# Patient Record
Sex: Female | Born: 1950 | Race: White | Hispanic: No | Marital: Married | Smoking: Never smoker
Health system: Southern US, Community
[De-identification: ages and names within clinical notes are randomized; demographics above are authoritative.]

## PROBLEM LIST (undated history)

## (undated) DIAGNOSIS — E785 Hyperlipidemia, unspecified: Secondary | ICD-10-CM

## (undated) DIAGNOSIS — I10 Essential (primary) hypertension: Secondary | ICD-10-CM

## (undated) DIAGNOSIS — E119 Type 2 diabetes mellitus without complications: Secondary | ICD-10-CM

## (undated) DIAGNOSIS — G629 Polyneuropathy, unspecified: Secondary | ICD-10-CM

## (undated) DIAGNOSIS — D649 Anemia, unspecified: Secondary | ICD-10-CM

## (undated) HISTORY — PX: ABDOMINAL HYSTERECTOMY: SHX81

---

## 2018-06-07 ENCOUNTER — Other Ambulatory Visit: Payer: Self-pay

## 2018-06-07 ENCOUNTER — Emergency Department (HOSPITAL_COMMUNITY): Payer: Medicare Other

## 2018-06-07 ENCOUNTER — Inpatient Hospital Stay (HOSPITAL_COMMUNITY)
Admission: EM | Admit: 2018-06-07 | Discharge: 2018-06-09 | DRG: 202 | Disposition: A | Discharge status: Home / Self Care | Payer: Medicare Other | Attending: Internal Medicine | Admitting: Internal Medicine

## 2018-06-07 ENCOUNTER — Encounter (HOSPITAL_COMMUNITY): Payer: Self-pay | Admitting: Emergency Medicine

## 2018-06-07 DIAGNOSIS — Z882 Allergy status to sulfonamides status: Secondary | ICD-10-CM

## 2018-06-07 DIAGNOSIS — E1165 Type 2 diabetes mellitus with hyperglycemia: Secondary | ICD-10-CM | POA: Diagnosis present

## 2018-06-07 DIAGNOSIS — D509 Iron deficiency anemia, unspecified: Secondary | ICD-10-CM | POA: Diagnosis not present

## 2018-06-07 DIAGNOSIS — Z79899 Other long term (current) drug therapy: Secondary | ICD-10-CM | POA: Diagnosis not present

## 2018-06-07 DIAGNOSIS — J205 Acute bronchitis due to respiratory syncytial virus: Secondary | ICD-10-CM | POA: Diagnosis present

## 2018-06-07 DIAGNOSIS — Z7984 Long term (current) use of oral hypoglycemic drugs: Secondary | ICD-10-CM | POA: Diagnosis not present

## 2018-06-07 DIAGNOSIS — E785 Hyperlipidemia, unspecified: Secondary | ICD-10-CM | POA: Diagnosis not present

## 2018-06-07 DIAGNOSIS — D649 Anemia, unspecified: Secondary | ICD-10-CM

## 2018-06-07 DIAGNOSIS — E1169 Type 2 diabetes mellitus with other specified complication: Secondary | ICD-10-CM | POA: Diagnosis not present

## 2018-06-07 DIAGNOSIS — Z6834 Body mass index (BMI) 34.0-34.9, adult: Secondary | ICD-10-CM | POA: Diagnosis not present

## 2018-06-07 DIAGNOSIS — R0902 Hypoxemia: Secondary | ICD-10-CM

## 2018-06-07 DIAGNOSIS — E538 Deficiency of other specified B group vitamins: Secondary | ICD-10-CM | POA: Diagnosis present

## 2018-06-07 DIAGNOSIS — J9811 Atelectasis: Secondary | ICD-10-CM | POA: Diagnosis present

## 2018-06-07 DIAGNOSIS — J209 Acute bronchitis, unspecified: Secondary | ICD-10-CM

## 2018-06-07 DIAGNOSIS — E669 Obesity, unspecified: Secondary | ICD-10-CM | POA: Diagnosis not present

## 2018-06-07 DIAGNOSIS — J9601 Acute respiratory failure with hypoxia: Secondary | ICD-10-CM | POA: Diagnosis present

## 2018-06-07 DIAGNOSIS — Z7982 Long term (current) use of aspirin: Secondary | ICD-10-CM

## 2018-06-07 DIAGNOSIS — I10 Essential (primary) hypertension: Secondary | ICD-10-CM

## 2018-06-07 DIAGNOSIS — J40 Bronchitis, not specified as acute or chronic: Secondary | ICD-10-CM

## 2018-06-07 HISTORY — DX: Hyperlipidemia, unspecified: E78.5

## 2018-06-07 HISTORY — DX: Type 2 diabetes mellitus without complications: E11.9

## 2018-06-07 HISTORY — DX: Polyneuropathy, unspecified: G62.9

## 2018-06-07 HISTORY — DX: Essential (primary) hypertension: I10

## 2018-06-07 HISTORY — DX: Anemia, unspecified: D64.9

## 2018-06-07 LAB — CBC WITH DIFFERENTIAL/PLATELET
Abs Immature Granulocytes: 0.08 10*3/uL — ABNORMAL HIGH (ref 0.00–0.07)
Basophils Absolute: 0 10*3/uL (ref 0.0–0.1)
Basophils Relative: 0 %
Eosinophils Absolute: 0 10*3/uL (ref 0.0–0.5)
Eosinophils Relative: 0 %
HCT: 24.5 % — ABNORMAL LOW (ref 36.0–46.0)
Hemoglobin: 7.6 g/dL — ABNORMAL LOW (ref 12.0–15.0)
Immature Granulocytes: 3 %
Lymphocytes Relative: 28 %
Lymphs Abs: 0.9 10*3/uL (ref 0.7–4.0)
MCH: 35.5 pg — ABNORMAL HIGH (ref 26.0–34.0)
MCHC: 31 g/dL (ref 30.0–36.0)
MCV: 114.5 fL — ABNORMAL HIGH (ref 80.0–100.0)
Monocytes Absolute: 0.2 10*3/uL (ref 0.1–1.0)
Monocytes Relative: 5 %
Neutro Abs: 2.1 10*3/uL (ref 1.7–7.7)
Neutrophils Relative %: 64 %
Platelets: 151 10*3/uL (ref 150–400)
RBC: 2.14 MIL/uL — ABNORMAL LOW (ref 3.87–5.11)
RDW: 17.8 % — ABNORMAL HIGH (ref 11.5–15.5)
WBC: 3.2 10*3/uL — ABNORMAL LOW (ref 4.0–10.5)
nRBC: 0 % (ref 0.0–0.2)

## 2018-06-07 LAB — RETICULOCYTES
Immature Retic Fract: 21.7 % — ABNORMAL HIGH (ref 2.3–15.9)
RBC.: 2.14 MIL/uL — ABNORMAL LOW (ref 3.87–5.11)
RETIC CT PCT: 0.5 % (ref 0.4–3.1)
Retic Count, Absolute: 10.9 10*3/uL — ABNORMAL LOW (ref 19.0–186.0)

## 2018-06-07 LAB — IRON AND TIBC
Iron: 29 ug/dL (ref 28–170)
Saturation Ratios: 11 % (ref 10.4–31.8)
TIBC: 254 ug/dL (ref 250–450)
UIBC: 225 ug/dL

## 2018-06-07 LAB — BASIC METABOLIC PANEL
Anion gap: 13 (ref 5–15)
BUN: 23 mg/dL (ref 8–23)
CO2: 22 mmol/L (ref 22–32)
Calcium: 8.7 mg/dL — ABNORMAL LOW (ref 8.9–10.3)
Chloride: 96 mmol/L — ABNORMAL LOW (ref 98–111)
Creatinine, Ser: 0.76 mg/dL (ref 0.44–1.00)
GFR calc Af Amer: >60 mL/min (ref >60)
GFR calc non Af Amer: >60 mL/min (ref >60)
Glucose, Bld: 216 mg/dL — ABNORMAL HIGH (ref 70–99)
Potassium: 4.2 mmol/L (ref 3.5–5.1)
Sodium: 131 mmol/L — ABNORMAL LOW (ref 135–145)

## 2018-06-07 LAB — FERRITIN: Ferritin: 178 ng/mL (ref 11–307)

## 2018-06-07 LAB — GLUCOSE, CAPILLARY
Glucose-Capillary: 235 mg/dL — ABNORMAL HIGH (ref 70–99)
Glucose-Capillary: 233 mg/dL — ABNORMAL HIGH (ref 70–99)

## 2018-06-07 LAB — TROPONIN I: Troponin I: <0.03 ng/mL (ref <0.03)

## 2018-06-07 LAB — INFLUENZA PANEL BY PCR (TYPE A & B)
Influenza A By PCR: NEGATIVE
Influenza B By PCR: NEGATIVE

## 2018-06-07 LAB — HEMOGLOBIN A1C
Hgb A1c MFr Bld: 8.5 % — ABNORMAL HIGH (ref 4.8–5.6)
Mean Plasma Glucose: 197.25 mg/dL

## 2018-06-07 LAB — FOLATE: Folate: 20.5 ng/mL (ref >5.9)

## 2018-06-07 LAB — POC OCCULT BLOOD, ED: Fecal Occult Bld: NEGATIVE

## 2018-06-07 LAB — VITAMIN B12: Vitamin B-12: 7 pg/mL — ABNORMAL LOW (ref 180–914)

## 2018-06-07 MED ORDER — HYDROCOD POLST-CPM POLST ER 10-8 MG/5ML PO SUER
5.0000 mL | Freq: Two times a day (BID) | ORAL | Status: DC | PRN
  Administered 2018-06-08: 5 mL via ORAL
  Filled 2018-06-07: qty 5

## 2018-06-07 MED ORDER — SODIUM CHLORIDE 0.9 % IV SOLN
INTRAVENOUS | Status: DC
  Administered 2018-06-07 – 2018-06-08 (×2): via INTRAVENOUS

## 2018-06-07 MED ORDER — ONDANSETRON HCL 4 MG/2ML IJ SOLN: 4.0000 mg | Freq: Four times a day (QID) | INTRAMUSCULAR | Status: DC | PRN

## 2018-06-07 MED ORDER — ACETAMINOPHEN 325 MG PO TABS: 650.0000 mg | ORAL_TABLET | Freq: Four times a day (QID) | ORAL | Status: DC | PRN

## 2018-06-07 MED ORDER — INSULIN ASPART 100 UNIT/ML ~~LOC~~ SOLN
3.0000 [IU] | Freq: Three times a day (TID) | SUBCUTANEOUS | Status: DC
  Administered 2018-06-07 – 2018-06-09 (×4): 3 [IU] via SUBCUTANEOUS

## 2018-06-07 MED ORDER — INSULIN ASPART 100 UNIT/ML ~~LOC~~ SOLN
0.0000 [IU] | Freq: Three times a day (TID) | SUBCUTANEOUS | Status: DC
  Administered 2018-06-07: 7 [IU] via SUBCUTANEOUS
  Administered 2018-06-08: 15 [IU] via SUBCUTANEOUS
  Administered 2018-06-08: 11 [IU] via SUBCUTANEOUS
  Administered 2018-06-09: 7 [IU] via SUBCUTANEOUS

## 2018-06-07 MED ORDER — ONDANSETRON HCL 4 MG PO TABS: 4.0000 mg | ORAL_TABLET | Freq: Four times a day (QID) | ORAL | Status: DC | PRN

## 2018-06-07 MED ORDER — BUDESONIDE 0.25 MG/2ML IN SUSP
0.2500 mg | Freq: Two times a day (BID) | RESPIRATORY_TRACT | Status: DC
  Administered 2018-06-07 – 2018-06-09 (×4): 0.25 mg via RESPIRATORY_TRACT
  Filled 2018-06-07 (×7): qty 2

## 2018-06-07 MED ORDER — ADULT MULTIVITAMIN W/MINERALS CH
1.0000 | ORAL_TABLET | Freq: Every day | ORAL | Status: DC
  Administered 2018-06-08 – 2018-06-09 (×2): 1 via ORAL
  Filled 2018-06-07 (×2): qty 1

## 2018-06-07 MED ORDER — IPRATROPIUM-ALBUTEROL 0.5-2.5 (3) MG/3ML IN SOLN
3.0000 mL | Freq: Once | RESPIRATORY_TRACT | Status: AC
  Administered 2018-06-07: 3 mL via RESPIRATORY_TRACT
  Filled 2018-06-07: qty 3

## 2018-06-07 MED ORDER — ACETAMINOPHEN 650 MG RE SUPP: 650.0000 mg | Freq: Four times a day (QID) | RECTAL | Status: DC | PRN

## 2018-06-07 MED ORDER — LEVALBUTEROL HCL 0.63 MG/3ML IN NEBU
0.6300 mg | INHALATION_SOLUTION | Freq: Four times a day (QID) | RESPIRATORY_TRACT | Status: DC
  Administered 2018-06-07 – 2018-06-08 (×3): 0.63 mg via RESPIRATORY_TRACT
  Filled 2018-06-07 (×3): qty 3

## 2018-06-07 MED ORDER — METOPROLOL TARTRATE 25 MG PO TABS
25.0000 mg | ORAL_TABLET | Freq: Every day | ORAL | Status: DC
  Administered 2018-06-08 – 2018-06-09 (×2): 25 mg via ORAL
  Filled 2018-06-07 (×2): qty 1

## 2018-06-07 MED ORDER — DM-GUAIFENESIN ER 30-600 MG PO TB12
1.0000 | ORAL_TABLET | Freq: Two times a day (BID) | ORAL | Status: DC
  Administered 2018-06-07 – 2018-06-09 (×5): 1 via ORAL
  Filled 2018-06-07 (×5): qty 1

## 2018-06-07 MED ORDER — ALBUTEROL SULFATE (2.5 MG/3ML) 0.083% IN NEBU
5.0000 mg | INHALATION_SOLUTION | Freq: Once | RESPIRATORY_TRACT | Status: AC
  Administered 2018-06-07: 5 mg via RESPIRATORY_TRACT
  Filled 2018-06-07: qty 6

## 2018-06-07 MED ORDER — LISINOPRIL 10 MG PO TABS
10.0000 mg | ORAL_TABLET | Freq: Every day | ORAL | Status: DC
  Administered 2018-06-08 – 2018-06-09 (×2): 10 mg via ORAL
  Filled 2018-06-07 (×3): qty 1

## 2018-06-07 MED ORDER — INSULIN ASPART 100 UNIT/ML ~~LOC~~ SOLN
0.0000 [IU] | Freq: Every day | SUBCUTANEOUS | Status: DC
  Administered 2018-06-07: 2 [IU] via SUBCUTANEOUS

## 2018-06-07 MED ORDER — METHYLPREDNISOLONE SODIUM SUCC 40 MG IJ SOLR
40.0000 mg | Freq: Two times a day (BID) | INTRAMUSCULAR | Status: DC
  Administered 2018-06-07 – 2018-06-08 (×2): 40 mg via INTRAVENOUS
  Filled 2018-06-07 (×2): qty 1

## 2018-06-07 MED ORDER — ALBUTEROL SULFATE (2.5 MG/3ML) 0.083% IN NEBU
2.5000 mg | INHALATION_SOLUTION | Freq: Once | RESPIRATORY_TRACT | Status: AC
  Administered 2018-06-07: 2.5 mg via RESPIRATORY_TRACT
  Filled 2018-06-07: qty 3

## 2018-06-07 NOTE — ED Notes (Signed)
CNA ambulated pt on room air, O2 sat dropped to 79%. Pt was returned to bed and placed back on 2L via [place]

## 2018-06-07 NOTE — ED Notes (Signed)
RT notified of orders 

## 2018-06-07 NOTE — ED Provider Notes (Signed)
[facility] EMERGENCY DEPARTMENT Provider Note   CSN: [id number] Arrival date & time: 06/07/18  1043     History   Chief Complaint Chief Complaint  Patient presents with  . Cough    HPI Kaitlin Vargas is a 68 y.o. female.  HPI  Pt was seen at 1115. Per pt, c/o gradual onset and worsening of persistent cough for the past 3 to 4 days. Has been associated with generalized body aches, fatigue, N/V, and sinus/nasal congestion. Pt states she was evaluated by her PMD on 06/02/2018, dx bronchitis, rx zpack and prednisone. Pt states she took the medications as prescribed with transient improvement. Denies CP/palpitations, no abd pain, no diarrhea, no black or blood in stools or emesis, no back pain, no rash, no fevers.   Past Medical History:  Diagnosis Date  . Anemia   . Diabetes mellitus without complication ([place])   . Hyperlipidemia   . Hypertension   . Peripheral neuropathy     Patient Active Problem List   Diagnosis Date Noted  . Acute hypoxemic respiratory failure ([place]) 06/07/2018  . Essential hypertension 06/07/2018  . Type 2 diabetes mellitus with hyperlipidemia ([place]) 06/07/2018  . Anemia 06/07/2018  . Acute bronchitis 06/07/2018    Past Surgical History:  Procedure Laterality Date  . ABDOMINAL HYSTERECTOMY       OB History    Gravida  2   Para  2   Term  2   Preterm      AB      Living  2     SAB      TAB      Ectopic      Multiple      Live Births               Home Medications    Prior to Admission medications   Medication Sig Start Date End Date Taking? Authorizing Provider  aspirin 81 MG chewable tablet Chew 81 mg by mouth once.    Yes [provider]  glipiZIDE (GLUCOTROL XL) 10 MG 24 hr tablet Take 10 mg by mouth 2 (two) times daily.  04/28/18  Yes [provider]  lisinopril (PRINIVIL,ZESTRIL) 20 MG tablet Take 10 mg by mouth daily.  02/06/18  Yes [provider]  metFORMIN (GLUCOPHAGE) 1000 MG tablet Take  1,000 mg by mouth 2 (two) times daily with a meal.  07/02/17 07/02/18 Yes [provider]  metoprolol tartrate (LOPRESSOR) 25 MG tablet Take 25 mg by mouth daily.  01/31/18  Yes [provider]  Multiple Vitamin (THERA) TABS Take 1 tablet by mouth daily.    Yes [provider]  pioglitazone (ACTOS) 15 MG tablet Take 15 mg by mouth daily.  04/28/18  Yes [provider]    Family History Family History  Problem Relation Age of Onset  . Cancer Mother   . Cancer Father     Social History Social History   Tobacco Use  . Smoking status: Never Smoker  . Smokeless tobacco: Never Used  Substance Use Topics  . Alcohol use: Never    Frequency: Never  . Drug use: Never     Allergies   Sulfa antibiotics   Review of Systems Review of Systems ROS: Statement: All systems negative except as marked or noted in the HPI; Constitutional: Negative for fever and chills. +generalized body aches, fatigue. ; ; Eyes: Negative for eye pain, redness and discharge. ; ; ENMT: Negative for ear pain, hoarseness, sore throat. +nasal  congestion, sinus pressure. ; ; Cardiovascular: Negative for chest pain, palpitations, diaphoresis, dyspnea and peripheral edema. ; ; Respiratory: +cough. Negative for wheezing and stridor. ; ; Gastrointestinal: Negative for nausea, vomiting, diarrhea, abdominal pain, blood in stool, hematemesis, jaundice and rectal bleeding. . ; ; Genitourinary: Negative for dysuria, flank pain and hematuria. ; ; Musculoskeletal: Negative for back pain and neck pain. Negative for swelling and trauma.; ; Skin: Negative for pruritus, rash, abrasions, blisters, bruising and skin lesion.; ; Neuro: Negative for headache, lightheadedness and neck stiffness. Negative for altered level of consciousness, altered mental status, extremity weakness, paresthesias, involuntary movement, seizure and syncope.       Physical Exam Updated Vital Signs BP 138/61   Pulse (!) 120   Temp  98.2 F (36.8 C) (Oral)   Resp (!) 26   Ht 4\' 10"  (1.473 m)   Wt 73.9 kg   SpO2 90%   BMI 34.07 kg/m   Patient Vitals for the past 24 hrs:  BP Temp Temp src Pulse Resp SpO2 Height Weight  06/07/18 1330 138/61 - - (!) 120 (!) 26 90 % - -  06/07/18 1300 140/72 - - (!) 119 (!) 25 95 % - -  06/07/18 1230 133/68 - - (!) 118 (!) 25 96 % - -  06/07/18 1148 - - - - - 100 % - -  06/07/18 1130 139/71 - - (!) 104 (!) 26 96 % - -  06/07/18 1103 124/65 98.2 F (36.8 C) Oral (!) 114 (!) 26 (!) 80 % 4\' 10"  (1.473 m) 73.9 kg     Physical Exam 1120: Physical examination:  Nursing notes reviewed; Vital signs and O2 SAT reviewed;  Constitutional: Well developed, Well nourished, Well hydrated, In no acute distress; Head:  Normocephalic, atraumatic; Eyes: EOMI, PERRL, No scleral icterus; ENMT: Mouth and pharynx normal, Mucous membranes moist. +edemetous nasal turbinates bilat with clear rhinorrhea. Mouth and pharynx without lesions. No tonsillar exudates. No intra-oral edema. No submandibular or sublingual edema. No hoarse voice, no drooling, no stridor. No pain with manipulation of larynx. No trismus. ; Neck: Supple, Full range of motion, No lymphadenopathy; Cardiovascular: Tachycardic rate and rhythm, No gallop; Respiratory: Breath sounds coarse & equal bilaterally, No wheezes.  Speaking full sentences with ease, Normal respiratory effort/excursion; Chest: Nontender, Movement normal; Abdomen: Soft, Nontender, Nondistended, Normal bowel sounds. Rectal exam performed w/permission of pt and ED RN chaperone present.  Anal tone normal.  Non-tender, soft brown stool in rectal vault, heme neg.  No fissures, no external hemorrhoids, no palp masses.; Genitourinary: No CVA tenderness; Extremities: Peripheral pulses normal, No tenderness, +tr pedal edema bilat. No calf asymmetry.; Neuro: AA&Ox3, Major CN grossly intact.  Speech clear. No gross focal motor or sensory deficits in extremities.; Skin: Color pale, Warm,  Dry.   ED Treatments / Results  Labs (all labs ordered are listed, but only abnormal results are displayed)   EKG EKG Interpretation  Date/Time:  Saturday June 07 2018 11:09:03 EST Ventricular Rate:  109 PR Interval:    QRS Duration: 83 QT Interval:  313 QTC Calculation: 422 R Axis:   -4 Text Interpretation:  Sinus tachycardia Low voltage, precordial leads Baseline wander No old tracing to compare Confirmed by Francine Graven [phone]) on 06/07/2018 11:28:16 AM   Radiology   Procedures Procedures (including critical care time)  Medications Ordered in ED Medications  ipratropium-albuterol (DUONEB) 0.5-2.5 (3) MG/3ML nebulizer solution 3 mL (has no administration in time range)  albuterol (PROVENTIL) (2.5 MG/3ML) 0.083% nebulizer solution 2.5  mg (has no administration in time range)  albuterol (PROVENTIL) (2.5 MG/3ML) 0.083% nebulizer solution 5 mg (5 mg Nebulization Given 06/07/18 1135)  ipratropium-albuterol (DUONEB) 0.5-2.5 (3) MG/3ML nebulizer solution 3 mL (3 mLs Nebulization Given 06/07/18 1135)     Initial Impression / Assessment and Plan / ED Course  I have reviewed the triage vital signs and the nursing notes.  Pertinent labs & imaging results that were available during my care of the patient were reviewed by me and considered in my medical decision making (see chart for details).  MDM Reviewed: previous chart, nursing note and vitals Reviewed previous: labs and ECG Interpretation: labs, ECG and x-ray Total time providing critical care: 30-74 minutes. This excludes time spent performing separately reportable procedures and services. Consults: admitting MD   CRITICAL CARE Performed by: Francine Graven Total critical care time: 35 minutes Critical care time was exclusive of separately billable procedures and treating other patients. Critical care was necessary to treat or prevent imminent or life-threatening deterioration. Critical care was time spent  personally by me on the following activities: development of treatment plan with patient and/or surrogate as well as nursing, discussions with consultants, evaluation of patient's response to treatment, examination of patient, obtaining history from patient or surrogate, ordering and performing treatments and interventions, ordering and review of laboratory studies, ordering and review of radiographic studies, pulse oximetry and re-evaluation of patient's condition.   Results for orders placed or performed during the hospital encounter of 06/07/18  Influenza panel by PCR (type A & B)  Result Value Ref Range   Influenza A By PCR NEGATIVE NEGATIVE   Influenza B By PCR NEGATIVE NEGATIVE  Basic metabolic panel  Result Value Ref Range   Sodium 131 (L) 135 - 145 mmol/L   Potassium 4.2 3.5 - 5.1 mmol/L   Chloride 96 (L) 98 - 111 mmol/L   CO2 22 22 - 32 mmol/L   Glucose, Bld 216 (H) 70 - 99 mg/dL   BUN 23 8 - 23 mg/dL   Creatinine, Ser 0.76 0.44 - 1.00 mg/dL   Calcium 8.7 (L) 8.9 - 10.3 mg/dL   GFR calc non Af Amer >60 >60 mL/min   GFR calc Af Amer >60 >60 mL/min   Anion gap 13 5 - 15  CBC with Differential  Result Value Ref Range   WBC 3.2 (L) 4.0 - 10.5 K/uL   RBC 2.14 (L) 3.87 - 5.11 MIL/uL   Hemoglobin 7.6 (L) 12.0 - 15.0 g/dL   HCT 24.5 (L) 36.0 - 46.0 %   MCV 114.5 (H) 80.0 - 100.0 fL   MCH 35.5 (H) 26.0 - 34.0 pg   MCHC 31.0 30.0 - 36.0 g/dL   RDW 17.8 (H) 11.5 - 15.5 %   Platelets 151 150 - 400 K/uL   nRBC 0.0 0.0 - 0.2 %   Neutrophils Relative % 64 %   Neutro Abs 2.1 1.7 - 7.7 K/uL   Lymphocytes Relative 28 %   Lymphs Abs 0.9 0.7 - 4.0 K/uL   Monocytes Relative 5 %   Monocytes Absolute 0.2 0.1 - 1.0 K/uL   Eosinophils Relative 0 %   Eosinophils Absolute 0.0 0.0 - 0.5 K/uL   Basophils Relative 0 %   Basophils Absolute 0.0 0.0 - 0.1 K/uL   WBC Morphology MACROCYTES SEEN    Immature Granulocytes 3 %   Abs Immature Granulocytes 0.08 (H) 0.00 - 0.07 K/uL  Troponin I - Once   Result Value Ref Range   Troponin  I <0.03 <0.03 ng/mL  POC occult blood, ED  Result Value Ref Range   Fecal Occult Bld NEGATIVE NEGATIVE   Dg Chest 2 View Result Date: 06/07/2018 CLINICAL DATA:  Cough and shortness of breath EXAM: CHEST - 2 VIEW COMPARISON:  None. FINDINGS: There is atelectatic change in the right mid lung and left base regions. There is no edema or consolidation. Heart is borderline enlarged with pulmonary vascularity normal. No adenopathy. There is calcification in the left anterior descending coronary artery. There is degenerative change in the thoracic spine. IMPRESSION: Areas of atelectatic change. No edema or consolidation. Heart borderline enlarged with foci coronary artery calcification. No adenopathy. Electronically Signed   By: Lowella Grip III M.D.   On: 06/07/2018 11:49    Care Everywhere records reviewed:  04/14/2018: H/H = 9.4/27.9 10/09/2017:   H/H = 11.4/34 12/06/2016:  H/H  = 12.1/36.1    1350:  Pt hypoxic on arrival, Sats 80% R/A. O2 2L N/C applied with Sats improving to low-mid 90's and short neb given with improvement of Sats to 95%< while at rest. H/H lower than previous (above) and pt recalls being told by her PMD last year that her "blood count was lower than it had been." Pt does not recall discussing any plans to pursue this further. Pt ambulated very short distance with O2 Sats dropping to 79% R/A, and pt c/o feeling SOB.  While rolling pt to the side to perform rectal exam, pt's HR increased to 120's and Sats again decreased (into 80's R/A). Stool heme negative. Pt placed upright, O2 2L N/C applied with Sats again improving into 90's. 2nd short neb ordered.  Dx and testing d/w pt and family.  Questions answered.  Verb understanding, agreeable to admit.  T/C returned from [facility] Dr. Manuella Ghazi, case discussed, including:  HPI, pertinent PM/SHx, VS/PE, dx testing, ED course and treatment:  Agreeable to admit.      Final Clinical Impressions(s) / ED  Diagnoses   Final diagnoses:  None    ED Discharge Orders    None       Francine Graven, DO 06/11/18 1217

## 2018-06-07 NOTE — H&P (Signed)
History and Physical    Kaitlin Vargas ZHG:[id number] DOB: 05-09-1951 DOA: 06/07/2018  PCP: Collene Schlichter., MD   Patient coming from: Home  Chief Complaint: Cough/congestion/N/V/aches  HPI: Kaitlin Vargas is a 68 y.o. female with medical history significant for type 2 diabetes, dyslipidemia, hypertension, and anemia who presented to the ED with worsening nonproductive cough as well as body aches, nausea, 2 episodes of emesis and worsening fatigue.  She states that her symptoms initially began a little over a week ago at which point her PCP diagnosed her with a bronchitis and gave her a Z-Pak as well as some prednisone which she finished about 5 days ago.  Her symptoms hardly improved and returned about 3 days ago.  She denies any fevers or chills or diarrhea.  No blood in the stool, melena, or hematemesis noted.  She did have a sick contact with similar symptoms which would be her daughter.  She presently denies any aggravating or alleviating factors.   ED Course: Vital signs are stable but do demonstrate sinus tachycardia is confirmed on EKG.  This appears to have started shortly after her breathing treatments that were given in the ED.  Her labs demonstrate a low hemoglobin of 7.6, but stool occult is negative.  Sodium is 131 and glucose is 216.  EKG with sinus tachycardia noted.  Two-view chest x-ray with areas of patchy atelectasis, but no edema or consolidation.  Influenza swab is negative.  She is desaturating on room air to a level of approximately 80% and requires 2 L nasal cannula which probably brings up her oxygenation in the high 90th percentile.  Review of Systems: All others reviewed and otherwise negative.  Past Medical History:  Diagnosis Date  . Anemia   . Diabetes mellitus without complication ([place])   . Hyperlipidemia   . Hypertension   . Peripheral neuropathy     Past Surgical History:  Procedure Laterality Date  . ABDOMINAL HYSTERECTOMY       reports that she has never  smoked. She has never used smokeless tobacco. She reports that she does not drink alcohol or use drugs.  Allergies  Allergen Reactions  . Sulfa Antibiotics Rash    Family History  Problem Relation Age of Onset  . Cancer Mother   . Cancer Father     Prior to Admission medications   Medication Sig Start Date End Date Taking? Authorizing Provider  aspirin 81 MG chewable tablet Chew 81 mg by mouth once.    Yes [provider]  glipiZIDE (GLUCOTROL XL) 10 MG 24 hr tablet Take 10 mg by mouth 2 (two) times daily.  04/28/18  Yes [provider]  lisinopril (PRINIVIL,ZESTRIL) 20 MG tablet Take 10 mg by mouth daily.  02/06/18  Yes [provider]  metFORMIN (GLUCOPHAGE) 1000 MG tablet Take 1,000 mg by mouth 2 (two) times daily with a meal.  07/02/17 07/02/18 Yes [provider]  metoprolol tartrate (LOPRESSOR) 25 MG tablet Take 25 mg by mouth daily.  01/31/18  Yes [provider]  Multiple Vitamin (THERA) TABS Take 1 tablet by mouth daily.    Yes [provider]  pioglitazone (ACTOS) 15 MG tablet Take 15 mg by mouth daily.  04/28/18  Yes [provider]    Physical Exam: Vitals:   06/07/18 1230 06/07/18 1300 06/07/18 1330 06/07/18 1419  BP: 133/68 140/72 138/61   Pulse: (!) 118 (!) 119 (!) 120   Resp: (!) 25 (!) 25 (!) 26   Temp:  TempSrc:      SpO2: 96% 95% 90% 92%  Weight:      Height:        Constitutional: NAD, calm, comfortable currently receiving breathing treatment Vitals:   06/07/18 1230 06/07/18 1300 06/07/18 1330 06/07/18 1419  BP: 133/68 140/72 138/61   Pulse: (!) 118 (!) 119 (!) 120   Resp: (!) 25 (!) 25 (!) 26   Temp:      TempSrc:      SpO2: 96% 95% 90% 92%  Weight:      Height:       Eyes: lids and conjunctivae normal ENMT: Mucous membranes are moist.  Neck: normal, supple Respiratory: clear to auscultation bilaterally. Normal respiratory effort. No accessory muscle use.  Active wheezing noted  bilaterally. Cardiovascular: Regular rate and rhythm, no murmurs. No extremity edema. Abdomen: no tenderness, no distention. Bowel sounds positive.  Musculoskeletal:  No joint deformity upper and lower extremities.   Skin: no rashes, lesions, ulcers.  Psychiatric: Normal judgment and insight. Alert and oriented x 3. Normal mood.   Labs on Admission: I have personally reviewed following labs and imaging studies  CBC: Recent Labs  Lab 06/07/18 1200  WBC 3.2*  NEUTROABS 2.1  HGB 7.6*  HCT 24.5*  MCV 114.5*  PLT 128   Basic Metabolic Panel: Recent Labs  Lab 06/07/18 1200  NA 131*  K 4.2  CL 96*  CO2 22  GLUCOSE 216*  BUN 23  CREATININE 0.76  CALCIUM 8.7*   GFR: Estimated Creatinine Clearance: 56.7 mL/min (by C-G formula based on SCr of 0.76 mg/dL). Liver Function Tests: No results for input(s): AST, ALT, ALKPHOS, BILITOT, PROT, ALBUMIN in the last 168 hours. No results for input(s): LIPASE, AMYLASE in the last 168 hours. No results for input(s): AMMONIA in the last 168 hours. Coagulation Profile: No results for input(s): INR, PROTIME in the last 168 hours. Cardiac Enzymes: Recent Labs  Lab 06/07/18 1200  TROPONINI <0.03   BNP (last 3 results) No results for input(s): PROBNP in the last 8760 hours. HbA1C: No results for input(s): HGBA1C in the last 72 hours. CBG: No results for input(s): GLUCAP in the last 168 hours. Lipid Profile: No results for input(s): CHOL, HDL, LDLCALC, TRIG, CHOLHDL, LDLDIRECT in the last 72 hours. Thyroid Function Tests: No results for input(s): TSH, T4TOTAL, FREET4, T3FREE, THYROIDAB in the last 72 hours. Anemia Panel: No results for input(s): VITAMINB12, FOLATE, FERRITIN, TIBC, IRON, RETICCTPCT in the last 72 hours. Urine analysis: No results found for: COLORURINE, APPEARANCEUR, LABSPEC, PHURINE, GLUCOSEU, HGBUR, BILIRUBINUR, KETONESUR, PROTEINUR, UROBILINOGEN, NITRITE, LEUKOCYTESUR  Radiological Exams on Admission: Dg Chest 2  View  Result Date: 06/07/2018 CLINICAL DATA:  Cough and shortness of breath EXAM: CHEST - 2 VIEW COMPARISON:  None. FINDINGS: There is atelectatic change in the right mid lung and left base regions. There is no edema or consolidation. Heart is borderline enlarged with pulmonary vascularity normal. No adenopathy. There is calcification in the left anterior descending coronary artery. There is degenerative change in the thoracic spine. IMPRESSION: Areas of atelectatic change. No edema or consolidation. Heart borderline enlarged with foci coronary artery calcification. No adenopathy. Electronically Signed   By: Lowella Grip III M.D.   On: 06/07/2018 11:49    EKG: Independently reviewed.  Sinus tachycardia at 109 bpm with low voltage.  Assessment/Plan Principal Problem:   Acute hypoxemic respiratory failure (HCC) Active Problems:   Essential hypertension   Type 2 diabetes mellitus with hyperlipidemia (HCC)   Anemia  Acute bronchitis    1. Acute hypoxemic respiratory failure secondary to atelectasis in the setting of obesity and bronchitis.  Continue to wean oxygen and plan to have patient up in chair with incentive spirometry.  Start IV Solu-Medrol 40 mg twice daily with Pulmicort breathing treatments as well. 2. Acute bronchitis.  This is likely a viral nature with recent sick contact.  Will check respiratory panel and maintain on steroids as noted above as well as breathing treatments and antitussives.  Patient has completed a recent course of antibiotics and does not have an apparent bacterial infection and therefore, will avoid antibiotics.  Will check urine strep pneumonia as well as Legionella. 3. Worsening anemia.  Downtrend has been noted since May of last year where hemoglobin was previously 11.  She has been told by her PCP in the past that she does have iron deficiency anemia, but has never been prescribed iron.  Will check anemia panel and prescribe iron or B12 as needed.  Stool FOBT  is currently negative and therefore, there is no indication of GI bleed at this time.  Avoid home aspirin for now. 4. Intractable nausea and vomiting.  Maintain on IV fluid with Zofran as needed. 5. Type 2 diabetes.  Will avoid oral agents until dietary intake has stabilized.  Will maintain on high sliding scale insulin to manage hyperglycemia especially in the setting of steroid use. 6. Hypertension.  Currently well controlled.  Will resume home lisinopril and metoprolol.   DVT prophylaxis: SCDs Code Status: Full code Family Communication: None at bedside Disposition Plan: Admit to treat hypoxemia and work-up anemia as well as because of bronchitis Consults called: None Admission status: Inpatient, telemetry   [facility] Pager [phone]  If 7PM-7AM, please contact night-coverage [email or web address] Password [email or web address]  06/07/2018, 2:22 PM

## 2018-06-07 NOTE — ED Triage Notes (Signed)
Patient c/o congested, nonproductive cough, body aches, nausea, vomiting, and fatigue. Per patient seen by PCP and was given prednisone and z-pack in which she finished 5 days ago and symptoms where improved but returned 3 days ago. Denies any diarrhea ot fevers. O2 sat 80% on room air.

## 2018-06-08 DIAGNOSIS — J9601 Acute respiratory failure with hypoxia: Secondary | ICD-10-CM | POA: Diagnosis not present

## 2018-06-08 DIAGNOSIS — J205 Acute bronchitis due to respiratory syncytial virus: Secondary | ICD-10-CM | POA: Diagnosis not present

## 2018-06-08 LAB — BASIC METABOLIC PANEL
Anion gap: 9 (ref 5–15)
BUN: 20 mg/dL (ref 8–23)
CHLORIDE: 102 mmol/L (ref 98–111)
CO2: 24 mmol/L (ref 22–32)
Calcium: 8.6 mg/dL — ABNORMAL LOW (ref 8.9–10.3)
Creatinine, Ser: 0.62 mg/dL (ref 0.44–1.00)
GFR calc Af Amer: >60 mL/min (ref >60)
GFR calc non Af Amer: >60 mL/min (ref >60)
Glucose, Bld: 263 mg/dL — ABNORMAL HIGH (ref 70–99)
Potassium: 4.6 mmol/L (ref 3.5–5.1)
Sodium: 135 mmol/L (ref 135–145)

## 2018-06-08 LAB — RESPIRATORY PANEL BY PCR
Adenovirus: NOT DETECTED
Bordetella pertussis: NOT DETECTED
CORONAVIRUS HKU1-RVPPCR: NOT DETECTED
Chlamydophila pneumoniae: NOT DETECTED
Coronavirus 229E: NOT DETECTED
Coronavirus NL63: NOT DETECTED
Coronavirus OC43: NOT DETECTED
Influenza A: NOT DETECTED
Influenza B: NOT DETECTED
Metapneumovirus: NOT DETECTED
Mycoplasma pneumoniae: NOT DETECTED
PARAINFLUENZA VIRUS 3-RVPPCR: NOT DETECTED
Parainfluenza Virus 1: NOT DETECTED
Parainfluenza Virus 2: NOT DETECTED
Parainfluenza Virus 4: NOT DETECTED
Respiratory Syncytial Virus: DETECTED — AB
Rhinovirus / Enterovirus: NOT DETECTED

## 2018-06-08 LAB — CBC
HEMATOCRIT: 20.9 % — AB (ref 36.0–46.0)
Hemoglobin: 6.5 g/dL — CL (ref 12.0–15.0)
MCH: 35.7 pg — ABNORMAL HIGH (ref 26.0–34.0)
MCHC: 31.1 g/dL (ref 30.0–36.0)
MCV: 114.8 fL — ABNORMAL HIGH (ref 80.0–100.0)
NRBC: 0.8 % — AB (ref 0.0–0.2)
Platelets: 123 10*3/uL — ABNORMAL LOW (ref 150–400)
RBC: 1.82 MIL/uL — ABNORMAL LOW (ref 3.87–5.11)
RDW: 18 % — ABNORMAL HIGH (ref 11.5–15.5)
WBC: 2.7 10*3/uL — ABNORMAL LOW (ref 4.0–10.5)

## 2018-06-08 LAB — GLUCOSE, CAPILLARY
Glucose-Capillary: 258 mg/dL — ABNORMAL HIGH (ref 70–99)
Glucose-Capillary: 305 mg/dL — ABNORMAL HIGH (ref 70–99)
Glucose-Capillary: 37 mg/dL — CL (ref 70–99)
Glucose-Capillary: 53 mg/dL — ABNORMAL LOW (ref 70–99)
Glucose-Capillary: 104 mg/dL — ABNORMAL HIGH (ref 70–99)
Glucose-Capillary: 118 mg/dL — ABNORMAL HIGH (ref 70–99)

## 2018-06-08 LAB — ABO/RH: ABO/RH(D): B POS

## 2018-06-08 LAB — PREPARE RBC (CROSSMATCH)

## 2018-06-08 LAB — HEMOGLOBIN AND HEMATOCRIT, BLOOD
HCT: 28 % — ABNORMAL LOW (ref 36.0–46.0)
Hemoglobin: 9.1 g/dL — ABNORMAL LOW (ref 12.0–15.0)

## 2018-06-08 MED ORDER — SODIUM CHLORIDE 0.9% IV SOLUTION
Freq: Once | INTRAVENOUS | Status: AC
  Administered 2018-06-08: 12:00:00 via INTRAVENOUS

## 2018-06-08 MED ORDER — LEVALBUTEROL HCL 0.63 MG/3ML IN NEBU: 0.6300 mg | INHALATION_SOLUTION | Freq: Four times a day (QID) | RESPIRATORY_TRACT | Status: DC | PRN

## 2018-06-08 MED ORDER — PREDNISONE 20 MG PO TABS
40.0000 mg | ORAL_TABLET | Freq: Every day | ORAL | Status: DC
  Administered 2018-06-09: 40 mg via ORAL
  Filled 2018-06-08: qty 2

## 2018-06-08 MED ORDER — CYANOCOBALAMIN 1000 MCG/ML IJ SOLN
1000.0000 ug | Freq: Once | INTRAMUSCULAR | Status: AC
  Administered 2018-06-08: 1000 ug via INTRAMUSCULAR
  Filled 2018-06-08: qty 1

## 2018-06-08 NOTE — Progress Notes (Signed)
RSV positive, notified Dr. Manuella Ghazi. Droplet precautions in place.

## 2018-06-08 NOTE — Progress Notes (Signed)
CRITICAL VALUE ALERT  Critical Value: hemoglobin 6.5  Date & Time Notied:  06/08/18 0710  Provider Notified:Shah  Orders Received/Actions taken: awaiting orders

## 2018-06-08 NOTE — Progress Notes (Signed)
PROGRESS NOTE    Kaitlin Vargas  WUX:[id number] DOB: [birth year] DOA: 06/07/2018 PCP: Collene Schlichter., MD   Brief Narrative:  Per HPI: Kaitlin Vargas is a 68 y.o. female with medical history significant for type 2 diabetes, dyslipidemia, hypertension, and anemia who presented to the ED with worsening nonproductive cough as well as body aches, nausea, 2 episodes of emesis and worsening fatigue.  She states that her symptoms initially began a little over a week ago at which point her PCP diagnosed her with a bronchitis and gave her a Z-Pak as well as some prednisone which she finished about 5 days ago.  Her symptoms hardly improved and returned about 3 days ago.  She denies any fevers or chills or diarrhea.  No blood in the stool, melena, or hematemesis noted.  She did have a sick contact with similar symptoms which would be her daughter.  She presently denies any aggravating or alleviating factors.  Patient was admitted for acute hypoxemic respiratory failure in the setting of acute bronchitis and was also noted to have some worsening anemia with no overt bleeding noted.  She is improving with symptomatic treatment and will require blood transfusion today.  She is noted to have vitamin B12 deficiency.  Assessment & Plan:   Principal Problem:   Acute hypoxemic respiratory failure (HCC) Active Problems:   Essential hypertension   Type 2 diabetes mellitus with hyperlipidemia (HCC)   Anemia   Acute bronchitis  1. Acute hypoxemic respiratory failure secondary to atelectasis in the setting of obesity and bronchitis.  Continue to wean oxygen and plan to have patient up in chair with incentive spirometry.  We will continue Pulmicort, but discontinue IV Solu-Medrol and placed on oral prednisone.  Change breathing treatments to as needed. 2. Acute bronchitis-secondary to RSV.   Continue breathing treatments as needed and antitussives.  Patient has completed a recent course of antibiotics and does not require any  antibiotics at this time. Will check urine strep pneumonia as well as Legionella. 3. Worsening anemia with noted vitamin B12 deficiency.  Downtrend has been noted since May of last year where hemoglobin was previously 11.  She has been told by her PCP in the past that she does have iron deficiency anemia, but has never been prescribed iron.    No significant iron deficiency currently noted or overt bleeding.  Stool occult is negative.  We will plan to replace B12 and transfuse 2 units of PRBCs today and follow CBC in a.m.  Will schedule outpatient GI appointment for endoscopy in the near future. 4. Intractable nausea and vomiting.    This has resolved and patient is tolerating diet.  Zofran will be ongoing as needed and discontinue IV fluid. 5. Type 2 diabetes.  Will avoid oral agents until dietary intake has stabilized.  Will maintain on high sliding scale insulin to manage hyperglycemia especially in the setting of steroid use. 6. Hypertension.  Currently well controlled.  Will continue home lisinopril and metoprolol.  DVT prophylaxis: SCDs Code Status: Full code Family Communication: None at bedside Disposition Plan: Continue to wean oxygen and plan to transfuse PRBC today.  She is noted to have RSV bronchitis.   Consultants:   None  Procedures:   None  Antimicrobials:   None   Subjective: Patient seen and evaluated today with no new acute complaints or concerns. No acute concerns or events noted overnight.  She is overall feeling much better today and denies any overt bleeding.  She has much less  shortness of breath.  Objective: Vitals:   06/08/18 0500 06/08/18 0845 06/08/18 0853 06/08/18 0949  BP: (!) 142/92   135/73  Pulse: (!) 116     Resp: 20     Temp: 97.8 F (36.6 C)     TempSrc: Oral     SpO2: 100% 98% 100%   Weight:      Height:        Intake/Output Summary (Last 24 hours) at 06/08/2018 1206 Last data filed at 06/08/2018 0828 Gross per 24 hour  Intake 1549.3 ml    Output -  Net 1549.3 ml   Filed Weights   06/07/18 1103 06/07/18 1609  Weight: 73.9 kg 72.4 kg    Examination:  General exam: Appears calm and comfortable  Respiratory system: Clear to auscultation. Respiratory effort normal.  On 2 L nasal cannula with very minimal wheezing. Cardiovascular system: S1 & S2 heard, RRR. No JVD, murmurs, rubs, gallops or clicks. No pedal edema. Gastrointestinal system: Abdomen is nondistended, soft and nontender. No organomegaly or masses felt. Normal bowel sounds heard. Central nervous system: Alert and oriented. No focal neurological deficits. Extremities: Symmetric 5 x 5 power. Skin: No rashes, lesions or ulcers Psychiatry: Judgement and insight appear normal. Mood & affect appropriate.     Data Reviewed: I have personally reviewed following labs and imaging studies  CBC: Recent Labs  Lab 06/07/18 1200 06/08/18 0548  WBC 3.2* 2.7*  NEUTROABS 2.1  --   HGB 7.6* 6.5*  HCT 24.5* 20.9*  MCV 114.5* 114.8*  PLT 151 431*   Basic Metabolic Panel: Recent Labs  Lab 06/07/18 1200 06/08/18 0548  NA 131* 135  K 4.2 4.6  CL 96* 102  CO2 22 24  GLUCOSE 216* 263*  BUN 23 20  CREATININE 0.76 0.62  CALCIUM 8.7* 8.6*   GFR: Estimated Creatinine Clearance: 57.5 mL/min (by C-G formula based on SCr of 0.62 mg/dL). Liver Function Tests: No results for input(s): AST, ALT, ALKPHOS, BILITOT, PROT, ALBUMIN in the last 168 hours. No results for input(s): LIPASE, AMYLASE in the last 168 hours. No results for input(s): AMMONIA in the last 168 hours. Coagulation Profile: No results for input(s): INR, PROTIME in the last 168 hours. Cardiac Enzymes: Recent Labs  Lab 06/07/18 1200  TROPONINI <0.03   BNP (last 3 results) No results for input(s): PROBNP in the last 8760 hours. HbA1C: Recent Labs    06/07/18 1209  HGBA1C 8.5*   CBG: Recent Labs  Lab 06/07/18 1641 06/07/18 2045 06/08/18 0738 06/08/18 1115  GLUCAP 235* 233* 258* 305*   Lipid  Profile: No results for input(s): CHOL, HDL, LDLCALC, TRIG, CHOLHDL, LDLDIRECT in the last 72 hours. Thyroid Function Tests: No results for input(s): TSH, T4TOTAL, FREET4, T3FREE, THYROIDAB in the last 72 hours. Anemia Panel: Recent Labs    06/07/18 1209 06/07/18 1525  VITAMINB12 7*  --   FOLATE  --  20.5  FERRITIN 178  --   TIBC 254  --   IRON 29  --   RETICCTPCT 0.5  --    Sepsis Labs: No results for input(s): PROCALCITON, LATICACIDVEN in the last 168 hours.  Recent Results (from the past 240 hour(s))  Respiratory Panel by PCR     Status: Abnormal   Collection Time: 06/07/18  2:36 PM  Result Value Ref Range Status   Adenovirus NOT DETECTED NOT DETECTED Final   Coronavirus 229E NOT DETECTED NOT DETECTED Final   Coronavirus HKU1 NOT DETECTED NOT DETECTED Final   Coronavirus  NL63 NOT DETECTED NOT DETECTED Final   Coronavirus OC43 NOT DETECTED NOT DETECTED Final   Metapneumovirus NOT DETECTED NOT DETECTED Final   Rhinovirus / Enterovirus NOT DETECTED NOT DETECTED Final   Influenza A NOT DETECTED NOT DETECTED Final   Influenza B NOT DETECTED NOT DETECTED Final   Parainfluenza Virus 1 NOT DETECTED NOT DETECTED Final   Parainfluenza Virus 2 NOT DETECTED NOT DETECTED Final   Parainfluenza Virus 3 NOT DETECTED NOT DETECTED Final   Parainfluenza Virus 4 NOT DETECTED NOT DETECTED Final   Respiratory Syncytial Virus DETECTED (A) NOT DETECTED Final    Comment: CRITICAL RESULT CALLED TO, READ BACK BY AND VERIFIED WITH: K. GRAVES,RN 0245 06/08/2018 T. TYSOR    Bordetella pertussis NOT DETECTED NOT DETECTED Final   Chlamydophila pneumoniae NOT DETECTED NOT DETECTED Final   Mycoplasma pneumoniae NOT DETECTED NOT DETECTED Final         Radiology Studies: Dg Chest 2 View  Result Date: 06/07/2018 CLINICAL DATA:  Cough and shortness of breath EXAM: CHEST - 2 VIEW COMPARISON:  None. FINDINGS: There is atelectatic change in the right mid lung and left base regions. There is no edema or  consolidation. Heart is borderline enlarged with pulmonary vascularity normal. No adenopathy. There is calcification in the left anterior descending coronary artery. There is degenerative change in the thoracic spine. IMPRESSION: Areas of atelectatic change. No edema or consolidation. Heart borderline enlarged with foci coronary artery calcification. No adenopathy. Electronically Signed   By: Lowella Grip III M.D.   On: 06/07/2018 11:49        Scheduled Meds: . sodium chloride   Intravenous Once  . budesonide (PULMICORT) nebulizer solution  0.25 mg Nebulization BID  . dextromethorphan-guaiFENesin  1 tablet Oral BID  . insulin aspart  0-20 Units Subcutaneous TID WC  . insulin aspart  0-5 Units Subcutaneous QHS  . insulin aspart  3 Units Subcutaneous TID WC  . lisinopril  10 mg Oral Daily  . metoprolol tartrate  25 mg Oral Daily  . multivitamin with minerals  1 tablet Oral Daily  . [START ON 06/09/2018] predniSONE  40 mg Oral Q breakfast   Continuous Infusions:   LOS: 1 day    Time spent: 30 minutes    Jaedyn Marrufo Darleen Crocker, DO [facility] Pager [phone]  If 7PM-7AM, please contact night-coverage [email or web address] Password [facility] 06/08/2018, 12:06 PM

## 2018-06-09 DIAGNOSIS — J9601 Acute respiratory failure with hypoxia: Secondary | ICD-10-CM | POA: Diagnosis not present

## 2018-06-09 DIAGNOSIS — J205 Acute bronchitis due to respiratory syncytial virus: Secondary | ICD-10-CM | POA: Diagnosis not present

## 2018-06-09 LAB — TYPE AND SCREEN
ABO/RH(D): B POS
Antibody Screen: NEGATIVE
Unit division: 0
Unit division: 0

## 2018-06-09 LAB — CBC
HCT: 33.4 % — ABNORMAL LOW (ref 36.0–46.0)
Hemoglobin: 10.7 g/dL — ABNORMAL LOW (ref 12.0–15.0)
MCH: 33.4 pg (ref 26.0–34.0)
MCHC: 32 g/dL (ref 30.0–36.0)
MCV: 104.4 fL — ABNORMAL HIGH (ref 80.0–100.0)
Platelets: 149 10*3/uL — ABNORMAL LOW (ref 150–400)
RBC: 3.2 MIL/uL — AB (ref 3.87–5.11)
RDW: 21.8 % — ABNORMAL HIGH (ref 11.5–15.5)
WBC: 4 10*3/uL (ref 4.0–10.5)
nRBC: 0.8 % — ABNORMAL HIGH (ref 0.0–0.2)

## 2018-06-09 LAB — BPAM RBC
Blood Product Expiration Date: 202002222359
Blood Product Expiration Date: 202002222359
ISSUE DATE / TIME: 202001191157
ISSUE DATE / TIME: 202001191535
UNIT TYPE AND RH: 5100
Unit Type and Rh: 5100

## 2018-06-09 LAB — HIV ANTIBODY (ROUTINE TESTING W REFLEX): HIV Screen 4th Generation wRfx: NONREACTIVE

## 2018-06-09 MED ORDER — PANTOPRAZOLE SODIUM 40 MG PO TBEC: 40.0000 mg | DELAYED_RELEASE_TABLET | Freq: Every day | ORAL | 1 refills | Status: DC

## 2018-06-09 MED ORDER — VITAMIN B-12 1000 MCG PO TABS: 1000.0000 ug | ORAL_TABLET | Freq: Every day | ORAL | 0 refills | Status: DC

## 2018-06-09 MED ORDER — ALBUTEROL SULFATE HFA 108 (90 BASE) MCG/ACT IN AERS: 2.0000 | INHALATION_SPRAY | Freq: Four times a day (QID) | RESPIRATORY_TRACT | 2 refills | Status: DC | PRN

## 2018-06-09 MED ORDER — PREDNISONE 20 MG PO TABS: 40.0000 mg | ORAL_TABLET | Freq: Every day | ORAL | 0 refills | Status: AC

## 2018-06-09 MED ORDER — DM-GUAIFENESIN ER 30-600 MG PO TB12: 1.0000 | ORAL_TABLET | Freq: Two times a day (BID) | ORAL | 0 refills | Status: DC

## 2018-06-09 NOTE — Progress Notes (Signed)
Inpatient Diabetes Program Recommendations  AACE/ADA: New Consensus Statement on Inpatient Glycemic Control (2015)  Target Ranges:  Prepandial:   less than 140 mg/dL      Peak postprandial:   less than 180 mg/dL (1-2 hours)      Critically ill patients:  140 - 180 mg/dL   Results for [name], [name] (MRN [id number]) as of 06/09/2018 08:54  Ref. Range 06/08/2018 07:38 06/08/2018 11:15 06/08/2018 16:18 06/08/2018 18:50 06/08/2018 19:35 06/08/2018 21:29  Glucose-Capillary Latest Ref Range: 70 - 99 mg/dL 258 (H)  14 units NOVOLOG  305 (H)  18 units NOVOLOG  37 (LL) 53 (L) 104 (H) 118 (H)    Admit with: Acute hypoxemic respiratory failure secondary to atelectasis in the setting of obesity and bronchitis  History: DM  Home DM Meds: Glipizide 10 mg BID       Metformin 1000 mg BID       Actos 15 mg Daily  Current Orders: Novolog Resistant Correction Scale/ SSI (0-20 units) TID AC + HS      Novolog 3 units TID with meals     Solumedrol stopped--Last dose given yesterday at 5am.  Now getting Prednisone 40 mg Daily.    MD- Note patient with severe Hypoglycemia yesterday at 4pm after receiving 18 units Novolog at 1pm (15 units SSI + 3 units meal coverage).  Please consider reducing Novolog SSI to Sensitive scale (0-9 units) TID AC + HS now that Solumedrol stopped.  Please continue Novolog 3 units Meal Coverage while patient remains on Prednisone      --Will follow patient during hospitalization--  Wyn Quaker RN, MSN, CDE Diabetes Coordinator Inpatient Glycemic Control Team Team Pager: [phone] (8a-5p)

## 2018-06-09 NOTE — Discharge Summary (Signed)
Physician Discharge Summary  Kaitlin Vargas YTK:[id number] DOB: 01-22-1951 DOA: 06/07/2018  PCP: Collene Schlichter., MD  Admit date: 06/07/2018  Discharge date: 06/09/2018  Admitted From:Home  Disposition:  Home  Recommendations for Outpatient Follow-up:  1. Follow up with PCP in 1-2 weeks 2. Continue on B12 supplementation as prescribed 3. Follow-up with hematology in the next 1 to 2 weeks for evaluation of pancytopenia 4. Follow-up with GI as scheduled in 1 to 2 weeks for outpatient endoscopy given anemia 5. Continue on prednisone taper as well as albuterol and antitussives as prescribed  Home Health: None  Equipment/Devices: None  Discharge Condition: Stable  CODE STATUS: Full  Diet recommendation: Heart Healthy/carb modified  Brief/Interim Summary: Per HPI: Kaitlin Vargas a 68 y.o.femalewith medical history significant fortype 2 diabetes, dyslipidemia, hypertension, and anemia who presented to the ED with worsening nonproductive cough as well as body aches, nausea, 2 episodes of emesis and worsening fatigue. She states that her symptoms initially began a little over a week ago at which point her PCP diagnosed her with a bronchitis and gave her a Z-Pak as well as some prednisone which she finished about 5 days ago. Her symptoms hardly improved and returned about 3 days ago. She denies any fevers or chills or diarrhea. No blood in the stool, melena, or hematemesis noted. She did have a sick contact with similar symptoms which would be her daughter. She presently denies any aggravating or alleviating factors.  Patient was admitted for acute hypoxemic respiratory failure in the setting of acute bronchitis and was also noted to have some worsening anemia with no overt bleeding noted.  She is improving with symptomatic treatment and will require blood transfusion today.  She is noted to have vitamin B12 deficiency.  Patient has undergone PRBC transfusion with improvement to hemoglobin  levels with hemoglobin of 10.7 on day of discharge that has remained stable with bowel movements demonstrating no overt bleeding.  She has been given a shot of B12 and B12 oral supplementation to continue on at home with further follow-up to her PCP.  She has also been recommended to see hematology as well as GI for endoscopy as well as evaluation for pancytopenia.  She was noted to be positive for RSV and has not required any antibiotics.  No other acute events noted throughout the course of this admission.  Discharge Diagnoses:  Principal Problem:   Acute hypoxemic respiratory failure (HCC) Active Problems:   Essential hypertension   Type 2 diabetes mellitus with hyperlipidemia (HCC)   Anemia   Acute bronchitis  Principal discharge diagnosis: Acute hypoxemic respiratory failure secondary to acute RSV bronchitis along with anemia secondary to B12 deficiency.  Discharge Instructions  Discharge Instructions    Diet - low sodium heart healthy   Complete by:  As directed    Increase activity slowly   Complete by:  As directed      Allergies as of 06/09/2018      Reactions   Sulfa Antibiotics Rash      Medication List    TAKE these medications   albuterol 108 (90 Base) MCG/ACT inhaler Commonly known as:  PROVENTIL HFA;VENTOLIN HFA Inhale 2 puffs into the lungs every 6 (six) hours as needed for wheezing or shortness of breath.   aspirin 81 MG chewable tablet Chew 81 mg by mouth once.   dextromethorphan-guaiFENesin 30-600 MG 12hr tablet Commonly known as:  MUCINEX DM Take 1 tablet by mouth 2 (two) times daily for 10 days.   glipiZIDE  10 MG 24 hr tablet Commonly known as:  GLUCOTROL XL Take 10 mg by mouth 2 (two) times daily.   lisinopril 20 MG tablet Commonly known as:  PRINIVIL,ZESTRIL Take 10 mg by mouth daily.   metFORMIN 1000 MG tablet Commonly known as:  GLUCOPHAGE Take 1,000 mg by mouth 2 (two) times daily with a meal.   metoprolol tartrate 25 MG tablet Commonly  known as:  LOPRESSOR Take 25 mg by mouth daily.   pantoprazole 40 MG tablet Commonly known as:  PROTONIX Take 1 tablet (40 mg total) by mouth daily.   pioglitazone 15 MG tablet Commonly known as:  ACTOS Take 15 mg by mouth daily.   predniSONE 20 MG tablet Commonly known as:  DELTASONE Take 2 tablets (40 mg total) by mouth daily with breakfast for 5 days. Start taking on:  June 10, 2018   THERA Tabs Take 1 tablet by mouth daily.   vitamin B-12 1000 MCG tablet Commonly known as:  CYANOCOBALAMIN Take 1 tablet (1,000 mcg total) by mouth daily for 30 days.      Follow-up Information    Collene Schlichter., MD. Schedule an appointment as soon as possible for a visit in 1 week(s).   Specialty:  General Practice Contact information: [address] [address] [phone]        Derek Jack, MD. Schedule an appointment as soon as possible for a visit in 2 week(s).   Specialty:  Hematology Contact information: [place] [address] [phone]        Daneil Dolin, MD. Schedule an appointment as soon as possible for a visit in 1 week(s).   Specialty:  Gastroenterology Contact information: [place] [address] [phone]          Allergies  Allergen Reactions  . Sulfa Antibiotics Rash    Consultations:  None   Procedures/Studies: Dg Chest 2 View  Result Date: 06/07/2018 CLINICAL DATA:  Cough and shortness of breath EXAM: CHEST - 2 VIEW COMPARISON:  None. FINDINGS: There is atelectatic change in the right mid lung and left base regions. There is no edema or consolidation. Heart is borderline enlarged with pulmonary vascularity normal. No adenopathy. There is calcification in the left anterior descending coronary artery. There is degenerative change in the thoracic spine. IMPRESSION: Areas of atelectatic change. No edema or consolidation. Heart borderline enlarged with foci coronary artery calcification.  No adenopathy. Electronically Signed   By: Lowella Grip III M.D.   On: 06/07/2018 11:49    Discharge Exam: Vitals:   06/08/18 2100 06/09/18 0700  BP: (!) 148/85 (!) 146/81  Pulse: (!) 103 72  Resp: 18 19  Temp: 98.3 F (36.8 C) 98.4 F (36.9 C)  SpO2: 95% 92%   Vitals:   06/08/18 1847 06/08/18 1959 06/08/18 2100 06/09/18 0700  BP: (!) 142/68  (!) 148/85 (!) 146/81  Pulse: 98  (!) 103 72  Resp: 18  18 19   Temp: 98.9 F (37.2 C)  98.3 F (36.8 C) 98.4 F (36.9 C)  TempSrc: Oral  Oral Oral  SpO2:  96% 95% 92%  Weight:      Height:        General: Pt is alert, awake, not in acute distress Cardiovascular: RRR, S1/S2 +, no rubs, no gallops Respiratory: CTA bilaterally, no wheezing, no rhonchi Abdominal: Soft, NT, ND, bowel sounds + Extremities: no edema, no cyanosis    The results of significant diagnostics from this hospitalization (  including imaging, microbiology, ancillary and laboratory) are listed below for reference.     Microbiology: Recent Results (from the past 240 hour(s))  Respiratory Panel by PCR     Status: Abnormal   Collection Time: 06/07/18  2:36 PM  Result Value Ref Range Status   Adenovirus NOT DETECTED NOT DETECTED Final   Coronavirus 229E NOT DETECTED NOT DETECTED Final   Coronavirus HKU1 NOT DETECTED NOT DETECTED Final   Coronavirus NL63 NOT DETECTED NOT DETECTED Final   Coronavirus OC43 NOT DETECTED NOT DETECTED Final   Metapneumovirus NOT DETECTED NOT DETECTED Final   Rhinovirus / Enterovirus NOT DETECTED NOT DETECTED Final   Influenza A NOT DETECTED NOT DETECTED Final   Influenza B NOT DETECTED NOT DETECTED Final   Parainfluenza Virus 1 NOT DETECTED NOT DETECTED Final   Parainfluenza Virus 2 NOT DETECTED NOT DETECTED Final   Parainfluenza Virus 3 NOT DETECTED NOT DETECTED Final   Parainfluenza Virus 4 NOT DETECTED NOT DETECTED Final   Respiratory Syncytial Virus DETECTED (A) NOT DETECTED Final    Comment: CRITICAL RESULT CALLED TO,  READ BACK BY AND VERIFIED WITH: K. GRAVES,RN 0245 06/08/2018 T. TYSOR    Bordetella pertussis NOT DETECTED NOT DETECTED Final   Chlamydophila pneumoniae NOT DETECTED NOT DETECTED Final   Mycoplasma pneumoniae NOT DETECTED NOT DETECTED Final     Labs: BNP (last 3 results) No results for input(s): BNP in the last 8760 hours. Basic Metabolic Panel: Recent Labs  Lab 06/07/18 1200 06/08/18 0548  NA 131* 135  K 4.2 4.6  CL 96* 102  CO2 22 24  GLUCOSE 216* 263*  BUN 23 20  CREATININE 0.76 0.62  CALCIUM 8.7* 8.6*   Liver Function Tests: No results for input(s): AST, ALT, ALKPHOS, BILITOT, PROT, ALBUMIN in the last 168 hours. No results for input(s): LIPASE, AMYLASE in the last 168 hours. No results for input(s): AMMONIA in the last 168 hours. CBC: Recent Labs  Lab 06/07/18 1200 06/08/18 0548 06/08/18 2201 06/09/18 0607  WBC 3.2* 2.7*  --  4.0  NEUTROABS 2.1  --   --   --   HGB 7.6* 6.5* 9.1* 10.7*  HCT 24.5* 20.9* 28.0* 33.4*  MCV 114.5* 114.8*  --  104.4*  PLT 151 123*  --  149*   Cardiac Enzymes: Recent Labs  Lab 06/07/18 1200  TROPONINI <0.03   BNP: Invalid input(s): POCBNP CBG: Recent Labs  Lab 06/08/18 1115 06/08/18 1618 06/08/18 1850 06/08/18 1935 06/08/18 2129  GLUCAP 305* 37* 53* 104* 118*   D-Dimer No results for input(s): DDIMER in the last 72 hours. Hgb A1c Recent Labs    06/07/18 1209  HGBA1C 8.5*   Lipid Profile No results for input(s): CHOL, HDL, LDLCALC, TRIG, CHOLHDL, LDLDIRECT in the last 72 hours. Thyroid function studies No results for input(s): TSH, T4TOTAL, T3FREE, THYROIDAB in the last 72 hours.  Invalid input(s): FREET3 Anemia work up Recent Labs    06/07/18 1209 06/07/18 1525  VITAMINB12 7*  --   FOLATE  --  20.5  FERRITIN 178  --   TIBC 254  --   IRON 29  --   RETICCTPCT 0.5  --    Urinalysis No results found for: COLORURINE, APPEARANCEUR, LABSPEC, Carey, GLUCOSEU, HGBUR, BILIRUBINUR, KETONESUR, PROTEINUR,  UROBILINOGEN, NITRITE, LEUKOCYTESUR Sepsis Labs Invalid input(s): PROCALCITONIN,  WBC,  LACTICIDVEN Microbiology Recent Results (from the past 240 hour(s))  Respiratory Panel by PCR     Status: Abnormal   Collection Time: 06/07/18  2:36 PM  Result Value Ref Range Status   Adenovirus NOT DETECTED NOT DETECTED Final   Coronavirus 229E NOT DETECTED NOT DETECTED Final   Coronavirus HKU1 NOT DETECTED NOT DETECTED Final   Coronavirus NL63 NOT DETECTED NOT DETECTED Final   Coronavirus OC43 NOT DETECTED NOT DETECTED Final   Metapneumovirus NOT DETECTED NOT DETECTED Final   Rhinovirus / Enterovirus NOT DETECTED NOT DETECTED Final   Influenza A NOT DETECTED NOT DETECTED Final   Influenza B NOT DETECTED NOT DETECTED Final   Parainfluenza Virus 1 NOT DETECTED NOT DETECTED Final   Parainfluenza Virus 2 NOT DETECTED NOT DETECTED Final   Parainfluenza Virus 3 NOT DETECTED NOT DETECTED Final   Parainfluenza Virus 4 NOT DETECTED NOT DETECTED Final   Respiratory Syncytial Virus DETECTED (A) NOT DETECTED Final    Comment: CRITICAL RESULT CALLED TO, READ BACK BY AND VERIFIED WITH: K. GRAVES,RN 2060 06/08/2018 T. TYSOR    Bordetella pertussis NOT DETECTED NOT DETECTED Final   Chlamydophila pneumoniae NOT DETECTED NOT DETECTED Final   Mycoplasma pneumoniae NOT DETECTED NOT DETECTED Final     Time coordinating discharge: 35 minutes  SIGNED:   Rodena Goldmann, DO [facility] 06/09/2018, 9:04 AM Pager [phone]  If 7PM-7AM, please contact night-coverage [email or web address] Password [email or web address]

## 2018-06-09 NOTE — Progress Notes (Signed)
IV removed, WNL. D/C instructions given to pt. Verbalized understanding. Pt family member at bedside to transport home.  

## 2018-06-16 ENCOUNTER — Emergency Department (HOSPITAL_COMMUNITY): Payer: Medicare HMO

## 2018-06-16 ENCOUNTER — Other Ambulatory Visit: Payer: Self-pay

## 2018-06-16 ENCOUNTER — Encounter (HOSPITAL_COMMUNITY): Payer: Self-pay

## 2018-06-16 ENCOUNTER — Inpatient Hospital Stay (HOSPITAL_COMMUNITY): Payer: Medicare HMO

## 2018-06-16 ENCOUNTER — Inpatient Hospital Stay (HOSPITAL_COMMUNITY)
Admission: EM | Admit: 2018-06-16 | Discharge: 2018-06-26 | DRG: 539 | Disposition: A | Discharge status: Rehabilitation Facility | Payer: Medicare HMO | Attending: Internal Medicine | Admitting: Internal Medicine

## 2018-06-16 DIAGNOSIS — E785 Hyperlipidemia, unspecified: Secondary | ICD-10-CM | POA: Diagnosis present

## 2018-06-16 DIAGNOSIS — I469 Cardiac arrest, cause unspecified: Secondary | ICD-10-CM | POA: Diagnosis not present

## 2018-06-16 DIAGNOSIS — L97401 Non-pressure chronic ulcer of unspecified heel and midfoot limited to breakdown of skin: Secondary | ICD-10-CM

## 2018-06-16 DIAGNOSIS — M4626 Osteomyelitis of vertebra, lumbar region: Principal | ICD-10-CM | POA: Diagnosis present

## 2018-06-16 DIAGNOSIS — G061 Intraspinal abscess and granuloma: Secondary | ICD-10-CM | POA: Diagnosis present

## 2018-06-16 DIAGNOSIS — R7309 Other abnormal glucose: Secondary | ICD-10-CM | POA: Diagnosis not present

## 2018-06-16 DIAGNOSIS — L97509 Non-pressure chronic ulcer of other part of unspecified foot with unspecified severity: Secondary | ICD-10-CM

## 2018-06-16 DIAGNOSIS — Z881 Allergy status to other antibiotic agents status: Secondary | ICD-10-CM | POA: Diagnosis not present

## 2018-06-16 DIAGNOSIS — E871 Hypo-osmolality and hyponatremia: Secondary | ICD-10-CM | POA: Diagnosis present

## 2018-06-16 DIAGNOSIS — E1151 Type 2 diabetes mellitus with diabetic peripheral angiopathy without gangrene: Secondary | ICD-10-CM | POA: Diagnosis not present

## 2018-06-16 DIAGNOSIS — Z7984 Long term (current) use of oral hypoglycemic drugs: Secondary | ICD-10-CM

## 2018-06-16 DIAGNOSIS — L602 Onychogryphosis: Secondary | ICD-10-CM | POA: Diagnosis present

## 2018-06-16 DIAGNOSIS — Z79899 Other long term (current) drug therapy: Secondary | ICD-10-CM

## 2018-06-16 DIAGNOSIS — Z823 Family history of stroke: Secondary | ICD-10-CM

## 2018-06-16 DIAGNOSIS — L03115 Cellulitis of right lower limb: Secondary | ICD-10-CM | POA: Diagnosis present

## 2018-06-16 DIAGNOSIS — I110 Hypertensive heart disease with heart failure: Secondary | ICD-10-CM | POA: Diagnosis present

## 2018-06-16 DIAGNOSIS — M545 Low back pain: Secondary | ICD-10-CM | POA: Diagnosis present

## 2018-06-16 DIAGNOSIS — E1142 Type 2 diabetes mellitus with diabetic polyneuropathy: Secondary | ICD-10-CM | POA: Diagnosis present

## 2018-06-16 DIAGNOSIS — M4645 Discitis, unspecified, thoracolumbar region: Secondary | ICD-10-CM

## 2018-06-16 DIAGNOSIS — N179 Acute kidney failure, unspecified: Secondary | ICD-10-CM | POA: Diagnosis present

## 2018-06-16 DIAGNOSIS — R7881 Bacteremia: Secondary | ICD-10-CM | POA: Diagnosis not present

## 2018-06-16 DIAGNOSIS — L97419 Non-pressure chronic ulcer of right heel and midfoot with unspecified severity: Secondary | ICD-10-CM | POA: Diagnosis present

## 2018-06-16 DIAGNOSIS — M4646 Discitis, unspecified, lumbar region: Secondary | ICD-10-CM

## 2018-06-16 DIAGNOSIS — Z8674 Personal history of sudden cardiac arrest: Secondary | ICD-10-CM | POA: Diagnosis not present

## 2018-06-16 DIAGNOSIS — E538 Deficiency of other specified B group vitamins: Secondary | ICD-10-CM | POA: Diagnosis present

## 2018-06-16 DIAGNOSIS — B9561 Methicillin susceptible Staphylococcus aureus infection as the cause of diseases classified elsewhere: Secondary | ICD-10-CM | POA: Diagnosis present

## 2018-06-16 DIAGNOSIS — E11621 Type 2 diabetes mellitus with foot ulcer: Secondary | ICD-10-CM

## 2018-06-16 DIAGNOSIS — Z882 Allergy status to sulfonamides status: Secondary | ICD-10-CM

## 2018-06-16 DIAGNOSIS — Z95828 Presence of other vascular implants and grafts: Secondary | ICD-10-CM

## 2018-06-16 DIAGNOSIS — B9562 Methicillin resistant Staphylococcus aureus infection as the cause of diseases classified elsewhere: Secondary | ICD-10-CM | POA: Diagnosis not present

## 2018-06-16 DIAGNOSIS — J9600 Acute respiratory failure, unspecified whether with hypoxia or hypercapnia: Secondary | ICD-10-CM | POA: Diagnosis not present

## 2018-06-16 DIAGNOSIS — M48061 Spinal stenosis, lumbar region without neurogenic claudication: Secondary | ICD-10-CM | POA: Diagnosis not present

## 2018-06-16 DIAGNOSIS — R05 Cough: Secondary | ICD-10-CM | POA: Diagnosis not present

## 2018-06-16 DIAGNOSIS — R32 Unspecified urinary incontinence: Secondary | ICD-10-CM | POA: Diagnosis present

## 2018-06-16 DIAGNOSIS — E1169 Type 2 diabetes mellitus with other specified complication: Secondary | ICD-10-CM | POA: Diagnosis not present

## 2018-06-16 DIAGNOSIS — D473 Essential (hemorrhagic) thrombocythemia: Secondary | ICD-10-CM | POA: Diagnosis present

## 2018-06-16 DIAGNOSIS — E1110 Type 2 diabetes mellitus with ketoacidosis without coma: Secondary | ICD-10-CM | POA: Diagnosis present

## 2018-06-16 DIAGNOSIS — R5381 Other malaise: Secondary | ICD-10-CM | POA: Diagnosis not present

## 2018-06-16 DIAGNOSIS — M4620 Osteomyelitis of vertebra, site unspecified: Secondary | ICD-10-CM | POA: Diagnosis not present

## 2018-06-16 DIAGNOSIS — D649 Anemia, unspecified: Secondary | ICD-10-CM | POA: Diagnosis not present

## 2018-06-16 DIAGNOSIS — M4640 Discitis, unspecified, site unspecified: Secondary | ICD-10-CM

## 2018-06-16 DIAGNOSIS — G9340 Encephalopathy, unspecified: Secondary | ICD-10-CM | POA: Diagnosis not present

## 2018-06-16 DIAGNOSIS — Z9119 Patient's noncompliance with other medical treatment and regimen: Secondary | ICD-10-CM

## 2018-06-16 DIAGNOSIS — E1140 Type 2 diabetes mellitus with diabetic neuropathy, unspecified: Secondary | ICD-10-CM | POA: Diagnosis not present

## 2018-06-16 DIAGNOSIS — Z7982 Long term (current) use of aspirin: Secondary | ICD-10-CM

## 2018-06-16 DIAGNOSIS — L97519 Non-pressure chronic ulcer of other part of right foot with unspecified severity: Secondary | ICD-10-CM | POA: Diagnosis not present

## 2018-06-16 DIAGNOSIS — K5900 Constipation, unspecified: Secondary | ICD-10-CM | POA: Diagnosis present

## 2018-06-16 DIAGNOSIS — E08621 Diabetes mellitus due to underlying condition with foot ulcer: Secondary | ICD-10-CM | POA: Diagnosis not present

## 2018-06-16 DIAGNOSIS — E876 Hypokalemia: Secondary | ICD-10-CM | POA: Diagnosis not present

## 2018-06-16 DIAGNOSIS — M25551 Pain in right hip: Secondary | ICD-10-CM | POA: Diagnosis present

## 2018-06-16 DIAGNOSIS — E118 Type 2 diabetes mellitus with unspecified complications: Secondary | ICD-10-CM | POA: Diagnosis not present

## 2018-06-16 DIAGNOSIS — Z806 Family history of leukemia: Secondary | ICD-10-CM

## 2018-06-16 DIAGNOSIS — R0602 Shortness of breath: Secondary | ICD-10-CM

## 2018-06-16 DIAGNOSIS — R627 Adult failure to thrive: Secondary | ICD-10-CM | POA: Diagnosis present

## 2018-06-16 DIAGNOSIS — I10 Essential (primary) hypertension: Secondary | ICD-10-CM | POA: Diagnosis present

## 2018-06-16 DIAGNOSIS — I5031 Acute diastolic (congestive) heart failure: Secondary | ICD-10-CM | POA: Diagnosis not present

## 2018-06-16 DIAGNOSIS — Z90710 Acquired absence of both cervix and uterus: Secondary | ICD-10-CM

## 2018-06-16 DIAGNOSIS — Z6834 Body mass index (BMI) 34.0-34.9, adult: Secondary | ICD-10-CM

## 2018-06-16 DIAGNOSIS — M62838 Other muscle spasm: Secondary | ICD-10-CM | POA: Diagnosis not present

## 2018-06-16 DIAGNOSIS — R000 Tachycardia, unspecified: Secondary | ICD-10-CM | POA: Diagnosis not present

## 2018-06-16 LAB — BASIC METABOLIC PANEL
ANION GAP: 20 — AB (ref 5–15)
Anion gap: 14 (ref 5–15)
BUN: 26 mg/dL — ABNORMAL HIGH (ref 8–23)
BUN: 25 mg/dL — AB (ref 8–23)
CO2: 15 mmol/L — ABNORMAL LOW (ref 22–32)
CO2: 19 mmol/L — ABNORMAL LOW (ref 22–32)
Calcium: 8.5 mg/dL — ABNORMAL LOW (ref 8.9–10.3)
Calcium: 8.3 mg/dL — ABNORMAL LOW (ref 8.9–10.3)
Chloride: 98 mmol/L (ref 98–111)
Chloride: 99 mmol/L (ref 98–111)
Creatinine, Ser: 0.93 mg/dL (ref 0.44–1.00)
Creatinine, Ser: 0.81 mg/dL (ref 0.44–1.00)
GFR calc Af Amer: >60 mL/min (ref >60)
GFR calc Af Amer: >60 mL/min (ref >60)
GFR calc non Af Amer: >60 mL/min (ref >60)
GFR calc non Af Amer: >60 mL/min (ref >60)
Glucose, Bld: 434 mg/dL — ABNORMAL HIGH (ref 70–99)
Glucose, Bld: 287 mg/dL — ABNORMAL HIGH (ref 70–99)
POTASSIUM: 4 mmol/L (ref 3.5–5.1)
Potassium: 3.5 mmol/L (ref 3.5–5.1)
SODIUM: 132 mmol/L — AB (ref 135–145)
Sodium: 133 mmol/L — ABNORMAL LOW (ref 135–145)

## 2018-06-16 LAB — URINALYSIS, ROUTINE W REFLEX MICROSCOPIC
Bilirubin Urine: NEGATIVE
Glucose, UA: >=500 mg/dL — AB
Ketones, ur: 80 mg/dL — AB
Leukocytes, UA: NEGATIVE
Nitrite: POSITIVE — AB
Protein, ur: NEGATIVE mg/dL
Specific Gravity, Urine: 1.022 (ref 1.005–1.030)
pH: 6 (ref 5.0–8.0)

## 2018-06-16 LAB — COMPREHENSIVE METABOLIC PANEL
ALK PHOS: 103 U/L (ref 38–126)
ALT: 10 U/L (ref 0–44)
AST: 12 U/L — AB (ref 15–41)
Albumin: 2.5 g/dL — ABNORMAL LOW (ref 3.5–5.0)
Anion gap: >20 — ABNORMAL HIGH (ref 5–15)
BUN: 27 mg/dL — AB (ref 8–23)
CO2: 14 mmol/L — ABNORMAL LOW (ref 22–32)
CREATININE: 1.02 mg/dL — AB (ref 0.44–1.00)
Calcium: 9 mg/dL (ref 8.9–10.3)
Chloride: 93 mmol/L — ABNORMAL LOW (ref 98–111)
GFR calc Af Amer: >60 mL/min (ref >60)
GFR calc non Af Amer: 56 mL/min — ABNORMAL LOW (ref >60)
Glucose, Bld: 414 mg/dL — ABNORMAL HIGH (ref 70–99)
Potassium: 4.7 mmol/L (ref 3.5–5.1)
Sodium: 132 mmol/L — ABNORMAL LOW (ref 135–145)
Total Bilirubin: 2.8 mg/dL — ABNORMAL HIGH (ref 0.3–1.2)
Total Protein: 7.1 g/dL (ref 6.5–8.1)

## 2018-06-16 LAB — CBC WITH DIFFERENTIAL/PLATELET
Abs Immature Granulocytes: 0.43 10*3/uL — ABNORMAL HIGH (ref 0.00–0.07)
BASOS PCT: 0 %
Basophils Absolute: 0.1 10*3/uL (ref 0.0–0.1)
Eosinophils Absolute: 0 10*3/uL (ref 0.0–0.5)
Eosinophils Relative: 0 %
HCT: 38.3 % (ref 36.0–46.0)
Hemoglobin: 11.6 g/dL — ABNORMAL LOW (ref 12.0–15.0)
Immature Granulocytes: 2 %
Lymphocytes Relative: 3 %
Lymphs Abs: 0.6 10*3/uL — ABNORMAL LOW (ref 0.7–4.0)
MCH: 32.5 pg (ref 26.0–34.0)
MCHC: 30.3 g/dL (ref 30.0–36.0)
MCV: 107.3 fL — ABNORMAL HIGH (ref 80.0–100.0)
Monocytes Absolute: 1.1 10*3/uL — ABNORMAL HIGH (ref 0.1–1.0)
Monocytes Relative: 5 %
NRBC: 0 % (ref 0.0–0.2)
Neutro Abs: 20.4 10*3/uL — ABNORMAL HIGH (ref 1.7–7.7)
Neutrophils Relative %: 90 %
Platelets: 424 10*3/uL — ABNORMAL HIGH (ref 150–400)
RBC: 3.57 MIL/uL — AB (ref 3.87–5.11)
RDW: 20.4 % — ABNORMAL HIGH (ref 11.5–15.5)
WBC: 22.6 10*3/uL — ABNORMAL HIGH (ref 4.0–10.5)

## 2018-06-16 LAB — GLUCOSE, CAPILLARY
Glucose-Capillary: 321 mg/dL — ABNORMAL HIGH (ref 70–99)
Glucose-Capillary: 265 mg/dL — ABNORMAL HIGH (ref 70–99)
Glucose-Capillary: 202 mg/dL — ABNORMAL HIGH (ref 70–99)

## 2018-06-16 LAB — RAPID URINE DRUG SCREEN, HOSP PERFORMED
Amphetamines: NOT DETECTED
Barbiturates: NOT DETECTED
Benzodiazepines: NOT DETECTED
Cocaine: NOT DETECTED
Opiates: NOT DETECTED
Tetrahydrocannabinol: NOT DETECTED

## 2018-06-16 LAB — LACTIC ACID, PLASMA
Lactic Acid, Venous: 1.5 mmol/L (ref 0.5–1.9)
Lactic Acid, Venous: 1.1 mmol/L (ref 0.5–1.9)

## 2018-06-16 LAB — CBG MONITORING, ED
Glucose-Capillary: 377 mg/dL — ABNORMAL HIGH (ref 70–99)
Glucose-Capillary: 352 mg/dL — ABNORMAL HIGH (ref 70–99)
Glucose-Capillary: 371 mg/dL — ABNORMAL HIGH (ref 70–99)

## 2018-06-16 LAB — LIPASE, BLOOD: LIPASE: 17 U/L (ref 11–51)

## 2018-06-16 LAB — SEDIMENTATION RATE: SED RATE: 86 mm/h — AB (ref 0–22)

## 2018-06-16 MED ORDER — DEXTROSE-NACL 5-0.45 % IV SOLN
INTRAVENOUS | Status: DC
  Administered 2018-06-16 – 2018-06-17 (×2): via INTRAVENOUS

## 2018-06-16 MED ORDER — ONDANSETRON HCL 4 MG/2ML IJ SOLN
4.0000 mg | Freq: Four times a day (QID) | INTRAMUSCULAR | Status: DC | PRN
  Administered 2018-06-16 – 2018-06-18 (×2): 4 mg via INTRAVENOUS
  Filled 2018-06-16 (×2): qty 2

## 2018-06-16 MED ORDER — SODIUM CHLORIDE 0.9 % IV BOLUS
500.0000 mL | Freq: Once | INTRAVENOUS | Status: AC
  Administered 2018-06-16: 500 mL via INTRAVENOUS

## 2018-06-16 MED ORDER — INSULIN REGULAR(HUMAN) IN NACL 100-0.9 UT/100ML-% IV SOLN
INTRAVENOUS | Status: DC
  Administered 2018-06-16: 2.9 [IU]/h via INTRAVENOUS
  Filled 2018-06-16: qty 100

## 2018-06-16 MED ORDER — VITAMIN B-12 1000 MCG PO TABS
1000.0000 ug | ORAL_TABLET | Freq: Every day | ORAL | Status: DC
  Administered 2018-06-17 – 2018-06-26 (×9): 1000 ug via ORAL
  Filled 2018-06-16 (×9): qty 1

## 2018-06-16 MED ORDER — GADOBUTROL 1 MMOL/ML IV SOLN
7.0000 mL | Freq: Once | INTRAVENOUS | Status: AC | PRN
  Administered 2018-06-16: 7 mL via INTRAVENOUS

## 2018-06-16 MED ORDER — ALBUTEROL SULFATE (2.5 MG/3ML) 0.083% IN NEBU
2.5000 mg | INHALATION_SOLUTION | RESPIRATORY_TRACT | Status: DC | PRN
  Filled 2018-06-16: qty 3

## 2018-06-16 MED ORDER — ASPIRIN 81 MG PO CHEW
81.0000 mg | CHEWABLE_TABLET | Freq: Every day | ORAL | Status: DC
  Administered 2018-06-17 – 2018-06-26 (×9): 81 mg via ORAL
  Filled 2018-06-16 (×9): qty 1

## 2018-06-16 MED ORDER — ENOXAPARIN SODIUM 40 MG/0.4ML ~~LOC~~ SOLN
40.0000 mg | SUBCUTANEOUS | Status: AC
  Administered 2018-06-16: 40 mg via SUBCUTANEOUS
  Filled 2018-06-16: qty 0.4

## 2018-06-16 MED ORDER — METOPROLOL TARTRATE 25 MG PO TABS
12.5000 mg | ORAL_TABLET | Freq: Two times a day (BID) | ORAL | Status: DC
  Administered 2018-06-16 – 2018-06-17 (×3): 12.5 mg via ORAL
  Filled 2018-06-16 (×3): qty 1

## 2018-06-16 MED ORDER — PANTOPRAZOLE SODIUM 40 MG PO TBEC
40.0000 mg | DELAYED_RELEASE_TABLET | Freq: Every day | ORAL | Status: DC
  Administered 2018-06-17 – 2018-06-26 (×10): 40 mg via ORAL
  Filled 2018-06-16 (×10): qty 1

## 2018-06-16 MED ORDER — SODIUM CHLORIDE 0.9 % IV SOLN: INTRAVENOUS | Status: AC

## 2018-06-16 MED ORDER — ACETAMINOPHEN 325 MG PO TABS
650.0000 mg | ORAL_TABLET | Freq: Four times a day (QID) | ORAL | Status: DC | PRN
  Administered 2018-06-19 – 2018-06-20 (×3): 650 mg via ORAL
  Filled 2018-06-16 (×3): qty 2

## 2018-06-16 MED ORDER — METOPROLOL TARTRATE 25 MG PO TABS
25.0000 mg | ORAL_TABLET | Freq: Once | ORAL | Status: AC
  Administered 2018-06-16: 25 mg via ORAL
  Filled 2018-06-16: qty 1

## 2018-06-16 MED ORDER — SODIUM CHLORIDE 0.9 % IV SOLN
1.0000 g | Freq: Once | INTRAVENOUS | Status: AC
  Administered 2018-06-16: 1 g via INTRAVENOUS
  Filled 2018-06-16: qty 10

## 2018-06-16 MED ORDER — SODIUM CHLORIDE 0.9 % IV SOLN
INTRAVENOUS | Status: DC
  Administered 2018-06-16 (×2): via INTRAVENOUS

## 2018-06-16 MED ORDER — POTASSIUM CHLORIDE 10 MEQ/100ML IV SOLN
10.0000 meq | INTRAVENOUS | Status: AC
  Administered 2018-06-16 (×2): 10 meq via INTRAVENOUS
  Filled 2018-06-16 (×2): qty 100

## 2018-06-16 MED ORDER — LISINOPRIL 10 MG PO TABS
10.0000 mg | ORAL_TABLET | Freq: Every day | ORAL | Status: DC
  Administered 2018-06-17 – 2018-06-26 (×10): 10 mg via ORAL
  Filled 2018-06-16 (×10): qty 1

## 2018-06-16 MED ORDER — MORPHINE SULFATE (PF) 2 MG/ML IV SOLN
2.0000 mg | INTRAVENOUS | Status: DC | PRN
  Administered 2018-06-16 – 2018-06-18 (×4): 2 mg via INTRAVENOUS
  Filled 2018-06-16 (×4): qty 1

## 2018-06-16 NOTE — ED Provider Notes (Addendum)
Emergency Department Provider Note   I have reviewed the triage vital signs and the nursing notes.   HISTORY  Chief Complaint Back Pain   HPI Kaitlin Vargas is a 68 y.o. female with PMH of DM, HLD, and HTN returns to the emergency department with generalized weakness and lower back pain.  Patient states that her lower back discomfort has been ongoing for the past 2 to 3 weeks.  She was admitted to the hospital recently and discharged on 1/20 with symptomatic anemia, tachycardia, and hypoxemia.  She is returned home and completed her medications.  She received a blood transfusion while admitted.  No blood in the bowel movements.  She has not noticed any worsening respiratory symptoms such as cough, shortness of breath, chest pain.  She has not seen any black or bright red blood per rectum. No fever/chills. She denies any numbness, tingling, weakness in the lower extremities.  No groin numbness.  No bowel or bladder incontinence or retention symptoms.   Past Medical History:  Diagnosis Date  . Anemia   . Diabetes mellitus without complication ([place])   . Hyperlipidemia   . Hypertension   . Peripheral neuropathy     Patient Active Problem List   Diagnosis Date Noted  . 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 DKA (diabetic ketoacidosis) ([place]) 06/16/2018  . Diabetic foot ulcer ([place]) 06/16/2018  . Acute hypoxemic respiratory failure ([place]) 06/07/2018  . Essential hypertension 06/07/2018  . Type 2 diabetes mellitus with hyperlipidemia ([place]) 06/07/2018  . Anemia 06/07/2018  . Acute bronchitis 06/07/2018    Past Surgical History:  Procedure Laterality Date  . ABDOMINAL HYSTERECTOMY     Allergies Sulfa antibiotics  Family History  Problem Relation Age of Onset  . Cancer Mother   . Stroke Mother   . Cancer Father   . Leukemia Father     Social History Social History   Tobacco Use  . Smoking status: Never Smoker  . Smokeless tobacco: Never Used  Substance Use Topics  . Alcohol use: Never    Frequency: Never  . Drug use: Never    Review of Systems  Constitutional: No fever/chills. Positive generalized weakness.  Eyes: No visual changes. ENT: No sore throat. Cardiovascular: Denies chest pain. Respiratory: Denies shortness of breath. Gastrointestinal: No abdominal pain.  No nausea, no vomiting.  No diarrhea.  No constipation. Genitourinary: Negative for dysuria. Musculoskeletal: Positive for back pain. Skin: Negative for rash. Neurological: Negative for headaches, focal weakness or numbness.  10-point ROS otherwise negative.  ____________________________________________   PHYSICAL EXAM:  VITAL SIGNS: ED Triage Vitals [06/16/18 1038]  Enc Vitals Group     BP 128/73     Pulse Rate (!) 126     Resp 20     Temp (!) 97.5 F (36.4 C)     Temp Source Oral     SpO2 99 %     Weight 163 lb (73.9 kg)     Height 4\' 11"  (1.499 m)     Pain Score 10   Constitutional: Alert and oriented. Well appearing and in no acute distress. Eyes: Conjunctivae are normal. Head: Atraumatic. Nose: No congestion/rhinnorhea. Mouth/Throat: Mucous membranes are moist.  Neck: No stridor.   Cardiovascular: Tachycardia. Good peripheral circulation. Grossly normal heart sounds.   Respiratory: Normal respiratory effort.  No retractions. Lungs CTAB. Gastrointestinal: Soft and nontender. No distention.  Musculoskeletal: No lower extremity tenderness nor edema. No gross deformities of extremities. No midline thoracic or lumbar spine tenderness.  Neurologic:  Normal speech  and language. No gross focal neurologic deficits are appreciated. Normal strength and sensation of the bilateral LEs.  Skin:  Skin is warm, dry and intact. No rash noted.  ____________________________________________   LABS (all labs ordered are listed, but only abnormal results are displayed)  Labs Reviewed  CBC WITH DIFFERENTIAL/PLATELET - Abnormal; Notable for the following components:      Result Value   WBC 22.6 (*)     RBC 3.57 (*)    Hemoglobin 11.6 (*)    MCV 107.3 (*)    RDW 20.4 (*)    Platelets 424 (*)    Neutro Abs 20.4 (*)    Lymphs Abs 0.6 (*)    Monocytes Absolute 1.1 (*)    Abs Immature Granulocytes 0.43 (*)    All other components within normal limits  URINALYSIS, ROUTINE W REFLEX MICROSCOPIC - Abnormal; Notable for the following components:   APPearance HAZY (*)    Glucose, UA >=500 (*)    Hgb urine dipstick SMALL (*)    Ketones, ur 80 (*)    Nitrite POSITIVE (*)    Bacteria, UA RARE (*)    All other components within normal limits  COMPREHENSIVE METABOLIC PANEL - Abnormal; Notable for the following components:   Sodium 132 (*)    Chloride 93 (*)    CO2 14 (*)    Glucose, Bld 414 (*)    BUN 27 (*)    Creatinine, Ser 1.02 (*)    Albumin 2.5 (*)    AST 12 (*)    Total Bilirubin 2.8 (*)    GFR calc non Af Amer 56 (*)    Anion gap >20 (*)    All other components within normal limits  BASIC METABOLIC PANEL - Abnormal; Notable for the following components:   Sodium 133 (*)    CO2 15 (*)    Glucose, Bld 434 (*)    BUN 26 (*)    Calcium 8.5 (*)    Anion gap 20 (*)    All other components within normal limits  BASIC METABOLIC PANEL - Abnormal; Notable for the following components:   Sodium 132 (*)    CO2 19 (*)    Glucose, Bld 287 (*)    BUN 25 (*)    Calcium 8.3 (*)    All other components within normal limits  BASIC METABOLIC PANEL - Abnormal; Notable for the following components:   Sodium 131 (*)    Potassium 5.4 (*)    CO2 20 (*)    Glucose, Bld 131 (*)    BUN 24 (*)    Calcium 7.9 (*)    All other components within normal limits  BASIC METABOLIC PANEL - Abnormal; Notable for the following components:   Glucose, Bld 177 (*)    Calcium 8.4 (*)    All other components within normal limits  SEDIMENTATION RATE - Abnormal; Notable for the following components:   Sed Rate 86 (*)    All other components within normal limits  GLUCOSE, CAPILLARY - Abnormal; Notable for  the following components:   Glucose-Capillary 321 (*)    All other components within normal limits  GLUCOSE, CAPILLARY - Abnormal; Notable for the following components:   Glucose-Capillary 265 (*)    All other components within normal limits  GLUCOSE, CAPILLARY - Abnormal; Notable for the following components:   Glucose-Capillary 202 (*)    All other components within normal limits  GLUCOSE, CAPILLARY - Abnormal; Notable for the following components:  Glucose-Capillary 175 (*)    All other components within normal limits  GLUCOSE, CAPILLARY - Abnormal; Notable for the following components:   Glucose-Capillary 158 (*)    All other components within normal limits  GLUCOSE, CAPILLARY - Abnormal; Notable for the following components:   Glucose-Capillary 130 (*)    All other components within normal limits  GLUCOSE, CAPILLARY - Abnormal; Notable for the following components:   Glucose-Capillary 108 (*)    All other components within normal limits  GLUCOSE, CAPILLARY - Abnormal; Notable for the following components:   Glucose-Capillary 108 (*)    All other components within normal limits  GLUCOSE, CAPILLARY - Abnormal; Notable for the following components:   Glucose-Capillary 162 (*)    All other components within normal limits  GLUCOSE, CAPILLARY - Abnormal; Notable for the following components:   Glucose-Capillary 152 (*)    All other components within normal limits  CBG MONITORING, ED - Abnormal; Notable for the following components:   Glucose-Capillary 377 (*)    All other components within normal limits  CBG MONITORING, ED - Abnormal; Notable for the following components:   Glucose-Capillary 352 (*)    All other components within normal limits  CBG MONITORING, ED - Abnormal; Notable for the following components:   Glucose-Capillary 371 (*)    All other components within normal limits  CULTURE, BLOOD (ROUTINE X 2)  CULTURE, BLOOD (ROUTINE X 2)  URINE CULTURE  BLOOD CULTURE ID  PANEL (REFLEXED)  LIPASE, BLOOD  LACTIC ACID, PLASMA  LACTIC ACID, PLASMA  RAPID URINE DRUG SCREEN, HOSP PERFORMED  HIV ANTIBODY (ROUTINE TESTING W REFLEX)  C-REACTIVE PROTEIN  PREALBUMIN  QUANTIFERON-TB GOLD PLUS  BASIC METABOLIC PANEL  BASIC METABOLIC PANEL  GLUCOSE, CAPILLARY  PROTIME-INR   ____________________________________________  EKG   EKG Interpretation  Date/Time:  Monday June 16 2018 13:38:07 EST Ventricular Rate:  122 PR Interval:    QRS Duration: 72 QT Interval:  312 QTC Calculation: 445 R Axis:   -19 Text Interpretation:  Sinus tachycardia Inferior infarct, old No STEMI.  Confirmed by Nanda Quinton [phone]) on 06/16/2018 1:48:58 PM       ____________________________________________  RADIOLOGY  Mr Lumbar Spine W Wo Contrast  Result Date: 06/16/2018 CLINICAL DATA:  Low back pain. Findings concerning for L1-2 discitis-osteomyelitis on abdominal CT. EXAM: MRI LUMBAR SPINE WITHOUT AND WITH CONTRAST TECHNIQUE: Multiplanar and multiecho pulse sequences of the lumbar spine were obtained without and with intravenous contrast. CONTRAST:  7 mL Gadavist COMPARISON:  CT abdomen and pelvis 06/16/2018 FINDINGS: Segmentation:  Standard. Alignment:  Normal. Vertebrae: There are erosive changes involving the L2 superior greater than L1 inferior endplates with extensive marrow edema and enhancement in the L2 greater than L1 vertebral bodies. There is asymmetric moderate disc space loss at L1-2. There is at most minimal ventral epidural phlegmon at L1-2 without evidence of epidural abscess. Bone marrow signal elsewhere is mildly heterogeneous without a suspicious focal lesion identified. No fracture is evident. Conus medullaris and cauda equina: Conus extends to the upper L2 level. Conus and cauda equina appear normal. Paraspinal and other soft tissues: Extensive right-sided and milder left-sided paravertebral soft tissue inflammation centered at L1-2. Partially visualized rim  enhancing fluid collections on the right extending from L1-2 superiorly into the region of the crus of the right hemidiaphragm with the larger collection measuring approximately 5 cm in length. Disc levels: T12-L1: Negative. L1-2: Discitis-osteomyelitis as described above. Mild left neural foraminal narrowing. No spinal stenosis. L2-3: Mild left facet hypertrophy  without disc herniation or stenosis. L3-4: Minimal disc bulging and mild facet hypertrophy without stenosis. L4-5: Disc bulging greater to the right and moderate facet hypertrophy result in mild right neural foraminal stenosis without spinal stenosis. Left larger than right facet joint effusions. L5-S1: Mild facet arthrosis without stenosis. 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 Electronically Signed   By: Logan Bores M.D.   On: 06/16/2018 17:00   Dg Foot Complete Right  Result Date: 06/16/2018 CLINICAL DATA:  68 year old female with foot ulcer EXAM: RIGHT FOOT COMPLETE - 3+ VIEW COMPARISON:  None. FINDINGS: Circumferential soft tissue swelling of the forefoot. No radiopaque foreign body. No lytic changes visualized. Degenerative changes of the interphalangeal joints. Neuropathic midfoot changes including sclerotic changes, disruption of the Lisfranc joint/disorganization, loss of normal joint spaces of the cuneiform is a and cuboid as well as the cuboid navicular joint. Degenerative changes of the hindfoot. IMPRESSION: Circumferential soft tissue swelling of the forefoot, should be amenable to direct inspection. Neuropathic changes of the midfoot, including disruption of the Lisfranc joint. Electronically Signed   By: Corrie Mckusick D.O.   On: 06/16/2018 19:44   Ct Renal Stone Study  Result Date: 06/16/2018 CLINICAL DATA:  Low back pain radiating into the stomach.  Nausea. EXAM: CT ABDOMEN AND PELVIS WITHOUT CONTRAST TECHNIQUE: Multidetector CT imaging of the  abdomen and pelvis was performed following the standard protocol without IV contrast. COMPARISON:  None. FINDINGS: Lower chest: Heart size is mildly enlarged. Calcific coronary artery calcifications are identified. Mild dependent atelectasis noted. No pleural or pericardial effusion. Hepatobiliary: Multiple small stones are seen layering dependently within the gallbladder. There is no inflammatory change about the gallbladder. The liver and biliary tree appear normal. Pancreas: Unremarkable. No pancreatic ductal dilatation or surrounding inflammatory changes. The pancreas is atrophic. Spleen: Normal in size without focal abnormality. Adrenals/Urinary Tract: Small, benign left adrenal adenoma is noted. Right adrenal gland appears normal. The kidneys, ureters and urinary bladder are unremarkable. No hydronephrosis or stones. Stomach/Bowel: Stomach is within normal limits. Appendix appears normal. No evidence of bowel wall thickening, distention, or inflammatory changes. Vascular/Lymphatic: Aortic atherosclerosis. No enlarged abdominal or pelvic lymph nodes. Reproductive: Status post hysterectomy. No adnexal masses. Other: Trace amount of presacral fluid is noted. Musculoskeletal: There is endplate destructive change at L1-2, much worse in the superior endplate of L2. Surrounding paraspinous inflammatory change is worse on the right. Imaged bones otherwise appear normal. IMPRESSION: Findings highly suspicious for discitis and osteomyelitis at L1-2 with associated paraspinous inflammatory change. MRI of the lumbar spine with and without contrast is recommended for further evaluation. Cardiomegaly. Gallstones without evidence of cholecystitis. Calcific aortic and coronary atherosclerosis. These results were called by telephone at the time of interpretation on 06/16/2018 at 3:39 pm to Dr. Nanda Quinton , who verbally acknowledged these results. Electronically Signed   By: Inge Rise M.D.   On: 06/16/2018 15:40     ____________________________________________   PROCEDURES  Procedure(s) performed:   Procedures  CRITICAL CARE Performed by: Margette Fast Total critical care time: 35 minutes Critical care time was exclusive of separately billable procedures and treating other patients. Critical care was necessary to treat or prevent imminent or life-threatening deterioration. Critical care was time spent personally by me on the following activities: development of treatment plan with patient and/or surrogate as well as nursing, discussions with consultants, evaluation of patient's response to treatment, examination of patient, obtaining history from patient or surrogate, ordering and performing  treatments and interventions, ordering and review of laboratory studies, ordering and review of radiographic studies, pulse oximetry and re-evaluation of patient's condition.  Nanda Quinton, MD Emergency Medicine  ____________________________________________   INITIAL IMPRESSION / ASSESSMENT AND PLAN / ED COURSE  Pertinent labs & imaging results that were available during my care of the patient were reviewed by me and considered in my medical decision making (see chart for details).  Patient returns to the emergency department with lower back pain and generalized weakness.  No UTI symptoms.  No findings on exam or historical features to suspect acute spine emergency or require advanced neuro imaging.  Plan for CT renal, labs, UA, reassess.   CT with concern for osteomyelitis/dyscitis. Leukocytosis of 22. Lactate normal. HR improved with metoprolol and IVF. Rocephin given earlier with concern for UTI and developing urosepsis with leukocytosis and tachycardia.   04:00 PM Spoke with Dr. Arnoldo Morale with [facility]. No acute NSG mgmt. Plan for abx and med admit. NSG can get involved if neuro deficits develop, the MRI shows drainable epidural abscess, or if the patient becomes otherwise unstable.   Discussed patient's  case with Hospitalist, Dr. Lorin Mercy to request admission. Patient and family (if present) updated with plan. Care transferred to Hospitalist service.  I reviewed all nursing notes, vitals, pertinent old records, EKGs, labs, imaging (as available).  ____________________________________________  FINAL CLINICAL IMPRESSION(S) / ED DIAGNOSES  Final diagnoses:  Discitis of lumbar region     MEDICATIONS GIVEN DURING THIS VISIT:  Medications  aspirin chewable tablet 81 mg (has no administration in time range)  lisinopril (PRINIVIL,ZESTRIL) tablet 10 mg (has no administration in time range)  metoprolol tartrate (LOPRESSOR) tablet 12.5 mg (12.5 mg Oral Given 06/16/18 2156)  pantoprazole (PROTONIX) EC tablet 40 mg (has no administration in time range)  vitamin B-12 (CYANOCOBALAMIN) tablet 1,000 mcg (has no administration in time range)  0.9 %  sodium chloride infusion ( Intravenous Not Given 06/16/18 2051)  0.9 %  sodium chloride infusion ( Intravenous Restarted 06/16/18 2208)  dextrose 5 %-0.45 % sodium chloride infusion ( Intravenous New Bag/Given 06/17/18 0713)  insulin regular, human (MYXREDLIN) 100 units/ 100 mL infusion ( Intravenous Rate/Dose Verify 06/17/18 0611)  enoxaparin (LOVENOX) injection 40 mg (40 mg Subcutaneous Given 06/16/18 2156)  acetaminophen (TYLENOL) tablet 650 mg (has no administration in time range)  ondansetron (ZOFRAN) injection 4 mg (4 mg Intravenous Given 06/16/18 1859)  albuterol (PROVENTIL) (2.5 MG/3ML) 0.083% nebulizer solution 2.5 mg (has no administration in time range)  morphine 2 MG/ML injection 2 mg (2 mg Intravenous Given 06/16/18 2156)  metoprolol tartrate (LOPRESSOR) tablet 25 mg (25 mg Oral Given 06/16/18 1313)  cefTRIAXone (ROCEPHIN) 1 g in sodium chloride 0.9 % 100 mL IVPB (0 g Intravenous Stopped 06/16/18 1614)  sodium chloride 0.9 % bolus 500 mL (0 mLs Intravenous Stopped 06/16/18 1614)  gadobutrol (GADAVIST) 1 MMOL/ML injection 7 mL (7 mLs Intravenous Contrast  Given 06/16/18 1625)  potassium chloride 10 mEq in 100 mL IVPB (10 mEq Intravenous New Bag/Given 06/16/18 2019)     Note:  This document was prepared using Dragon voice recognition software and may include unintentional dictation errors.  Nanda Quinton, MD Emergency Medicine    Long, [facility], MD 06/17/18 0932    Margette Fast, MD 06/25/18 1336

## 2018-06-16 NOTE — H&P (Signed)
History and Physical    Kaitlin Vargas MHD:[id number] DOB: 08-23-1950 DOA: 06/16/2018  PCP: [name], MD Consultants:  Mee Hives - endocrinology Patient coming from:  Home - lives with husband and daughter and grandson; NOK: Husband, [phone]  Chief Complaint: Back pain  HPI: Kaitlin Vargas is a 68 y.o. female with medical history significant of HTN; HLD; and DM presenting with weakness and back pain. She came in because she was feeling bad.  She has been unable to get up and walk due to back pain.  She has a B12 deficiency too.  She noticed problems with her back 4 weeks ago.  She came to the hospital due to cough and was diagnosed with adenovirus, but she kept coughing and didn't get better.  She has continued to cough after discharge.  And her back is constantly hurting.  She needed help getting out of bed and going to the bathroom.  Things got particularly worse 2-3 days ago.  She is having radicular symptoms down both legs.  +urinary incontinence, no stool incontinence.  The urinary issue may have been related to inability to get out of bed soon enough.  She was placed on prednisone during her last hospitalization and she had to stop taking it "because it felt like it was breaking my bones.  And ever since then it has been high."  +polyuria and polydipsia.  Difficulty taking PO.  Denies confusion.    ED Course:  Discharged 1 week ago due to respiratory issues.  Back pain x 2-3 weeks (did not mention).  WBC 22.  Given Rocephin for possible UTI.  CT concerning for diskitis.  MRI ordered.  Dr. Arnoldo Morale consulted, nothing to do if no neuro deficits.  No additional antibiotics given.  Normal lactate.  Does not need transfer to [place].  Review of Systems: As per HPI; otherwise review of systems reviewed and negative.   Ambulatory Status:  Ambulates without assistance  Past Medical History:  Diagnosis Date  . Anemia   . Diabetes mellitus without complication ([place])   . Hyperlipidemia   .  Hypertension   . Peripheral neuropathy     Past Surgical History:  Procedure Laterality Date  . ABDOMINAL HYSTERECTOMY      Social History   Socioeconomic History  . Marital status: Married    Spouse name: Not on file  . Number of children: 2  . Years of education: Not on file  . Highest education level: Not on file  Occupational History  . Occupation: Retired Surveyor, minerals  Social Needs  . Financial resource strain: Somewhat hard  . Food insecurity:    Worry: Never true    Inability: Never true  . Transportation needs:    Medical: Patient refused    Non-medical: Patient refused  Tobacco Use  . Smoking status: Never Smoker  . Smokeless tobacco: Never Used  Substance and Sexual Activity  . Alcohol use: Never    Frequency: Never  . Drug use: Never  . Sexual activity: Not Currently  Lifestyle  . Physical activity:    Days per week: Patient refused    Minutes per session: Patient refused  . Stress: To some extent  Relationships  . Social connections:    Talks on phone: Patient refused    Gets together: Patient refused    Attends religious service: Patient refused    Active member of club or organization: Patient refused    Attends meetings of clubs or organizations: Patient refused    Relationship status:  Patient refused  . Intimate partner violence:    Fear of current or ex partner: Patient refused    Emotionally abused: Patient refused    Physically abused: Patient refused    Forced sexual activity: Patient refused  Other Topics Concern  . Not on file  Social History Narrative  . Not on file    Allergies  Allergen Reactions  . Sulfa Antibiotics Rash    Family History  Problem Relation Age of Onset  . Cancer Mother   . Stroke Mother   . Cancer Father   . Leukemia Father     Prior to Admission medications   Medication Sig Start Date End Date Taking? Authorizing Provider  albuterol (PROVENTIL HFA;VENTOLIN HFA) 108 (90 Base) MCG/ACT inhaler Inhale  2 puffs into the lungs every 6 (six) hours as needed for wheezing or shortness of breath. 06/09/18   Manuella Ghazi, Pratik D, DO  aspirin 81 MG chewable tablet Chew 81 mg by mouth once.     [provider]  dextromethorphan-guaiFENesin (MUCINEX DM) 30-600 MG 12hr tablet Take 1 tablet by mouth 2 (two) times daily for 10 days. 06/09/18 06/19/18  Manuella Ghazi, Pratik D, DO  glipiZIDE (GLUCOTROL XL) 10 MG 24 hr tablet Take 10 mg by mouth 2 (two) times daily.  04/28/18   [provider]  lisinopril (PRINIVIL,ZESTRIL) 20 MG tablet Take 10 mg by mouth daily.  02/06/18   [provider]  metFORMIN (GLUCOPHAGE) 1000 MG tablet Take 1,000 mg by mouth 2 (two) times daily with a meal.  07/02/17 07/02/18  [provider]  metoprolol tartrate (LOPRESSOR) 25 MG tablet Take 25 mg by mouth daily.  01/31/18   [provider]  Multiple Vitamin (THERA) TABS Take 1 tablet by mouth daily.     [provider]  pantoprazole (PROTONIX) 40 MG tablet Take 1 tablet (40 mg total) by mouth daily. 06/09/18 06/09/19  Manuella Ghazi, Pratik D, DO  pioglitazone (ACTOS) 15 MG tablet Take 15 mg by mouth daily.  04/28/18   [provider]  vitamin B-12 (CYANOCOBALAMIN) 1000 MCG tablet Take 1 tablet (1,000 mcg total) by mouth daily for 30 days. 06/09/18 07/09/18  Heath Lark D, DO    Physical Exam: Vitals:   06/16/18 1247 06/16/18 1330 06/16/18 1500 06/16/18 1700  BP: 116/80 130/79 (!) 116/54 (!) 141/70  Pulse: (!) 124 (!) 123 (!) 109 97  Resp: 17 (!) 25 (!) 22 16  Temp:      TempSrc:      SpO2: 98% 97% 100% 100%  Weight:      Height:         . General:  Appears calm and comfortable and is NAD . Eyes:  PERRL, EOMI, normal lids, iris . ENT:  grossly normal hearing, lips & tongue, mmm . Neck:  no LAD, masses or thyromegaly . Cardiovascular:  RR with tachycardia, no m/r/g. No LE edema.  Marland Kitchen Respiratory:   CTA bilaterally with no wheezes/rales/rhonchi.  Normal respiratory effort. . Abdomen:  soft, mildly  diffusely tender, ND, NABS . Skin: callous formation with surrounding erythema and edema     . Musculoskeletal:  grossly normal tone BUE/BLE, good ROM, no bony abnormality . Psychiatric:  flat mood and affect, speech fluent and appropriate, AOx3 . Neurologic:  CN 2-12 grossly intact, moves all extremities in coordinated fashion, sensation intact    Radiological Exams on Admission: Mr Lumbar Spine W Wo Contrast  Result Date: 06/16/2018 CLINICAL DATA:  Low back pain. Findings concerning for L1-2  discitis-osteomyelitis on abdominal CT. EXAM: MRI LUMBAR SPINE WITHOUT AND WITH CONTRAST TECHNIQUE: Multiplanar and multiecho pulse sequences of the lumbar spine were obtained without and with intravenous contrast. CONTRAST:  7 mL Gadavist COMPARISON:  CT abdomen and pelvis 06/16/2018 FINDINGS: Segmentation:  Standard. Alignment:  Normal. Vertebrae: There are erosive changes involving the L2 superior greater than L1 inferior endplates with extensive marrow edema and enhancement in the L2 greater than L1 vertebral bodies. There is asymmetric moderate disc space loss at L1-2. There is at most minimal ventral epidural phlegmon at L1-2 without evidence of epidural abscess. Bone marrow signal elsewhere is mildly heterogeneous without a suspicious focal lesion identified. No fracture is evident. Conus medullaris and cauda equina: Conus extends to the upper L2 level. Conus and cauda equina appear normal. Paraspinal and other soft tissues: Extensive right-sided and milder left-sided paravertebral soft tissue inflammation centered at L1-2. Partially visualized rim enhancing fluid collections on the right extending from L1-2 superiorly into the region of the crus of the right hemidiaphragm with the larger collection measuring approximately 5 cm in length. Disc levels: T12-L1: Negative. L1-2: Discitis-osteomyelitis as described above. Mild left neural foraminal narrowing. No spinal stenosis. L2-3: Mild left facet  hypertrophy without disc herniation or stenosis. L3-4: Minimal disc bulging and mild facet hypertrophy without stenosis. L4-5: Disc bulging greater to the right and moderate facet hypertrophy result in mild right neural foraminal stenosis without spinal stenosis. Left larger than right facet joint effusions. L5-S1: Mild facet arthrosis without stenosis. 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 Electronically Signed   By: Logan Bores M.D.   On: 06/16/2018 17:00   Ct Renal Stone Study  Result Date: 06/16/2018 CLINICAL DATA:  Low back pain radiating into the stomach.  Nausea. EXAM: CT ABDOMEN AND PELVIS WITHOUT CONTRAST TECHNIQUE: Multidetector CT imaging of the abdomen and pelvis was performed following the standard protocol without IV contrast. COMPARISON:  None. FINDINGS: Lower chest: Heart size is mildly enlarged. Calcific coronary artery calcifications are identified. Mild dependent atelectasis noted. No pleural or pericardial effusion. Hepatobiliary: Multiple small stones are seen layering dependently within the gallbladder. There is no inflammatory change about the gallbladder. The liver and biliary tree appear normal. Pancreas: Unremarkable. No pancreatic ductal dilatation or surrounding inflammatory changes. The pancreas is atrophic. Spleen: Normal in size without focal abnormality. Adrenals/Urinary Tract: Small, benign left adrenal adenoma is noted. Right adrenal gland appears normal. The kidneys, ureters and urinary bladder are unremarkable. No hydronephrosis or stones. Stomach/Bowel: Stomach is within normal limits. Appendix appears normal. No evidence of bowel wall thickening, distention, or inflammatory changes. Vascular/Lymphatic: Aortic atherosclerosis. No enlarged abdominal or pelvic lymph nodes. Reproductive: Status post hysterectomy. No adnexal masses. Other: Trace amount of presacral fluid is noted.  Musculoskeletal: There is endplate destructive change at L1-2, much worse in the superior endplate of L2. Surrounding paraspinous inflammatory change is worse on the right. Imaged bones otherwise appear normal. IMPRESSION: Findings highly suspicious for discitis and osteomyelitis at L1-2 with associated paraspinous inflammatory change. MRI of the lumbar spine with and without contrast is recommended for further evaluation. Cardiomegaly. Gallstones without evidence of cholecystitis. Calcific aortic and coronary atherosclerosis. These results were called by telephone at the time of interpretation on 06/16/2018 at 3:39 pm to Dr. Nanda Quinton , who verbally acknowledged these results. Electronically Signed   By: Inge Rise M.D.   On: 06/16/2018 15:40    EKG: Independently reviewed.   1338 - Sinus tachycardia with rate  122; nonspecific ST changes with no evidence of acute ischemia  Labs on Admission: I have personally reviewed the available labs and imaging studies at the time of the admission.  Pertinent labs:   Na++ 132 CO2 14 Glucose 414 BUN 27/Creatinine 1.02/GFR 56 Anion gap >20 Albumin 2.5 Bili 2.8 Lactate 1.5 Blood cultures pending WBC 22.6 Hgb 11.6 Platelets 424 UA: >500 glucose; small hgb; 80 ketones; +nitrite; rare bacteria  Assessment/Plan Principal Problem:   Diskitis Active Problems:   Essential hypertension   DKA (diabetic ketoacidosis) (HCC)   Diabetic foot ulcer ([place])   Diskitis -Patient presenting with back pain and leukocytosis with CT concerning for diskitis/osteomyelitis. -MRI confirms infection with "extensive phlegmon and abscess in the right-sided paraspinous soft tissues." -Will need antibiotics for 4-6 weeks. -ESR, CRP, prealbumin, and HIV ordered and pending. -With vertebral osteo, needs testing for TB; quanitferon gold ordered. -Also checked UDS although patient clearly denies use. -Further important considerations include nutrition, diabetes  control. -Neurosurgical consultation performed via telephone from Dr. Laverta Baltimore; there does not appear to be a role for neurosurgery involvement at this time. -Vertebral osteo is more common in the L-spine and usually presents with severe pain; will give morphine. -Will need PT/OT consults once treatment has been initiated and pain control obtained. -IR consult requested for drainage of the abscess/phlegmon; will need gram stain, culture, and AFB smears. -Patient discussed by telephone with Dr. Linus Salmons from ID; it is appropriate to hold antibiotics at this time pending cultures obtained via IR.  DKA -Patient with poor baseline control - recent A1c was 8.5 on 1/18 -This was likely exacerbated by recent steroid burst. -Likely also related to current infection, as above. -Moderate DKA on admission based on HCO3 14-15, patient alert and possibly a bit drowsy, and significant ketonuria. -Will admit to SDU with DKA protocol -Would recommend continuing insulin drip at least until morning regardless of rapidity of closure of gap and normalization of labs -K+ normal at time of presentation and so potassium supplementation added -IVF at 150 cc/hr, NS until glucose <250 and then decrease rate to 125 and change to D51/2NS -Diabetic coordinator requested to consult on patient. -Hold PO medications including glipizide; glucophage; and Actos.  Diabetic foot ulcer -This was not reported by the ER physician but is somewhat concerning for osteomyelitis -This would be a secondary issue and likely warrants an MRI as well as antibiotics after the diskitis has been addressed -Will obtain foot xray for now -Diabetic foot infection order set utilized, including orders for CM, SW, DM coordinator, wound care, and nutrition consult. -Goal would be for glucose <150 to facilitate wound healing. -Patient should be on bed rest, non-weight bearing. -Excellent BP control is needed  -ABI has been ordered, as  well  HTN -Continue Lisinopril -She was taking short-acting Lopressor but only once daily; I halved the dose and ordered it BID as recommended.   DVT prophylaxis: Lovenox Code Status:  Full - confirmed with patient/family Family Communication: Family present throughout evaluation  Disposition Plan:  Home once clinically improved Consults called: Neurosurgery and ID consults via telephone; CM/diabetes coordinator; SW; wound care; nutrition  Admission status: Admit - It is my clinical opinion that admission to INPATIENT is reasonable and necessary because of the expectation that this patient will require hospital care that crosses at least 2 midnights to treat this condition based on the medical complexity of the problems presented.  Given the aforementioned information, the predictability of an adverse outcome is felt to be significant.  Karmen Bongo MD [facility]  If note is complete, please contact covering daytime or nighttime physician. [email or web address]   06/16/2018, 6:45 PM

## 2018-06-16 NOTE — ED Triage Notes (Signed)
Pt reports pain in lower back.  Denies injury.  Husband says pt has been laying around the house with no energy.

## 2018-06-16 NOTE — ED Notes (Signed)
Lab in room trying to draw blood work

## 2018-06-16 NOTE — ED Notes (Signed)
Pt assisted to BSC

## 2018-06-16 NOTE — ED Notes (Signed)
Pt complaining of lower back pain that radiates all the way around her stomach. Is  Nauseated, but not able to vomit

## 2018-06-17 ENCOUNTER — Inpatient Hospital Stay (HOSPITAL_COMMUNITY): Payer: Medicare HMO

## 2018-06-17 DIAGNOSIS — B9561 Methicillin susceptible Staphylococcus aureus infection as the cause of diseases classified elsewhere: Secondary | ICD-10-CM | POA: Diagnosis present

## 2018-06-17 DIAGNOSIS — E1169 Type 2 diabetes mellitus with other specified complication: Secondary | ICD-10-CM

## 2018-06-17 DIAGNOSIS — R7881 Bacteremia: Secondary | ICD-10-CM

## 2018-06-17 LAB — BASIC METABOLIC PANEL
Anion gap: 7 (ref 5–15)
Anion gap: 5 (ref 5–15)
Anion gap: 10 (ref 5–15)
Anion gap: 7 (ref 5–15)
BUN: 24 mg/dL — ABNORMAL HIGH (ref 8–23)
BUN: 23 mg/dL (ref 8–23)
BUN: 23 mg/dL (ref 8–23)
BUN: 20 mg/dL (ref 8–23)
CO2: 20 mmol/L — ABNORMAL LOW (ref 22–32)
CO2: 27 mmol/L (ref 22–32)
CO2: 23 mmol/L (ref 22–32)
CO2: 25 mmol/L (ref 22–32)
Calcium: 7.9 mg/dL — ABNORMAL LOW (ref 8.9–10.3)
Calcium: 8.4 mg/dL — ABNORMAL LOW (ref 8.9–10.3)
Calcium: 8.5 mg/dL — ABNORMAL LOW (ref 8.9–10.3)
Calcium: 8.3 mg/dL — ABNORMAL LOW (ref 8.9–10.3)
Chloride: 104 mmol/L (ref 98–111)
Chloride: 103 mmol/L (ref 98–111)
Chloride: 101 mmol/L (ref 98–111)
Chloride: 102 mmol/L (ref 98–111)
Creatinine, Ser: 0.63 mg/dL (ref 0.44–1.00)
Creatinine, Ser: 0.6 mg/dL (ref 0.44–1.00)
Creatinine, Ser: 0.61 mg/dL (ref 0.44–1.00)
Creatinine, Ser: 0.61 mg/dL (ref 0.44–1.00)
GFR calc Af Amer: >60 mL/min (ref >60)
GFR calc Af Amer: >60 mL/min (ref >60)
GFR calc Af Amer: >60 mL/min (ref >60)
GFR calc Af Amer: >60 mL/min (ref >60)
GFR calc non Af Amer: >60 mL/min (ref >60)
GFR calc non Af Amer: >60 mL/min (ref >60)
Glucose, Bld: 131 mg/dL — ABNORMAL HIGH (ref 70–99)
Glucose, Bld: 177 mg/dL — ABNORMAL HIGH (ref 70–99)
Glucose, Bld: 247 mg/dL — ABNORMAL HIGH (ref 70–99)
Glucose, Bld: 93 mg/dL (ref 70–99)
POTASSIUM: 3.5 mmol/L (ref 3.5–5.1)
Potassium: 5.4 mmol/L — ABNORMAL HIGH (ref 3.5–5.1)
Potassium: 4.2 mmol/L (ref 3.5–5.1)
Potassium: 3.2 mmol/L — ABNORMAL LOW (ref 3.5–5.1)
SODIUM: 131 mmol/L — AB (ref 135–145)
SODIUM: 134 mmol/L — AB (ref 135–145)
Sodium: 135 mmol/L (ref 135–145)
Sodium: 134 mmol/L — ABNORMAL LOW (ref 135–145)

## 2018-06-17 LAB — BLOOD CULTURE ID PANEL (REFLEXED)
ACINETOBACTER BAUMANNII: NOT DETECTED
Candida albicans: NOT DETECTED
Candida glabrata: NOT DETECTED
Candida krusei: NOT DETECTED
Candida parapsilosis: NOT DETECTED
Candida tropicalis: NOT DETECTED
ENTEROBACTERIACEAE SPECIES: NOT DETECTED
Enterobacter cloacae complex: NOT DETECTED
Enterococcus species: NOT DETECTED
Escherichia coli: NOT DETECTED
HAEMOPHILUS INFLUENZAE: NOT DETECTED
Klebsiella oxytoca: NOT DETECTED
Klebsiella pneumoniae: NOT DETECTED
Listeria monocytogenes: NOT DETECTED
Methicillin resistance: NOT DETECTED
Neisseria meningitidis: NOT DETECTED
Proteus species: NOT DETECTED
Pseudomonas aeruginosa: NOT DETECTED
STREPTOCOCCUS PNEUMONIAE: NOT DETECTED
Serratia marcescens: NOT DETECTED
Staphylococcus aureus (BCID): DETECTED — AB
Staphylococcus species: DETECTED — AB
Streptococcus agalactiae: NOT DETECTED
Streptococcus pyogenes: NOT DETECTED
Streptococcus species: NOT DETECTED

## 2018-06-17 LAB — GLUCOSE, CAPILLARY
GLUCOSE-CAPILLARY: 108 mg/dL — AB (ref 70–99)
GLUCOSE-CAPILLARY: 152 mg/dL — AB (ref 70–99)
GLUCOSE-CAPILLARY: 167 mg/dL — AB (ref 70–99)
GLUCOSE-CAPILLARY: 175 mg/dL — AB (ref 70–99)
Glucose-Capillary: 158 mg/dL — ABNORMAL HIGH (ref 70–99)
Glucose-Capillary: 130 mg/dL — ABNORMAL HIGH (ref 70–99)
Glucose-Capillary: 108 mg/dL — ABNORMAL HIGH (ref 70–99)
Glucose-Capillary: 162 mg/dL — ABNORMAL HIGH (ref 70–99)
Glucose-Capillary: 199 mg/dL — ABNORMAL HIGH (ref 70–99)
Glucose-Capillary: 247 mg/dL — ABNORMAL HIGH (ref 70–99)
Glucose-Capillary: 222 mg/dL — ABNORMAL HIGH (ref 70–99)
Glucose-Capillary: 191 mg/dL — ABNORMAL HIGH (ref 70–99)
Glucose-Capillary: 93 mg/dL (ref 70–99)

## 2018-06-17 LAB — PROTIME-INR
INR: 1.04
Prothrombin Time: 13.5 seconds (ref 11.4–15.2)

## 2018-06-17 LAB — C-REACTIVE PROTEIN: CRP: 17.4 mg/dL — ABNORMAL HIGH (ref <1.0)

## 2018-06-17 LAB — PREALBUMIN: Prealbumin: <5 mg/dL — ABNORMAL LOW (ref 18–38)

## 2018-06-17 MED ORDER — GLUCERNA SHAKE PO LIQD
237.0000 mL | Freq: Two times a day (BID) | ORAL | Status: DC
  Administered 2018-06-19 – 2018-06-23 (×8): 237 mL via ORAL

## 2018-06-17 MED ORDER — POTASSIUM CHLORIDE CRYS ER 20 MEQ PO TBCR
40.0000 meq | EXTENDED_RELEASE_TABLET | ORAL | Status: AC
  Administered 2018-06-17 (×2): 40 meq via ORAL
  Filled 2018-06-17 (×2): qty 2

## 2018-06-17 MED ORDER — INSULIN GLARGINE 100 UNIT/ML ~~LOC~~ SOLN
15.0000 [IU] | Freq: Every day | SUBCUTANEOUS | Status: DC
  Administered 2018-06-17 – 2018-06-22 (×6): 15 [IU] via SUBCUTANEOUS
  Filled 2018-06-17 (×8): qty 0.15

## 2018-06-17 MED ORDER — PRO-STAT SUGAR FREE PO LIQD
30.0000 mL | Freq: Two times a day (BID) | ORAL | Status: DC
  Administered 2018-06-17 – 2018-06-26 (×17): 30 mL via ORAL
  Filled 2018-06-17 (×16): qty 30

## 2018-06-17 MED ORDER — INSULIN ASPART 100 UNIT/ML ~~LOC~~ SOLN
0.0000 [IU] | Freq: Three times a day (TID) | SUBCUTANEOUS | Status: DC
  Administered 2018-06-18: 2 [IU] via SUBCUTANEOUS
  Administered 2018-06-18 (×2): 5 [IU] via SUBCUTANEOUS
  Administered 2018-06-19: 3 [IU] via SUBCUTANEOUS
  Administered 2018-06-19: 11 [IU] via SUBCUTANEOUS
  Administered 2018-06-19: 5 [IU] via SUBCUTANEOUS
  Administered 2018-06-20 (×2): 3 [IU] via SUBCUTANEOUS
  Administered 2018-06-20: 5 [IU] via SUBCUTANEOUS
  Administered 2018-06-21: 8 [IU] via SUBCUTANEOUS
  Administered 2018-06-21: 5 [IU] via SUBCUTANEOUS
  Administered 2018-06-21: 2 [IU] via SUBCUTANEOUS
  Administered 2018-06-22: 5 [IU] via SUBCUTANEOUS
  Administered 2018-06-22: 3 [IU] via SUBCUTANEOUS
  Administered 2018-06-22: 5 [IU] via SUBCUTANEOUS
  Administered 2018-06-23 (×3): 3 [IU] via SUBCUTANEOUS
  Administered 2018-06-24: 5 [IU] via SUBCUTANEOUS
  Administered 2018-06-24 – 2018-06-25 (×3): 3 [IU] via SUBCUTANEOUS
  Administered 2018-06-25 (×2): 5 [IU] via SUBCUTANEOUS
  Administered 2018-06-26: 3 [IU] via SUBCUTANEOUS

## 2018-06-17 MED ORDER — INSULIN ASPART 100 UNIT/ML ~~LOC~~ SOLN
0.0000 [IU] | Freq: Every day | SUBCUTANEOUS | Status: DC
  Administered 2018-06-19: 2 [IU] via SUBCUTANEOUS
  Administered 2018-06-24: 3 [IU] via SUBCUTANEOUS

## 2018-06-17 MED ORDER — CEFAZOLIN SODIUM-DEXTROSE 2-4 GM/100ML-% IV SOLN
2.0000 g | Freq: Three times a day (TID) | INTRAVENOUS | Status: DC
  Administered 2018-06-17 – 2018-06-26 (×28): 2 g via INTRAVENOUS
  Filled 2018-06-17 (×29): qty 100

## 2018-06-17 MED ORDER — INSULIN GLARGINE 100 UNIT/ML ~~LOC~~ SOLN: 10.0000 [IU] | Freq: Every day | SUBCUTANEOUS | Status: DC

## 2018-06-17 MED ORDER — GLUCERNA SHAKE PO LIQD: 237.0000 mL | Freq: Three times a day (TID) | ORAL | Status: DC

## 2018-06-17 NOTE — Consult Note (Signed)
[facility] Nurse wound consult note completed by use of Elink and assistance of bedside nurse Reason for Consult: foot wound  Wound type:neuropathic foot ulceration in the presence of DM Xray did not indicate osteomyelitis, MD to add MRI if needed.  Pressure Injury POA: NA Measurement: 3cm x 0.5cm x 0cm  Wound bed: scabbed area with hyperkeratotic skin along the proximal edge of the wound bed Drainage (amount, consistency, odor) none Periwound: edema, circumferential erythema, requested bedside nurse to mark current status of erythema    Dressing procedure/placement/frequency: No real topical therapy needed. Requested bedside nurse to protect area with silicone foam. Would suggest follow up with Dr. Irving Shows (she is followed for podiatry needs) as an outpatient.   Discussed POC with patient and bedside nurse.  Re consult if needed, will not follow at this time. Thanks  Ivadell Gaul [employer], RN,CWOCN, CNS, [place] [phone])

## 2018-06-17 NOTE — Progress Notes (Signed)
Initial Nutrition Assessment  DOCUMENTATION CODES:  Obesity unspecified  INTERVENTION:  Glucerna Shake po BID, each supplement provides 220 kcal and 10 grams of protein  Will order 30 mL Prostat BID, each supplement provides 100 kcal and 15 grams of protein.  Reviewed Diet recall to assess compliance w/ DM diet- no problem areas identified   NUTRITION DIAGNOSIS:  Increased nutrient needs related to wound healing as evidenced by estimated nutrition requires for this condition.   GOAL:  Patient will meet greater than or equal to 90% of their needs  MONITOR:  PO intake, Labs, I & O's, Supplement acceptance, Diet advancement  REASON FOR ASSESSMENT:  Consult Wound healing  ASSESSMENT:  68 y/o female w/ PMHx of HTN/HLD, DM2. Presented with weakness and back pain, latter of which she has had x4 weeks. Had just been admitted 1/18-1/20 d/t cough and was dx w/ adenovirus. Now represents with continued cough, back pain and new urinary incontinence. Work up revealed large abscess in R paraspinous tissues. Also noted to have a R diabetic foot ulcer and be in DKA. RD consulted for wound healing.   Patient reports having a substantial decrease in her appetite this past month. She estimates she has been eating <50% of her normal. She attributes most of this  to a lack of appetite caused by recent viral infection. She says this starts 2 weeks ago.   At her baseline, she reports following a diabetic diet. RD took a brief diet recall to assess diet quality. She says she does not skip meals. She does not drink Juice, soda or other sugary beverages. She says she tries to include vegetables at her meals. A typical breakfast for her is an egg +white toast w/ apple sauce on it. Lunch "may be a hotdog". She for dinner "I may try to make some pintos". She denies snacking on sugary items. She says she takes a MVI. Also reports checking her sugar daily and says it avgs ~150.   Weight wise, she does feel thinner  and believes she has lost weight over the past month. She says she thinks her UBW is 162 lbs. Per chart, this is roughly what she currently weighs. She has serial weights in chart from frequent encounters w/ [facility]. Per review of these encounters, up until the past month, her weight had been stable at 168-172 since last June.   At this time, the pt says she is not too hungry, which she attributes to being on the insulin drip. She also has some nausea. She denies v/c/d. She is agreeable to oral supplements on arrival. RD emphasized importance of glycemic control for wound healing-stressed need to be compliant with insulin, medications and diet on D/C.   Labs: BG: 190-220, A1c: 8.5%, B12: 7 Meds: Insulin gtt, ppi, b12, IVF  Recent Labs  Lab 06/17/18 0216 06/17/18 0549 06/17/18 0853  NA 131* 135 134*  K 5.4* 4.2 3.5  CL 104 103 101  CO2 20* 27 23  BUN 24* 23 23  CREATININE 0.63 0.60 0.61  CALCIUM 7.9* 8.4* 8.5*  GLUCOSE 131* 177* 247*   NUTRITION - FOCUSED PHYSICAL EXAM:   Most Recent Value  Orbital Region  No depletion  Upper Arm Region  No depletion  Thoracic and Lumbar Region  No depletion  Buccal Region  No depletion  Temple Region  No depletion  Clavicle Bone Region  No depletion  Clavicle and Acromion Bone Region  No depletion  Scapular Bone Region  No depletion  Dorsal  Hand  No depletion  Patellar Region  No depletion  Anterior Thigh Region  No depletion  Posterior Calf Region  No depletion  Hair  Reviewed  Eyes  Reviewed  Mouth  Reviewed  Skin  Reviewed  Nails  Reviewed     Diet Order:   Diet Order            Diet NPO time specified Except for: Sips with Meds  Diet effective midnight        Diet heart healthy/carb modified Room service appropriate? Yes; Fluid consistency: Thin  Diet effective now             EDUCATION NEEDS:  No education needs have been identified at this time  Skin: Diabetic Ulcer- R foot  Last BM:  1/26  Height:  Ht Readings from  Last 1 Encounters:  06/16/18 4\' 11"  (1.499 m)   Weight:  Wt Readings from Last 1 Encounters:  06/17/18 73.2 kg   Wt Readings from Last 10 Encounters:  06/17/18 73.2 kg  06/07/18 72.4 kg  Via Care Everywhere:  06/02/2018: 162 lbs 04/28/2018: 168.75 lbs 04/14/2018: 168 lbs 03/03/2018: 172 lbs 01/27/2018: 174 lbs 12/12/2017: 171 lbs 10/22/2017: 172 lbs  Ideal Body Weight:  44.7 kg  BMI:  Body mass index is 32.59 kg/m.  Estimated Nutritional Needs:  Kcal:  1450-1600 (20-22 kcals/kg bw) Protein:  65-80g Pro (1.5-1.8 g/kg bw) Fluid:  1.4-1.6 L fluid (1 ml/kcal)  Burtis Junes RD, LDN, CNSC Clinical Nutrition Available Tues-Sat via Pager: [phone] 06/17/2018 1:53 PM

## 2018-06-17 NOTE — Care Management Note (Deleted)
Case Management Note  Patient Details  Name: Kaitlin Vargas MRN: [id number] Date of Birth: 12-23-1950   If discussed at [place] Length of Stay Meetings, dates discussed:  06/17/18  Additional Comments:  Mckinnley Cottier, Chauncey Reading, RN 06/17/2018, 12:17 PM

## 2018-06-17 NOTE — Progress Notes (Signed)
Phone call to [facility] ICU RN to get pt time moved back for arrive at [facility] at 1030, procedure at 12. RN states that he will contact [facility].

## 2018-06-17 NOTE — Progress Notes (Signed)
Called [facility] for transport to [facility] for tomorrow patient has to be there at 0930 AM

## 2018-06-17 NOTE — Progress Notes (Signed)
PROGRESS NOTE    Kaitlin Vargas  ION:[id number] DOB: 05-28-1950 DOA: 06/16/2018 PCP: [name], MD    Brief Narrative:  68 year old female history of hypertension, diabetes, presents to the hospital with complaints of back pain.  She was having radicular symptoms down both legs.  She underwent MRI that indicated discitis with phlegmon and possible abscess.  She was admitted to the hospital for IV antibiotics.  Blood cultures have been positive for MSSA.  She is undergoing further work-up with echocardiogram.  She also has an infection in her foot for which she will receive MRI to rule out osteomyelitis.   Assessment & Plan:   Principal Problem:   Diskitis Active Problems:   Essential hypertension   DKA (diabetic ketoacidosis) ([place])   Diabetic foot ulcer ([place])   1. MSSA bacteremia.  Currently on cefazolin.  Complicating features include discitis.  It is possible that original source is likely cellulitis on her right foot.  She will undergo echocardiogram.  Will likely need prolonged course of antibiotics.  Repeat blood cultures in a.m. 2. Discitis.  Patient complains of significant back pain.  She is still able to ambulate and does not have any new neurologic deficits.  MRI indicates a phlegmon with underlying abscess.  She will go to interventional radiology for drainage in a.m. 3. Diabetic foot ulcer.  ABI performed her 1.0 bilaterally.  She is currently on IV antibiotics.  Will check MRI of the foot to rule out underlying osteomyelitis 4. Diabetic ketoacidosis.  Improved with intravenous insulin and IV fluids.  Patient reports that her blood sugars got substantially worse after being started on prednisone for upper respiratory tract illness last week.  She will be transitioned to subcutaneous insulin.  Continue to monitor blood sugars. 5. Hypertension.  Continue on lisinopril and metoprolol. 6. Possible urinary tract infection.  Urinalysis positive for nitrates.  Urine culture in  process.  She is already on antibiotics.   DVT prophylaxis: Lovenox Code Status: Full code Family Communication: Discussed with multiple family numbers at bedside Disposition Plan: Pending hospital course, may need placement   Consultants:   Interventional radiology  Procedures:     Antimicrobials:   Ancef 1/28 >   Subjective: Continues to complain of back pain.  No shortness of breath.  She has help standing up, she is able to walk.  Objective: Vitals:   06/17/18 1400 06/17/18 1500 06/17/18 1513 06/17/18 1600  BP:   135/68   Pulse: 84 79 79   Resp: (!) 25 19 18    Temp:    98.1 F (36.7 C)  TempSrc:    Oral  SpO2: 98% 97% 98%   Weight:      Height:        Intake/Output Summary (Last 24 hours) at 06/17/2018 1852 Last data filed at 06/17/2018 1500 Gross per 24 hour  Intake 2665.83 ml  Output -  Net 2665.83 ml   Filed Weights   06/16/18 1038 06/16/18 2115 06/17/18 0600  Weight: 73.9 kg 72.5 kg 73.2 kg    Examination:  General exam: Appears calm and comfortable  Respiratory system: Clear to auscultation. Respiratory effort normal. Cardiovascular system: S1 & S2 heard, RRR. No JVD, murmurs, rubs, gallops or clicks. No pedal edema. Gastrointestinal system: Abdomen is nondistended, soft and nontender. No organomegaly or masses felt. Normal bowel sounds heard. Central nervous system: Alert and oriented. No focal neurological deficits. Extremities: Symmetric 5 x 5 power. Skin: Plantar aspect of right foot is large callus with surrounding swelling. Psychiatry:  Judgement and insight appear normal. Mood & affect appropriate.     Data Reviewed: I have personally reviewed following labs and imaging studies  CBC: Recent Labs  Lab 06/16/18 1244  WBC 22.6*  NEUTROABS 20.4*  HGB 11.6*  HCT 38.3  MCV 107.3*  PLT 093*   Basic Metabolic Panel: Recent Labs  Lab 06/16/18 2224 06/17/18 0216 06/17/18 0549 06/17/18 0853 06/17/18 1413  NA 132* 131* 135 134*  134*  K 3.5 5.4* 4.2 3.5 3.2*  CL 99 104 103 101 102  CO2 19* 20* 27 23 25   GLUCOSE 287* 131* 177* 247* 93  BUN 25* 24* 23 23 20   CREATININE 0.81 0.63 0.60 0.61 0.61  CALCIUM 8.3* 7.9* 8.4* 8.5* 8.3*   GFR: Estimated Creatinine Clearance: 57.8 mL/min (by C-G formula based on SCr of 0.61 mg/dL). Liver Function Tests: Recent Labs  Lab 06/16/18 1451  AST 12*  ALT 10  ALKPHOS 103  BILITOT 2.8*  PROT 7.1  ALBUMIN 2.5*   Recent Labs  Lab 06/16/18 1451  LIPASE 17   No results for input(s): AMMONIA in the last 168 hours. Coagulation Profile: Recent Labs  Lab 06/17/18 0853  INR 1.04   Cardiac Enzymes: No results for input(s): CKTOTAL, CKMB, CKMBINDEX, TROPONINI in the last 168 hours. BNP (last 3 results) No results for input(s): PROBNP in the last 8760 hours. HbA1C: No results for input(s): HGBA1C in the last 72 hours. CBG: Recent Labs  Lab 06/17/18 0701 06/17/18 0818 06/17/18 1004 06/17/18 1130 06/17/18 1639  GLUCAP 199* 247* 222* 191* 93   Lipid Profile: No results for input(s): CHOL, HDL, LDLCALC, TRIG, CHOLHDL, LDLDIRECT in the last 72 hours. Thyroid Function Tests: No results for input(s): TSH, T4TOTAL, FREET4, T3FREE, THYROIDAB in the last 72 hours. Anemia Panel: No results for input(s): VITAMINB12, FOLATE, FERRITIN, TIBC, IRON, RETICCTPCT in the last 72 hours. Sepsis Labs: Recent Labs  Lab 06/16/18 1451 06/16/18 1750  LATICACIDVEN 1.5 1.1    Recent Results (from the past 240 hour(s))  Culture, blood (routine x 2)     Status: None (Preliminary result)   Collection Time: 06/16/18  1:53 PM  Result Value Ref Range Status   Specimen Description   Final    LEFT ANTECUBITAL Performed at [facility], [address] [place], [place] [address]    Special Requests   Final    BOTTLES DRAWN AEROBIC ONLY Blood Culture results may not be optimal due to an inadequate volume of blood received in culture bottles Performed at [facility], [address]  [place], [place] [address]    Culture  Setup Time   Final    AEROBIC BOTTLE ONLY GRAM POSITIVE COCCI Gram Stain Report Called to,Read Back By and Verified With: J HEARN,RN @0338  06/17/18 [facility] Performed at [facility], [address], [place], [place] [address]    Culture [facility] POSITIVE COCCI  Final   Report Status PENDING  Incomplete  Culture, blood (routine x 2)     Status: None (Preliminary result)   Collection Time: 06/16/18  2:51 PM  Result Value Ref Range Status   Specimen Description   Final    BLOOD BLOOD RIGHT WRIST Performed at [facility], [address], [place], [place] [address]    Special Requests   Final    BOTTLES DRAWN AEROBIC AND ANAEROBIC Blood Culture results may not be optimal due to an inadequate volume of blood received in culture bottles Performed at [facility], [address], [place],  [address]  Culture  Setup Time   Final    IN BOTH AEROBIC AND ANAEROBIC BOTTLES GRAM POSITIVE COCCI Gram Stain Report Called to,Read Back By and Verified With: J HEARN,RN @0338  06/17/18 [facility] CRITICAL RESULT CALLED TO, READ BACK BY AND VERIFIED WITH: PHARMD L POOLE [id number] MLM Performed at [facility], [place] [address], [place], [place] [address]    Culture GRAM POSITIVE COCCI  Final   Report Status PENDING  Incomplete  Blood Culture ID Panel (Reflexed)     Status: Abnormal   Collection Time: 06/16/18  2:51 PM  Result Value Ref Range Status   Enterococcus species NOT DETECTED NOT DETECTED Final   Listeria monocytogenes NOT DETECTED NOT DETECTED Final   Staphylococcus species DETECTED (A) NOT DETECTED Final    Comment: CRITICAL RESULT CALLED TO, READ BACK BY AND VERIFIED WITH: PHARMD L POOLE [id number] 0800 MLM    Staphylococcus aureus (BCID) DETECTED (A) NOT DETECTED Final    Comment: Methicillin (oxacillin) susceptible Staphylococcus aureus (MSSA). Preferred therapy is anti staphylococcal beta lactam antibiotic (Cefazolin or Nafcillin), unless clinically  contraindicated. CRITICAL RESULT CALLED TO, READ BACK BY AND VERIFIED WITH: PHARMD L POOLE [id number] [phone] MLM    Methicillin resistance NOT DETECTED NOT DETECTED Final   Streptococcus species NOT DETECTED NOT DETECTED Final   Streptococcus agalactiae NOT DETECTED NOT DETECTED Final   Streptococcus pneumoniae NOT DETECTED NOT DETECTED Final   Streptococcus pyogenes NOT DETECTED NOT DETECTED Final   Acinetobacter baumannii NOT DETECTED NOT DETECTED Final   Enterobacteriaceae species NOT DETECTED NOT DETECTED Final   Enterobacter cloacae complex NOT DETECTED NOT DETECTED Final   Escherichia coli NOT DETECTED NOT DETECTED Final   Klebsiella oxytoca NOT DETECTED NOT DETECTED Final   Klebsiella pneumoniae NOT DETECTED NOT DETECTED Final   Proteus species NOT DETECTED NOT DETECTED Final   Serratia marcescens NOT DETECTED NOT DETECTED Final   Haemophilus influenzae NOT DETECTED NOT DETECTED Final   Neisseria meningitidis NOT DETECTED NOT DETECTED Final   Pseudomonas aeruginosa NOT DETECTED NOT DETECTED Final   Candida albicans NOT DETECTED NOT DETECTED Final   Candida glabrata NOT DETECTED NOT DETECTED Final   Candida krusei NOT DETECTED NOT DETECTED Final   Candida parapsilosis NOT DETECTED NOT DETECTED Final   Candida tropicalis NOT DETECTED NOT DETECTED Final    Comment: Performed at [facility], [place]. [address], [place], [place] [address]         Radiology Studies: Mr Lumbar Spine W Wo Contrast  Result Date: 06/16/2018 CLINICAL DATA:  Low back pain. Findings concerning for L1-2 discitis-osteomyelitis on abdominal CT. EXAM: MRI LUMBAR SPINE WITHOUT AND WITH CONTRAST TECHNIQUE: Multiplanar and multiecho pulse sequences of the lumbar spine were obtained without and with intravenous contrast. CONTRAST:  7 mL Gadavist COMPARISON:  CT abdomen and pelvis 06/16/2018 FINDINGS: Segmentation:  Standard. Alignment:  Normal. Vertebrae: There are erosive changes involving the L2 superior greater  than L1 inferior endplates with extensive marrow edema and enhancement in the L2 greater than L1 vertebral bodies. There is asymmetric moderate disc space loss at L1-2. There is at most minimal ventral epidural phlegmon at L1-2 without evidence of epidural abscess. Bone marrow signal elsewhere is mildly heterogeneous without a suspicious focal lesion identified. No fracture is evident. Conus medullaris and cauda equina: Conus extends to the upper L2 level. Conus and cauda equina appear normal. Paraspinal and other soft tissues: Extensive right-sided and milder left-sided paravertebral soft tissue inflammation centered at L1-2. Partially visualized rim enhancing fluid  collections on the right extending from L1-2 superiorly into the region of the crus of the right hemidiaphragm with the larger collection measuring approximately 5 cm in length. Disc levels: T12-L1: Negative. L1-2: Discitis-osteomyelitis as described above. Mild left neural foraminal narrowing. No spinal stenosis. L2-3: Mild left facet hypertrophy without disc herniation or stenosis. L3-4: Minimal disc bulging and mild facet hypertrophy without stenosis. L4-5: Disc bulging greater to the right and moderate facet hypertrophy result in mild right neural foraminal stenosis without spinal stenosis. Left larger than right facet joint effusions. L5-S1: Mild facet arthrosis without stenosis. 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 Electronically Signed   By: [place] Bores M.D.   On: 06/16/2018 17:00   US Arterial Abi (screening Lower Extremity)  Result Date: 06/17/2018 CLINICAL DATA:  Diabetes, right medial diabetic wound ulcer x4 days. Hypertension, hyperlipidemia. EXAM: NONINVASIVE PHYSIOLOGIC VASCULAR STUDY OF BILATERAL LOWER EXTREMITIES TECHNIQUE: Evaluation of both lower extremities were performed at rest, including calculation of ankle-brachial indices with single level  Doppler, pressure and pulse volume recording. COMPARISON:  None. FINDINGS: Right ABI:  1.06 Left ABI:  1.0 Right Lower Extremity:  Normal arterial waveforms at the ankle. Left Lower Extremity:  Normal arterial waveforms at the ankle. IMPRESSION: No evidence of hemodynamically significant lower extremity arterial occlusive disease at rest. Electronically Signed   By: Lucrezia Europe M.D.   On: 06/17/2018 10:16   Dg Foot Complete Right  Result Date: 06/16/2018 CLINICAL DATA:  68 year old female with foot ulcer EXAM: RIGHT FOOT COMPLETE - 3+ VIEW COMPARISON:  None. FINDINGS: Circumferential soft tissue swelling of the forefoot. No radiopaque foreign body. No lytic changes visualized. Degenerative changes of the interphalangeal joints. Neuropathic midfoot changes including sclerotic changes, disruption of the Lisfranc joint/disorganization, loss of normal joint spaces of the cuneiform is a and cuboid as well as the cuboid navicular joint. Degenerative changes of the hindfoot. IMPRESSION: Circumferential soft tissue swelling of the forefoot, should be amenable to direct inspection. Neuropathic changes of the midfoot, including disruption of the Lisfranc joint. Electronically Signed   By: Corrie Mckusick D.O.   On: 06/16/2018 19:44   Ct Renal Stone Study  Result Date: 06/16/2018 CLINICAL DATA:  Low back pain radiating into the stomach.  Nausea. EXAM: CT ABDOMEN AND PELVIS WITHOUT CONTRAST TECHNIQUE: Multidetector CT imaging of the abdomen and pelvis was performed following the standard protocol without IV contrast. COMPARISON:  None. FINDINGS: Lower chest: Heart size is mildly enlarged. Calcific coronary artery calcifications are identified. Mild dependent atelectasis noted. No pleural or pericardial effusion. Hepatobiliary: Multiple small stones are seen layering dependently within the gallbladder. There is no inflammatory change about the gallbladder. The liver and biliary tree appear normal. Pancreas: Unremarkable. No  pancreatic ductal dilatation or surrounding inflammatory changes. The pancreas is atrophic. Spleen: Normal in size without focal abnormality. Adrenals/Urinary Tract: Small, benign left adrenal adenoma is noted. Right adrenal gland appears normal. The kidneys, ureters and urinary bladder are unremarkable. No hydronephrosis or stones. Stomach/Bowel: Stomach is within normal limits. Appendix appears normal. No evidence of bowel wall thickening, distention, or inflammatory changes. Vascular/Lymphatic: Aortic atherosclerosis. No enlarged abdominal or pelvic lymph nodes. Reproductive: Status post hysterectomy. No adnexal masses. Other: Trace amount of presacral fluid is noted. Musculoskeletal: There is endplate destructive change at L1-2, much worse in the superior endplate of L2. Surrounding paraspinous inflammatory change is worse on the right. Imaged bones otherwise appear normal. IMPRESSION: Findings highly suspicious for discitis and osteomyelitis at L1-2  with associated paraspinous inflammatory change. MRI of the lumbar spine with and without contrast is recommended for further evaluation. Cardiomegaly. Gallstones without evidence of cholecystitis. Calcific aortic and coronary atherosclerosis. These results were called by telephone at the time of interpretation on 06/16/2018 at 3:39 pm to Dr. Nanda Quinton , who verbally acknowledged these results. Electronically Signed   By: Inge Rise M.D.   On: 06/16/2018 15:40        Scheduled Meds: . aspirin  81 mg Oral Daily  . feeding supplement (GLUCERNA SHAKE)  237 mL Oral BID BM  . feeding supplement (PRO-STAT SUGAR FREE 64)  30 mL Oral BID  . insulin aspart  0-15 Units Subcutaneous TID WC  . insulin aspart  0-5 Units Subcutaneous QHS  . insulin glargine  15 Units Subcutaneous Daily  . lisinopril  10 mg Oral Daily  . metoprolol tartrate  12.5 mg Oral BID  . pantoprazole  40 mg Oral Daily  . potassium chloride  40 mEq Oral Q3H  . vitamin B-12  1,000 mcg  Oral Daily   Continuous Infusions: .  ceFAZolin (ANCEF) IV 2 g (06/17/18 1328)     LOS: 1 day    Time spent: 75mins    Kathie Dike, MD [facility]   If 7PM-7AM, please contact night-coverage [email or web address]  06/17/2018, 6:52 PM

## 2018-06-17 NOTE — Progress Notes (Addendum)
Inpatient Diabetes Program Recommendations  AACE/ADA: New Consensus Statement on Inpatient Glycemic Control  Target Ranges:  Prepandial:   less than 140 mg/dL      Peak postprandial:   less than 180 mg/dL (1-2 hours)      Critically ill patients:  140 - 180 mg/dL   Results for Kaitlin Vargas, Kaitlin Vargas (MRN [id number]) as of 06/17/2018 07:28  Ref. Range 06/17/2018 00:07 06/17/2018 01:12 06/17/2018 02:05 06/17/2018 03:07 06/17/2018 03:56 06/17/2018 05:03 06/17/2018 06:03  Glucose-Capillary Latest Ref Range: 70 - 99 mg/dL 175 (H) 158 (H) 130 (H) 108 (H) 108 (H) 162 (H) 152 (H)  Results for Kaitlin Vargas, Kaitlin Vargas (MRN [id number]) as of 06/17/2018 07:28  Ref. Range 06/16/2018 14:51  Glucose Latest Ref Range: 70 - 99 mg/dL 414 (H)   Results for Kaitlin Vargas, Kaitlin Vargas (MRN [id number]) as of 06/17/2018 07:28  Ref. Range 06/07/2018 12:09  Hemoglobin A1C Latest Ref Range: 4.8 - 5.6 % 8.5 (H)   Review of Glycemic Control  Diabetes history: DM2 Outpatient Diabetes medications: Glipizide XL 10 mg BID, Metformin 1000 mg BID, Actos 30 mg daily, was taking Soliqua (glargine & lixisenatide) but stopped b/c of side effect of N/V Current orders for Inpatient glycemic control: IV insulin drip per DKA order set  Inpatient Diabetes Program Recommendations:   Insulin at Transition: At time of transition from IV to SQ insulin, please consider ordering Lantus 7 units Q24H (based on 73 kg x 0.1 units) and CBGs with Novolog 0-9 units Q4H.  HgbA1C: A1C 8.5% on 06/07/18 indicating an average glucose of 197 mg/dl over the past 2-3 months. Patient notes that she was taking [place] and she stopped it taking it in mid December due to side effect of N/V. Recommend patient follow up with Dr. Hartford Poli and she may need to be prescribed basal insulin.  NOTE: Noted consult for Diabetes Coordinator. Chart reviewed. Noted patient was recently inpatient from 06/07/18 to 06/09/18 and patient was discharged on a 5 day Prednisone taper. Per H&P, patient did not complete Prednisone taper  "because it felt like it was breaking my bones."  Anticipate steroids and infection contributing to noted hyperglycemia. Patient is currently still on IV insulin drip and most recent labs indicate acidosis is resolved.   Addendum 06/17/18@8 :37- Spoke with patient over the phone about diabetes and home regimen for diabetes control. Patient reports that she is followed by Dr. Hartford Poli for diabetes management and her last visit with Dr. Hartford Poli was on 04/28/18.  Patient reports that she is taking Glipizide XL 10 mg BID, Metformin 1000 mg BID, Actos 30 mg daily as an outpatient for diabetes control. Patient reports that she was taking a shot for DM and she stopped taking it because it made her have nausea and vomiting.  In reviewing office note for Dr. Hartford Poli on 04/28/18, noted patient was prescribed Soliqua 100-33 unit-mcg/ml 15 units (glargine and lixisenatide).   Patient reports that she was checking glucose and when she was taking the oral DM medications and the Soliqua, glucose was trending in the mid to upper 100's mg/dl. However, her glucose has been running in the 300-400's mg/dl since she was discharged on 06/09/18 and started taking the Prednisone. Patient reports that she only took the Prednisone for 3 days.  Discussed A1C results (8.5% on 06/07/18) and explained that A1C indicates an average glucose of 197 mg/dl over the past 2-3 months. Discussed glucose and A1C goals. Discussed importance of checking CBGs and maintaining good CBG control to prevent long-term and short-term complications.  Stressed  to the patient the importance of improving glycemic control to prevent further complications from uncontrolled diabetes.  Explained to the patient, that she may need to take basal insulin to improve DM control especially since she could not tolerate the [place].  Encouraged patient to inform Dr. Hartford Poli that she had to stop taking the [facility] and make sure to let Dr. Hartford Poli knows she recently was ordered steroids.  Encouraged  patient to check glucose 3-4 times per day and to keep a record of glucose readings to take to the doctor when followed up. Patient verbalized understanding of information discussed and she states that she has no further questions at this time related to diabetes.  Thanks, Barnie Alderman, RN, MSN, CDE Diabetes Coordinator Inpatient Diabetes Program [phone] (Team Pager from 8am to 5pm)

## 2018-06-17 NOTE — Progress Notes (Signed)
PHARMACY - PHYSICIAN COMMUNICATION CRITICAL VALUE ALERT - BLOOD CULTURE IDENTIFICATION (BCID)  Dannika Hilgeman is an 68 y.o. female who presented to [facility] on 06/16/2018 with a chief complaint of discitis/osteomyelitis of L1-2.   Assessment:  68 year old female with progressive back pain X 4 weeks. Found to have discitis and osteomyelitis of lumbar spine. Now with 3/4 blood cultures positive for MSSA. Noted that patient also has an ulcer on her right foot.   Name of physician (or Provider) Contacted: [place] w/ID - Automatic consult since Staph aureus bacteremia  Current antibiotics: None  Changes to prescribed antibiotics recommended:  Add cefazolin per pharmacy protocol- Discussed with ID   Results for orders placed or performed during the hospital encounter of 06/16/18  Blood Culture ID Panel (Reflexed) (Collected: 06/16/2018  2:51 PM)  Result Value Ref Range   Enterococcus species NOT DETECTED NOT DETECTED   Listeria monocytogenes NOT DETECTED NOT DETECTED   Staphylococcus species DETECTED (A) NOT DETECTED   Staphylococcus aureus (BCID) DETECTED (A) NOT DETECTED   Methicillin resistance NOT DETECTED NOT DETECTED   Streptococcus species NOT DETECTED NOT DETECTED   Streptococcus agalactiae NOT DETECTED NOT DETECTED   Streptococcus pneumoniae NOT DETECTED NOT DETECTED   Streptococcus pyogenes NOT DETECTED NOT DETECTED   Acinetobacter baumannii NOT DETECTED NOT DETECTED   Enterobacteriaceae species NOT DETECTED NOT DETECTED   Enterobacter cloacae complex NOT DETECTED NOT DETECTED   Escherichia coli NOT DETECTED NOT DETECTED   Klebsiella oxytoca NOT DETECTED NOT DETECTED   Klebsiella pneumoniae NOT DETECTED NOT DETECTED   Proteus species NOT DETECTED NOT DETECTED   Serratia marcescens NOT DETECTED NOT DETECTED   Haemophilus influenzae NOT DETECTED NOT DETECTED   Neisseria meningitidis NOT DETECTED NOT DETECTED   Pseudomonas aeruginosa NOT DETECTED NOT DETECTED   Candida albicans NOT  DETECTED NOT DETECTED   Candida glabrata NOT DETECTED NOT DETECTED   Candida krusei NOT DETECTED NOT DETECTED   Candida parapsilosis NOT DETECTED NOT DETECTED   Candida tropicalis NOT DETECTED NOT DETECTED    Jimmy Footman, PharmD, BCPS, BCIDP Infectious Diseases Clinical Pharmacist Phone: [phone] 06/17/2018  11:32 AM

## 2018-06-17 NOTE — Progress Notes (Signed)
Patient ID: Kaitlin Vargas, female   DOB: October 19, 1950, 68 y.o.   MRN: [id number]   Pt is scheduled for L1-2 disc aspiration/abscess aspiration at [facility] 1/29  To be at [facility] am via ambulance 1/29 Will return to [facility] after procedure  NPO orders in place HOLD Lovenox orders in place  RN aware

## 2018-06-18 ENCOUNTER — Inpatient Hospital Stay (HOSPITAL_COMMUNITY): Payer: Medicare HMO

## 2018-06-18 ENCOUNTER — Encounter (HOSPITAL_COMMUNITY): Payer: Self-pay

## 2018-06-18 ENCOUNTER — Ambulatory Visit (HOSPITAL_COMMUNITY)
Admission: RE | Admit: 2018-06-18 | Discharge: 2018-06-18 | Disposition: A | Discharge status: Home / Self Care | Payer: Medicare HMO | Source: Ambulatory Visit | Attending: Internal Medicine | Admitting: Internal Medicine

## 2018-06-18 DIAGNOSIS — I10 Essential (primary) hypertension: Secondary | ICD-10-CM

## 2018-06-18 DIAGNOSIS — M4640 Discitis, unspecified, site unspecified: Secondary | ICD-10-CM

## 2018-06-18 DIAGNOSIS — I469 Cardiac arrest, cause unspecified: Secondary | ICD-10-CM

## 2018-06-18 DIAGNOSIS — G9340 Encephalopathy, unspecified: Secondary | ICD-10-CM

## 2018-06-18 DIAGNOSIS — E118 Type 2 diabetes mellitus with unspecified complications: Secondary | ICD-10-CM

## 2018-06-18 DIAGNOSIS — M4646 Discitis, unspecified, lumbar region: Secondary | ICD-10-CM

## 2018-06-18 DIAGNOSIS — G061 Intraspinal abscess and granuloma: Secondary | ICD-10-CM

## 2018-06-18 DIAGNOSIS — E785 Hyperlipidemia, unspecified: Secondary | ICD-10-CM

## 2018-06-18 DIAGNOSIS — L97519 Non-pressure chronic ulcer of other part of right foot with unspecified severity: Secondary | ICD-10-CM

## 2018-06-18 DIAGNOSIS — E11621 Type 2 diabetes mellitus with foot ulcer: Secondary | ICD-10-CM

## 2018-06-18 DIAGNOSIS — D649 Anemia, unspecified: Secondary | ICD-10-CM

## 2018-06-18 DIAGNOSIS — M4626 Osteomyelitis of vertebra, lumbar region: Principal | ICD-10-CM

## 2018-06-18 DIAGNOSIS — Z8674 Personal history of sudden cardiac arrest: Secondary | ICD-10-CM

## 2018-06-18 DIAGNOSIS — Z881 Allergy status to other antibiotic agents status: Secondary | ICD-10-CM

## 2018-06-18 DIAGNOSIS — B9562 Methicillin resistant Staphylococcus aureus infection as the cause of diseases classified elsewhere: Secondary | ICD-10-CM

## 2018-06-18 DIAGNOSIS — R7881 Bacteremia: Secondary | ICD-10-CM

## 2018-06-18 DIAGNOSIS — E1110 Type 2 diabetes mellitus with ketoacidosis without coma: Secondary | ICD-10-CM

## 2018-06-18 HISTORY — PX: IR LUMBAR DISC ASPIRATION W/IMG GUIDE: IMG5306

## 2018-06-18 LAB — BASIC METABOLIC PANEL
ANION GAP: 8 (ref 5–15)
BUN: 18 mg/dL (ref 8–23)
CO2: 23 mmol/L (ref 22–32)
Calcium: 8.2 mg/dL — ABNORMAL LOW (ref 8.9–10.3)
Chloride: 101 mmol/L (ref 98–111)
Creatinine, Ser: 0.57 mg/dL (ref 0.44–1.00)
GFR calc Af Amer: >60 mL/min (ref >60)
GFR calc non Af Amer: >60 mL/min (ref >60)
Glucose, Bld: 202 mg/dL — ABNORMAL HIGH (ref 70–99)
POTASSIUM: 4.5 mmol/L (ref 3.5–5.1)
Sodium: 132 mmol/L — ABNORMAL LOW (ref 135–145)

## 2018-06-18 LAB — CBC
HCT: 31.5 % — ABNORMAL LOW (ref 36.0–46.0)
Hemoglobin: 9.9 g/dL — ABNORMAL LOW (ref 12.0–15.0)
MCH: 32 pg (ref 26.0–34.0)
MCHC: 31.4 g/dL (ref 30.0–36.0)
MCV: 101.9 fL — ABNORMAL HIGH (ref 80.0–100.0)
Platelets: 429 10*3/uL — ABNORMAL HIGH (ref 150–400)
RBC: 3.09 MIL/uL — AB (ref 3.87–5.11)
RDW: 19.8 % — ABNORMAL HIGH (ref 11.5–15.5)
WBC: 12.6 10*3/uL — ABNORMAL HIGH (ref 4.0–10.5)
nRBC: 0 % (ref 0.0–0.2)

## 2018-06-18 LAB — ECHOCARDIOGRAM COMPLETE
Height: 58 in
Weight: 2578.5 oz

## 2018-06-18 LAB — GLUCOSE, CAPILLARY
Glucose-Capillary: 224 mg/dL — ABNORMAL HIGH (ref 70–99)
Glucose-Capillary: 143 mg/dL — ABNORMAL HIGH (ref 70–99)
Glucose-Capillary: 154 mg/dL — ABNORMAL HIGH (ref 70–99)

## 2018-06-18 LAB — MAGNESIUM: Magnesium: 1.4 mg/dL — ABNORMAL LOW (ref 1.7–2.4)

## 2018-06-18 LAB — HIV ANTIBODY (ROUTINE TESTING W REFLEX): HIV Screen 4th Generation wRfx: NONREACTIVE

## 2018-06-18 MED ORDER — FENTANYL CITRATE (PF) 100 MCG/2ML IJ SOLN
INTRAMUSCULAR | Status: AC
  Filled 2018-06-18: qty 4

## 2018-06-18 MED ORDER — NALOXONE HCL 0.4 MG/ML IJ SOLN
INTRAMUSCULAR | Status: AC
  Filled 2018-06-18: qty 1

## 2018-06-18 MED ORDER — NALOXONE HCL 0.4 MG/ML IJ SOLN
INTRAMUSCULAR | Status: AC | PRN
  Administered 2018-06-18: 0.4 mg via INTRAVENOUS

## 2018-06-18 MED ORDER — FLUMAZENIL 0.5 MG/5ML IV SOLN
INTRAVENOUS | Status: AC
  Filled 2018-06-18: qty 5

## 2018-06-18 MED ORDER — FENTANYL CITRATE (PF) 100 MCG/2ML IJ SOLN
INTRAMUSCULAR | Status: AC | PRN
  Administered 2018-06-18: 50 ug via INTRAVENOUS

## 2018-06-18 MED ORDER — PERFLUTREN LIPID MICROSPHERE
1.0000 mL | INTRAVENOUS | Status: AC | PRN
  Administered 2018-06-18: 3 mL via INTRAVENOUS
  Filled 2018-06-18: qty 10

## 2018-06-18 MED ORDER — MIDAZOLAM HCL 2 MG/2ML IJ SOLN
INTRAMUSCULAR | Status: AC | PRN
  Administered 2018-06-18: 1 mg via INTRAVENOUS

## 2018-06-18 MED ORDER — LIDOCAINE HCL 1 % IJ SOLN
INTRAMUSCULAR | Status: AC
  Filled 2018-06-18: qty 20

## 2018-06-18 MED ORDER — METOPROLOL TARTRATE 25 MG PO TABS
25.0000 mg | ORAL_TABLET | Freq: Two times a day (BID) | ORAL | Status: DC
  Administered 2018-06-18 – 2018-06-26 (×17): 25 mg via ORAL
  Filled 2018-06-18 (×17): qty 1

## 2018-06-18 MED ORDER — MAGNESIUM SULFATE 4 GM/100ML IV SOLN
4.0000 g | Freq: Once | INTRAVENOUS | Status: AC
  Administered 2018-06-18: 4 g via INTRAVENOUS
  Filled 2018-06-18: qty 100

## 2018-06-18 MED ORDER — LIDOCAINE HCL (PF) 1 % IJ SOLN
INTRAMUSCULAR | Status: AC | PRN
  Administered 2018-06-18: 10 mL

## 2018-06-18 MED ORDER — MIDAZOLAM HCL 2 MG/2ML IJ SOLN
INTRAMUSCULAR | Status: AC
  Filled 2018-06-18: qty 4

## 2018-06-18 NOTE — Sedation Documentation (Signed)
Patient was assist bag ventilations sinus brady on monitor.

## 2018-06-18 NOTE — Sedation Documentation (Signed)
Patient arrived to ICU with rapid response and IR nurse.

## 2018-06-18 NOTE — Sedation Documentation (Signed)
Patient assisting moving her left arm from her side to above head.

## 2018-06-18 NOTE — Progress Notes (Signed)
Kaitlin Vargas is a 68 yo female with history of T2DM and HTN who was recently admitted to the ICU at [facility] with DKA, lower back pain, and diabetic foot ulcer. MRI of lumbar spine showed L1-2 discitis/osteomyelitis with extensive phlegmon and abscess in the R-sided paravertebral soft tissues. She was transferred to [facility] today for disc aspiration by IR. She received 1 of versed and 50 mcg of fentanyl. After the procedure, patient became apneic and bradycardic, and eventually lost her pulse. Unclear if this was asystole or PEA. She received CPR for 1 minute and 30 seconds as well as 0.4mg  of Narcan x1. No shocks or ACLS medications given. By the time I arrived patient had a pulse and was in NSR. She was normotensive, had normal WOB, and was oxygenating well on 2L of O2. She was somnolent but arousable to voice, answering questions appropriately, and following commands.    Further workup at [facility]:   Bcx grew MSSA in 3/4 bottles and she was started on Ancef. Echocardiogram ordered to assess for IE but not done yet.   ABI performed, 1.0 bilaterally. MRI of R foot ordered to r/o osteo of R foot.   DKA had apparently resolved and it was thought to be 2/2 recent steroid use for URI?   There was concern for possible UTI due to dirty urine, but she is asymptomatic and already on antibiotics.

## 2018-06-18 NOTE — Sedation Documentation (Signed)
Patient becoming apneic and bradycardic doctor nurse and IT tech's in room called charge nurse and staff for assistance.

## 2018-06-18 NOTE — Progress Notes (Signed)
PROGRESS NOTE    Kaitlin Vargas  YQI:[id number] DOB: 07-May-1951 DOA: 06/16/2018 PCP: [name], MD    Brief Narrative:  68 year old female history of hypertension, diabetes, presents to the hospital with complaints of back pain.  She was having radicular symptoms down both legs.  She underwent MRI of the lumbar spine that indicated discitis with phlegmon/possible abscess.  She was admitted to the hospital for IV antibiotics.  Blood cultures have been positive for MSSA.  She is undergoing further work-up with echocardiogram.  She also has an infection/cellulitis in her right foot. MRI of the right foot does not indicate clear osteomyelitis.  Patient will transfer to [facility] to undergo aspiration/drainage of possible abscess in lumbar space.   Assessment & Plan:   Principal Problem:   Diskitis Active Problems:   Essential hypertension   Type 2 diabetes mellitus with hyperlipidemia (HCC)   DKA (diabetic ketoacidosis) ([place])   Diabetic foot ulcer ([place])   MSSA bacteremia   1. MSSA bacteremia.  Currently on cefazolin.  Complicating features include discitis.  It is possible that original source is likely cellulitis on her right foot.  Echocardiogram has been ordered.  He will likely need 8 weeks of antibiotics.  She will need repeat cultures to show clearing of bacteremia.. 2. Discitis.  Patient complains of significant back pain.  She is still able to ambulate and does not have any new neurologic deficits.  MRI of lumbar spine indicates a phlegmon/possible abscess.  She will transport to [facility] today to undergo aspiration/drainage by interventional radiology. 3. Diabetic foot ulcer.  ABI performed her 1.0 bilaterally.  She is currently on IV antibiotics.  MRI of the right foot does not show any evidence of underlying osteomyelitis 4. Diabetic ketoacidosis.  Improved with intravenous insulin and IV fluids.  Patient reports that her blood sugars got substantially worse  after being started on prednisone for upper respiratory tract illness last week.  She has been transitioned to Lantus insulin and blood sugars have been stable..  Continue to monitor blood sugars. 5. Hypertension.  Continue on lisinopril and metoprolol. 6. Possible urinary tract infection.  Urinalysis positive for nitrates.  Urine culture positive for staph aureus.  She is on intravenous antibiotics   DVT prophylaxis: Lovenox Code Status: Full code Family Communication: No family present this morning Disposition Plan: Patient will go to [facility] today for interventional radiology aspirate/drainage of L1-L2 disc space/surrounding soft tissues.   Consultants:   Interventional radiology  Procedures:     Antimicrobials:   Ancef 1/28 >   Subjective: Continues to complain of back pain.  No shortness of breath or chest pain.  Objective: Vitals:   06/18/18 0600 06/18/18 1219 06/18/18 1230 06/18/18 1235  BP: (!) 159/78 107/83    Pulse: 96 100 (!) 102   Resp: 10 (!) 23 (!) 21   Temp:    98.6 F (37 C)  TempSrc:    Oral  SpO2: 95% 97% 96%   Weight: 74.4 kg  73.1 kg   Height:   4\' 10"  (1.473 m)     Intake/Output Summary (Last 24 hours) at 06/18/2018 1235 Last data filed at 06/18/2018 3875 Gross per 24 hour  Intake 693.6 ml  Output -  Net 693.6 ml   Filed Weights   06/17/18 0600 06/18/18 0600 06/18/18 1230  Weight: 73.2 kg 74.4 kg 73.1 kg    Examination:  General exam: Alert, awake, oriented x 3 Respiratory system: Clear to auscultation. Respiratory effort  normal. Cardiovascular system:RRR. No murmurs, rubs, gallops. Gastrointestinal system: Abdomen is nondistended, soft and nontender. No organomegaly or masses felt. Normal bowel sounds heard. Central nervous system: Alert and oriented. No focal neurological deficits. Extremities: Swelling is present in right foot. Skin: Plantar aspect of right foot with large callus and surrounding swelling.  Overall erythema  has improved since admission. Psychiatry: Judgement and insight appear normal. Mood & affect appropriate.    Data Reviewed: I have personally reviewed following labs and imaging studies  CBC: Recent Labs  Lab 06/16/18 1244 06/18/18 0500  WBC 22.6* 12.6*  NEUTROABS 20.4*  --   HGB 11.6* 9.9*  HCT 38.3 31.5*  MCV 107.3* 101.9*  PLT 424* 440*   Basic Metabolic Panel: Recent Labs  Lab 06/17/18 0216 06/17/18 0549 06/17/18 0853 06/17/18 1413 06/18/18 0500  NA 131* 135 134* 134* 132*  K 5.4* 4.2 3.5 3.2* 4.5  CL 104 103 101 102 101  CO2 20* 27 23 25 23   GLUCOSE 131* 177* 247* 93 202*  BUN 24* 23 23 20 18   CREATININE 0.63 0.60 0.61 0.61 0.57  CALCIUM 7.9* 8.4* 8.5* 8.3* 8.2*  MG  --   --   --   --  1.4*   GFR: Estimated Creatinine Clearance: 56.4 mL/min (by C-G formula based on SCr of 0.57 mg/dL). Liver Function Tests: Recent Labs  Lab 06/16/18 1451  AST 12*  ALT 10  ALKPHOS 103  BILITOT 2.8*  PROT 7.1  ALBUMIN 2.5*   Recent Labs  Lab 06/16/18 1451  LIPASE 17   No results for input(s): AMMONIA in the last 168 hours. Coagulation Profile: Recent Labs  Lab 06/17/18 0853  INR 1.04   Cardiac Enzymes: No results for input(s): CKTOTAL, CKMB, CKMBINDEX, TROPONINI in the last 168 hours. BNP (last 3 results) No results for input(s): PROBNP in the last 8760 hours. HbA1C: No results for input(s): HGBA1C in the last 72 hours. CBG: Recent Labs  Lab 06/17/18 1004 06/17/18 1130 06/17/18 1639 06/17/18 2113 06/18/18 1231  GLUCAP 222* 191* 93 167* 224*   Lipid Profile: No results for input(s): CHOL, HDL, LDLCALC, TRIG, CHOLHDL, LDLDIRECT in the last 72 hours. Thyroid Function Tests: No results for input(s): TSH, T4TOTAL, FREET4, T3FREE, THYROIDAB in the last 72 hours. Anemia Panel: No results for input(s): VITAMINB12, FOLATE, FERRITIN, TIBC, IRON, RETICCTPCT in the last 72 hours. Sepsis Labs: Recent Labs  Lab 06/16/18 1451 06/16/18 1750  LATICACIDVEN 1.5  1.1    Recent Results (from the past 240 hour(s))  Urine culture     Status: Abnormal (Preliminary result)   Collection Time: 06/16/18 12:57 PM  Result Value Ref Range Status   Specimen Description   Final    URINE, CLEAN CATCH Performed at [facility], [address] [place], [place] [address]    Special Requests   Final    NONE Performed at [facility], [address], [place], [place] [address]    Culture (A)  Final    >=100,000 COLONIES/mL STAPHYLOCOCCUS AUREUS SUSCEPTIBILITIES TO FOLLOW Performed at [facility] [address] [place], [place] [address]    Report Status PENDING  Incomplete  Culture, blood (routine x 2)     Status: Abnormal (Preliminary result)   Collection Time: 06/16/18  1:53 PM  Result Value Ref Range Status   Specimen Description   Final    LEFT ANTECUBITAL Performed at [facility], [address] [place], [place] [address]    Special Requests   Final  BOTTLES DRAWN AEROBIC ONLY Blood Culture results may not be optimal due to an inadequate volume of blood received in culture bottles Performed at [facility], [address], [place], [place] [address]    Culture  Setup Time   Final    AEROBIC BOTTLE ONLY GRAM POSITIVE COCCI Gram Stain Report Called to,Read Back By and Verified With: J HEARN,RN @0338  06/17/18 [facility] Performed at [facility], [address] [place], [place] [address]    Culture STAPHYLOCOCCUS AUREUS (A)  Final   Report Status PENDING  Incomplete  Culture, blood (routine x 2)     Status: Abnormal (Preliminary result)   Collection Time: 06/16/18  2:51 PM  Result Value Ref Range Status   Specimen Description   Final    BLOOD BLOOD RIGHT WRIST Performed at [facility], [address], [place], [place] [address]    Special Requests   Final    BOTTLES DRAWN AEROBIC AND ANAEROBIC Blood Culture results may not be optimal due to an inadequate volume of blood received in culture bottles Performed at [facility] [place], [place] [address]    Culture  Setup Time   Final    IN BOTH AEROBIC AND ANAEROBIC BOTTLES GRAM POSITIVE COCCI Gram Stain Report Called to,Read Back By and Verified With: J HEARN,RN @0338  06/17/18 [facility] CRITICAL RESULT CALLED TO, READ BACK BY AND VERIFIED WITH: PHARMD L POOLE [id number] MLM    Culture (A)  Final    STAPHYLOCOCCUS AUREUS SUSCEPTIBILITIES TO FOLLOW Performed at [facility], [place] [address], [place], [place] [address]    Report Status PENDING  Incomplete  Blood Culture ID Panel (Reflexed)     Status: Abnormal   Collection Time: 06/16/18  2:51 PM  Result Value Ref Range Status   Enterococcus species NOT DETECTED NOT DETECTED Final   Listeria monocytogenes NOT DETECTED NOT DETECTED Final   Staphylococcus species DETECTED (A) NOT DETECTED Final    Comment: CRITICAL RESULT CALLED TO, READ BACK BY AND VERIFIED WITH: PHARMD L POOLE [id number] [phone] MLM    Staphylococcus aureus (BCID) DETECTED (A) NOT DETECTED Final    Comment: Methicillin (oxacillin) susceptible Staphylococcus aureus (MSSA). Preferred therapy is anti staphylococcal beta lactam antibiotic (Cefazolin or Nafcillin), unless clinically contraindicated. CRITICAL RESULT CALLED TO, READ BACK BY AND VERIFIED WITH: PHARMD L POOLE [id number] [phone] MLM    Methicillin resistance NOT DETECTED NOT DETECTED Final   Streptococcus species NOT DETECTED NOT DETECTED Final   Streptococcus agalactiae NOT DETECTED NOT DETECTED Final   Streptococcus pneumoniae NOT DETECTED NOT DETECTED Final   Streptococcus pyogenes NOT DETECTED NOT DETECTED Final   Acinetobacter baumannii NOT DETECTED NOT DETECTED Final   Enterobacteriaceae species NOT DETECTED NOT DETECTED Final   Enterobacter cloacae complex NOT DETECTED NOT DETECTED Final   Escherichia coli NOT DETECTED NOT DETECTED Final   Klebsiella oxytoca NOT DETECTED NOT DETECTED Final   Klebsiella pneumoniae NOT DETECTED NOT DETECTED Final   Proteus species NOT DETECTED NOT  DETECTED Final   Serratia marcescens NOT DETECTED NOT DETECTED Final   Haemophilus influenzae NOT DETECTED NOT DETECTED Final   Neisseria meningitidis NOT DETECTED NOT DETECTED Final   Pseudomonas aeruginosa NOT DETECTED NOT DETECTED Final   Candida albicans NOT DETECTED NOT DETECTED Final   Candida glabrata NOT DETECTED NOT DETECTED Final   Candida krusei NOT DETECTED NOT DETECTED Final   Candida parapsilosis NOT DETECTED NOT DETECTED Final   Candida tropicalis NOT DETECTED NOT DETECTED Final  Comment: Performed at [facility], [place] [address] [place], [place] [address]         Radiology Studies: Mr Lumbar Spine W Wo Contrast  Result Date: 06/16/2018 CLINICAL DATA:  Low back pain. Findings concerning for L1-2 discitis-osteomyelitis on abdominal CT. EXAM: MRI LUMBAR SPINE WITHOUT AND WITH CONTRAST TECHNIQUE: Multiplanar and multiecho pulse sequences of the lumbar spine were obtained without and with intravenous contrast. CONTRAST:  7 mL Gadavist COMPARISON:  CT abdomen and pelvis 06/16/2018 FINDINGS: Segmentation:  Standard. Alignment:  Normal. Vertebrae: There are erosive changes involving the L2 superior greater than L1 inferior endplates with extensive marrow edema and enhancement in the L2 greater than L1 vertebral bodies. There is asymmetric moderate disc space loss at L1-2. There is at most minimal ventral epidural phlegmon at L1-2 without evidence of epidural abscess. Bone marrow signal elsewhere is mildly heterogeneous without a suspicious focal lesion identified. No fracture is evident. Conus medullaris and cauda equina: Conus extends to the upper L2 level. Conus and cauda equina appear normal. Paraspinal and other soft tissues: Extensive right-sided and milder left-sided paravertebral soft tissue inflammation centered at L1-2. Partially visualized rim enhancing fluid collections on the right extending from L1-2 superiorly into the region of the crus of the right hemidiaphragm  with the larger collection measuring approximately 5 cm in length. Disc levels: T12-L1: Negative. L1-2: Discitis-osteomyelitis as described above. Mild left neural foraminal narrowing. No spinal stenosis. L2-3: Mild left facet hypertrophy without disc herniation or stenosis. L3-4: Minimal disc bulging and mild facet hypertrophy without stenosis. L4-5: Disc bulging greater to the right and moderate facet hypertrophy result in mild right neural foraminal stenosis without spinal stenosis. Left larger than right facet joint effusions. L5-S1: Mild facet arthrosis without stenosis. 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 Electronically Signed   By: Logan Bores M.D.   On: 06/16/2018 17:00   Mr Foot Right Wo Contrast  Result Date: 06/18/2018 CLINICAL DATA:  Right forefoot pain and swelling in a diabetic patient for 6 weeks. No known injury. EXAM: MRI OF THE RIGHT FOREFOOT WITHOUT CONTRAST TECHNIQUE: Multiplanar, multisequence MR imaging of the right forefoot was performed. No intravenous contrast was administered. COMPARISON:  Plain films right foot 06/16/2018 FINDINGS: Bones/Joint/Cartilage No acute bony or joint abnormality is identified. There is no bone marrow signal change to suggest osteomyelitis. Severe degenerative disease about the tarsometatarsal joints is worst at the first TMT joint. Ligaments Intact. Muscles and Tendons Intact. No intramuscular fluid collection. Fatty atrophy of intrinsic musculature the foot is noted. Soft tissues Small volume of fluid subjacent to the first tarsometatarsal joint measures 1 cm transverse by 1.2 cm craniocaudal by 1.1 cm long and likely represents an adventitial bursa. IMPRESSION: Negative for acute bony or joint abnormality. Advanced osteoarthritis about the tarsometatarsal joints appears worst at the first TMT joint. Small volume of fluid in the subcutaneous tissues deep to the first TMT  joint likely represents adventitial bursa formation. Abscess is possible but thought less likely. Electronically Signed   By: Inge Rise M.D.   On: 06/18/2018 10:24   US Arterial Abi (screening Lower Extremity)  Result Date: 06/17/2018 CLINICAL DATA:  Diabetes, right medial diabetic wound ulcer x4 days. Hypertension, hyperlipidemia. EXAM: NONINVASIVE PHYSIOLOGIC VASCULAR STUDY OF BILATERAL LOWER EXTREMITIES TECHNIQUE: Evaluation of both lower extremities were performed at rest, including calculation of ankle-brachial indices with single level Doppler, pressure and pulse volume recording. COMPARISON:  None. FINDINGS: Right ABI:  1.06  Left ABI:  1.0 Right Lower Extremity:  Normal arterial waveforms at the ankle. Left Lower Extremity:  Normal arterial waveforms at the ankle. IMPRESSION: No evidence of hemodynamically significant lower extremity arterial occlusive disease at rest. Electronically Signed   By: Lucrezia Europe M.D.   On: 06/17/2018 10:16   Dg Foot Complete Right  Result Date: 06/16/2018 CLINICAL DATA:  68 year old female with foot ulcer EXAM: RIGHT FOOT COMPLETE - 3+ VIEW COMPARISON:  None. FINDINGS: Circumferential soft tissue swelling of the forefoot. No radiopaque foreign body. No lytic changes visualized. Degenerative changes of the interphalangeal joints. Neuropathic midfoot changes including sclerotic changes, disruption of the Lisfranc joint/disorganization, loss of normal joint spaces of the cuneiform is a and cuboid as well as the cuboid navicular joint. Degenerative changes of the hindfoot. IMPRESSION: Circumferential soft tissue swelling of the forefoot, should be amenable to direct inspection. Neuropathic changes of the midfoot, including disruption of the Lisfranc joint. Electronically Signed   By: Corrie Mckusick D.O.   On: 06/16/2018 19:44   Ct Renal Stone Study  Result Date: 06/16/2018 CLINICAL DATA:  Low back pain radiating into the stomach.  Nausea. EXAM: CT ABDOMEN AND PELVIS  WITHOUT CONTRAST TECHNIQUE: Multidetector CT imaging of the abdomen and pelvis was performed following the standard protocol without IV contrast. COMPARISON:  None. FINDINGS: Lower chest: Heart size is mildly enlarged. Calcific coronary artery calcifications are identified. Mild dependent atelectasis noted. No pleural or pericardial effusion. Hepatobiliary: Multiple small stones are seen layering dependently within the gallbladder. There is no inflammatory change about the gallbladder. The liver and biliary tree appear normal. Pancreas: Unremarkable. No pancreatic ductal dilatation or surrounding inflammatory changes. The pancreas is atrophic. Spleen: Normal in size without focal abnormality. Adrenals/Urinary Tract: Small, benign left adrenal adenoma is noted. Right adrenal gland appears normal. The kidneys, ureters and urinary bladder are unremarkable. No hydronephrosis or stones. Stomach/Bowel: Stomach is within normal limits. Appendix appears normal. No evidence of bowel wall thickening, distention, or inflammatory changes. Vascular/Lymphatic: Aortic atherosclerosis. No enlarged abdominal or pelvic lymph nodes. Reproductive: Status post hysterectomy. No adnexal masses. Other: Trace amount of presacral fluid is noted. Musculoskeletal: There is endplate destructive change at L1-2, much worse in the superior endplate of L2. Surrounding paraspinous inflammatory change is worse on the right. Imaged bones otherwise appear normal. IMPRESSION: Findings highly suspicious for discitis and osteomyelitis at L1-2 with associated paraspinous inflammatory change. MRI of the lumbar spine with and without contrast is recommended for further evaluation. Cardiomegaly. Gallstones without evidence of cholecystitis. Calcific aortic and coronary atherosclerosis. These results were called by telephone at the time of interpretation on 06/16/2018 at 3:39 pm to Dr. Nanda Quinton , who verbally acknowledged these results. Electronically Signed    By: Inge Rise M.D.   On: 06/16/2018 15:40        Scheduled Meds: . aspirin  81 mg Oral Daily  . feeding supplement (GLUCERNA SHAKE)  237 mL Oral BID BM  . feeding supplement (PRO-STAT SUGAR FREE 64)  30 mL Oral BID  . insulin aspart  0-15 Units Subcutaneous TID WC  . insulin aspart  0-5 Units Subcutaneous QHS  . insulin glargine  15 Units Subcutaneous Daily  . lisinopril  10 mg Oral Daily  . metoprolol tartrate  25 mg Oral BID  . pantoprazole  40 mg Oral Daily  . vitamin B-12  1,000 mcg Oral Daily   Continuous Infusions: .  ceFAZolin (ANCEF) IV 2 g (06/18/18 0611)     LOS: 2 days  Time spent: 4mins    Kathie Dike, MD [facility]   If 7PM-7AM, please contact night-coverage [email or web address]  06/18/2018, 12:35 PM

## 2018-06-18 NOTE — Consult Note (Addendum)
Chief Complaint: Patient was seen in consultation today for image guided aspiration/possible drainage of L1-2 disc space/surrounding soft tissue  Chief Complaint  Patient presents with  . Back Pain    Referring Physician(s): Thomasenia Bottoms  Supervising Physician: Arne Cleveland  Patient Status: AP IP  History of Present Illness: Kaitlin Vargas is a 68 y.o. female with hx HTN,DM who was admitted to [facility] on 06/16/18 with persistent mid to low back pain with radicular symptoms. Subsequent MRI L spine revealed L1-2 discitis/osteomyelitis with extensive phlegmon/?abscess in rt sided paravertebral soft tissues. Blood and urine cx have grown staph. She is afebrile. WBC 12.6. Request now received for image guided aspiration/possible drainage of the L1-2 disc space/surrounding soft tissues.   Past Medical History:  Diagnosis Date  . Anemia   . Diabetes mellitus without complication ([place])   . Hyperlipidemia   . Hypertension   . Peripheral neuropathy     Past Surgical History:  Procedure Laterality Date  . ABDOMINAL HYSTERECTOMY      Allergies: Sulfa antibiotics  Medications: Prior to Admission medications   Medication Sig Start Date End Date Taking? Authorizing Provider  albuterol (PROVENTIL HFA;VENTOLIN HFA) 108 (90 Base) MCG/ACT inhaler Inhale 2 puffs into the lungs every 6 (six) hours as needed for wheezing or shortness of breath. 06/09/18  Yes Manuella Ghazi, Pratik D, DO  aspirin 81 MG chewable tablet Chew 81 mg by mouth every morning.    Yes [provider]  dextromethorphan-guaiFENesin (MUCINEX DM) 30-600 MG 12hr tablet Take 1 tablet by mouth 2 (two) times daily for 10 days. 06/09/18 06/19/18 Yes Shah, Pratik D, DO  glipiZIDE (GLUCOTROL XL) 10 MG 24 hr tablet Take 10 mg by mouth 2 (two) times daily.  04/28/18  Yes [provider]  lisinopril (PRINIVIL,ZESTRIL) 20 MG tablet Take 10 mg by mouth daily.  02/06/18  Yes [provider]  metFORMIN (GLUCOPHAGE) 1000 MG tablet  Take 1,000 mg by mouth 2 (two) times daily with a meal.  07/02/17 07/02/18 Yes [provider]  metoprolol tartrate (LOPRESSOR) 25 MG tablet Take 25 mg by mouth daily.  01/31/18  Yes [provider]  Multiple Vitamin (THERA) TABS Take 1 tablet by mouth daily.    Yes [provider]  pantoprazole (PROTONIX) 40 MG tablet Take 1 tablet (40 mg total) by mouth daily. 06/09/18 06/09/19 Yes Shah, Pratik D, DO  pioglitazone (ACTOS) 30 MG tablet Take 30 mg by mouth daily.  04/28/18  Yes [provider]  vitamin B-12 (CYANOCOBALAMIN) 1000 MCG tablet Take 1 tablet (1,000 mcg total) by mouth daily for 30 days. 06/09/18 07/09/18 Yes Manuella Ghazi, Pratik D, DO     Family History  Problem Relation Age of Onset  . Cancer Mother   . Stroke Mother   . Cancer Father   . Leukemia Father     Social History   Socioeconomic History  . Marital status: Married    Spouse name: Not on file  . Number of children: 2  . Years of education: Not on file  . Highest education level: Not on file  Occupational History  . Occupation: Retired Surveyor, minerals  Social Needs  . Financial resource strain: Somewhat hard  . Food insecurity:    Worry: Never true    Inability: Never true  . Transportation needs:    Medical: Patient refused    Non-medical: Patient refused  Tobacco Use  . Smoking status: Never Smoker  . Smokeless tobacco: Never Used  Substance and Sexual Activity  .  Alcohol use: Never    Frequency: Never  . Drug use: Never  . Sexual activity: Not Currently  Lifestyle  . Physical activity:    Days per week: Patient refused    Minutes per session: Patient refused  . Stress: To some extent  Relationships  . Social connections:    Talks on phone: Patient refused    Gets together: Patient refused    Attends religious service: Patient refused    Active member of club or organization: Patient refused    Attends meetings of clubs or organizations: Patient refused    Relationship  status: Patient refused  Other Topics Concern  . Not on file  Social History Narrative  . Not on file      Review of Systems denies fever,HA,CP,dyspnea, cough, or bleeding; she does have some abd pain, back pain, occ nausea  Vital Signs: BP (!) 159/78   Pulse 96   Temp 99 F (37.2 C)   Resp 10   Ht 4\' 11"  (1.499 m)   Wt 164 lb 0.4 oz (74.4 kg)   SpO2 95%   BMI 33.13 kg/m   Physical Exam awake/alert; chest- CTA bilat ant; heart- RRR; abd- soft,+BS, some mild diffuse tenderness to palpation, ext with tr edema, occ paresthesias RLE, tender callus plantar aspect rt foot  Imaging: Dg Chest 2 View  Result Date: 06/07/2018 CLINICAL DATA:  Cough and shortness of breath EXAM: CHEST - 2 VIEW COMPARISON:  None. FINDINGS: There is atelectatic change in the right mid lung and left base regions. There is no edema or consolidation. Heart is borderline enlarged with pulmonary vascularity normal. No adenopathy. There is calcification in the left anterior descending coronary artery. There is degenerative change in the thoracic spine. IMPRESSION: Areas of atelectatic change. No edema or consolidation. Heart borderline enlarged with foci coronary artery calcification. No adenopathy. Electronically Signed   By: Lowella Grip III M.D.   On: 06/07/2018 11:49   Mr Lumbar Spine W Wo Contrast  Result Date: 06/16/2018 CLINICAL DATA:  Low back pain. Findings concerning for L1-2 discitis-osteomyelitis on abdominal CT. EXAM: MRI LUMBAR SPINE WITHOUT AND WITH CONTRAST TECHNIQUE: Multiplanar and multiecho pulse sequences of the lumbar spine were obtained without and with intravenous contrast. CONTRAST:  7 mL Gadavist COMPARISON:  CT abdomen and pelvis 06/16/2018 FINDINGS: Segmentation:  Standard. Alignment:  Normal. Vertebrae: There are erosive changes involving the L2 superior greater than L1 inferior endplates with extensive marrow edema and enhancement in the L2 greater than L1 vertebral bodies. There is  asymmetric moderate disc space loss at L1-2. There is at most minimal ventral epidural phlegmon at L1-2 without evidence of epidural abscess. Bone marrow signal elsewhere is mildly heterogeneous without a suspicious focal lesion identified. No fracture is evident. Conus medullaris and cauda equina: Conus extends to the upper L2 level. Conus and cauda equina appear normal. Paraspinal and other soft tissues: Extensive right-sided and milder left-sided paravertebral soft tissue inflammation centered at L1-2. Partially visualized rim enhancing fluid collections on the right extending from L1-2 superiorly into the region of the crus of the right hemidiaphragm with the larger collection measuring approximately 5 cm in length. Disc levels: T12-L1: Negative. L1-2: Discitis-osteomyelitis as described above. Mild left neural foraminal narrowing. No spinal stenosis. L2-3: Mild left facet hypertrophy without disc herniation or stenosis. L3-4: Minimal disc bulging and mild facet hypertrophy without stenosis. L4-5: Disc bulging greater to the right and moderate facet hypertrophy result in mild right neural foraminal stenosis without spinal stenosis. Left  larger than right facet joint effusions. L5-S1: Mild facet arthrosis without stenosis. 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 Electronically Signed   By: Logan Bores M.D.   On: 06/16/2018 17:00   Mr Foot Right Wo Contrast  Result Date: 06/18/2018 CLINICAL DATA:  Right forefoot pain and swelling in a diabetic patient for 6 weeks. No known injury. EXAM: MRI OF THE RIGHT FOREFOOT WITHOUT CONTRAST TECHNIQUE: Multiplanar, multisequence MR imaging of the right forefoot was performed. No intravenous contrast was administered. COMPARISON:  Plain films right foot 06/16/2018 FINDINGS: Bones/Joint/Cartilage No acute bony or joint abnormality is identified. There is no bone marrow signal change to  suggest osteomyelitis. Severe degenerative disease about the tarsometatarsal joints is worst at the first TMT joint. Ligaments Intact. Muscles and Tendons Intact. No intramuscular fluid collection. Fatty atrophy of intrinsic musculature the foot is noted. Soft tissues Small volume of fluid subjacent to the first tarsometatarsal joint measures 1 cm transverse by 1.2 cm craniocaudal by 1.1 cm long and likely represents an adventitial bursa. IMPRESSION: Negative for acute bony or joint abnormality. Advanced osteoarthritis about the tarsometatarsal joints appears worst at the first TMT joint. Small volume of fluid in the subcutaneous tissues deep to the first TMT joint likely represents adventitial bursa formation. Abscess is possible but thought less likely. Electronically Signed   By: Inge Rise M.D.   On: 06/18/2018 10:24   US Arterial Abi (screening Lower Extremity)  Result Date: 06/17/2018 CLINICAL DATA:  Diabetes, right medial diabetic wound ulcer x4 days. Hypertension, hyperlipidemia. EXAM: NONINVASIVE PHYSIOLOGIC VASCULAR STUDY OF BILATERAL LOWER EXTREMITIES TECHNIQUE: Evaluation of both lower extremities were performed at rest, including calculation of ankle-brachial indices with single level Doppler, pressure and pulse volume recording. COMPARISON:  None. FINDINGS: Right ABI:  1.06 Left ABI:  1.0 Right Lower Extremity:  Normal arterial waveforms at the ankle. Left Lower Extremity:  Normal arterial waveforms at the ankle. IMPRESSION: No evidence of hemodynamically significant lower extremity arterial occlusive disease at rest. Electronically Signed   By: Lucrezia Europe M.D.   On: 06/17/2018 10:16   Dg Foot Complete Right  Result Date: 06/16/2018 CLINICAL DATA:  67 year old female with foot ulcer EXAM: RIGHT FOOT COMPLETE - 3+ VIEW COMPARISON:  None. FINDINGS: Circumferential soft tissue swelling of the forefoot. No radiopaque foreign body. No lytic changes visualized. Degenerative changes of the  interphalangeal joints. Neuropathic midfoot changes including sclerotic changes, disruption of the Lisfranc joint/disorganization, loss of normal joint spaces of the cuneiform is a and cuboid as well as the cuboid navicular joint. Degenerative changes of the hindfoot. IMPRESSION: Circumferential soft tissue swelling of the forefoot, should be amenable to direct inspection. Neuropathic changes of the midfoot, including disruption of the Lisfranc joint. Electronically Signed   By: Corrie Mckusick D.O.   On: 06/16/2018 19:44   Ct Renal Stone Study  Result Date: 06/16/2018 CLINICAL DATA:  Low back pain radiating into the stomach.  Nausea. EXAM: CT ABDOMEN AND PELVIS WITHOUT CONTRAST TECHNIQUE: Multidetector CT imaging of the abdomen and pelvis was performed following the standard protocol without IV contrast. COMPARISON:  None. FINDINGS: Lower chest: Heart size is mildly enlarged. Calcific coronary artery calcifications are identified. Mild dependent atelectasis noted. No pleural or pericardial effusion. Hepatobiliary: Multiple small stones are seen layering dependently within the gallbladder. There is no inflammatory change about the gallbladder. The liver and biliary tree appear normal. Pancreas: Unremarkable. No pancreatic ductal dilatation or surrounding inflammatory changes. The pancreas is atrophic.  Spleen: Normal in size without focal abnormality. Adrenals/Urinary Tract: Small, benign left adrenal adenoma is noted. Right adrenal gland appears normal. The kidneys, ureters and urinary bladder are unremarkable. No hydronephrosis or stones. Stomach/Bowel: Stomach is within normal limits. Appendix appears normal. No evidence of bowel wall thickening, distention, or inflammatory changes. Vascular/Lymphatic: Aortic atherosclerosis. No enlarged abdominal or pelvic lymph nodes. Reproductive: Status post hysterectomy. No adnexal masses. Other: Trace amount of presacral fluid is noted. Musculoskeletal: There is endplate  destructive change at L1-2, much worse in the superior endplate of L2. Surrounding paraspinous inflammatory change is worse on the right. Imaged bones otherwise appear normal. IMPRESSION: Findings highly suspicious for discitis and osteomyelitis at L1-2 with associated paraspinous inflammatory change. MRI of the lumbar spine with and without contrast is recommended for further evaluation. Cardiomegaly. Gallstones without evidence of cholecystitis. Calcific aortic and coronary atherosclerosis. These results were called by telephone at the time of interpretation on 06/16/2018 at 3:39 pm to Dr. Nanda Quinton , who verbally acknowledged these results. Electronically Signed   By: Inge Rise M.D.   On: 06/16/2018 15:40    Labs:  CBC: Recent Labs    06/08/18 0548 06/08/18 2201 06/09/18 0607 06/16/18 1244 06/18/18 0500  WBC 2.7*  --  4.0 22.6* 12.6*  HGB 6.5* 9.1* 10.7* 11.6* 9.9*  HCT 20.9* 28.0* 33.4* 38.3 31.5*  PLT 123*  --  149* 424* 429*    COAGS: Recent Labs    06/17/18 0853  INR 1.04    BMP: Recent Labs    06/17/18 0549 06/17/18 0853 06/17/18 1413 06/18/18 0500  NA 135 134* 134* 132*  K 4.2 3.5 3.2* 4.5  CL 103 101 102 101  CO2 27 23 25 23   GLUCOSE 177* 247* 93 202*  BUN 23 23 20 18   CALCIUM 8.4* 8.5* 8.3* 8.2*  CREATININE 0.60 0.61 0.61 0.57  GFRNONAA >60 >60 >60 >60  GFRAA >60 >60 >60 >60    LIVER FUNCTION TESTS: Recent Labs    06/16/18 1451  BILITOT 2.8*  AST 12*  ALT 10  ALKPHOS 103  PROT 7.1  ALBUMIN 2.5*    TUMOR MARKERS: No results for input(s): AFPTM, CEA, CA199, CHROMGRNA in the last 8760 hours.  Assessment and Plan: 68 y.o. female with hx HTN,DM who was admitted to [facility] on 06/16/18 with persistent mid to low back pain with radicular symptoms. Subsequent MRI L spine revealed L1-2 discitis/osteomyelitis with extensive phlegmon/?abscess in rt sided paravertebral soft tissues. Blood and urine cx have grown staph. She is afebrile. WBC 12.6. Request  now received for image guided aspiration/possible drainage of the L1-2 disc space/surrounding soft tissues. Imaging studies have been reviewed by Dr. Vernard Gambles. Risks and benefits discussed with the patient/daughter including, but not limited to bleeding, infection, damage to adjacent structures or low yield requiring additional tests.  All of the patient's questions were answered, patient is agreeable to proceed. Consent signed and in chart.  Procedure scheduled for today    Thank you for this interesting consult.  I greatly enjoyed meeting [facility] and look forward to participating in their care.  A copy of this report was sent to the requesting provider on this date.  Electronically Signed: D. Rowe Robert, PA-C 06/18/2018, 11:11 AM   I spent a total of 25 minutes    in face to face in clinical consultation, greater than 50% of which was counseling/coordinating care for image guided aspiration/possible drainage of L1-2 disc space/adjacent soft tissues

## 2018-06-18 NOTE — Progress Notes (Signed)
  Echocardiogram 2D Echocardiogram with definity has been performed.  Darlina Sicilian M 06/18/2018, 2:46 PM

## 2018-06-18 NOTE — H&P (Addendum)
NAME:  [name], MRN:  [id number], DOB:  Sep 12, 1950, LOS: 2 ADMISSION DATE:  06/16/2018,  REFERRING MD: Interventional radiology cHIEF COMPLAINT: Status post respiratory cardiac arrest  Brief History   68 year old female brief CPR on 06/18/2018  History of present illness   Kaitlin Vargas is a 68 year old female who was admitted to any pain hospital with back pain.  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  She also has a protruding section on her right foot.  She was transferred to [facility] interventional radiology for lumbar puncture.  During that procedure after receiving fentanyl Versed she became actively bleeding to a cardiac arrest.  She required 90 seconds of CPR and 0.4 mg of Narcan with successful return of spontaneous circulation.  She did not require intubation.  Pulmonary critical care was called to the bedside and asked admit to critical care units for further evaluation and treatment.  This is a safety procedure as did not want to send her back to [facility] inside an ambulance status post cardiac respiratory arrest.  Past Medical History  Diabetic ketoacidosis Poorly controlled diabetes Failure to thrive MSSA bacteremia  Significant Hospital Events     Consults:    Procedures:  06/18/2018 of lumbar puncture resulting in respiratory leading to cardiac arrest  Significant Diagnostic Tests:  06/18/2018 2D echo>>   Micro Data:  06/19/2027 lumbar puncture>> 06/16/2018 3of4 blood cultures with MSSA  Antimicrobials:  06/18/2018 and 7>>  Interim history/subjective:  68 year old female who was transferred from [facility] for lumbar puncture of suspected discitis.  Treated with fentanyl Versed and what appears to be a respiratory arrest leading to a cardiac arrest requiring 90 seconds of CPR with 0.4 mg of Narcan without ACLS medications been administered  Objective   Blood pressure 107/83, pulse (!) 102,  temperature 99 F (37.2 C), resp. rate (!) 21, height 4\' 11"  (1.499 m), weight 74.4 kg, SpO2 96 %.        Intake/Output Summary (Last 24 hours) at 06/18/2018 1233 Last data filed at 06/18/2018 9357 Gross per 24 hour  Intake 693.6 ml  Output -  Net 693.6 ml   Filed Weights   06/16/18 2115 06/17/18 0600 06/18/18 0600  Weight: 72.5 kg 73.2 kg 74.4 kg    Examination: General: Elderly female who appears older than her stated age HENT: No JVD or lymphadenopathy is appreciated Lungs: Coarse rhonchi bilaterally with decreased breath sounds in the bases Cardiovascular: Heart sounds are regular regular rate and rhythm Abdomen: Protuberant positive bowel sounds Extremities: Right foot with protruding wound Neuro: Somewhat dull effect but able to answer questions and follow commands   Resolved Hospital Problem list     Assessment & Plan:  Status post arrest in interventional radiology, most likely respiratory arrest leading to a cardiac event.  Required 90 seconds of CPR.  Responded to 1 dose of Narcan 0.4 mg.  Admitted to intensive care at [facility] as a safety precaution. Admit to intensive care unit She will need a 2D echo either at this facility or any pain hospital Continue antimicrobial therapy for MSSA bacteremia Monitor glucose Avoid further sedation  Bacteremia with MSSA, suspected osteomyelitis of lumbar spine, right foot ulcer. Currently on Ancef Culture data from lumbar puncture pending Consider ID consult MRI of right foot questionable if she needs surgical intervention once stabilized. She will need a 2D echo to evaluate for vegetation on heart valves   Poorly  controlled diabetes mellitus with recent admission for DKA Sliding scale insulin protocol  Failure to thrive Physical and Occupational Therapy Nutrition  Best practice:  Diet: N.p.o. Pain/Anxiety/Delirium protocol (if indicated): Not indicated VAP protocol (if indicated): Not indicated DVT  prophylaxis: Subcu heparin GI prophylaxis: PPI Glucose control: Sliding scale insulin Mobility: Bedrest Code Status: Full Family Communication: Daughter and patient updated at bedside 06/18/2018 Disposition: Recommend intensive care unit from interventional radiology.  She was at [facility] and was transferred to [facility] for interventional radiology procedure.  Labs   CBC: Recent Labs  Lab 06/16/18 1244 06/18/18 0500  WBC 22.6* 12.6*  NEUTROABS 20.4*  --   HGB 11.6* 9.9*  HCT 38.3 31.5*  MCV 107.3* 101.9*  PLT 424* 429*    Basic Metabolic Panel: Recent Labs  Lab 06/17/18 0216 06/17/18 0549 06/17/18 0853 06/17/18 1413 06/18/18 0500  NA 131* 135 134* 134* 132*  K 5.4* 4.2 3.5 3.2* 4.5  CL 104 103 101 102 101  CO2 20* 27 23 25 23   GLUCOSE 131* 177* 247* 93 202*  BUN 24* 23 23 20 18   CREATININE 0.63 0.60 0.61 0.61 0.57  CALCIUM 7.9* 8.4* 8.5* 8.3* 8.2*  MG  --   --   --   --  1.4*   GFR: Estimated Creatinine Clearance: 58.4 mL/min (by C-G formula based on SCr of 0.57 mg/dL). Recent Labs  Lab 06/16/18 1244 06/16/18 1451 06/16/18 1750 06/18/18 0500  WBC 22.6*  --   --  12.6*  LATICACIDVEN  --  1.5 1.1  --     Liver Function Tests: Recent Labs  Lab 06/16/18 1451  AST 12*  ALT 10  ALKPHOS 103  BILITOT 2.8*  PROT 7.1  ALBUMIN 2.5*   Recent Labs  Lab 06/16/18 1451  LIPASE 17   No results for input(s): AMMONIA in the last 168 hours.  ABG No results found for: PHART, PCO2ART, PO2ART, HCO3, TCO2, ACIDBASEDEF, O2SAT   Coagulation Profile: Recent Labs  Lab 06/17/18 0853  INR 1.04    Cardiac Enzymes: No results for input(s): CKTOTAL, CKMB, CKMBINDEX, TROPONINI in the last 168 hours.  HbA1C: Hgb A1c MFr Bld  Date/Time Value Ref Range Status  06/07/2018 12:09 PM 8.5 (H) 4.8 - 5.6 % Final    Comment:    (NOTE) Pre diabetes:          5.7%-6.4% Diabetes:              >6.4% Glycemic control for   <7.0% adults with diabetes      CBG: Recent Labs  Lab 06/17/18 0818 06/17/18 1004 06/17/18 1130 06/17/18 1639 06/17/18 2113  GLUCAP 247* 222* 191* 93 167*    Review of Systems:   10 point review of system taken, please see HPI for positives and negatives.   Past Medical History  She,  has a past medical history of Anemia, Diabetes mellitus without complication ([place]), Hyperlipidemia, Hypertension, and Peripheral neuropathy.   Surgical History    Past Surgical History:  Procedure Laterality Date  . ABDOMINAL HYSTERECTOMY       Social History   reports that she has never smoked. She has never used smokeless tobacco. She reports that she does not drink alcohol or use drugs.   Family History   Her family history includes Cancer in her father and mother; Leukemia in her father; Stroke in her mother.   Allergies Allergies  Allergen Reactions  . Sulfa Antibiotics Rash     Home Medications  Prior to Admission medications   Medication Sig Start Date End Date Taking? Authorizing Provider  albuterol (PROVENTIL HFA;VENTOLIN HFA) 108 (90 Base) MCG/ACT inhaler Inhale 2 puffs into the lungs every 6 (six) hours as needed for wheezing or shortness of breath. 06/09/18  Yes Manuella Ghazi, Pratik D, DO  aspirin 81 MG chewable tablet Chew 81 mg by mouth every morning.    Yes [provider]  dextromethorphan-guaiFENesin (MUCINEX DM) 30-600 MG 12hr tablet Take 1 tablet by mouth 2 (two) times daily for 10 days. 06/09/18 06/19/18 Yes Shah, Pratik D, DO  glipiZIDE (GLUCOTROL XL) 10 MG 24 hr tablet Take 10 mg by mouth 2 (two) times daily.  04/28/18  Yes [provider]  lisinopril (PRINIVIL,ZESTRIL) 20 MG tablet Take 10 mg by mouth daily.  02/06/18  Yes [provider]  metFORMIN (GLUCOPHAGE) 1000 MG tablet Take 1,000 mg by mouth 2 (two) times daily with a meal.  07/02/17 07/02/18 Yes [provider]  metoprolol tartrate (LOPRESSOR) 25 MG tablet Take 25 mg by mouth daily.  01/31/18  Yes [provider]  Multiple Vitamin (THERA) TABS Take 1 tablet by mouth daily.    Yes [provider]  pantoprazole (PROTONIX) 40 MG tablet Take 1 tablet (40 mg total) by mouth daily. 06/09/18 06/09/19 Yes Shah, Pratik D, DO  pioglitazone (ACTOS) 30 MG tablet Take 30 mg by mouth daily.  04/28/18  Yes [provider]  vitamin B-12 (CYANOCOBALAMIN) 1000 MCG tablet Take 1 tablet (1,000 mcg total) by mouth daily for 30 days. 06/09/18 07/09/18 Yes Heath Lark D, DO     Critical care time: 54 min    Richardson Landry Minor ACNP Maryanna Shape PCCM Pager [phone] till 1 pm If no answer page [phone] 06/18/2018, 12:33 PM  Attending Note:  68 year old female with PMH of poorly controlled diabetes and non-compliance presenting with MSSA bacteremia.  There was a concern for lumbar osteo and patient was sent to [facility] for IR to sample.  Patient was given versed/fentanyl and decompensated from a respiratory standpoint and was coded for 90 seconds.  Patient did not require intubation.  Patient is complaining of chest pain only now.  On exam, tenderness to palpation mid chest with clear lungs.  I reviewed CT myself, osteo noted.  Discussed with PCCM-NP.  Will admit to the ICU for observation.  Continue ancef for MSSA.  2D echo ordered and pending.  Likely transfer out of the ICU in AM and to [facility] service.  The patient is critically ill with multiple organ systems failure and requires high complexity decision making for assessment and support, frequent evaluation and titration of therapies, application of advanced monitoring technologies and extensive interpretation of multiple databases.   Critical Care Time devoted to patient care services described in this note is  45  Minutes. This time reflects time of care of this signee Dr Jennet Maduro. This critical care time does not reflect procedure time, or teaching time or supervisory time of PA/NP/Med student/Med Resident etc but could involve care discussion  time.  Rush Farmer, M.D. [facility] Pulmonary/Critical Care Medicine. Pager: [phone]. After hours pager: [phone].

## 2018-06-18 NOTE — Care Management (Addendum)
CM consult noted. Discussed with attending. Patient will need weeks of IV antibiotics. Unclear at this time if this can be done at home. Will need to evaluate patient when getting closer to time of DC. Patient will need PT eval to help determine needs.

## 2018-06-18 NOTE — Sedation Documentation (Signed)
Unable to obtain pulse CPR started

## 2018-06-18 NOTE — Consult Note (Signed)
[name] for Infectious Disease    Date of Admission:  06/16/2018     Total days of antibiotics 3 cefazolin         Reason for Consult: MSSA Bacteremia, Discitis     Referring Provider: [employer]  Primary Care Provider: [name], MD   Assessment: Kaitlin Vargas is a 68 y.o. female with 4 week history of worsening/severe back pain and weakness. Found to have L1-L2 discitis/osteomyelitis and paravertebral abscess. Her blood cultures have revealed MSSA. Disc aspiration also with GPCs on stain. Urine with staph aureus as well with disseminated infection. She has already been appropriately narrowed to cefazolin therapy alone.   For her staphylococcus aureus bacteremia she will need an echocardiogram to assess for endocarditis - on exam she has no murmur, no implanted cardiac devices or valves. No artificial joints or other hardware and no signs/symptoms of other metastatic sites of infection outside her known discitis. Source most likely from her spine infection considering GPCs present on aspiration. Repeat blood cultures ordered now. Hold on PICC line until 72h no growth from these cultures.   DFU on right foot - no osteo or abscess on MRI imaging and overall appears significantly improved.   Diabetes, uncontrolled. Has been on prednisone recently for URI and blood sugars in 400s.   Plan: 1. Continue cefazolin  2. Repeat blood cultures now 3. Will need prolonged treatment for discitis/osteomyelitis with abscess   Principal Problem:   MSSA bacteremia Active Problems:   Diskitis   Cardiac arrest, cause unspecified ([place])   DKA (diabetic ketoacidosis) ([place])   Essential hypertension   Type 2 diabetes mellitus with hyperlipidemia ([place])   Diabetic foot ulcer ([place])   Acute encephalopathy   . aspirin  81 mg Oral Daily  . feeding supplement (GLUCERNA SHAKE)  237 mL Oral BID BM  . feeding supplement (PRO-STAT SUGAR FREE 64)  30 mL Oral BID  . insulin aspart   0-15 Units Subcutaneous TID WC  . insulin aspart  0-5 Units Subcutaneous QHS  . insulin glargine  15 Units Subcutaneous Daily  . lisinopril  10 mg Oral Daily  . metoprolol tartrate  25 mg Oral BID  . pantoprazole  40 mg Oral Daily  . vitamin B-12  1,000 mcg Oral Daily    HPI: Kaitlin Vargas is a 68 y.o. female with pmhx significant for type 2 diabetes, dyslipidemia, HTN, anemia.   She presented to the [facility] ER 1/27 with complaint of back pain and weakness. This has been worsening over the last 4 weeks. Work up in [place] revealed MRI with L1-L2 discitis-osteomyelitis with extensive phlegmon and abscess in right-sided paravertebral soft tissues, significantly elevated inflammatory markers, normal lactate, leukocytosis, thrombocytosis, AKI (creat 1.02), hyperglycemia (414). Blood cultures have also returned positive for MSSA in 3/4 drawn bottles.   She was transferred to [facility] for IR-guided abscess/disc aspiration with gram stain initially revealing gram positive cocci. She unfortunately had a brief cardiac arrest with induction of sedation requiring 90 sec CPR and narcan with successful ROSC. The plan was to send her back to [facility] after aspiration however decided to have PCCM admit here today for post-resuscitation care as a safety precaution.   Review of Systems  Constitutional: Negative for chills and fever.  HENT: Negative for congestion.   Eyes: Negative for blurred vision and photophobia.  Respiratory: Positive for cough (lingering over 4 weeks but improved). Negative for sputum production.   Cardiovascular: Negative for  chest pain.  Gastrointestinal: Negative for diarrhea, nausea and vomiting.  Genitourinary: Negative for dysuria.  Musculoskeletal: Positive for back pain.       Wound on right foot  Skin: Negative for rash.  Neurological: Negative for headaches.    Past Medical History:  Diagnosis Date  . Anemia   . Diabetes mellitus without complication ([place])   . Hyperlipidemia    . Hypertension   . Peripheral neuropathy     Social History   Tobacco Use  . Smoking status: Never Smoker  . Smokeless tobacco: Never Used  Substance Use Topics  . Alcohol use: Never    Frequency: Never  . Drug use: Never    Family History  Problem Relation Age of Onset  . Cancer Mother   . Stroke Mother   . Cancer Father   . Leukemia Father    Allergies  Allergen Reactions  . Sulfa Antibiotics Rash    OBJECTIVE: Blood pressure 120/61, pulse (!) 101, temperature 98.6 F (37 C), temperature source Oral, resp. rate (!) 22, height 4\' 10"  (1.473 m), weight 73.1 kg, SpO2 96 %.  Physical Exam Vitals signs reviewed.  Constitutional:      Appearance: She is well-developed.     Comments: Seated comfortably in chair.   HENT:     Mouth/Throat:     Mouth: No oral lesions.     Dentition: Normal dentition. No dental abscesses.     Pharynx: No oropharyngeal exudate.  Cardiovascular:     Rate and Rhythm: Normal rate and regular rhythm.     Heart sounds: Normal heart sounds.  Pulmonary:     Effort: Pulmonary effort is normal.     Breath sounds: Normal breath sounds.  Abdominal:     General: There is no distension.     Palpations: Abdomen is soft.     Tenderness: There is no abdominal tenderness.  Musculoskeletal:     Comments: R Foot wound pictured below - improved overall with antibiotics.   Lymphadenopathy:     Cervical: No cervical adenopathy.  Skin:    General: Skin is warm and dry.     Findings: No rash.  Neurological:     Mental Status: She is alert and oriented to person, place, and time.  Psychiatric:        Judgment: Judgment normal.     Comments: In good spirits today and engaged in care discussion    06/18/18:   06/16/18:    Lab Results Lab Results  Component Value Date   WBC 12.6 (H) 06/18/2018   HGB 9.9 (L) 06/18/2018   HCT 31.5 (L) 06/18/2018   MCV 101.9 (H) 06/18/2018   PLT 429 (H) 06/18/2018    Lab Results  Component Value Date    CREATININE 0.57 06/18/2018   BUN 18 06/18/2018   NA 132 (L) 06/18/2018   K 4.5 06/18/2018   CL 101 06/18/2018   CO2 23 06/18/2018    Lab Results  Component Value Date   ALT 10 06/16/2018   AST 12 (L) 06/16/2018   ALKPHOS 103 06/16/2018   BILITOT 2.8 (H) 06/16/2018     Microbiology: Recent Results (from the past 240 hour(s))  Urine culture     Status: Abnormal (Preliminary result)   Collection Time: 06/16/18 12:57 PM  Result Value Ref Range Status   Specimen Description   Final    URINE, CLEAN CATCH Performed at [facility], [address] [place], [place] [address]    Special Requests  Final    NONE Performed at [facility], [address] [place], [place] [address]    Culture (A)  Final    >=100,000 COLONIES/mL STAPHYLOCOCCUS AUREUS SUSCEPTIBILITIES TO FOLLOW Performed at [facility], [place] [address], [place], [place] [address]    Report Status PENDING  Incomplete  Culture, blood (routine x 2)     Status: Abnormal (Preliminary result)   Collection Time: 06/16/18  1:53 PM  Result Value Ref Range Status   Specimen Description   Final    LEFT ANTECUBITAL Performed at [facility], [address], [place], [place] [address]    Special Requests   Final    BOTTLES DRAWN AEROBIC ONLY Blood Culture results may not be optimal due to an inadequate volume of blood received in culture bottles Performed at [facility], [address], [place], [place] [address]    Culture  Setup Time   Final    AEROBIC BOTTLE ONLY GRAM POSITIVE COCCI Gram Stain Report Called to,Read Back By and Verified With: J HEARN,RN @0338  06/17/18 [facility] Performed at [facility], [address], [place], [place] [address]    Culture STAPHYLOCOCCUS AUREUS (A)  Final   Report Status PENDING  Incomplete  Culture, blood (routine x 2)     Status: Abnormal (Preliminary result)   Collection Time: 06/16/18  2:51 PM  Result Value Ref Range Status   Specimen Description   Final    BLOOD BLOOD RIGHT  WRIST Performed at [facility], [address], [place], [place] [address]    Special Requests   Final    BOTTLES DRAWN AEROBIC AND ANAEROBIC Blood Culture results may not be optimal due to an inadequate volume of blood received in culture bottles Performed at [place] [place], [place] [address]    Culture  Setup Time   Final    IN BOTH AEROBIC AND ANAEROBIC BOTTLES GRAM POSITIVE COCCI Gram Stain Report Called to,Read Back By and Verified With: J HEARN,RN @0338  06/17/18 [facility] CRITICAL RESULT CALLED TO, READ BACK BY AND VERIFIED WITH: PHARMD L POOLE [id number] MLM    Culture (A)  Final    STAPHYLOCOCCUS AUREUS SUSCEPTIBILITIES TO FOLLOW Performed at [facility], [place] [address] [place],  [address]    Report Status PENDING  Incomplete  Blood Culture ID Panel (Reflexed)     Status: Abnormal   Collection Time: 06/16/18  2:51 PM  Result Value Ref Range Status   Enterococcus species NOT DETECTED NOT DETECTED Final   Listeria monocytogenes NOT DETECTED NOT DETECTED Final   Staphylococcus species DETECTED (A) NOT DETECTED Final    Comment: CRITICAL RESULT CALLED TO, READ BACK BY AND VERIFIED WITH: PHARMD L POOLE [id number] 0800 MLM    Staphylococcus aureus (BCID) DETECTED (A) NOT DETECTED Final    Comment: Methicillin (oxacillin) susceptible Staphylococcus aureus (MSSA). Preferred therapy is anti staphylococcal beta lactam antibiotic (Cefazolin or Nafcillin), unless clinically contraindicated. CRITICAL RESULT CALLED TO, READ BACK BY AND VERIFIED WITH: PHARMD L POOLE [id number] 0800 MLM    Methicillin resistance NOT DETECTED NOT DETECTED Final   Streptococcus species NOT DETECTED NOT DETECTED Final   Streptococcus agalactiae NOT DETECTED NOT DETECTED Final   Streptococcus pneumoniae NOT DETECTED NOT DETECTED Final   Streptococcus pyogenes NOT DETECTED NOT DETECTED Final   Acinetobacter baumannii NOT DETECTED NOT DETECTED Final   Enterobacteriaceae species NOT  DETECTED NOT DETECTED Final   Enterobacter cloacae complex NOT DETECTED NOT DETECTED Final   Escherichia  coli NOT DETECTED NOT DETECTED Final   Klebsiella oxytoca NOT DETECTED NOT DETECTED Final   Klebsiella pneumoniae NOT DETECTED NOT DETECTED Final   Proteus species NOT DETECTED NOT DETECTED Final   Serratia marcescens NOT DETECTED NOT DETECTED Final   Haemophilus influenzae NOT DETECTED NOT DETECTED Final   Neisseria meningitidis NOT DETECTED NOT DETECTED Final   Pseudomonas aeruginosa NOT DETECTED NOT DETECTED Final   Candida albicans NOT DETECTED NOT DETECTED Final   Candida glabrata NOT DETECTED NOT DETECTED Final   Candida krusei NOT DETECTED NOT DETECTED Final   Candida parapsilosis NOT DETECTED NOT DETECTED Final   Candida tropicalis NOT DETECTED NOT DETECTED Final    Comment: Performed at [facility], [place] [address] [place], [place] [address]  Aerobic/Anaerobic Culture (surgical/deep wound)     Status: None (Preliminary result)   Collection Time: 06/18/18 12:02 PM  Result Value Ref Range Status   Specimen Description VERTEBRA DISC  Final   Special Requests NONE  Final   Gram Stain   Final    ABUNDANT WBC PRESENT, PREDOMINANTLY PMN ABUNDANT GRAM POSITIVE COCCI IN PAIRS IN CLUSTERS Performed at [facility], [place] [address], [place], [place] [address]    Culture PENDING  Incomplete   Report Status PENDING  Incomplete    Janene Madeira, MSN, NP-C [place] for Infectious [place] Pager: [phone]  06/18/2018 3:02 PM

## 2018-06-18 NOTE — Sedation Documentation (Signed)
CPR lasted for 90 seconds patient become responsive and was given Narcan 0.4mg  IVP. After medication patient became more alert.

## 2018-06-19 DIAGNOSIS — L602 Onychogryphosis: Secondary | ICD-10-CM

## 2018-06-19 DIAGNOSIS — B9561 Methicillin susceptible Staphylococcus aureus infection as the cause of diseases classified elsewhere: Secondary | ICD-10-CM

## 2018-06-19 DIAGNOSIS — M62838 Other muscle spasm: Secondary | ICD-10-CM

## 2018-06-19 LAB — QUANTIFERON-TB GOLD PLUS

## 2018-06-19 LAB — GLUCOSE, CAPILLARY
GLUCOSE-CAPILLARY: 207 mg/dL — AB (ref 70–99)
Glucose-Capillary: 180 mg/dL — ABNORMAL HIGH (ref 70–99)
Glucose-Capillary: 303 mg/dL — ABNORMAL HIGH (ref 70–99)
Glucose-Capillary: 220 mg/dL — ABNORMAL HIGH (ref 70–99)

## 2018-06-19 LAB — CULTURE, BLOOD (ROUTINE X 2)

## 2018-06-19 LAB — CBC
HCT: 28.9 % — ABNORMAL LOW (ref 36.0–46.0)
Hemoglobin: 9.2 g/dL — ABNORMAL LOW (ref 12.0–15.0)
MCH: 31.8 pg (ref 26.0–34.0)
MCHC: 31.8 g/dL (ref 30.0–36.0)
MCV: 100 fL (ref 80.0–100.0)
Platelets: 388 10*3/uL (ref 150–400)
RBC: 2.89 MIL/uL — ABNORMAL LOW (ref 3.87–5.11)
RDW: 19.5 % — ABNORMAL HIGH (ref 11.5–15.5)
WBC: 9.6 10*3/uL (ref 4.0–10.5)
nRBC: 0 % (ref 0.0–0.2)

## 2018-06-19 LAB — BASIC METABOLIC PANEL
Anion gap: 8 (ref 5–15)
BUN: 10 mg/dL (ref 8–23)
CO2: 24 mmol/L (ref 22–32)
Calcium: 8 mg/dL — ABNORMAL LOW (ref 8.9–10.3)
Chloride: 100 mmol/L (ref 98–111)
Creatinine, Ser: 0.56 mg/dL (ref 0.44–1.00)
GFR calc Af Amer: >60 mL/min (ref >60)
Glucose, Bld: 186 mg/dL — ABNORMAL HIGH (ref 70–99)
Potassium: 3.7 mmol/L (ref 3.5–5.1)
SODIUM: 132 mmol/L — AB (ref 135–145)

## 2018-06-19 LAB — MRSA PCR SCREENING: MRSA by PCR: NEGATIVE

## 2018-06-19 MED ORDER — FENTANYL CITRATE (PF) 100 MCG/2ML IJ SOLN
25.0000 ug | INTRAMUSCULAR | Status: DC | PRN
  Administered 2018-06-19 – 2018-06-23 (×7): 50 ug via INTRAVENOUS
  Administered 2018-06-24 (×2): 25 ug via INTRAVENOUS
  Administered 2018-06-24: 50 ug via INTRAVENOUS
  Administered 2018-06-24: 25 ug via INTRAVENOUS
  Administered 2018-06-25 (×3): 50 ug via INTRAVENOUS
  Filled 2018-06-19 (×16): qty 2

## 2018-06-19 MED ORDER — SODIUM CHLORIDE 0.9 % IV SOLN
INTRAVENOUS | Status: DC | PRN
  Administered 2018-06-19 – 2018-06-20 (×2): 250 mL via INTRAVENOUS

## 2018-06-19 NOTE — Progress Notes (Signed)
Referring Physician(s): Thomasenia Bottoms  Supervising Physician: Corrie Mckusick  Patient Status:  [facility] - In-pt  Chief Complaint: Back/leg pain  Subjective: Pt still having back pain along with muscle spasms, pain radiating into LE's, R> L: denies  fever,HA,CP, worsening dyspnea,N/V   Allergies: Sulfa antibiotics  Medications: Prior to Admission medications   Medication Sig Start Date End Date Taking? Authorizing Provider  albuterol (PROVENTIL HFA;VENTOLIN HFA) 108 (90 Base) MCG/ACT inhaler Inhale 2 puffs into the lungs every 6 (six) hours as needed for wheezing or shortness of breath. 06/09/18  Yes Manuella Ghazi, Pratik D, DO  aspirin 81 MG chewable tablet Chew 81 mg by mouth every morning.    Yes [provider]  dextromethorphan-guaiFENesin (MUCINEX DM) 30-600 MG 12hr tablet Take 1 tablet by mouth 2 (two) times daily for 10 days. 06/09/18 06/19/18 Yes Shah, Pratik D, DO  glipiZIDE (GLUCOTROL XL) 10 MG 24 hr tablet Take 10 mg by mouth 2 (two) times daily.  04/28/18  Yes [provider]  lisinopril (PRINIVIL,ZESTRIL) 20 MG tablet Take 10 mg by mouth daily.  02/06/18  Yes [provider]  metFORMIN (GLUCOPHAGE) 1000 MG tablet Take 1,000 mg by mouth 2 (two) times daily with a meal.  07/02/17 07/02/18 Yes [provider]  metoprolol tartrate (LOPRESSOR) 25 MG tablet Take 25 mg by mouth daily.  01/31/18  Yes [provider]  Multiple Vitamin (THERA) TABS Take 1 tablet by mouth daily.    Yes [provider]  pantoprazole (PROTONIX) 40 MG tablet Take 1 tablet (40 mg total) by mouth daily. 06/09/18 06/09/19 Yes Shah, Pratik D, DO  pioglitazone (ACTOS) 30 MG tablet Take 30 mg by mouth daily.  04/28/18  Yes [provider]  vitamin B-12 (CYANOCOBALAMIN) 1000 MCG tablet Take 1 tablet (1,000 mcg total) by mouth daily for 30 days. 06/09/18 07/09/18 Yes Shah, Pratik D, DO     Vital Signs: BP (!) 160/94   Pulse 99   Temp 98.3 F (36.8 C) (Oral)   Resp 19    Ht 4\' 10"  (1.473 m)   Wt 161 lb 2.5 oz (73.1 kg)   SpO2 96%   BMI 33.68 kg/m   Physical Exam awake/alert; puncture site L1-2 clean and dry, not sig tender, moving all fours but pain in LE's with movement  Imaging: Mr Lumbar Spine W Wo Contrast  Result Date: 06/16/2018 CLINICAL DATA:  Low back pain. Findings concerning for L1-2 discitis-osteomyelitis on abdominal CT. EXAM: MRI LUMBAR SPINE WITHOUT AND WITH CONTRAST TECHNIQUE: Multiplanar and multiecho pulse sequences of the lumbar spine were obtained without and with intravenous contrast. CONTRAST:  7 mL Gadavist COMPARISON:  CT abdomen and pelvis 06/16/2018 FINDINGS: Segmentation:  Standard. Alignment:  Normal. Vertebrae: There are erosive changes involving the L2 superior greater than L1 inferior endplates with extensive marrow edema and enhancement in the L2 greater than L1 vertebral bodies. There is asymmetric moderate disc space loss at L1-2. There is at most minimal ventral epidural phlegmon at L1-2 without evidence of epidural abscess. Bone marrow signal elsewhere is mildly heterogeneous without a suspicious focal lesion identified. No fracture is evident. Conus medullaris and cauda equina: Conus extends to the upper L2 level. Conus and cauda equina appear normal. Paraspinal and other soft tissues: Extensive right-sided and milder left-sided paravertebral soft tissue inflammation centered at L1-2. Partially visualized rim enhancing fluid collections on the right extending from L1-2 superiorly into the region of the crus of the right hemidiaphragm with the larger collection measuring approximately 5  cm in length. Disc levels: T12-L1: Negative. L1-2: Discitis-osteomyelitis as described above. Mild left neural foraminal narrowing. No spinal stenosis. L2-3: Mild left facet hypertrophy without disc herniation or stenosis. L3-4: Minimal disc bulging and mild facet hypertrophy without stenosis. L4-5: Disc bulging greater to the right and moderate facet  hypertrophy result in mild right neural foraminal stenosis without spinal stenosis. Left larger than right facet joint effusions. L5-S1: Mild facet arthrosis without stenosis. 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 Electronically Signed   By: Logan Bores M.D.   On: 06/16/2018 17:00   Mr Foot Right Wo Contrast  Result Date: 06/18/2018 CLINICAL DATA:  Right forefoot pain and swelling in a diabetic patient for 6 weeks. No known injury. EXAM: MRI OF THE RIGHT FOREFOOT WITHOUT CONTRAST TECHNIQUE: Multiplanar, multisequence MR imaging of the right forefoot was performed. No intravenous contrast was administered. COMPARISON:  Plain films right foot 06/16/2018 FINDINGS: Bones/Joint/Cartilage No acute bony or joint abnormality is identified. There is no bone marrow signal change to suggest osteomyelitis. Severe degenerative disease about the tarsometatarsal joints is worst at the first TMT joint. Ligaments Intact. Muscles and Tendons Intact. No intramuscular fluid collection. Fatty atrophy of intrinsic musculature the foot is noted. Soft tissues Small volume of fluid subjacent to the first tarsometatarsal joint measures 1 cm transverse by 1.2 cm craniocaudal by 1.1 cm long and likely represents an adventitial bursa. IMPRESSION: Negative for acute bony or joint abnormality. Advanced osteoarthritis about the tarsometatarsal joints appears worst at the first TMT joint. Small volume of fluid in the subcutaneous tissues deep to the first TMT joint likely represents adventitial bursa formation. Abscess is possible but thought less likely. Electronically Signed   By: Inge Rise M.D.   On: 06/18/2018 10:24   US Arterial Abi (screening Lower Extremity)  Result Date: 06/17/2018 CLINICAL DATA:  Diabetes, right medial diabetic wound ulcer x4 days. Hypertension, hyperlipidemia. EXAM: NONINVASIVE PHYSIOLOGIC VASCULAR STUDY OF BILATERAL LOWER  EXTREMITIES TECHNIQUE: Evaluation of both lower extremities were performed at rest, including calculation of ankle-brachial indices with single level Doppler, pressure and pulse volume recording. COMPARISON:  None. FINDINGS: Right ABI:  1.06 Left ABI:  1.0 Right Lower Extremity:  Normal arterial waveforms at the ankle. Left Lower Extremity:  Normal arterial waveforms at the ankle. IMPRESSION: No evidence of hemodynamically significant lower extremity arterial occlusive disease at rest. Electronically Signed   By: Lucrezia Europe M.D.   On: 06/17/2018 10:16   Dg Foot Complete Right  Result Date: 06/16/2018 CLINICAL DATA:  68 year old female with foot ulcer EXAM: RIGHT FOOT COMPLETE - 3+ VIEW COMPARISON:  None. FINDINGS: Circumferential soft tissue swelling of the forefoot. No radiopaque foreign body. No lytic changes visualized. Degenerative changes of the interphalangeal joints. Neuropathic midfoot changes including sclerotic changes, disruption of the Lisfranc joint/disorganization, loss of normal joint spaces of the cuneiform is a and cuboid as well as the cuboid navicular joint. Degenerative changes of the hindfoot. IMPRESSION: Circumferential soft tissue swelling of the forefoot, should be amenable to direct inspection. Neuropathic changes of the midfoot, including disruption of the Lisfranc joint. Electronically Signed   By: Corrie Mckusick D.O.   On: 06/16/2018 19:44   Ct Renal Stone Study  Result Date: 06/16/2018 CLINICAL DATA:  Low back pain radiating into the stomach.  Nausea. EXAM: CT ABDOMEN AND PELVIS WITHOUT CONTRAST TECHNIQUE: Multidetector CT imaging of the abdomen and pelvis was performed following the standard protocol without IV contrast. COMPARISON:  None. FINDINGS:  Lower chest: Heart size is mildly enlarged. Calcific coronary artery calcifications are identified. Mild dependent atelectasis noted. No pleural or pericardial effusion. Hepatobiliary: Multiple small stones are seen layering  dependently within the gallbladder. There is no inflammatory change about the gallbladder. The liver and biliary tree appear normal. Pancreas: Unremarkable. No pancreatic ductal dilatation or surrounding inflammatory changes. The pancreas is atrophic. Spleen: Normal in size without focal abnormality. Adrenals/Urinary Tract: Small, benign left adrenal adenoma is noted. Right adrenal gland appears normal. The kidneys, ureters and urinary bladder are unremarkable. No hydronephrosis or stones. Stomach/Bowel: Stomach is within normal limits. Appendix appears normal. No evidence of bowel wall thickening, distention, or inflammatory changes. Vascular/Lymphatic: Aortic atherosclerosis. No enlarged abdominal or pelvic lymph nodes. Reproductive: Status post hysterectomy. No adnexal masses. Other: Trace amount of presacral fluid is noted. Musculoskeletal: There is endplate destructive change at L1-2, much worse in the superior endplate of L2. Surrounding paraspinous inflammatory change is worse on the right. Imaged bones otherwise appear normal. IMPRESSION: Findings highly suspicious for discitis and osteomyelitis at L1-2 with associated paraspinous inflammatory change. MRI of the lumbar spine with and without contrast is recommended for further evaluation. Cardiomegaly. Gallstones without evidence of cholecystitis. Calcific aortic and coronary atherosclerosis. These results were called by telephone at the time of interpretation on 06/16/2018 at 3:39 pm to Dr. Nanda Quinton , who verbally acknowledged these results. Electronically Signed   By: Inge Rise M.D.   On: 06/16/2018 15:40   Ir Lumbar Disc Aspiration W/img Guide  Result Date: 06/18/2018 INDICATION: Diabetes, bacteremia, low back pain, suspected discitis L1-2 on recent MR. EXAM: LUMBAR L1-2 DISC ASPIRATION UNDER FLUOROSCOPY MEDICATIONS: Lidocaine 1% subcutaneous ANESTHESIA/SEDATION: Intravenous Fentanyl and Versed were administered as conscious sedation during  continuous monitoring of the patient's level of consciousness and physiological / cardiorespiratory status by the radiology RN, with a total moderate sedation time of 15 minutes. PROCEDURE: Informed written consent was obtained from the patient after a thorough discussion of the procedural risks, benefits and alternatives. All questions were addressed. Maximal Sterile Barrier Technique was utilized including caps, mask, sterile gowns, sterile gloves, sterile drape, hand hygiene and skin antiseptic. A timeout was performed prior to the initiation of the procedure. Patient placed prone. The appropriate interspace was identified under fluoroscopy, corresponding to previous cross-sectional imaging. An appropriate skin entry site was determined. After local infiltration with 1% lidocaine, a 17 gauge trocar needle was advanced into the lumbar L1-2 interspace from left posterolateral extraforaminal approach. Needle tip position within the interspace confirmed on biplane images. Approximately 2 mL of thin purulent-appearing material were aspirated, sent for the requested laboratory studies. Towards the end of the case, accurate O2 sats were not being displayed. Bradycardia was identified. The patient was urgently turned supine onto the stretcher. Apnea was noted. Resuscitation efforts were initiated, including Narcan, with return of spontaneous breathing, good circulation, patient able to follow commands, pain-free. Patient was transferred to the ICU for observation. FLUOROSCOPY TIME:  54 seconds; 57 mGy COMPLICATIONS: Transient apnea and bradycardia, presumably sedation related. SIR level B: Nominal therapy (including overnight admission for observation), no consequence. IMPRESSION: Technically successful lumbar L1-2 disc aspiration under fluoroscopy. Electronically Signed   By: Lucrezia Europe M.D.   On: 06/18/2018 16:03    Labs:  CBC: Recent Labs    06/09/18 0607 06/16/18 1244 06/18/18 0500 06/19/18 0426  WBC 4.0  22.6* 12.6* 9.6  HGB 10.7* 11.6* 9.9* 9.2*  HCT 33.4* 38.3 31.5* 28.9*  PLT 149* 424* 429* 388  COAGS: Recent Labs    06/17/18 0853  INR 1.04    BMP: Recent Labs    06/17/18 0853 06/17/18 1413 06/18/18 0500 06/19/18 0426  NA 134* 134* 132* 132*  K 3.5 3.2* 4.5 3.7  CL 101 102 101 100  CO2 23 25 23 24   GLUCOSE 247* 93 202* 186*  BUN 23 20 18 10   CALCIUM 8.5* 8.3* 8.2* 8.0*  CREATININE 0.61 0.61 0.57 0.56  GFRNONAA >60 >60 >60 >60  GFRAA >60 >60 >60 >60    LIVER FUNCTION TESTS: Recent Labs    06/16/18 1451  BILITOT 2.8*  AST 12*  ALT 10  ALKPHOS 103  PROT 7.1  ALBUMIN 2.5*    Assessment and Plan: 68 y.o. female with hx HTN,DM who was admitted to [facility] on 06/16/18 with persistent mid to low back pain with radicular symptoms. Subsequent MRI L spine revealed L1-2 discitis/osteomyelitis with extensive phlegmon/?abscess in rt sided paravertebral soft tissues. Blood and urine cx have grown staph. S/p L1-2 disc aspiration 1/29 with subsequent transient bradycardia/apnea following conscious sedation, CPR performed, narcan administered; afebrile; creat nl; WBC nl; hgb 9.2(9.9); disc fluid cx pend; further plans as per primary/ID/CCM   Electronically Signed: D. Rowe Robert, PA-C 06/19/2018, 10:39 AM   I spent a total of 15 minutes at the the patient's bedside AND on the patient's hospital floor or unit, greater than 50% of which was counseling/coordinating care for L1-2 disc aspiration    Patient ID: Kaitlin Vargas, female   DOB: [birth year], 68 y.o.   MRN: [id number]

## 2018-06-19 NOTE — Progress Notes (Signed)
[facility] Infusion Coordinator will follow Kaitlin Vargas with ID team to support home infusion pharmacy services as ordered/needed at DC to home.  If patient discharges after hours, please call [phone].   Larry Sierras 06/19/2018, 7:49 AM

## 2018-06-19 NOTE — Evaluation (Signed)
Physical Therapy Evaluation Patient Details Name: Kaitlin Vargas MRN: [id number] DOB: 1951-01-04 Today's Date: 06/19/2018   History of Present Illness  68yo female originally admitted to [facility] with back pain, CT found osteomyelitis of the spine and she was transferred to [facility] for a lumbar puncture procedure during which she had active bleeding and suffered cardiac arrest, required CPR for 90 seconds. Foot imaging negative for fracture. PMH PN, HTN, DM   Clinical Impression   Patient received in bed, very pleasant and willing to participate in PT eval with encouragement and education from therapist. Utilized back precautions for comfort due to ongoing back pain, and able to perform rolling with ModA and sidelying to sit with ModA, once at EOB demonstrated good trunk control and balance. She then required MaxA for sit to stand with RW, able to perform stand-pivot with MinA and RW but had episode of incontinence during transfer, RN aware. She was left up in the chair with all needs met, chair alarm active, and spouse present; nursing staff educated on PT recommendations for +2 assist and use of Stedy for back to bed. She will continue to benefit from skilled PT services in the acute setting, also recommend CIR moving forward to assist in return to prior level of function.     Follow Up Recommendations CIR    Equipment Recommendations  Other (comment)(TBD )    Recommendations for Other Services       Precautions / Restrictions Precautions Precautions: Fall;Back Precaution Comments: used logrolling  and 3/3 back precautions for comfort  Restrictions Weight Bearing Restrictions: No      Mobility  Bed Mobility Overal bed mobility: Needs Assistance Bed Mobility: Rolling;Sidelying to Sit Rolling: Mod assist Sidelying to sit: Mod assist       General bed mobility comments: ModA to roll with good form and coordination, ModA to power up trunk to sitting upright at EOB, once up able to  maintain balance with S   Transfers Overall transfer level: Needs assistance Equipment used: Rolling walker (2 wheeled) Transfers: Sit to/from [facility] Sit to Stand: Max assist Stand pivot transfers: Min assist       General transfer comment: MaxA with RW, cues for safety and sequencing; once on feet able to take pivotal steps with MinA for balance with RW   Ambulation/Gait             General Gait Details: deferred, will require +2 for safety and incontinent with standing   Stairs            Wheelchair Mobility    Modified Rankin (Stroke Patients Only)       Balance Overall balance assessment: Needs assistance Sitting-balance support: Bilateral upper extremity supported;Feet supported Sitting balance-Leahy Scale: Good     Standing balance support: Bilateral upper extremity supported;During functional activity Standing balance-Leahy Scale: Poor                               Pertinent Vitals/Pain Pain Assessment: Faces Faces Pain Scale: Hurts little more Pain Location: back and legs  Pain Descriptors / Indicators: Aching;Sore Pain Intervention(s): Limited activity within patient's tolerance;Monitored during session;Repositioned    Home Living Family/patient expects to be discharged to:: Private residence Living Arrangements: Spouse/significant other Available Help at Discharge: Family;Available 24 hours/day Type of Home: House Home Access: Ramped entrance     Home Layout: One level Home Equipment: Walker - 2 wheels;Cane - single point Additional  Comments: able to ambualte with RW and SPC prior to admission     Prior Function Level of Independence: Independent with assistive device(s)               Hand Dominance        Extremity/Trunk Assessment   Upper Extremity Assessment Upper Extremity Assessment: Defer to OT evaluation    Lower Extremity Assessment Lower Extremity Assessment: Generalized  weakness    Cervical / Trunk Assessment Cervical / Trunk Assessment: Kyphotic  Communication   Communication: No difficulties  Cognition Arousal/Alertness: Awake/alert Behavior During Therapy: WFL for tasks assessed/performed Overall Cognitive Status: Within Functional Limits for tasks assessed                                        General Comments      Exercises     Assessment/Plan    PT Assessment Patient needs continued PT services  PT Problem List Decreased strength;Decreased coordination;Cardiopulmonary status limiting activity;Pain;Decreased activity tolerance;Decreased knowledge of use of DME;Decreased balance;Decreased safety awareness;Decreased mobility;Decreased knowledge of precautions       PT Treatment Interventions DME instruction;Therapeutic exercise;Gait training;Balance training;Stair training;Neuromuscular re-education;Functional mobility training;Therapeutic activities;Patient/family education    PT Goals (Current goals can be found in the Care Plan section)  Acute Rehab PT Goals Patient Stated Goal: get stronger, go home  PT Goal Formulation: With patient/family Time For Goal Achievement: 07/03/18 Potential to Achieve Goals: Fair    Frequency Min 3X/week   Barriers to discharge        Co-evaluation               AM-PAC PT "6 Clicks" Mobility  Outcome Measure Help needed turning from your back to your side while in a flat bed without using bedrails?: A Lot Help needed moving from lying on your back to sitting on the side of a flat bed without using bedrails?: A Lot Help needed moving to and from a bed to a chair (including a wheelchair)?: A Lot Help needed standing up from a chair using your arms (e.g., wheelchair or bedside chair)?: A Lot Help needed to walk in hospital room?: A Little Help needed climbing 3-5 steps with a railing? : Total 6 Click Score: 12    End of Session Equipment Utilized During Treatment: Gait  belt Activity Tolerance: Patient tolerated treatment well Patient left: in chair;with call bell/phone within reach;with chair alarm set Nurse Communication: Mobility status;Need for lift equipment PT Visit Diagnosis: Unsteadiness on feet (R26.81);Difficulty in walking, not elsewhere classified (R26.2);Muscle weakness (generalized) (M62.81)    Time: 5015-8682 PT Time Calculation (min) (ACUTE ONLY): 30 min   Charges:   PT Evaluation $PT Eval Moderate Complexity: 1 Mod PT Treatments $Gait Training: 8-22 mins        Deniece Ree PT, DPT, CBIS  Supplemental Physical Therapist [place]    Pager [phone] Acute Rehab Office [phone]

## 2018-06-19 NOTE — Progress Notes (Signed)
NAME:  Nikkol Pai, MRN:  [id number], DOB:  [birth year], LOS: 3 ADMISSION DATE:  06/16/2018,  REFERRING MD: Interventional radiology cHIEF COMPLAINT: Status post respiratory cardiac arrest  Brief History   68 year old female brief CPR on 06/18/2018  History of present illness   Kaitlin Vargas is a 68 year old female who was admitted to any pain hospital with back pain.  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  She also has a protruding section on her right foot.  She was transferred to [facility] interventional radiology for lumbar puncture.  During that procedure after receiving fentanyl Versed she became actively bleeding to a cardiac arrest.  She required 90 seconds of CPR and 0.4 mg of Narcan with successful return of spontaneous circulation.  She did not require intubation.  Pulmonary critical care was called to the bedside and asked admit to critical care units for further evaluation and treatment.  This is a safety procedure as did not want to send her back to [facility] inside an ambulance status post cardiac respiratory arrest.  Past Medical History  Diabetic ketoacidosis Poorly controlled diabetes Failure to thrive MSSA bacteremia  Significant Hospital Events   1/29 > admit after arrest in IR 1/30 > transferred out of ICU  Consults:    Procedures:  06/18/2018 of lumbar puncture resulting in respiratory leading to cardiac arrest  Significant Diagnostic Tests:  06/18/2018 2D echo>> EF > 65%, no vegetations.  Micro Data:   Blood 1/27 > staph aureus Urine 1/27 > staph aureus Blood 1/29 >  CSF 1/29 >   Antimicrobials:  06/18/2018 and 7>>  Interim history/subjective:  No acute events.  Vitals stable. Has persistent pain right hip.  Objective   Blood pressure (!) 171/99, pulse 98, temperature 98.3 F (36.8 C), temperature source Oral, resp. rate (!) 28, height 4\' 10"  (1.473 m), weight 73.1 kg, SpO2 95 %.         Intake/Output Summary (Last 24 hours) at 06/19/2018 1132 Last data filed at 06/19/2018 0900 Gross per 24 hour  Intake 400 ml  Output 350 ml  Net 50 ml   Filed Weights   06/17/18 0600 06/18/18 0600 06/18/18 1230  Weight: 73.2 kg 74.4 kg 73.1 kg    Examination: General: Elderly female who appears older than her stated age, in [place]: [place] / AT.  MMM. Lungs: CTAB. Cardiovascular: RRR, no M/R/G Abdomen: BS x 4, S/NT/ND Extremities: Right foot wound noted Neuro: Somewhat dull effect but able to answer questions and follow commands, MAE's   Assessment & Plan:   Status post arrest in interventional radiology, most likely respiratory arrest leading to a cardiac event following conscious sedation.  Required 90 seconds of CPR.  Responded to 1 dose of Narcan 0.4 mg.  Admitted to intensive care at [facility] as a safety precaution. Stable to transfer out of ICU.  Will ask [facility] to resume her care Continue antimicrobial therapy for MSSA bacteremia Monitor glucose Avoid heavy sedation (low dose fentanyl ordered for pain)  Bacteremia with MSSA, suspected osteomyelitis of lumbar spine, right foot ulcer. Currently on Ancef ID following / managing abx Culture data from lumbar puncture pending MRI of right foot questionable if she needs surgical intervention once stabilized. Low dose fentanyl for pain  Poorly controlled diabetes mellitus with recent admission for DKA Sliding scale insulin protocol  Failure to thrive Physical and Occupational Therapy Nutrition  Best practice:  Diet: N.p.o. Pain/Anxiety/Delirium protocol (if  indicated): Not indicated VAP protocol (if indicated): Not indicated DVT prophylaxis: Subcu heparin GI prophylaxis: PPI Glucose control: Sliding scale insulin Mobility: Bedrest Code Status: Full Family Communication: Daughter and patient updated at bedside 06/18/2018, none at bedside 1/30. Disposition: Transfer to tele and back to [facility] service with PCCM  off.    Montey Hora, [facility] Pager: [phone].  If no answer, [phone] 06/19/2018, 11:41 AM

## 2018-06-19 NOTE — Progress Notes (Signed)
Rehab Admissions Coordinator Note:  Per PT recommendation, this patient was screened by Jhonnie Garner for appropriateness for an Inpatient Acute Rehab Consult.  At this time, we are recommending Inpatient Rehab consult. AC will contact MD to request an IP Rehab Consult Order.   Jhonnie Garner 06/19/2018, 5:11 PM  I can be reached at [phone].

## 2018-06-19 NOTE — Plan of Care (Signed)
  Problem: Education: Goal: Knowledge of General Education information will improve Description Including pain rating scale, medication(s)/side effects and non-pharmacologic comfort measures Outcome: Progressing   Problem: Health Behavior/Discharge Planning: Goal: Ability to manage health-related needs will improve Outcome: Progressing   Problem: Clinical Measurements: Goal: Ability to maintain clinical measurements within normal limits will improve Outcome: Progressing Goal: Will remain free from infection Outcome: Progressing Goal: Diagnostic test results will improve Outcome: Progressing Goal: Respiratory complications will improve Outcome: Progressing Goal: Cardiovascular complication will be avoided Outcome: Progressing   Problem: Activity: Goal: Risk for activity intolerance will decrease Outcome: Progressing   Problem: Nutrition: Goal: Adequate nutrition will be maintained Outcome: Progressing   Problem: Coping: Goal: Level of anxiety will decrease Outcome: Progressing   Problem: Pain Managment: Goal: General experience of comfort will improve Outcome: Progressing   Problem: Safety: Goal: Ability to remain free from injury will improve Outcome: Progressing   Problem: Skin Integrity: Goal: Risk for impaired skin integrity will decrease Outcome: Progressing   Problem: Coping: Goal: Ability to adjust to condition or change in health will improve Outcome: Progressing   Problem: Fluid Volume: Goal: Ability to maintain a balanced intake and output will improve Outcome: Progressing   Problem: Health Behavior/Discharge Planning: Goal: Ability to identify and utilize available resources and services will improve Outcome: Progressing Goal: Ability to manage health-related needs will improve Outcome: Progressing   Problem: Metabolic: Goal: Ability to maintain appropriate glucose levels will improve Outcome: Progressing   Problem: Nutritional: Goal:  Maintenance of adequate nutrition will improve Outcome: Progressing Goal: Progress toward achieving an optimal weight will improve Outcome: Progressing   Problem: Skin Integrity: Goal: Risk for impaired skin integrity will decrease Outcome: Progressing   Problem: Tissue Perfusion: Goal: Adequacy of tissue perfusion will improve Outcome: Progressing

## 2018-06-19 NOTE — Progress Notes (Signed)
[facility]  Date of Admission:  06/16/2018     Total days of antibiotics 4 Cefazolin           Patient ID: Kaitlin Vargas is a 68 y.o. female with  Principal Problem:   MSSA bacteremia Active Problems:   Diskitis   Cardiac arrest, cause unspecified ([place])   DKA (diabetic ketoacidosis) ([place])   Essential hypertension   Type 2 diabetes mellitus with hyperlipidemia ([place])   Diabetic foot ulcer ([place])   Acute encephalopathy   . aspirin  81 mg Oral Daily  . feeding supplement (GLUCERNA SHAKE)  237 mL Oral BID BM  . feeding supplement (PRO-STAT SUGAR FREE 64)  30 mL Oral BID  . insulin aspart  0-15 Units Subcutaneous TID WC  . insulin aspart  0-5 Units Subcutaneous QHS  . insulin glargine  15 Units Subcutaneous Daily  . lisinopril  10 mg Oral Daily  . metoprolol tartrate  25 mg Oral BID  . pantoprazole  40 mg Oral Daily  . vitamin B-12  1,000 mcg Oral Daily    SUBJECTIVE: Feeling better today. Still with back pain but mostly complains of spasms and pain down the back right hip/leg.   Review of Systems: Review of Systems  Constitutional: Negative for chills and fever.  Respiratory: Positive for cough. Negative for shortness of breath.   Cardiovascular: Negative for chest pain and orthopnea.  Gastrointestinal: Negative for abdominal pain and diarrhea.  Genitourinary: Negative for dysuria.  Musculoskeletal: Positive for back pain. Negative for myalgias.  Skin: Negative for rash.    Allergies  Allergen Reactions  . Sulfa Antibiotics Rash    OBJECTIVE: Vitals:   06/19/18 0600 06/19/18 0800 06/19/18 0900 06/19/18 1000  BP: (!) 165/89 (!) 152/108 (!) 152/82 (!) 160/94  Pulse: 93 97 (!) 103 99  Resp: (!) 23 (!) 22 (!) 28 19  Temp:  98.3 F (36.8 C)    TempSrc:  Oral    SpO2: 98% 96% 96% 96%  Weight:      Height:       Body mass index is 33.68 kg/m.  Physical Exam Constitutional:      Appearance: Normal appearance.  HENT:   Nose: No congestion or rhinorrhea.  Eyes:     General: No scleral icterus.    Pupils: Pupils are equal, round, and reactive to light.  Cardiovascular:     Rate and Rhythm: Normal rate and regular rhythm.     Heart sounds: No murmur.  Pulmonary:     Effort: Pulmonary effort is normal.     Breath sounds: Wheezing present.  Abdominal:     General: Abdomen is flat. There is no distension.  Musculoskeletal:     Comments: Able to lift legs off bed to gravity b/l which prompted spasm that quickly subsided.  R foot ulcer still present but redness nearly resolved.   Lymphadenopathy:     Cervical: No cervical adenopathy.  Skin:    General: Skin is warm and dry.     Capillary Refill: Capillary refill takes less than 2 seconds.  Neurological:     General: No focal deficit present.     Mental Status: She is alert and oriented to person, place, and time.    Lab Results Lab Results  Component Value Date   WBC 9.6 06/19/2018   HGB 9.2 (L) 06/19/2018   HCT 28.9 (L) 06/19/2018   MCV 100.0 06/19/2018   PLT 388 06/19/2018  Lab Results  Component Value Date   CREATININE 0.56 06/19/2018   BUN 10 06/19/2018   NA 132 (L) 06/19/2018   K 3.7 06/19/2018   CL 100 06/19/2018   CO2 24 06/19/2018    Lab Results  Component Value Date   ALT 10 06/16/2018   AST 12 (L) 06/16/2018   ALKPHOS 103 06/16/2018   BILITOT 2.8 (H) 06/16/2018     Microbiology: Recent Results (from the past 240 hour(s))  Urine culture     Status: Abnormal (Preliminary result)   Collection Time: 06/16/18 12:57 PM  Result Value Ref Range Status   Specimen Description   Final    URINE, CLEAN CATCH Performed at [facility], [address] [place], [place] [address]    Special Requests   Final    NONE Performed at [facility], [address] [place], [place] [address]    Culture (A)  Final    >=100,000 COLONIES/mL STAPHYLOCOCCUS AUREUS CULTURE REINCUBATED FOR BETTER GROWTH Performed at [facility] [address] [place], [place] [address]    Report Status PENDING  Incomplete   Organism ID, Bacteria STAPHYLOCOCCUS AUREUS (A)  Final      Susceptibility   Staphylococcus aureus - MIC*    CIPROFLOXACIN <=0.5 SENSITIVE Sensitive     GENTAMICIN <=0.5 SENSITIVE Sensitive     NITROFURANTOIN <=16 SENSITIVE Sensitive     OXACILLIN <=0.25 SENSITIVE Sensitive     TETRACYCLINE <=1 SENSITIVE Sensitive     VANCOMYCIN <=0.5 SENSITIVE Sensitive     TRIMETH/SULFA <=10 SENSITIVE Sensitive     CLINDAMYCIN <=0.25 SENSITIVE Sensitive     RIFAMPIN <=0.5 SENSITIVE Sensitive     Inducible Clindamycin NEGATIVE Sensitive     * >=100,000 COLONIES/mL STAPHYLOCOCCUS AUREUS  Culture, blood (routine x 2)     Status: Abnormal   Collection Time: 06/16/18  1:53 PM  Result Value Ref Range Status   Specimen Description   Final    LEFT ANTECUBITAL Performed at [facility], [address], [place], [place] [address]    Special Requests   Final    BOTTLES DRAWN AEROBIC ONLY Blood Culture results may not be optimal due to an inadequate volume of blood received in culture bottles Performed at [facility], [address], [place], [place] [address]    Culture  Setup Time   Final    AEROBIC BOTTLE ONLY GRAM POSITIVE COCCI Gram Stain Report Called to,Read Back By and Verified With: J HEARN,RN @0338  06/17/18 [facility] Performed at [facility], [address] [place], [place] [address]    Culture (A)  Final    STAPHYLOCOCCUS AUREUS SUSCEPTIBILITIES PERFORMED ON PREVIOUS CULTURE WITHIN THE LAST 5 DAYS. Performed at [facility], [place] [address], [place], [place] [address]    Report Status 06/19/2018 FINAL  Final  Culture, blood (routine x 2)     Status: Abnormal   Collection Time: 06/16/18  2:51 PM  Result Value Ref Range Status   Specimen Description   Final    BLOOD BLOOD RIGHT WRIST Performed at [facility], [address] [place], [place] [address]    Special Requests   Final    BOTTLES DRAWN AEROBIC AND  ANAEROBIC Blood Culture results may not be optimal due to an inadequate volume of blood received in culture bottles Performed at [facility] [place], [place] [address]    Culture  Setup Time   Final    IN BOTH AEROBIC AND ANAEROBIC BOTTLES  GRAM POSITIVE COCCI Gram Stain Report Called to,Read Back By and Verified With: J HEARN,RN @0338  06/17/18 [facility] CRITICAL RESULT CALLED TO, READ BACK BY AND VERIFIED WITH: PHARMD L POOLE [id number] MLM Performed at [facility], [place] [address] [place], [place] [address]    Culture STAPHYLOCOCCUS AUREUS (A)  Final   Report Status 06/19/2018 FINAL  Final   Organism ID, Bacteria STAPHYLOCOCCUS AUREUS  Final      Susceptibility   Staphylococcus aureus - MIC*    CIPROFLOXACIN <=0.5 SENSITIVE Sensitive     ERYTHROMYCIN >=8 RESISTANT Resistant     GENTAMICIN <=0.5 SENSITIVE Sensitive     OXACILLIN <=0.25 SENSITIVE Sensitive     TETRACYCLINE <=1 SENSITIVE Sensitive     VANCOMYCIN <=0.5 SENSITIVE Sensitive     TRIMETH/SULFA <=10 SENSITIVE Sensitive     CLINDAMYCIN <=0.25 SENSITIVE Sensitive     RIFAMPIN <=0.5 SENSITIVE Sensitive     Inducible Clindamycin NEGATIVE Sensitive     * STAPHYLOCOCCUS AUREUS  Blood Culture ID Panel (Reflexed)     Status: Abnormal   Collection Time: 06/16/18  2:51 PM  Result Value Ref Range Status   Enterococcus species NOT DETECTED NOT DETECTED Final   Listeria monocytogenes NOT DETECTED NOT DETECTED Final   Staphylococcus species DETECTED (A) NOT DETECTED Final    Comment: CRITICAL RESULT CALLED TO, READ BACK BY AND VERIFIED WITH: PHARMD L POOLE [id number] MLM    Staphylococcus aureus (BCID) DETECTED (A) NOT DETECTED Final    Comment: Methicillin (oxacillin) susceptible Staphylococcus aureus (MSSA). Preferred therapy is anti staphylococcal beta lactam antibiotic (Cefazolin or Nafcillin), unless clinically contraindicated. CRITICAL RESULT CALLED TO, READ BACK BY AND VERIFIED WITH: PHARMD L POOLE  [id number] MLM    Methicillin resistance NOT DETECTED NOT DETECTED Final   Streptococcus species NOT DETECTED NOT DETECTED Final   Streptococcus agalactiae NOT DETECTED NOT DETECTED Final   Streptococcus pneumoniae NOT DETECTED NOT DETECTED Final   Streptococcus pyogenes NOT DETECTED NOT DETECTED Final   Acinetobacter baumannii NOT DETECTED NOT DETECTED Final   Enterobacteriaceae species NOT DETECTED NOT DETECTED Final   Enterobacter cloacae complex NOT DETECTED NOT DETECTED Final   Escherichia coli NOT DETECTED NOT DETECTED Final   Klebsiella oxytoca NOT DETECTED NOT DETECTED Final   Klebsiella pneumoniae NOT DETECTED NOT DETECTED Final   Proteus species NOT DETECTED NOT DETECTED Final   Serratia marcescens NOT DETECTED NOT DETECTED Final   Haemophilus influenzae NOT DETECTED NOT DETECTED Final   Neisseria meningitidis NOT DETECTED NOT DETECTED Final   Pseudomonas aeruginosa NOT DETECTED NOT DETECTED Final   Candida albicans NOT DETECTED NOT DETECTED Final   Candida glabrata NOT DETECTED NOT DETECTED Final   Candida krusei NOT DETECTED NOT DETECTED Final   Candida parapsilosis NOT DETECTED NOT DETECTED Final   Candida tropicalis NOT DETECTED NOT DETECTED Final    Comment: Performed at [facility], [place]. [address], [place], [place] [address]  Aerobic/Anaerobic Culture (surgical/deep wound)     Status: None (Preliminary result)   Collection Time: 06/18/18 12:02 PM  Result Value Ref Range Status   Specimen Description VERTEBRA DISC  Final   Special Requests NONE  Final   Gram Stain   Final    ABUNDANT WBC PRESENT, PREDOMINANTLY PMN ABUNDANT GRAM POSITIVE COCCI IN PAIRS IN CLUSTERS Performed at [facility], [place] [address], [place], [place] [address]    Culture PENDING  Incomplete   Report Status PENDING  Incomplete    ASSESSMENT: MSSA  bacteremia - More awake today. She has GPCs growing from lumbar aspirate (removed 48mL during procedure). TTE is clear and without any  concern for vegetation or structural/valve abnormality. She is tolerating cefazolin well. Will follow repeat blood cultures and place PICC @ 72 hour mark. WBC normalized and afebrile. Would not check TEE given complication surrounding sedation with procedure yesterday, alternative source to explain and no valvular disease identified on TTE.   Discitis/Osteomyelitis with paraspinal abscess - Will need 6-8 weeks of IV therapy given abscess that was likely partially drained with aspirate. With known source    Suspect her foot was initial source of infection that unfortunately seeded her back; although she reports this ulcer to have been present only 2 days that seems unlikely. She probably needs to establish with podiatry at discharge given her diabetes, wound and thickened nails.   PLAN: 1. Continue cefazolin  2. Follow repeat BCx  3. Place PICC on Sunday if they remain w/o growth  Janene Madeira, MSN, NP-C [facility] [place] Cell: [phone] Pager: [phone]  06/19/2018  10:45 AM

## 2018-06-19 NOTE — Progress Notes (Signed)
Pt refusing to let RN do skin assessment on right side of body and resposition every 2 hours. Pt states "it hurts too bad I cant roll over to that side"

## 2018-06-20 ENCOUNTER — Inpatient Hospital Stay (HOSPITAL_COMMUNITY): Payer: Medicare HMO

## 2018-06-20 DIAGNOSIS — M4620 Osteomyelitis of vertebra, site unspecified: Secondary | ICD-10-CM

## 2018-06-20 DIAGNOSIS — R05 Cough: Secondary | ICD-10-CM

## 2018-06-20 DIAGNOSIS — E1140 Type 2 diabetes mellitus with diabetic neuropathy, unspecified: Secondary | ICD-10-CM

## 2018-06-20 DIAGNOSIS — M4640 Discitis, unspecified, site unspecified: Secondary | ICD-10-CM

## 2018-06-20 LAB — GLUCOSE, CAPILLARY
Glucose-Capillary: 189 mg/dL — ABNORMAL HIGH (ref 70–99)
Glucose-Capillary: 224 mg/dL — ABNORMAL HIGH (ref 70–99)
Glucose-Capillary: 175 mg/dL — ABNORMAL HIGH (ref 70–99)
Glucose-Capillary: 185 mg/dL — ABNORMAL HIGH (ref 70–99)

## 2018-06-20 LAB — CBC
HCT: 29.5 % — ABNORMAL LOW (ref 36.0–46.0)
Hemoglobin: 9.9 g/dL — ABNORMAL LOW (ref 12.0–15.0)
MCH: 32.8 pg (ref 26.0–34.0)
MCHC: 33.6 g/dL (ref 30.0–36.0)
MCV: 97.7 fL (ref 80.0–100.0)
Platelets: 394 10*3/uL (ref 150–400)
RBC: 3.02 MIL/uL — ABNORMAL LOW (ref 3.87–5.11)
RDW: 18.8 % — ABNORMAL HIGH (ref 11.5–15.5)
WBC: 10.8 10*3/uL — ABNORMAL HIGH (ref 4.0–10.5)
nRBC: 0 % (ref 0.0–0.2)

## 2018-06-20 LAB — BASIC METABOLIC PANEL
Anion gap: 9 (ref 5–15)
BUN: 10 mg/dL (ref 8–23)
CO2: 26 mmol/L (ref 22–32)
Calcium: 8.2 mg/dL — ABNORMAL LOW (ref 8.9–10.3)
Chloride: 94 mmol/L — ABNORMAL LOW (ref 98–111)
Creatinine, Ser: 0.51 mg/dL (ref 0.44–1.00)
GFR calc Af Amer: >60 mL/min (ref >60)
Glucose, Bld: 176 mg/dL — ABNORMAL HIGH (ref 70–99)
Potassium: 3.4 mmol/L — ABNORMAL LOW (ref 3.5–5.1)
Sodium: 129 mmol/L — ABNORMAL LOW (ref 135–145)

## 2018-06-20 LAB — URINE CULTURE: Culture: >=100000 — AB

## 2018-06-20 LAB — HEMOGLOBIN A1C
HEMOGLOBIN A1C: 8.7 % — AB (ref 4.8–5.6)
Mean Plasma Glucose: 202.99 mg/dL

## 2018-06-20 LAB — PHOSPHORUS: Phosphorus: 2.6 mg/dL (ref 2.5–4.6)

## 2018-06-20 LAB — MAGNESIUM: Magnesium: 1.6 mg/dL — ABNORMAL LOW (ref 1.7–2.4)

## 2018-06-20 MED ORDER — MAGNESIUM SULFATE 2 GM/50ML IV SOLN
2.0000 g | Freq: Once | INTRAVENOUS | Status: AC
  Administered 2018-06-20: 2 g via INTRAVENOUS
  Filled 2018-06-20: qty 50

## 2018-06-20 MED ORDER — FUROSEMIDE 10 MG/ML IJ SOLN
40.0000 mg | Freq: Two times a day (BID) | INTRAMUSCULAR | Status: DC
  Administered 2018-06-20 – 2018-06-22 (×4): 40 mg via INTRAVENOUS
  Filled 2018-06-20 (×4): qty 4

## 2018-06-20 MED ORDER — FUROSEMIDE 10 MG/ML IJ SOLN: 40.0000 mg | Freq: Once | INTRAMUSCULAR | Status: DC

## 2018-06-20 MED ORDER — METHOCARBAMOL 500 MG PO TABS
500.0000 mg | ORAL_TABLET | Freq: Four times a day (QID) | ORAL | Status: DC
  Administered 2018-06-20 – 2018-06-25 (×21): 500 mg via ORAL
  Filled 2018-06-20 (×21): qty 1

## 2018-06-20 MED ORDER — POTASSIUM CHLORIDE CRYS ER 20 MEQ PO TBCR
40.0000 meq | EXTENDED_RELEASE_TABLET | Freq: Once | ORAL | Status: AC
  Administered 2018-06-20: 40 meq via ORAL
  Filled 2018-06-20: qty 2

## 2018-06-20 MED ORDER — HEPARIN SODIUM (PORCINE) 5000 UNIT/ML IJ SOLN
5000.0000 [IU] | Freq: Three times a day (TID) | INTRAMUSCULAR | Status: DC
  Administered 2018-06-20 – 2018-06-26 (×19): 5000 [IU] via SUBCUTANEOUS
  Filled 2018-06-20 (×19): qty 1

## 2018-06-20 NOTE — Progress Notes (Signed)
Physical Therapy Treatment Patient Details Name: Kaitlin Vargas MRN: [id number] DOB: Oct 07, 1950 Today's Date: 06/20/2018    History of Present Illness 68yo female originally admitted to [facility] with back pain, CT found osteomyelitis of the spine and she was transferred to [facility] for a lumbar puncture procedure during which she had active bleeding and suffered cardiac arrest, required CPR for 90 seconds. Foot imaging negative for fracture. PMH PN, HTN, DM     PT Comments    Pt admitted with above diagnosis. Pt currently with functional limitations due to balance and endurance deficits. Pt with difficulty getting to recliner today with knees buckling while pivoting with RW and needed +2 total assist to get pt into chair.  Pt then stood to [place] with max assist to position pad in chair.  Pt will need continued PT and possibly benefit from tilt bed if standing continues to be difficult.   Pt will benefit from skilled PT to increase their independence and safety with mobility to allow discharge to the venue listed below.    Follow Up Recommendations  CIR     Equipment Recommendations  Other (comment)(TBD )    Recommendations for Other Services       Precautions / Restrictions Precautions Precautions: Fall;Back Precaution Comments: used logrolling  and 3/3 back precautions for comfort  Restrictions Weight Bearing Restrictions: No    Mobility  Bed Mobility Overal bed mobility: Needs Assistance Bed Mobility: Rolling;Sidelying to Sit Rolling: Mod assist Sidelying to sit: Mod assist       General bed mobility comments: ModA to roll with good form and coordination, ModA to power up trunk to sitting upright at EOB, once up able to maintain balance with S   Transfers Overall transfer level: Needs assistance Equipment used: Rolling walker (2 wheeled) Transfers: Sit to/from [facility] Sit to Stand: Max assist;+2 physical assistance Stand pivot transfers: Max  assist;+2 physical assistance       General transfer comment: MaxA with RW, cues for safety and sequencing; once pt on feet, began to take pivotal steps with MinA for balance with RW but pt buckled and needed total assist with gait belt to get pt safely into chair.  Then scooted pt up in chair with pad. Had to stand with [facility] for pt to be cleaned and adjust pad prior to departure.    Ambulation/Gait             General Gait Details: deferred, will require +2 for safety and incontinent with standing    Stairs             Wheelchair Mobility    Modified Rankin (Stroke Patients Only)       Balance Overall balance assessment: Needs assistance Sitting-balance support: Bilateral upper extremity supported;Feet supported Sitting balance-Leahy Scale: Good     Standing balance support: Bilateral upper extremity supported;During functional activity Standing balance-Leahy Scale: Poor Standing balance comment: Relies on Bil UE support and external support of 2 persons up to total assist as pt buckles.                             Cognition Arousal/Alertness: Awake/alert Behavior During Therapy: WFL for tasks assessed/performed Overall Cognitive Status: Within Functional Limits for tasks assessed  Exercises General Exercises - Lower Extremity Long Arc Quad: AROM;Both;15 reps;Seated    General Comments General comments (skin integrity, edema, etc.): Nurse made aware to use Maximove to get pt back to bed      Pertinent Vitals/Pain Pain Assessment: Faces Faces Pain Scale: Hurts little more Pain Location: back and legs  Pain Descriptors / Indicators: Aching;Sore Pain Intervention(s): Limited activity within patient's tolerance;Monitored during session;Premedicated before session;Repositioned;RN gave pain meds during session(RN brought muscle relaxer)    Home Living                      Prior  Function            PT Goals (current goals can now be found in the care plan section) Acute Rehab PT Goals Patient Stated Goal: get stronger, go home  Progress towards PT goals: Progressing toward goals    Frequency    Min 3X/week      PT Plan Current plan remains appropriate    Co-evaluation              AM-PAC PT "6 Clicks" Mobility   Outcome Measure  Help needed turning from your back to your side while in a flat bed without using bedrails?: A Lot Help needed moving from lying on your back to sitting on the side of a flat bed without using bedrails?: A Lot Help needed moving to and from a bed to a chair (including a wheelchair)?: Total Help needed standing up from a chair using your arms (e.g., wheelchair or bedside chair)?: A Lot Help needed to walk in hospital room?: Total Help needed climbing 3-5 steps with a railing? : Total 6 Click Score: 9    End of Session Equipment Utilized During Treatment: Gait belt Activity Tolerance: Patient limited by fatigue;Patient limited by pain Patient left: in chair;with call bell/phone within reach;with chair alarm set;with nursing/sitter in room;with family/visitor present Nurse Communication: Mobility status;Need for lift equipment PT Visit Diagnosis: Unsteadiness on feet (R26.81);Difficulty in walking, not elsewhere classified (R26.2);Muscle weakness (generalized) (M62.81)     Time: 8280-0349 PT Time Calculation (min) (ACUTE ONLY): 32 min  Charges:  $Therapeutic Exercise: 8-22 mins $Therapeutic Activity: 8-22 mins                     [place] Pager:  [phone]  Office:  [place] 06/20/2018, 1:05 PM

## 2018-06-20 NOTE — Progress Notes (Addendum)
PROGRESS NOTE    Kaitlin Vargas  DHR:[id number] DOB: [birth year] DOA: 06/16/2018 PCP: [name], MD   Brief Narrative: Patient is a 68 year old female who was admitted initially at [facility]  for back pain.  She was discharged from there on 06/10/2019 when she was treated for acute hypoxemic respiratory failure secondary to acute bronchitis.  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  She was also found to have a ulcer on her right foot.  She was transferred to [facility] for IR guided drainage of the abscess..  During the procedure ,she received fentanyl and Versed but she underwent cardiac arrest.  She required CPR of 90 seconds with return of spontaneous circulation.  Did not require intubation.  She was admitted to pulmonary critical care service.  She was transferred to hospitalist service on 06/20/18. Her blood cultures also showed MSSA.  Currently on cefazolin.  No evidence of endocarditis.  ID recommended antibiotics per 6-8 weeks.  Plan for PICC line on Saturday.  Planning for discharge to CIR.  Assessment & Plan:   Principal Problem:   MSSA bacteremia Active Problems:   Essential hypertension   Type 2 diabetes mellitus with hyperlipidemia (HCC)   Diskitis   DKA (diabetic ketoacidosis) ([name])   Diabetic foot ulcer ([name])   Cardiac arrest, cause unspecified (Kaitlin Vargas)   Acute encephalopathy  MSSA bacteremia: ID recommending cefazolin for total of 8 weeks.  Plan for PICC line on Saturday if her blood cultures sent on 1/29 remain negative.  End date of antibiotic will be on March 25. She was found to have osteomyelitis/discitis of the lumbar spine.  Disc aspiration showed gram-positive cocci on stain.  Urine culture also showed Staphylococcus aureus.  Echocardiogram did not show any vegetation.  Status post cardiac arrest: While at interventional radiology.  At brief CPR for 90 seconds.  Did not need  intubation.  Dyspnea/cough: Chest x-ray done today showed pulmonary congestion with findings of CHF.  Echocardiogram done here showed normal left ventricular ejection fraction.  Likely diastolic CHF.  She might have been volume overloaded due to fluid resuscitation after cardiac arrest. We will start her on Lasix 40 mg IV every 12 see her response. She has severe bilateral lower extremity edema.She has positive net input of 5 L since admission.  Poorly controlled diabetes mellitus/DKA: She was found to have DKA on admission.  Continue current insulin regimen.  She was on oral medications at home.  Currently she is on long-acting and sliding scale insulin here.  HbA1C of 8.5 as per 1/18.  Right foot wound: Improving with antibiotics.  Likely associated with diabetic ulcer.  MRI of the right foot did not show any osteomyelitis, fracture or dislocation.  He needs to follow-up with podiatry as an outpatient.  Hypertension: Mildly hypertensive.  Continue current medications  Deconditioning/debility: PT evaluated her and recommended CIR on discharge   Nutrition Problem: Increased nutrient needs Etiology: wound healing      DVT prophylaxis: Heparin  code Status: Full Family Communication: None present at the bedside Disposition Plan: CIR   Consultants: ID, PCCM  Procedures: None  Antimicrobials:  Anti-infectives (From admission, onward)   Start     Dose/Rate Route Frequency Ordered Stop   06/17/18 1230  ceFAZolin (ANCEF) IVPB 2g/100 mL premix     2 g 200 mL/hr over 30 Minutes Intravenous Every 8 hours 06/17/18 1131     06/16/18 1345  cefTRIAXone (ROCEPHIN) 1 g in  sodium chloride 0.9 % 100 mL IVPB     1 g 200 mL/hr over 30 Minutes Intravenous  Once 06/16/18 1340 06/16/18 1614      Subjective: Patient seen and examined the bedside this morning.  Found to be sitting at the edge of the bed and eating breakfast.  She was coughing and looked mildly dyspneic.  Denies any chest  pain.  Objective: Vitals:   06/19/18 1400 06/19/18 2014 06/19/18 2127 06/20/18 0520  BP:  (!) 176/98 (!) 152/86 (!) 177/89  Pulse:  (!) 101 98 (!) 104  Resp: 16 18  20   Temp:  98.4 F (36.9 C)  98.2 F (36.8 C)  TempSrc:  Oral  Oral  SpO2:  99%  96%  Weight:    76.2 kg  Height:        Intake/Output Summary (Last 24 hours) at 06/20/2018 1318 Last data filed at 06/20/2018 0610 Gross per 24 hour  Intake 820 ml  Output 200 ml  Net 620 ml   Filed Weights   06/18/18 0600 06/18/18 1230 06/20/18 0520  Weight: 74.4 kg 73.1 kg 76.2 kg    Examination:  General exam: Weak, average built HEENT:PERRL,Oral mucosa moist, Ear/Nose normal on gross exam Respiratory system: Bilateral decreased air entry, basilar crackles. Cardiovascular system: S1 & S2 heard, RRR. No JVD, murmurs, rubs, gallops or clicks.  Severe bilateral lower extremity  edema. Gastrointestinal system: Abdomen is nondistended, soft and nontender. No organomegaly or masses felt. Normal bowel sounds heard. Central nervous system: Alert and oriented. No focal neurological deficits. Extremities: Bilateral pedal edema, no clubbing ,no cyanosis, distal peripheral pulses palpable. Skin: No rashes, lesions or ulcers,no icterus ,no pallor Healing wound on the right foot      Data Reviewed: I have personally reviewed following labs and imaging studies  CBC: Recent Labs  Lab 06/16/18 1244 06/18/18 0500 06/19/18 0426 06/20/18 0557  WBC 22.6* 12.6* 9.6 10.8*  NEUTROABS 20.4*  --   --   --   HGB 11.6* 9.9* 9.2* 9.9*  HCT 38.3 31.5* 28.9* 29.5*  MCV 107.3* 101.9* 100.0 97.7  PLT 424* 429* 388 364   Basic Metabolic Panel: Recent Labs  Lab 06/17/18 0853 06/17/18 1413 06/18/18 0500 06/19/18 0426 06/20/18 0557  NA 134* 134* 132* 132* 129*  K 3.5 3.2* 4.5 3.7 3.4*  CL 101 102 101 100 94*  CO2 23 25 23 24 26   GLUCOSE 247* 93 202* 186* 176*  BUN 23 20 18 10 10   CREATININE 0.61 0.61 0.57 0.56 0.51  CALCIUM 8.5* 8.3*  8.2* 8.0* 8.2*  MG  --   --  1.4*  --  1.6*  PHOS  --   --   --   --  2.6   GFR: Estimated Creatinine Clearance: 57.6 mL/min (by C-G formula based on SCr of 0.51 mg/dL). Liver Function Tests: Recent Labs  Lab 06/16/18 1451  AST 12*  ALT 10  ALKPHOS 103  BILITOT 2.8*  PROT 7.1  ALBUMIN 2.5*   Recent Labs  Lab 06/16/18 1451  LIPASE 17   No results for input(s): AMMONIA in the last 168 hours. Coagulation Profile: Recent Labs  Lab 06/17/18 0853  INR 1.04   Cardiac Enzymes: No results for input(s): CKTOTAL, CKMB, CKMBINDEX, TROPONINI in the last 168 hours. BNP (last 3 results) No results for input(s): PROBNP in the last 8760 hours. HbA1C: No results for input(s): HGBA1C in the last 72 hours. CBG: Recent Labs  Lab 06/19/18 1118 06/19/18 1658 06/19/18  2107 06/20/18 0740 06/20/18 1117  GLUCAP 207* 303* 220* 189* 224*   Lipid Profile: No results for input(s): CHOL, HDL, LDLCALC, TRIG, CHOLHDL, LDLDIRECT in the last 72 hours. Thyroid Function Tests: No results for input(s): TSH, T4TOTAL, FREET4, T3FREE, THYROIDAB in the last 72 hours. Anemia Panel: No results for input(s): VITAMINB12, FOLATE, FERRITIN, TIBC, IRON, RETICCTPCT in the last 72 hours. Sepsis Labs: Recent Labs  Lab 06/16/18 1451 06/16/18 1750  LATICACIDVEN 1.5 1.1    Recent Results (from the past 240 hour(s))  Urine culture     Status: Abnormal   Collection Time: 06/16/18 12:57 PM  Result Value Ref Range Status   Specimen Description   Final    URINE, CLEAN CATCH Performed at [facility], [address] [place], [place] [address]    Special Requests   Final    NONE Performed at [facility], [address] [place], [place] [address]    Culture (A)  Final    >=100,000 COLONIES/mL STAPHYLOCOCCUS AUREUS >=100,000 COLONIES/mL GRANULICATELLA ADIACENS Standardized susceptibility testing for this organism is not available. Performed at [facility], [place] [address], [place], [place]  [address]    Report Status 06/20/2018 FINAL  Final   Organism ID, Bacteria STAPHYLOCOCCUS AUREUS (A)  Final      Susceptibility   Staphylococcus aureus - MIC*    CIPROFLOXACIN <=0.5 SENSITIVE Sensitive     GENTAMICIN <=0.5 SENSITIVE Sensitive     NITROFURANTOIN <=16 SENSITIVE Sensitive     OXACILLIN <=0.25 SENSITIVE Sensitive     TETRACYCLINE <=1 SENSITIVE Sensitive     VANCOMYCIN <=0.5 SENSITIVE Sensitive     TRIMETH/SULFA <=10 SENSITIVE Sensitive     CLINDAMYCIN <=0.25 SENSITIVE Sensitive     RIFAMPIN <=0.5 SENSITIVE Sensitive     Inducible Clindamycin NEGATIVE Sensitive     * >=100,000 COLONIES/mL STAPHYLOCOCCUS AUREUS  Culture, blood (routine x 2)     Status: Abnormal   Collection Time: 06/16/18  1:53 PM  Result Value Ref Range Status   Specimen Description   Final    LEFT ANTECUBITAL Performed at [facility], [address], [place], [place] [address]    Special Requests   Final    BOTTLES DRAWN AEROBIC ONLY Blood Culture results may not be optimal due to an inadequate volume of blood received in culture bottles Performed at [facility], [address] [place], [place] [address]    Culture  Setup Time   Final    AEROBIC BOTTLE ONLY GRAM POSITIVE COCCI Gram Stain Report Called to,Read Back By and Verified With: J HEARN,RN @0338  06/17/18 [facility] Performed at [facility], [address] [place], [place] [address]    Culture (A)  Final    STAPHYLOCOCCUS AUREUS SUSCEPTIBILITIES PERFORMED ON PREVIOUS CULTURE WITHIN THE LAST 5 DAYS. Performed at [facility], [place] [address], [place], [place] [address]    Report Status 06/19/2018 FINAL  Final  Culture, blood (routine x 2)     Status: Abnormal   Collection Time: 06/16/18  2:51 PM  Result Value Ref Range Status   Specimen Description   Final    BLOOD BLOOD RIGHT WRIST Performed at [facility], [address] [place], [place] [address]    Special Requests   Final    BOTTLES DRAWN AEROBIC AND ANAEROBIC Blood Culture  results may not be optimal due to an inadequate volume of blood received in culture bottles Performed at [facility], [address] [place],  [address]  Culture  Setup Time   Final    IN BOTH AEROBIC AND ANAEROBIC BOTTLES GRAM POSITIVE COCCI Gram Stain Report Called to,Read Back By and Verified With: J HEARN,RN @0338  06/17/18 [facility] CRITICAL RESULT CALLED TO, READ BACK BY AND VERIFIED WITH: PHARMD L POOLE [phone] MLM Performed at [facility], [place] [address] [place], [place] [address]    Culture STAPHYLOCOCCUS AUREUS (A)  Final   Report Status 06/19/2018 FINAL  Final   Organism ID, Bacteria STAPHYLOCOCCUS AUREUS  Final      Susceptibility   Staphylococcus aureus - MIC*    CIPROFLOXACIN <=0.5 SENSITIVE Sensitive     ERYTHROMYCIN >=8 RESISTANT Resistant     GENTAMICIN <=0.5 SENSITIVE Sensitive     OXACILLIN <=0.25 SENSITIVE Sensitive     TETRACYCLINE <=1 SENSITIVE Sensitive     VANCOMYCIN <=0.5 SENSITIVE Sensitive     TRIMETH/SULFA <=10 SENSITIVE Sensitive     CLINDAMYCIN <=0.25 SENSITIVE Sensitive     RIFAMPIN <=0.5 SENSITIVE Sensitive     Inducible Clindamycin NEGATIVE Sensitive     * STAPHYLOCOCCUS AUREUS  Blood Culture ID Panel (Reflexed)     Status: Abnormal   Collection Time: 06/16/18  2:51 PM  Result Value Ref Range Status   Enterococcus species NOT DETECTED NOT DETECTED Final   Listeria monocytogenes NOT DETECTED NOT DETECTED Final   Staphylococcus species DETECTED (A) NOT DETECTED Final    Comment: CRITICAL RESULT CALLED TO, READ BACK BY AND VERIFIED WITH: PHARMD L POOLE [phone] MLM    Staphylococcus aureus (BCID) DETECTED (A) NOT DETECTED Final    Comment: Methicillin (oxacillin) susceptible Staphylococcus aureus (MSSA). Preferred therapy is anti staphylococcal beta lactam antibiotic (Cefazolin or Nafcillin), unless clinically contraindicated. CRITICAL RESULT CALLED TO, READ BACK BY AND VERIFIED WITH: PHARMD L POOLE [phone] MLM     Methicillin resistance NOT DETECTED NOT DETECTED Final   Streptococcus species NOT DETECTED NOT DETECTED Final   Streptococcus agalactiae NOT DETECTED NOT DETECTED Final   Streptococcus pneumoniae NOT DETECTED NOT DETECTED Final   Streptococcus pyogenes NOT DETECTED NOT DETECTED Final   Acinetobacter baumannii NOT DETECTED NOT DETECTED Final   Enterobacteriaceae species NOT DETECTED NOT DETECTED Final   Enterobacter cloacae complex NOT DETECTED NOT DETECTED Final   Escherichia coli NOT DETECTED NOT DETECTED Final   Klebsiella oxytoca NOT DETECTED NOT DETECTED Final   Klebsiella pneumoniae NOT DETECTED NOT DETECTED Final   Proteus species NOT DETECTED NOT DETECTED Final   Serratia marcescens NOT DETECTED NOT DETECTED Final   Haemophilus influenzae NOT DETECTED NOT DETECTED Final   Neisseria meningitidis NOT DETECTED NOT DETECTED Final   Pseudomonas aeruginosa NOT DETECTED NOT DETECTED Final   Candida albicans NOT DETECTED NOT DETECTED Final   Candida glabrata NOT DETECTED NOT DETECTED Final   Candida krusei NOT DETECTED NOT DETECTED Final   Candida parapsilosis NOT DETECTED NOT DETECTED Final   Candida tropicalis NOT DETECTED NOT DETECTED Final    Comment: Performed at [facility], [place]. [address], [place], [place] [address]  MRSA PCR Screening     Status: None   Collection Time: 06/17/18 12:13 PM  Result Value Ref Range Status   MRSA by PCR NEGATIVE NEGATIVE Final    Comment:        The GeneXpert MRSA Assay (FDA approved for NASAL specimens only), is one component of a comprehensive MRSA colonization surveillance program. It is not intended to diagnose MRSA infection nor to guide or monitor treatment for MRSA infections. Performed  at [facility], [place] [address], [place], [place] [address]   Aerobic/Anaerobic Culture (surgical/deep wound)     Status: None (Preliminary result)   Collection Time: 06/18/18 12:02 PM  Result Value Ref Range Status   Specimen  Description VERTEBRA DISC  Final   Special Requests NONE  Final   Gram Stain   Final    ABUNDANT WBC PRESENT, PREDOMINANTLY PMN ABUNDANT GRAM POSITIVE COCCI IN PAIRS IN CLUSTERS Performed at [facility], [place] [address], [place], [place] [address]    Culture   Final    MODERATE STAPHYLOCOCCUS AUREUS NO ANAEROBES ISOLATED; CULTURE IN PROGRESS FOR 5 DAYS    Report Status PENDING  Incomplete   Organism ID, Bacteria STAPHYLOCOCCUS AUREUS  Final      Susceptibility   Staphylococcus aureus - MIC*    CIPROFLOXACIN <=0.5 SENSITIVE Sensitive     ERYTHROMYCIN >=8 RESISTANT Resistant     GENTAMICIN <=0.5 SENSITIVE Sensitive     OXACILLIN <=0.25 SENSITIVE Sensitive     TETRACYCLINE <=1 SENSITIVE Sensitive     VANCOMYCIN <=0.5 SENSITIVE Sensitive     TRIMETH/SULFA <=10 SENSITIVE Sensitive     CLINDAMYCIN <=0.25 SENSITIVE Sensitive     RIFAMPIN <=0.5 SENSITIVE Sensitive     Inducible Clindamycin NEGATIVE Sensitive     * MODERATE STAPHYLOCOCCUS AUREUS  Culture, blood (routine x 2)     Status: None (Preliminary result)   Collection Time: 06/18/18  4:15 PM  Result Value Ref Range Status   Specimen Description BLOOD LEFT ARM  Final   Special Requests   Final    BOTTLES DRAWN AEROBIC ONLY Blood Culture adequate volume   Culture   Final    NO GROWTH 2 DAYS Performed at [facility], [address], [place], [place] [address]    Report Status PENDING  Incomplete  Culture, blood (routine x 2)     Status: None (Preliminary result)   Collection Time: 06/18/18  4:49 PM  Result Value Ref Range Status   Specimen Description BLOOD LEFT ANTECUBITAL  Final   Special Requests   Final    BOTTLES DRAWN AEROBIC AND ANAEROBIC Blood Culture adequate volume   Culture   Final    NO GROWTH 2 DAYS Performed at [facility], [place] [address], [place], [place] [address]    Report Status PENDING  Incomplete  Aerobic Culture (superficial specimen)     Status: None (Preliminary result)    Collection Time: 06/19/18  1:04 PM  Result Value Ref Range Status   Specimen Description WOUND  Final   Special Requests PERINEAL  Final   Gram Stain   Final    NO WBC SEEN FEW YEAST Performed at [facility], [place] [address] [place], [place] [address]    Culture PENDING  Incomplete   Report Status PENDING  Incomplete         Radiology Studies: Dg Chest Port 1 View  Result Date: 06/20/2018 CLINICAL DATA:  Shortness of breath. EXAM: PORTABLE CHEST 1 VIEW COMPARISON:  Chest x-ray 06/07/2018. FINDINGS: Patient is rotated to the right. Mediastinal widening noted., this is most likely vascular related to AP portable technique. Cardiomegaly with bilateral interstitial prominence and small bilateral pleural effusions. Findings suggest CHF. Bilateral mid lung field subsegmental atelectasis. No pneumothorax. IMPRESSION: Findings consistent with congestive heart failure bilateral interstitial edema bilateral pleural effusions. Pneumonitis can not be excluded. Electronically Signed   By: Marcello Moores  Register   On: 06/20/2018 11:37  Scheduled Meds: . aspirin  81 mg Oral Daily  . feeding supplement (GLUCERNA SHAKE)  237 mL Oral BID BM  . feeding supplement (PRO-STAT SUGAR FREE 64)  30 mL Oral BID  . furosemide  40 mg Intravenous Once  . insulin aspart  0-15 Units Subcutaneous TID WC  . insulin aspart  0-5 Units Subcutaneous QHS  . insulin glargine  15 Units Subcutaneous Daily  . lisinopril  10 mg Oral Daily  . methocarbamol  500 mg Oral Q6H  . metoprolol tartrate  25 mg Oral BID  . pantoprazole  40 mg Oral Daily  . vitamin B-12  1,000 mcg Oral Daily   Continuous Infusions: . sodium chloride 250 mL (06/20/18 0610)  .  ceFAZolin (ANCEF) IV 2 g (06/20/18 0610)     LOS: 4 days    Time spent: 35 mins.More than 50% of that time was spent in counseling and/or coordination of care.      Shelly Coss, MD [facility] Pager [phone]  If 7PM-7AM, please contact  night-coverage [email or web address] Password [email or web address] 06/20/2018, 1:18 PM

## 2018-06-20 NOTE — Progress Notes (Addendum)
Inpatient Diabetes Program Recommendations  AACE/ADA: New Consensus Statement on Inpatient Glycemic Control (2015)  Target Ranges:  Prepandial:   less than 140 mg/dL      Peak postprandial:   less than 180 mg/dL (1-2 hours)      Critically ill patients:  140 - 180 mg/dL   Lab Results  Component Value Date   GLUCAP 224 (H) 06/20/2018   HGBA1C 8.5 (H) 06/07/2018    Review of Glycemic Control Results for Kaitlin Vargas, Kaitlin Vargas (MRN [id number]) as of 06/20/2018 15:30  Ref. Range 06/19/2018 11:18 06/19/2018 16:58 06/19/2018 21:07 06/20/2018 07:40 06/20/2018 11:17  Glucose-Capillary Latest Ref Range: 70 - 99 mg/dL 207 (H) 303 (H) 220 (H) 189 (H) 224 (H)   Diabetes history: Type 2 DM Outpatient Diabetes medications: Glipizide 10 mg BID, Metformin 1000 mg BID, Actos 30 mg QD Current orders for Inpatient glycemic control: Novolog 0-15 units TID, Novolog 0-5 units QHS, Lantus 15 units QD  Inpatient Diabetes Program Recommendations:  Noted consult. Patient was contacted by DM coordinator on 06/17/2018.   If post prandials continue to exceed 180 mg/dL, consider adding Novolog 3 units TID (assuming that patient is consuming >50% of meals).   Thanks, Bronson Curb, MSN, RNC-OB Diabetes Coordinator [phone] (8a-5p)

## 2018-06-20 NOTE — Progress Notes (Signed)
Unable to obtain a IV 2 attempts made. IV consulted. Patient has pending lasix and Iv antibiotics when access is regained.

## 2018-06-20 NOTE — Progress Notes (Signed)
[facility] Infectious [facility]  Date of Admission:  06/16/2018     Total days of antibiotics 4 Cefazolin           Patient ID: Kaitlin Vargas is a 68 y.o. female with  Principal Problem:   MSSA bacteremia Active Problems:   Diskitis   Cardiac arrest, cause unspecified ([place])   DKA (diabetic ketoacidosis) ([place])   Essential hypertension   Type 2 diabetes mellitus with hyperlipidemia ([place])   Diabetic foot ulcer ([place])   Acute encephalopathy   . aspirin  81 mg Oral Daily  . feeding supplement (GLUCERNA SHAKE)  237 mL Oral BID BM  . feeding supplement (PRO-STAT SUGAR FREE 64)  30 mL Oral BID  . insulin aspart  0-15 Units Subcutaneous TID WC  . insulin aspart  0-5 Units Subcutaneous QHS  . insulin glargine  15 Units Subcutaneous Daily  . lisinopril  10 mg Oral Daily  . metoprolol tartrate  25 mg Oral BID  . pantoprazole  40 mg Oral Daily  . potassium chloride  40 mEq Oral Once  . vitamin B-12  1,000 mcg Oral Daily    SUBJECTIVE: Waiting for a bath. Back pain is making it hard for her to be comfortable.  Still with cough - now "thicker but can't get it coughed up."   Review of Systems: Review of Systems  Constitutional: Negative for chills and fever.  Respiratory: Positive for cough. Negative for shortness of breath.   Cardiovascular: Negative for chest pain and orthopnea.  Gastrointestinal: Negative for abdominal pain and diarrhea.  Genitourinary: Negative for dysuria.  Musculoskeletal: Positive for back pain. Negative for myalgias.  Skin: Negative for rash.    Allergies  Allergen Reactions  . Sulfa Antibiotics Rash    OBJECTIVE: Vitals:   06/19/18 1400 06/19/18 2014 06/19/18 2127 06/20/18 0520  BP:  (!) 176/98 (!) 152/86 (!) 177/89  Pulse:  (!) 101 98 (!) 104  Resp: '16 18  20  ' Temp:  98.4 F (36.9 C)  98.2 F (36.8 C)  TempSrc:  Oral  Oral  SpO2:  99%  96%  Weight:    76.2 kg  Height:       Body mass index is 35.11 kg/m.  Physical  Exam Constitutional:      Appearance: Normal appearance.  HENT:     Nose: No congestion or rhinorrhea.  Eyes:     General: No scleral icterus.    Pupils: Pupils are equal, round, and reactive to light.  Cardiovascular:     Rate and Rhythm: Normal rate and regular rhythm.     Heart sounds: No murmur.  Pulmonary:     Effort: Pulmonary effort is normal.     Breath sounds: Wheezing and rhonchi (L>R) present.  Abdominal:     General: Abdomen is flat. There is no distension.  Musculoskeletal:     Comments: Unable to lift feet off side of bed back in bed herself.  R foot ulcer still present but redness nearly resolved.   Lymphadenopathy:     Cervical: No cervical adenopathy.  Skin:    General: Skin is warm and dry.     Capillary Refill: Capillary refill takes less than 2 seconds.  Neurological:     General: No focal deficit present.     Mental Status: She is alert and oriented to person, place, and time.     Motor: Weakness present.    Lab Results Lab Results  Component  Value Date   WBC 10.8 (H) 06/20/2018   HGB 9.9 (L) 06/20/2018   HCT 29.5 (L) 06/20/2018   MCV 97.7 06/20/2018   PLT 394 06/20/2018    Lab Results  Component Value Date   CREATININE 0.51 06/20/2018   BUN 10 06/20/2018   NA 129 (L) 06/20/2018   K 3.4 (L) 06/20/2018   CL 94 (L) 06/20/2018   CO2 26 06/20/2018    Lab Results  Component Value Date   ALT 10 06/16/2018   AST 12 (L) 06/16/2018   ALKPHOS 103 06/16/2018   BILITOT 2.8 (H) 06/16/2018     Microbiology: Recent Results (from the past 240 hour(s))  Urine culture     Status: Abnormal   Collection Time: 06/16/18 12:57 PM  Result Value Ref Range Status   Specimen Description   Final    URINE, CLEAN CATCH Performed at [facility], [address], [place], [place] [address]    Special Requests   Final    NONE Performed at [facility], [address], [place], [place] [address]    Culture (A)  Final    >=100,000 COLONIES/mL STAPHYLOCOCCUS  AUREUS >=100,000 COLONIES/mL GRANULICATELLA ADIACENS Standardized susceptibility testing for this organism is not available. Performed at [facility], [place] [address] [place], [place] [address]    Report Status 06/20/2018 FINAL  Final   Organism ID, Bacteria STAPHYLOCOCCUS AUREUS (A)  Final      Susceptibility   Staphylococcus aureus - MIC*    CIPROFLOXACIN <=0.5 SENSITIVE Sensitive     GENTAMICIN <=0.5 SENSITIVE Sensitive     NITROFURANTOIN <=16 SENSITIVE Sensitive     OXACILLIN <=0.25 SENSITIVE Sensitive     TETRACYCLINE <=1 SENSITIVE Sensitive     VANCOMYCIN <=0.5 SENSITIVE Sensitive     TRIMETH/SULFA <=10 SENSITIVE Sensitive     CLINDAMYCIN <=0.25 SENSITIVE Sensitive     RIFAMPIN <=0.5 SENSITIVE Sensitive     Inducible Clindamycin NEGATIVE Sensitive     * >=100,000 COLONIES/mL STAPHYLOCOCCUS AUREUS  Culture, blood (routine x 2)     Status: Abnormal   Collection Time: 06/16/18  1:53 PM  Result Value Ref Range Status   Specimen Description   Final    LEFT ANTECUBITAL Performed at [facility], [address], [place], [place] [address]    Special Requests   Final    BOTTLES DRAWN AEROBIC ONLY Blood Culture results may not be optimal due to an inadequate volume of blood received in culture bottles Performed at [facility], [address] [place], [place] [address]    Culture  Setup Time   Final    AEROBIC BOTTLE ONLY GRAM POSITIVE COCCI Gram Stain Report Called to,Read Back By and Verified With: J HEARN,RN '@[id number]'  06/17/18 [facility] Performed at [facility], [address] [place], [place] [address]    Culture (A)  Final    STAPHYLOCOCCUS AUREUS SUSCEPTIBILITIES PERFORMED ON PREVIOUS CULTURE WITHIN THE LAST 5 DAYS. Performed at [facility], [place] [address], [place], [place] [address]    Report Status 06/19/2018 FINAL  Final  Culture, blood (routine x 2)     Status: Abnormal   Collection Time: 06/16/18  2:51 PM  Result Value Ref Range Status   Specimen  Description   Final    BLOOD BLOOD RIGHT WRIST Performed at [facility], [address] [place] [address], [place], [place] [address]    Special Requests   Final    BOTTLES DRAWN AEROBIC AND ANAEROBIC Blood Culture results may not be  optimal due to an inadequate volume of blood received in culture bottles Performed at [place], [place] [address]    Culture  Setup Time   Final    IN BOTH AEROBIC AND ANAEROBIC BOTTLES GRAM POSITIVE COCCI Gram Stain Report Called to,Read Back By and Verified With: J HEARN,RN '@[id number]'  06/17/18 [facility] CRITICAL RESULT CALLED TO, READ BACK BY AND VERIFIED WITH: PHARMD L POOLE [id number] MLM Performed at [facility], [place] [address], [place], [place] [address]    Culture STAPHYLOCOCCUS AUREUS (A)  Final   Report Status 06/19/2018 FINAL  Final   Organism ID, Bacteria STAPHYLOCOCCUS AUREUS  Final      Susceptibility   Staphylococcus aureus - MIC*    CIPROFLOXACIN <=0.5 SENSITIVE Sensitive     ERYTHROMYCIN >=8 RESISTANT Resistant     GENTAMICIN <=0.5 SENSITIVE Sensitive     OXACILLIN <=0.25 SENSITIVE Sensitive     TETRACYCLINE <=1 SENSITIVE Sensitive     VANCOMYCIN <=0.5 SENSITIVE Sensitive     TRIMETH/SULFA <=10 SENSITIVE Sensitive     CLINDAMYCIN <=0.25 SENSITIVE Sensitive     RIFAMPIN <=0.5 SENSITIVE Sensitive     Inducible Clindamycin NEGATIVE Sensitive     * STAPHYLOCOCCUS AUREUS  Blood Culture ID Panel (Reflexed)     Status: Abnormal   Collection Time: 06/16/18  2:51 PM  Result Value Ref Range Status   Enterococcus species NOT DETECTED NOT DETECTED Final   Listeria monocytogenes NOT DETECTED NOT DETECTED Final   Staphylococcus species DETECTED (A) NOT DETECTED Final    Comment: CRITICAL RESULT CALLED TO, READ BACK BY AND VERIFIED WITH: PHARMD L POOLE [id number] [phone] MLM    Staphylococcus aureus (BCID) DETECTED (A) NOT DETECTED Final    Comment: Methicillin (oxacillin) susceptible Staphylococcus aureus (MSSA). Preferred therapy is anti  staphylococcal beta lactam antibiotic (Cefazolin or Nafcillin), unless clinically contraindicated. CRITICAL RESULT CALLED TO, READ BACK BY AND VERIFIED WITH: PHARMD L POOLE [id number] [phone] MLM    Methicillin resistance NOT DETECTED NOT DETECTED Final   Streptococcus species NOT DETECTED NOT DETECTED Final   Streptococcus agalactiae NOT DETECTED NOT DETECTED Final   Streptococcus pneumoniae NOT DETECTED NOT DETECTED Final   Streptococcus pyogenes NOT DETECTED NOT DETECTED Final   Acinetobacter baumannii NOT DETECTED NOT DETECTED Final   Enterobacteriaceae species NOT DETECTED NOT DETECTED Final   Enterobacter cloacae complex NOT DETECTED NOT DETECTED Final   Escherichia coli NOT DETECTED NOT DETECTED Final   Klebsiella oxytoca NOT DETECTED NOT DETECTED Final   Klebsiella pneumoniae NOT DETECTED NOT DETECTED Final   Proteus species NOT DETECTED NOT DETECTED Final   Serratia marcescens NOT DETECTED NOT DETECTED Final   Haemophilus influenzae NOT DETECTED NOT DETECTED Final   Neisseria meningitidis NOT DETECTED NOT DETECTED Final   Pseudomonas aeruginosa NOT DETECTED NOT DETECTED Final   Candida albicans NOT DETECTED NOT DETECTED Final   Candida glabrata NOT DETECTED NOT DETECTED Final   Candida krusei NOT DETECTED NOT DETECTED Final   Candida parapsilosis NOT DETECTED NOT DETECTED Final   Candida tropicalis NOT DETECTED NOT DETECTED Final    Comment: Performed at [facility], [place]. [address], [place], [place] [address]  MRSA PCR Screening     Status: None   Collection Time: 06/17/18 12:13 PM  Result Value Ref Range Status   MRSA by PCR NEGATIVE NEGATIVE Final    Comment:        The GeneXpert MRSA Assay (FDA approved for NASAL specimens only), is one  component of a comprehensive MRSA colonization surveillance program. It is not intended to diagnose MRSA infection nor to guide or monitor treatment for MRSA infections. Performed at [facility], [place] [address],  [place], [place] [address]   Aerobic/Anaerobic Culture (surgical/deep wound)     Status: None (Preliminary result)   Collection Time: 06/18/18 12:02 PM  Result Value Ref Range Status   Specimen Description VERTEBRA DISC  Final   Special Requests NONE  Final   Gram Stain   Final    ABUNDANT WBC PRESENT, PREDOMINANTLY PMN ABUNDANT GRAM POSITIVE COCCI IN PAIRS IN CLUSTERS Performed at [facility], [place] [address] [place], [place] [address]    Culture   Final    MODERATE STAPHYLOCOCCUS AUREUS NO ANAEROBES ISOLATED; CULTURE IN PROGRESS FOR 5 DAYS    Report Status PENDING  Incomplete   Organism ID, Bacteria STAPHYLOCOCCUS AUREUS  Final      Susceptibility   Staphylococcus aureus - MIC*    CIPROFLOXACIN <=0.5 SENSITIVE Sensitive     ERYTHROMYCIN >=8 RESISTANT Resistant     GENTAMICIN <=0.5 SENSITIVE Sensitive     OXACILLIN <=0.25 SENSITIVE Sensitive     TETRACYCLINE <=1 SENSITIVE Sensitive     VANCOMYCIN <=0.5 SENSITIVE Sensitive     TRIMETH/SULFA <=10 SENSITIVE Sensitive     CLINDAMYCIN <=0.25 SENSITIVE Sensitive     RIFAMPIN <=0.5 SENSITIVE Sensitive     Inducible Clindamycin NEGATIVE Sensitive     * MODERATE STAPHYLOCOCCUS AUREUS  Culture, blood (routine x 2)     Status: None (Preliminary result)   Collection Time: 06/18/18  4:15 PM  Result Value Ref Range Status   Specimen Description BLOOD LEFT ARM  Final   Special Requests   Final    BOTTLES DRAWN AEROBIC ONLY Blood Culture adequate volume   Culture   Final    NO GROWTH 2 DAYS Performed at [facility], [address] [place], [place] [address]    Report Status PENDING  Incomplete  Culture, blood (routine x 2)     Status: None (Preliminary result)   Collection Time: 06/18/18  4:49 PM  Result Value Ref Range Status   Specimen Description BLOOD LEFT ANTECUBITAL  Final   Special Requests   Final    BOTTLES DRAWN AEROBIC AND ANAEROBIC Blood Culture adequate volume   Culture   Final    NO GROWTH 2 DAYS Performed at  [facility], [place] [address], [place], [place] [address]    Report Status PENDING  Incomplete  Aerobic Culture (superficial specimen)     Status: None (Preliminary result)   Collection Time: 06/19/18  1:04 PM  Result Value Ref Range Status   Specimen Description WOUND  Final   Special Requests PERINEAL  Final   Gram Stain   Final    NO WBC SEEN FEW YEAST Performed at [facility], [place] [address] [place], [place] [address]    Culture PENDING  Incomplete   Report Status PENDING  Incomplete    ASSESSMENT: MSSA bacteremia - MSSA in back aspirate as well. TTE is clear and without any concern for vegetation or structural/valve abnormality. She is tolerating cefazolin well. OK to place PICC line Saturday for long term treatment  Discitis/Osteomyelitis with paraspinal abscess - Will need 6-8 weeks of IV therapy given abscess that was likely partially drained with aspirate. OPAT orders written below. Discussed PICC line briefly and care/maintenane and routine monitoring of line. She lives with her husband  and he is able to help with her care. Evaluation for CIR presently - she likely needs some rehab as she is unable to do much for herself presently.   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 Currently improved on antibiotics.   Cough - reports this to have been ongoing for 1 month but more rhonchus today and diminished over left lung base. X-ray has been ordered.   PLAN: 1. Continue cefazolin  2. PICC line OK Saturday if no growth from recent blood cultures  3. Disposition uncertain at this time - pending CIR eval/approval.   OPAT ORDERS:  Diagnosis: Discitis/Osteomyelitis and Bacteremia   Culture Result: MSSA   Allergies  Allergen Reactions  . Sulfa Antibiotics Rash    Discharge antibiotics: Cefazolin 2 gm IV q8h   Duration: 8 weeks  End Date: March 25th   [place] and Maintenance Per Protocol _x_ Please pull PIC at  completion of IV antibiotics __ Please leave PIC in place until doctor has seen patient or been notified  Labs weekly while on IV antibiotics: _x_ CBC with differential _x_ BMP _x_ CRP _x_ ESR  Fax weekly labs to [phone]  Clinic Follow Up Appt: July 15, 2018 @ 2:30 pm with Dr. Linus Salmons.   Janene Madeira, MSN, NP-C [facility] for Infectious [place] Cell: [phone] Pager: [phone]  06/20/2018  10:39 AM

## 2018-06-20 NOTE — Progress Notes (Signed)
PHARMACY CONSULT NOTE FOR:  OUTPATIENT  PARENTERAL ANTIBIOTIC THERAPY (OPAT)  Indication: MSSA osteomyelitis/discitis Regimen: Cefazolin 2g IV q8h End date: 08/13/2018  IV antibiotic discharge orders are pended. To discharging provider:  please sign these orders via discharge navigator,  Select New Orders & click on the button choice - Manage This Unsigned Work.   Thank you for allowing pharmacy to be a part of this patient's care.  Mila Merry Gerarda Fraction, PharmD, [place] PGY2 Infectious Diseases Pharmacy Resident Phone: [phone] 06/20/2018, 11:07 AM

## 2018-06-20 NOTE — Consult Note (Signed)
Physical Medicine and Rehabilitation Consult Reason for Consult:  Decreased functional mobility Referring Physician:  [facility]   HPI: Kaitlin Vargas is a 68 y.o.right handed female with history of hypertension, hyperlipidemia, B12 deficiency,diabetes mellitus. Patient with reported back pain 4 weeks. Per chart review patient lives with spouse. One level home with ramped entrance. Independent with assistive device prior latest admission.She recently came to the hospital with cough was diagnosed with adenovirus in mid January and was discharged to home. She continued to have back pain radiating to the lower extremities as well as bouts of urinary incontinence and stool incontinence. Presented [facility] hospital 06/16/2018 with increasing back pain and a CT scan of the spine show what appeared to be osteomyelitis. She was transferred to [facility] interventional radiology for lumbar puncture and during procedure noted increase active bleeding and cardiac arrest requiring CPR 90 seconds. She did not require intubation. 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 Patient also with right foot ulceration. MRI of the foot negative for acute bony or joint abnormality. Small volume of fluid in the subcutaneous tissues deep to the first TMT joint. Infectious disease consulted patient with GPC's drawing from lumbar aspirate. Placed on antibiotic therapy. TEE negative for vegetation. Plan is currently to continue cefazolin 16-8 weeks. Therapy evaluations completed with recommendations of physical medicine rehabilitation consult.   Review of Systems  Constitutional: Negative for chills and fever.  HENT: Negative for hearing loss.   Eyes: Negative for blurred vision and double vision.  Respiratory: Positive for cough and shortness of  breath.   Cardiovascular: Positive for leg swelling. Negative for chest pain and palpitations.  Gastrointestinal: Positive for constipation. Negative for nausea and vomiting.  Genitourinary: Negative for dysuria, flank pain and hematuria.       Bladder incontinence  Musculoskeletal: Positive for back pain, joint pain and neck pain.  Skin: Negative for rash.  All other systems reviewed and are negative.  Past Medical History:  Diagnosis Date  . Anemia   . Diabetes mellitus without complication ([place])   . Hyperlipidemia   . Hypertension   . Peripheral neuropathy    Past Surgical History:  Procedure Laterality Date  . ABDOMINAL HYSTERECTOMY    . IR LUMBAR DISC ASPIRATION W/IMG GUIDE  06/18/2018   Family History  Problem Relation Age of Onset  . Cancer Mother   . Stroke Mother   . Cancer Father   . Leukemia Father    Social History:  reports that she has never smoked. She has never used smokeless tobacco. She reports that she does not drink alcohol or use drugs. Allergies:  Allergies  Allergen Reactions  . Sulfa Antibiotics Rash   Medications Prior to Admission  Medication Sig Dispense Refill  . albuterol (PROVENTIL HFA;VENTOLIN HFA) 108 (90 Base) MCG/ACT inhaler Inhale 2 puffs into the lungs every 6 (six) hours as needed for wheezing or shortness of breath. 1 Inhaler 2  . aspirin 81 MG chewable tablet Chew 81 mg by mouth every morning.     . [EXPIRED] dextromethorphan-guaiFENesin (MUCINEX DM) 30-600 MG 12hr tablet Take 1 tablet by mouth 2 (two) times daily for 10 days. 20 tablet 0  . glipiZIDE (GLUCOTROL XL) 10 MG 24 hr tablet Take 10 mg by mouth 2 (two) times daily.     Marland Kitchen lisinopril (PRINIVIL,ZESTRIL) 20 MG tablet Take 10  mg by mouth daily.     . metFORMIN (GLUCOPHAGE) 1000 MG tablet Take 1,000 mg by mouth 2 (two) times daily with a meal.     . metoprolol tartrate (LOPRESSOR) 25 MG tablet Take 25 mg by mouth daily.     . Multiple Vitamin (THERA) TABS Take 1 tablet by mouth  daily.     . pantoprazole (PROTONIX) 40 MG tablet Take 1 tablet (40 mg total) by mouth daily. 30 tablet 1  . pioglitazone (ACTOS) 30 MG tablet Take 30 mg by mouth daily.     . vitamin B-12 (CYANOCOBALAMIN) 1000 MCG tablet Take 1 tablet (1,000 mcg total) by mouth daily for 30 days. 30 tablet 0    Home: Home Living Family/patient expects to be discharged to:: Private residence Living Arrangements: Spouse/significant other Available Help at Discharge: Family, Available 24 hours/day Type of Home: House Home Access: Ramped entrance Home Layout: One level Bathroom Shower/Tub: Chiropodist: Standard Home Equipment: Environmental consultant - 2 wheels, Cane - single point Additional Comments: able to ambualte with RW and SPC prior to admission   Functional History: Prior Function Level of Independence: Independent with assistive device(s) Functional Status:  Mobility: Bed Mobility Overal bed mobility: Needs Assistance Bed Mobility: Rolling, Sidelying to Sit Rolling: Mod assist Sidelying to sit: Mod assist General bed mobility comments: ModA to roll with good form and coordination, ModA to power up trunk to sitting upright at EOB, once up able to maintain balance with S  Transfers Overall transfer level: Needs assistance Equipment used: Rolling walker (2 wheeled) Transfers: Sit to/from Stand, Stand Pivot Transfers Sit to Stand: Max assist Stand pivot transfers: Min assist General transfer comment: MaxA with RW, cues for safety and sequencing; once on feet able to take pivotal steps with MinA for balance with RW  Ambulation/Gait General Gait Details: deferred, will require +2 for safety and incontinent with standing     ADL:    Cognition: Cognition Overall Cognitive Status: Within Functional Limits for tasks assessed Orientation Level: Oriented X4 Cognition Arousal/Alertness: Awake/alert Behavior During Therapy: WFL for tasks assessed/performed Overall Cognitive Status:  Within Functional Limits for tasks assessed  Blood pressure (!) 177/89, pulse (!) 104, temperature 98.2 F (36.8 C), temperature source Oral, resp. rate 20, height 4\' 10"  (1.473 m), weight 76.2 kg, SpO2 96 %. Physical Exam  Constitutional: She appears distressed.  Frail appearing  HENT:  Head: Normocephalic and atraumatic.  Eyes: Pupils are equal, round, and reactive to light.  Neck: No tracheal deviation present.  Cardiovascular: Normal rate.  Respiratory: Effort normal.  GI: Soft.  Musculoskeletal:        General: Tenderness and edema present.     Comments: Groaning frequently during spasms of back  Neurological:  Patient is alert and follow simple commands. Oriented to person, place and time. Can move all 4 but is limited by pain  Skin:  Right foot ulceration is dry.  Psychiatric:  flat    Results for orders placed or performed during the hospital encounter of 06/16/18 (from the past 24 hour(s))  Glucose, capillary     Status: Abnormal   Collection Time: 06/19/18  7:16 AM  Result Value Ref Range   Glucose-Capillary 180 (H) 70 - 99 mg/dL   Comment 1 Notify RN    Comment 2 Document in Chart   Glucose, capillary     Status: Abnormal   Collection Time: 06/19/18 11:18 AM  Result Value Ref Range   Glucose-Capillary 207 (H) 70 - 99 mg/dL  Comment 1 Notify RN    Comment 2 Document in Chart   Aerobic Culture (superficial specimen)     Status: None (Preliminary result)   Collection Time: 06/19/18  1:04 PM  Result Value Ref Range   Specimen Description WOUND    Special Requests PERINEAL    Gram Stain      NO WBC SEEN FEW YEAST Performed at [facility], [place] [address] [place], [place] [address]    Culture PENDING    Report Status PENDING   Glucose, capillary     Status: Abnormal   Collection Time: 06/19/18  4:58 PM  Result Value Ref Range   Glucose-Capillary 303 (H) 70 - 99 mg/dL   Comment 1 Notify RN   Glucose, capillary     Status: Abnormal   Collection  Time: 06/19/18  9:07 PM  Result Value Ref Range   Glucose-Capillary 220 (H) 70 - 99 mg/dL   Comment 1 Notify RN    Comment 2 Document in Chart    Mr Foot Right Wo Contrast  Result Date: 06/18/2018 CLINICAL DATA:  Right forefoot pain and swelling in a diabetic patient for 6 weeks. No known injury. EXAM: MRI OF THE RIGHT FOREFOOT WITHOUT CONTRAST TECHNIQUE: Multiplanar, multisequence MR imaging of the right forefoot was performed. No intravenous contrast was administered. COMPARISON:  Plain films right foot 06/16/2018 FINDINGS: Bones/Joint/Cartilage No acute bony or joint abnormality is identified. There is no bone marrow signal change to suggest osteomyelitis. Severe degenerative disease about the tarsometatarsal joints is worst at the first TMT joint. Ligaments Intact. Muscles and Tendons Intact. No intramuscular fluid collection. Fatty atrophy of intrinsic musculature the foot is noted. Soft tissues Small volume of fluid subjacent to the first tarsometatarsal joint measures 1 cm transverse by 1.2 cm craniocaudal by 1.1 cm long and likely represents an adventitial bursa. IMPRESSION: Negative for acute bony or joint abnormality. Advanced osteoarthritis about the tarsometatarsal joints appears worst at the first TMT joint. Small volume of fluid in the subcutaneous tissues deep to the first TMT joint likely represents adventitial bursa formation. Abscess is possible but thought less likely. Electronically Signed   By: Inge Rise M.D.   On: 06/18/2018 10:24   Ir Lumbar Disc Aspiration W/img Guide  Result Date: 06/18/2018 INDICATION: Diabetes, bacteremia, low back pain, suspected discitis L1-2 on recent MR. EXAM: LUMBAR L1-2 DISC ASPIRATION UNDER FLUOROSCOPY MEDICATIONS: Lidocaine 1% subcutaneous ANESTHESIA/SEDATION: Intravenous Fentanyl and Versed were administered as conscious sedation during continuous monitoring of the patient's level of consciousness and physiological / cardiorespiratory status by  the radiology RN, with a total moderate sedation time of 15 minutes. PROCEDURE: Informed written consent was obtained from the patient after a thorough discussion of the procedural risks, benefits and alternatives. All questions were addressed. Maximal Sterile Barrier Technique was utilized including caps, mask, sterile gowns, sterile gloves, sterile drape, hand hygiene and skin antiseptic. A timeout was performed prior to the initiation of the procedure. Patient placed prone. The appropriate interspace was identified under fluoroscopy, corresponding to previous cross-sectional imaging. An appropriate skin entry site was determined. After local infiltration with 1% lidocaine, a 17 gauge trocar needle was advanced into the lumbar L1-2 interspace from left posterolateral extraforaminal approach. Needle tip position within the interspace confirmed on biplane images. Approximately 2 mL of thin purulent-appearing material were aspirated, sent for the requested laboratory studies. Towards the end of the case, accurate O2 sats were not being displayed. Bradycardia was identified. The patient was urgently turned supine onto  the stretcher. Apnea was noted. Resuscitation efforts were initiated, including Narcan, with return of spontaneous breathing, good circulation, patient able to follow commands, pain-free. Patient was transferred to the ICU for observation. FLUOROSCOPY TIME:  54 seconds; 57 mGy COMPLICATIONS: Transient apnea and bradycardia, presumably sedation related. SIR level B: Nominal therapy (including overnight admission for observation), no consequence. IMPRESSION: Technically successful lumbar L1-2 disc aspiration under fluoroscopy. Electronically Signed   By: Lucrezia Europe M.D.   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 Does the need for close, 24 hr/day medical supervision in concert with the patient's rehab needs make it unreasonable for this patient  to be served in a less intensive setting? Yes and Potentially 2. Co-Morbidities requiring supervision/potential complications: pain control, wound care, ID considerations, nutrition 3. Due to bladder management, bowel management, safety, skin/wound care, disease management, medication administration, pain management and patient education, does the patient require 24 hr/day rehab nursing? Yes 4. Does the patient require coordinated care of a physician, rehab nurse, PT (1-2 hrs/day, 5 days/week) and OT (1-2 hrs/day, 5 days/week) to address physical and functional deficits in the context of the above medical diagnosis(es)? Yes Addressing deficits in the following areas: balance, endurance, locomotion, strength, transferring, bowel/bladder control, bathing, dressing, feeding, grooming, toileting and psychosocial support 5. Can the patient actively participate in an intensive therapy program of at least 3 hrs of therapy per day at least 5 days per week? Potentially 6. The potential for patient to make measurable gains while on inpatient rehab is good and fair 7. Anticipated functional outcomes upon discharge from inpatient rehab are supervision  with PT, supervision and min assist with OT, n/a with SLP. 8. Estimated rehab length of stay to reach the above functional goals is: 12-20 days potentially 9. Anticipated D/C setting: Home 10. Anticipated post D/C treatments: HH therapy and Outpatient therapy 11. Overall Rehab/Functional Prognosis: good  RECOMMENDATIONS: This patient's condition is appropriate for continued rehabilitative care in the following setting: see below Patient has agreed to participate in recommended program. Yes Note that insurance prior authorization may be required for reimbursement for recommended care.  Comment: Pt's pain is limiting at this point. Having significant spasms this morning. I ordered scheduled robaxin to help.  Will need to see improved activity tolerance to consider  inpatient rehab. Rehab Admissions Coordinator to follow up.  Thanks,  Meredith Staggers, MD, Mellody Drown  I have personally performed a face to face diagnostic evaluation of this patient. Additionally, I have reviewed and concur with the physician assistant's documentation above.   Lavon Paganini Angiulli, PA-C 06/20/2018

## 2018-06-20 NOTE — Progress Notes (Signed)
Was able to educate pt on repositioning and pt let me turn her

## 2018-06-20 NOTE — Care Management Important Message (Signed)
Important Message  Patient Details  Name: Kaitlin Vargas MRN: [id number] Date of Birth: [birth year]   Medicare Important Message Given:  Yes    Barb Merino Cyntia Staley 06/20/2018, 2:54 PM

## 2018-06-21 ENCOUNTER — Inpatient Hospital Stay: Payer: Self-pay

## 2018-06-21 ENCOUNTER — Inpatient Hospital Stay (HOSPITAL_COMMUNITY): Payer: Medicare HMO

## 2018-06-21 LAB — GLUCOSE, CAPILLARY
Glucose-Capillary: 270 mg/dL — ABNORMAL HIGH (ref 70–99)
Glucose-Capillary: 231 mg/dL — ABNORMAL HIGH (ref 70–99)
Glucose-Capillary: 143 mg/dL — ABNORMAL HIGH (ref 70–99)
Glucose-Capillary: 156 mg/dL — ABNORMAL HIGH (ref 70–99)

## 2018-06-21 LAB — BASIC METABOLIC PANEL
Anion gap: 13 (ref 5–15)
BUN: 12 mg/dL (ref 8–23)
CO2: 25 mmol/L (ref 22–32)
CREATININE: 0.66 mg/dL (ref 0.44–1.00)
Calcium: 8.1 mg/dL — ABNORMAL LOW (ref 8.9–10.3)
Chloride: 91 mmol/L — ABNORMAL LOW (ref 98–111)
GFR calc Af Amer: >60 mL/min (ref >60)
GFR calc non Af Amer: >60 mL/min (ref >60)
Glucose, Bld: 266 mg/dL — ABNORMAL HIGH (ref 70–99)
Potassium: 3.2 mmol/L — ABNORMAL LOW (ref 3.5–5.1)
Sodium: 129 mmol/L — ABNORMAL LOW (ref 135–145)

## 2018-06-21 MED ORDER — SODIUM CHLORIDE 0.9% FLUSH
10.0000 mL | INTRAVENOUS | Status: DC | PRN
  Administered 2018-06-26: 20 mL
  Filled 2018-06-21: qty 40

## 2018-06-21 MED ORDER — POTASSIUM CHLORIDE CRYS ER 20 MEQ PO TBCR
40.0000 meq | EXTENDED_RELEASE_TABLET | Freq: Once | ORAL | Status: AC
  Administered 2018-06-21: 40 meq via ORAL
  Filled 2018-06-21: qty 2

## 2018-06-21 NOTE — Progress Notes (Signed)
Multiple attempts made to contact patient's nurse to schedule PICC placement. Will continue to attempt contact.

## 2018-06-21 NOTE — Progress Notes (Signed)
patinet is experiening cough congestion and crackles, vI have bed in a chair postion PRN Breathing treatment given with plan to start flutter valve and Iv lasix per MD.

## 2018-06-21 NOTE — Progress Notes (Signed)
PROGRESS NOTE    Kaitlin Vargas  BBC:[id number] DOB: 1951/05/11 DOA: 06/16/2018 PCP: [name], MD   Brief Narrative: Patient is a 68 year old female who was admitted initially at [facility]  for back pain.  She was discharged from there on 06/10/2019 when she was treated for acute hypoxemic respiratory failure secondary to acute bronchitis.  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  She was also found to have a ulcer on her right foot.  She was transferred to [facility] for IR guided drainage of the abscess..  During the procedure ,she received fentanyl and Versed but she underwent cardiac arrest.  She required CPR of 90 seconds with return of spontaneous circulation.  Did not require intubation.  She was admitted to pulmonary critical care service.  She was transferred to hospitalist service on 06/20/18. Her blood cultures also showed MSSA.  Currently on cefazolin.  No evidence of endocarditis.  ID recommended antibiotics per 6-8 weeks.  Plan for PICC line today.  Planning for discharge to CIR. She  is volume overloaded so started on IV Lasix.  Assessment & Plan:   Principal Problem:   MSSA bacteremia Active Problems:   Essential hypertension   Type 2 diabetes mellitus with hyperlipidemia (HCC)   Diskitis   DKA (diabetic ketoacidosis) ([place])   Diabetic foot ulcer ([place])   Cardiac arrest, cause unspecified ([place])   Acute encephalopathy  MSSA bacteremia: ID recommending cefazolin for total of 8 weeks.  Plan for PICC line today. Blood cultures sent on 1/29 remain negative.  End date of antibiotic will be on March 25. She was found to have osteomyelitis/discitis of the lumbar spine.  Disc aspiration showed gram-positive cocci on stain.  Urine culture also showed Staphylococcus aureus.  Echocardiogram did not show any vegetation.  Status post cardiac arrest: While at interventional radiology.  At brief CPR for 90  seconds.  Did not need intubation.  Acute respiratory failure/dyspnea/cough: Chest x-ray  showed pulmonary congestion with findings of CHF.  Echocardiogram done here showed normal left ventricular ejection fraction.  Likely diastolic CHF.  She might have been volume overloaded due to fluid resuscitation after cardiac arrest. We will start her on Lasix 40 mg IV every 12 and see her response. She has severe bilateral lower extremity edema.She was positive net input of 5 L since admission.  Having good diuresis now.  Poorly controlled diabetes mellitus/DKA: She was found to have DKA on admission.  Continue current insulin regimen.  She was on oral medications at home.  Currently she is on long-acting and sliding scale insulin here.  HbA1C of 8.5 as per 1/18.  Right foot wound: Improving with antibiotics.  Likely associated with diabetic ulcer.  MRI of the right foot did not show any osteomyelitis, fracture or dislocation.  He needs to follow-up with podiatry as an outpatient.  Hypertension: Mildly hypertensive.  Continue current medications.Home medications restarted.  Deconditioning/debility: PT evaluated her and recommended CIR on discharge   Nutrition Problem: Increased nutrient needs Etiology: wound healing      DVT prophylaxis: Heparin  code Status: Full Family Communication: None present at the bedside Disposition Plan: CIR   Consultants: ID, PCCM  Procedures: None  Antimicrobials:  Anti-infectives (From admission, onward)   Start     Dose/Rate Route Frequency Ordered Stop   06/17/18 1230  ceFAZolin (ANCEF) IVPB 2g/100 mL premix     2 g 200 mL/hr over 30 Minutes Intravenous Every 8  hours 06/17/18 1131     06/16/18 1345  cefTRIAXone (ROCEPHIN) 1 g in sodium chloride 0.9 % 100 mL IVPB     1 g 200 mL/hr over 30 Minutes Intravenous  Once 06/16/18 1340 06/16/18 1614      Subjective: Seen and examined the bedside this morning.  Hypertensive today.  Looks slightly better than  yesterday.  Respiratory status has improved.  Still has bilateral lower extremity edema.  Still has  bilateral crackles and rhonchi.  Objective: Vitals:   06/20/18 1936 06/21/18 0411 06/21/18 0823 06/21/18 0921  BP: (!) 168/83 (!) 171/99 (!) 162/77   Pulse: (!) 106 (!) 114 (!) 115   Resp: 20 20  20   Temp: (!) 100.7 F (38.2 C) 98.3 F (36.8 C)    TempSrc: Oral Oral    SpO2: 97% 96% 100%   Weight:  74.1 kg    Height:        Intake/Output Summary (Last 24 hours) at 06/21/2018 1011 Last data filed at 06/21/2018 0933 Gross per 24 hour  Intake 1432.02 ml  Output 2200 ml  Net -767.98 ml   Filed Weights   06/18/18 1230 06/20/18 0520 06/21/18 0411  Weight: 73.1 kg 76.2 kg 74.1 kg    Examination:  General exam: Generalized weakness  HEENT:PERRL,Oral mucosa moist, Ear/Nose normal on gross exam Respiratory system: Bilateral decreased air entry, bilateral rhonchi, basal crackles Cardiovascular system: S1 & S2 heard, RRR. No JVD, murmurs, rubs, gallops or clicks. Gastrointestinal system: Abdomen is nondistended, soft and nontender. No organomegaly or masses felt. Normal bowel sounds heard. Central nervous system: Alert and oriented. No focal neurological deficits. Extremities: Trace edema, no clubbing ,no cyanosis, distal peripheral pulses palpable. Skin: No rashes, lesions or ulcers,no icterus ,no pallor,Healing wound on the right foot   Data Reviewed: I have personally reviewed following labs and imaging studies  CBC: Recent Labs  Lab 06/16/18 1244 06/18/18 0500 06/19/18 0426 06/20/18 0557  WBC 22.6* 12.6* 9.6 10.8*  NEUTROABS 20.4*  --   --   --   HGB 11.6* 9.9* 9.2* 9.9*  HCT 38.3 31.5* 28.9* 29.5*  MCV 107.3* 101.9* 100.0 97.7  PLT 424* 429* 388 097   Basic Metabolic Panel: Recent Labs  Lab 06/17/18 1413 06/18/18 0500 06/19/18 0426 06/20/18 0557 06/21/18 0733  NA 134* 132* 132* 129* 129*  K 3.2* 4.5 3.7 3.4* 3.2*  CL 102 101 100 94* 91*  CO2 25 23 24 26 25     GLUCOSE 93 202* 186* 176* 266*  BUN 20 18 10 10 12   CREATININE 0.61 0.57 0.56 0.51 0.66  CALCIUM 8.3* 8.2* 8.0* 8.2* 8.1*  MG  --  1.4*  --  1.6*  --   PHOS  --   --   --  2.6  --    GFR: Estimated Creatinine Clearance: 56.8 mL/min (by C-G formula based on SCr of 0.66 mg/dL). Liver Function Tests: Recent Labs  Lab 06/16/18 1451  AST 12*  ALT 10  ALKPHOS 103  BILITOT 2.8*  PROT 7.1  ALBUMIN 2.5*   Recent Labs  Lab 06/16/18 1451  LIPASE 17   No results for input(s): AMMONIA in the last 168 hours. Coagulation Profile: Recent Labs  Lab 06/17/18 0853  INR 1.04   Cardiac Enzymes: No results for input(s): CKTOTAL, CKMB, CKMBINDEX, TROPONINI in the last 168 hours. BNP (last 3 results) No results for input(s): PROBNP in the last 8760 hours. HbA1C: Recent Labs    06/20/18 0557  HGBA1C 8.7*  CBG: Recent Labs  Lab 06/20/18 0740 06/20/18 1117 06/20/18 1639 06/20/18 2111 06/21/18 0723  GLUCAP 189* 224* 175* 185* 270*   Lipid Profile: No results for input(s): CHOL, HDL, LDLCALC, TRIG, CHOLHDL, LDLDIRECT in the last 72 hours. Thyroid Function Tests: No results for input(s): TSH, T4TOTAL, FREET4, T3FREE, THYROIDAB in the last 72 hours. Anemia Panel: No results for input(s): VITAMINB12, FOLATE, FERRITIN, TIBC, IRON, RETICCTPCT in the last 72 hours. Sepsis Labs: Recent Labs  Lab 06/16/18 1451 06/16/18 1750  LATICACIDVEN 1.5 1.1    Recent Results (from the past 240 hour(s))  Urine culture     Status: Abnormal   Collection Time: 06/16/18 12:57 PM  Result Value Ref Range Status   Specimen Description   Final    URINE, CLEAN CATCH Performed at [facility], [address], [place], [place] [address]    Special Requests   Final    NONE Performed at [facility], [address], [place], [place] [address]    Culture (A)  Final    >=100,000 COLONIES/mL STAPHYLOCOCCUS AUREUS >=100,000 COLONIES/mL GRANULICATELLA ADIACENS Standardized susceptibility testing for  this organism is not available. Performed at [facility], [place] [address] [place], [place] [address]    Report Status 06/20/2018 FINAL  Final   Organism ID, Bacteria STAPHYLOCOCCUS AUREUS (A)  Final      Susceptibility   Staphylococcus aureus - MIC*    CIPROFLOXACIN <=0.5 SENSITIVE Sensitive     GENTAMICIN <=0.5 SENSITIVE Sensitive     NITROFURANTOIN <=16 SENSITIVE Sensitive     OXACILLIN <=0.25 SENSITIVE Sensitive     TETRACYCLINE <=1 SENSITIVE Sensitive     VANCOMYCIN <=0.5 SENSITIVE Sensitive     TRIMETH/SULFA <=10 SENSITIVE Sensitive     CLINDAMYCIN <=0.25 SENSITIVE Sensitive     RIFAMPIN <=0.5 SENSITIVE Sensitive     Inducible Clindamycin NEGATIVE Sensitive     * >=100,000 COLONIES/mL STAPHYLOCOCCUS AUREUS  Culture, blood (routine x 2)     Status: Abnormal   Collection Time: 06/16/18  1:53 PM  Result Value Ref Range Status   Specimen Description   Final    LEFT ANTECUBITAL Performed at [facility], [address], [place], [place] [address]    Special Requests   Final    BOTTLES DRAWN AEROBIC ONLY Blood Culture results may not be optimal due to an inadequate volume of blood received in culture bottles Performed at [facility], [address], [place], [place] [address]    Culture  Setup Time   Final    AEROBIC BOTTLE ONLY GRAM POSITIVE COCCI Gram Stain Report Called to,Read Back By and Verified With: J HEARN,RN @0338  06/17/18 [facility] Performed at [facility], [address] [place], [place] [address]    Culture (A)  Final    STAPHYLOCOCCUS AUREUS SUSCEPTIBILITIES PERFORMED ON PREVIOUS CULTURE WITHIN THE LAST 5 DAYS. Performed at [facility], [place] [address], [place],  [place] [address]    Report Status 06/19/2018 FINAL  Final  Culture, blood (routine x 2)     Status: Abnormal   Collection Time: 06/16/18  2:51 PM  Result Value Ref Range Status   Specimen Description   Final    BLOOD BLOOD RIGHT WRIST Performed at [facility], [address],  [place], [place] [address]    Special Requests   Final    BOTTLES DRAWN AEROBIC AND ANAEROBIC Blood Culture results may not be optimal due to an inadequate volume of blood received in culture bottles Performed at [facility]  [address] [place], [place] [address]    Culture  Setup Time   Final    IN BOTH AEROBIC AND ANAEROBIC BOTTLES GRAM POSITIVE COCCI Gram Stain Report Called to,Read Back By and Verified With: J HEARN,RN @0338  06/17/18 [facility] CRITICAL RESULT CALLED TO, READ BACK BY AND VERIFIED WITH: PHARMD L POOLE [phone] MLM Performed at [facility], [place] [address], [place], [place] [address]    Culture STAPHYLOCOCCUS AUREUS (A)  Final   Report Status 06/19/2018 FINAL  Final   Organism ID, Bacteria STAPHYLOCOCCUS AUREUS  Final      Susceptibility   Staphylococcus aureus - MIC*    CIPROFLOXACIN <=0.5 SENSITIVE Sensitive     ERYTHROMYCIN >=8 RESISTANT Resistant     GENTAMICIN <=0.5 SENSITIVE Sensitive     OXACILLIN <=0.25 SENSITIVE Sensitive     TETRACYCLINE <=1 SENSITIVE Sensitive     VANCOMYCIN <=0.5 SENSITIVE Sensitive     TRIMETH/SULFA <=10 SENSITIVE Sensitive     CLINDAMYCIN <=0.25 SENSITIVE Sensitive     RIFAMPIN <=0.5 SENSITIVE Sensitive     Inducible Clindamycin NEGATIVE Sensitive     * STAPHYLOCOCCUS AUREUS  Blood Culture ID Panel (Reflexed)     Status: Abnormal   Collection Time: 06/16/18  2:51 PM  Result Value Ref Range Status   Enterococcus species NOT DETECTED NOT DETECTED Final   Listeria monocytogenes NOT DETECTED NOT DETECTED Final   Staphylococcus species DETECTED (A) NOT DETECTED Final    Comment: CRITICAL RESULT CALLED TO, READ BACK BY AND VERIFIED WITH: PHARMD L POOLE [phone] MLM    Staphylococcus aureus (BCID) DETECTED (A) NOT DETECTED Final    Comment: Methicillin (oxacillin) susceptible Staphylococcus aureus (MSSA). Preferred therapy is anti staphylococcal beta lactam antibiotic (Cefazolin or Nafcillin), unless clinically  contraindicated. CRITICAL RESULT CALLED TO, READ BACK BY AND VERIFIED WITH: PHARMD L POOLE [phone] MLM    Methicillin resistance NOT DETECTED NOT DETECTED Final   Streptococcus species NOT DETECTED NOT DETECTED Final   Streptococcus agalactiae NOT DETECTED NOT DETECTED Final   Streptococcus pneumoniae NOT DETECTED NOT DETECTED Final   Streptococcus pyogenes NOT DETECTED NOT DETECTED Final   Acinetobacter baumannii NOT DETECTED NOT DETECTED Final   Enterobacteriaceae species NOT DETECTED NOT DETECTED Final   Enterobacter cloacae complex NOT DETECTED NOT DETECTED Final   Escherichia coli NOT DETECTED NOT DETECTED Final   Klebsiella oxytoca NOT DETECTED NOT DETECTED Final   Klebsiella pneumoniae NOT DETECTED NOT DETECTED Final   Proteus species NOT DETECTED NOT DETECTED Final   Serratia marcescens NOT DETECTED NOT DETECTED Final   Haemophilus influenzae NOT DETECTED NOT DETECTED Final   Neisseria meningitidis NOT DETECTED NOT DETECTED Final   Pseudomonas aeruginosa NOT DETECTED NOT DETECTED Final   Candida albicans NOT DETECTED NOT DETECTED Final   Candida glabrata NOT DETECTED NOT DETECTED Final   Candida krusei NOT DETECTED NOT DETECTED Final   Candida parapsilosis NOT DETECTED NOT DETECTED Final   Candida tropicalis NOT DETECTED NOT DETECTED Final    Comment: Performed at [facility], [place]. [address], [place], [place] [address]  MRSA PCR Screening     Status: None   Collection Time: 06/17/18 12:13 PM  Result Value Ref Range Status   MRSA by PCR NEGATIVE NEGATIVE Final    Comment:        The GeneXpert MRSA Assay (FDA approved for NASAL specimens only), is one component of a comprehensive MRSA colonization surveillance program. It is not intended to diagnose MRSA infection  nor to guide or monitor treatment for MRSA infections. Performed at [facility], [place] [address], [place], [place] [address]   Aerobic/Anaerobic Culture (surgical/deep wound)     Status: None  (Preliminary result)   Collection Time: 06/18/18 12:02 PM  Result Value Ref Range Status   Specimen Description VERTEBRA DISC  Final   Special Requests NONE  Final   Gram Stain   Final    ABUNDANT WBC PRESENT, PREDOMINANTLY PMN ABUNDANT GRAM POSITIVE COCCI IN PAIRS IN CLUSTERS Performed at [facility], [place] [address], [place], [place] [address]    Culture   Final    MODERATE STAPHYLOCOCCUS AUREUS NO ANAEROBES ISOLATED; CULTURE IN PROGRESS FOR 5 DAYS    Report Status PENDING  Incomplete   Organism ID, Bacteria STAPHYLOCOCCUS AUREUS  Final      Susceptibility   Staphylococcus aureus - MIC*    CIPROFLOXACIN <=0.5 SENSITIVE Sensitive     ERYTHROMYCIN >=8 RESISTANT Resistant     GENTAMICIN <=0.5 SENSITIVE Sensitive     OXACILLIN <=0.25 SENSITIVE Sensitive     TETRACYCLINE <=1 SENSITIVE Sensitive     VANCOMYCIN <=0.5 SENSITIVE Sensitive     TRIMETH/SULFA <=10 SENSITIVE Sensitive     CLINDAMYCIN <=0.25 SENSITIVE Sensitive     RIFAMPIN <=0.5 SENSITIVE Sensitive     Inducible Clindamycin NEGATIVE Sensitive     * MODERATE STAPHYLOCOCCUS AUREUS  Culture, blood (routine x 2)     Status: None (Preliminary result)   Collection Time: 06/18/18  4:15 PM  Result Value Ref Range Status   Specimen Description BLOOD LEFT ARM  Final   Special Requests   Final    BOTTLES DRAWN AEROBIC ONLY Blood Culture adequate volume   Culture NO GROWTH 3 DAYS  Final   Report Status PENDING  Incomplete  Culture, blood (routine x 2)     Status: None (Preliminary result)   Collection Time: 06/18/18  4:49 PM  Result Value Ref Range Status   Specimen Description BLOOD LEFT ANTECUBITAL  Final   Special Requests   Final    BOTTLES DRAWN AEROBIC AND ANAEROBIC Blood Culture adequate volume   Culture NO GROWTH 3 DAYS  Final   Report Status PENDING  Incomplete  Aerobic Culture (superficial specimen)     Status: None (Preliminary result)   Collection Time: 06/19/18  1:04 PM  Result Value Ref Range Status    Specimen Description WOUND  Final   Special Requests PERINEAL  Final   Gram Stain NO WBC SEEN FEW YEAST   Final   Culture   Final    CULTURE REINCUBATED FOR BETTER GROWTH Performed at [facility], [address], [place], [place] [address]    Report Status PENDING  Incomplete         Radiology Studies: Dg Chest Port 1 View  Result Date: 06/20/2018 CLINICAL DATA:  Shortness of breath. EXAM: PORTABLE CHEST 1 VIEW COMPARISON:  Chest x-ray 06/07/2018. FINDINGS: Patient is rotated to the right. Mediastinal widening noted., this is most likely vascular related to AP portable technique. Cardiomegaly with bilateral interstitial prominence and small bilateral pleural effusions. Findings suggest CHF. Bilateral mid lung field subsegmental atelectasis. No pneumothorax. IMPRESSION: Findings consistent with congestive heart failure bilateral interstitial edema bilateral pleural effusions. Pneumonitis can not be excluded. Electronically Signed   By: Marcello Moores  Register   On: 06/20/2018 11:37   [place] Ekg Site Rite  Result Date: 06/21/2018 If Site Rite image not attached, placement could not be confirmed due  to current cardiac rhythm.       Scheduled Meds: . aspirin  81 mg Oral Daily  . feeding supplement (GLUCERNA SHAKE)  237 mL Oral BID BM  . feeding supplement (PRO-STAT SUGAR FREE 64)  30 mL Oral BID  . furosemide  40 mg Intravenous Q12H  . heparin injection (subcutaneous)  5,000 Units Subcutaneous Q8H  . insulin aspart  0-15 Units Subcutaneous TID WC  . insulin aspart  0-5 Units Subcutaneous QHS  . insulin glargine  15 Units Subcutaneous Daily  . lisinopril  10 mg Oral Daily  . methocarbamol  500 mg Oral Q6H  . metoprolol tartrate  25 mg Oral BID  . pantoprazole  40 mg Oral Daily  . potassium chloride  40 mEq Oral Once  . vitamin B-12  1,000 mcg Oral Daily   Continuous Infusions: . sodium chloride 250 mL (06/20/18 0610)  .  ceFAZolin (ANCEF) IV 2 g (06/21/18 0631)     LOS: 5  days    Time spent: 35 mins.More than 50% of that time was spent in counseling and/or coordination of care.      Shelly Coss, MD [facility] Pager [phone]  If 7PM-7AM, please contact night-coverage [email or web address] Password [email or web address] 06/21/2018, 10:11 AM

## 2018-06-21 NOTE — Plan of Care (Signed)
  Problem: Safety: Goal: Ability to remain free from injury will improve Outcome: Progressing   Problem: Activity: Goal: Risk for activity intolerance will decrease Outcome: Not Progressing   Problem: Pain Managment: Goal: General experience of comfort will improve Outcome: Not Progressing   

## 2018-06-21 NOTE — Progress Notes (Signed)
Peripherally Inserted Central Catheter/Midline Placement  The IV Nurse has discussed with the patient and/or persons authorized to consent for the patient, the purpose of this procedure and the potential benefits and risks involved with this procedure.  The benefits include less needle sticks, lab draws from the catheter, and the patient may be discharged home with the catheter. Risks include, but not limited to, infection, bleeding, blood clot (thrombus formation), and puncture of an artery; nerve damage and irregular heartbeat and possibility to perform a PICC exchange if needed/ordered by physician.  Alternatives to this procedure were also discussed.  Bard Power PICC patient education guide, fact sheet on infection prevention and patient information card has been provided to patient /or left at bedside. Tip confirmed via ECG, but confirmation picture didn't transverse to chart. PCXR to confirm tip placement prior to use.   PICC/Midline Placement Documentation  PICC Single Lumen 06/21/18 PICC Right Brachial 35 cm 1 cm (Active)  Indication for Insertion or Continuance of Line Home intravenous therapies (PICC only) 06/21/2018  7:00 PM  Exposed Catheter (cm) 0 cm 06/21/2018  7:00 PM  Site Assessment Clean;Dry;Intact 06/21/2018  7:00 PM  Line Status Flushed;Blood return noted;Saline locked 06/21/2018  7:00 PM  Dressing Type Transparent 06/21/2018  7:00 PM  Dressing Status Clean;Dry;Intact;Antimicrobial disc in place 06/21/2018  7:00 PM  Line Care Connections checked and tightened 06/21/2018  7:00 PM  Dressing Change Due 06/28/18 06/21/2018  7:00 PM       Kaitlin Vargas 06/21/2018, 7:28 PM

## 2018-06-22 LAB — BASIC METABOLIC PANEL
Anion gap: 13 (ref 5–15)
BUN: 13 mg/dL (ref 8–23)
CO2: 25 mmol/L (ref 22–32)
Calcium: 7.9 mg/dL — ABNORMAL LOW (ref 8.9–10.3)
Chloride: 91 mmol/L — ABNORMAL LOW (ref 98–111)
Creatinine, Ser: 0.7 mg/dL (ref 0.44–1.00)
GFR calc non Af Amer: >60 mL/min (ref >60)
Glucose, Bld: 239 mg/dL — ABNORMAL HIGH (ref 70–99)
Potassium: 3.6 mmol/L (ref 3.5–5.1)
Sodium: 129 mmol/L — ABNORMAL LOW (ref 135–145)

## 2018-06-22 LAB — GLUCOSE, CAPILLARY
GLUCOSE-CAPILLARY: 247 mg/dL — AB (ref 70–99)
GLUCOSE-CAPILLARY: 133 mg/dL — AB (ref 70–99)
Glucose-Capillary: 246 mg/dL — ABNORMAL HIGH (ref 70–99)
Glucose-Capillary: 191 mg/dL — ABNORMAL HIGH (ref 70–99)

## 2018-06-22 LAB — MAGNESIUM: Magnesium: 1.4 mg/dL — ABNORMAL LOW (ref 1.7–2.4)

## 2018-06-22 MED ORDER — INSULIN GLARGINE 100 UNIT/ML ~~LOC~~ SOLN
25.0000 [IU] | Freq: Every day | SUBCUTANEOUS | Status: DC
  Administered 2018-06-23 – 2018-06-26 (×4): 25 [IU] via SUBCUTANEOUS
  Filled 2018-06-22 (×4): qty 0.25

## 2018-06-22 MED ORDER — FUROSEMIDE 10 MG/ML IJ SOLN
40.0000 mg | Freq: Every day | INTRAMUSCULAR | Status: DC
  Administered 2018-06-23: 40 mg via INTRAVENOUS
  Filled 2018-06-22 (×2): qty 4

## 2018-06-22 MED ORDER — MAGNESIUM SULFATE 2 GM/50ML IV SOLN
2.0000 g | Freq: Once | INTRAVENOUS | Status: AC
  Administered 2018-06-22: 2 g via INTRAVENOUS
  Filled 2018-06-22: qty 50

## 2018-06-22 NOTE — Progress Notes (Signed)
PROGRESS NOTE    Kaitlin Vargas  FYB:[id number] DOB: 10-Aug-1950 DOA: 06/16/2018 PCP: [name], MD   Brief Narrative: Patient is a 68 year old female who was admitted initially at [facility]  for back pain.  She was discharged from there on 06/10/2019 when she was treated for acute hypoxemic respiratory failure secondary to acute bronchitis.  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  She was also found to have a ulcer on her right foot.  She was transferred to [facility] for IR guided drainage of the abscess..  During the procedure ,she received fentanyl and Versed but she underwent cardiac arrest.  She required CPR of 90 seconds with return of spontaneous circulation.  Did not require intubation.  She was admitted to pulmonary critical care service.  She was transferred to hospitalist service on 06/20/18. Her blood cultures also showed MSSA.  Currently on cefazolin.  No evidence of endocarditis.  ID recommended antibiotics per 6-8 weeks.  Plan for PICC line today.  Planning for discharge to CIR. She  is volume overloaded so started on IV Lasix. She is medically stable for discharge to inpatient rehab as soon as the bed is available.  Assessment & Plan:   Principal Problem:   MSSA bacteremia Active Problems:   Essential hypertension   Type 2 diabetes mellitus with hyperlipidemia (HCC)   Diskitis   DKA (diabetic ketoacidosis) ([place])   Diabetic foot ulcer ([place])   Cardiac arrest, cause unspecified ([place])   Acute encephalopathy  MSSA bacteremia: ID recommending cefazolin for total of 8 weeks. PlCC placed. Blood cultures sent on 1/29 remain negative.  End date of antibiotic will be on March 25. She was found to have osteomyelitis/discitis of the lumbar spine.  Disc aspiration showed gram-positive cocci on stain.  Urine culture also showed Staphylococcus aureus.  Echocardiogram did not show any vegetation.  Status  post cardiac arrest: While at interventional radiology.  At brief CPR for 90 seconds.  Did not need intubation.  Acute respiratory failure/dyspnea/cough: Chest x-ray  showed pulmonary congestion with findings of CHF.  Echocardiogram done here showed normal left ventricular ejection fraction.  Likely diastolic CHF.  She might have been volume overloaded due to fluid resuscitation after cardiac arrest. She was started  Lasix 40 mg IV every 12 with good response. Improving  bilateral lower extremity edema.Having good diuresis now.  Respiratory status stable.  Poorly controlled diabetes mellitus/DKA: She was found to have DKA on admission.  Continue current insulin regimen.  She was on oral medications at home.  Currently she is on long-acting and sliding scale insulin here.  HbA1C of 8.5 as per 1/18.  Right foot wound: Improving with antibiotics.  Likely associated with diabetic ulcer.  MRI of the right foot did not show any osteomyelitis, fracture or dislocation.  He needs to follow-up with podiatry as an outpatient.  Hypertension: Mildly hypertensive.  Continue current medications.Home medications restarted.  Deconditioning/debility: PT evaluated her and recommended CIR on discharge   Nutrition Problem: Increased nutrient needs Etiology: wound healing      DVT prophylaxis: Heparin  code Status: Full Family Communication: None present at the bedside Disposition Plan: CIR   Consultants: ID, PCCM  Procedures: None  Antimicrobials:  Anti-infectives (From admission, onward)   Start     Dose/Rate Route Frequency Ordered Stop   06/17/18 1230  ceFAZolin (ANCEF) IVPB 2g/100 mL premix     2 g 200 mL/hr over 30 Minutes Intravenous  Every 8 hours 06/17/18 1131     06/16/18 1345  cefTRIAXone (ROCEPHIN) 1 g in sodium chloride 0.9 % 100 mL IVPB     1 g 200 mL/hr over 30 Minutes Intravenous  Once 06/16/18 1340 06/16/18 1614      Subjective: Patient seen and examined the bedside this  morning.  Appears more comfortable today.  Respiratory status is improved.  Bilateral lower extremity edema has improved.  Objective: Vitals:   06/22/18 0500 06/22/18 0721 06/22/18 0844 06/22/18 0900  BP: (!) 157/95  (!) 154/89   Pulse: (!) 115 (!) 106 (!) 110   Resp: 20  16   Temp: 98.9 F (37.2 C)  98.6 F (37 C)   TempSrc: Oral  Oral   SpO2: 97%  96% 96%  Weight: 72.7 kg     Height:        Intake/Output Summary (Last 24 hours) at 06/22/2018 1005 Last data filed at 06/22/2018 0900 Gross per 24 hour  Intake 1600 ml  Output 1125 ml  Net 475 ml   Filed Weights   06/20/18 0520 06/21/18 0411 06/22/18 0500  Weight: 76.2 kg 74.1 kg 72.7 kg    Examination:  General exam: Generalized weakness HEENT:PERRL,Oral mucosa moist, Ear/Nose normal on gross exam Respiratory system: Bilateral decreased air entry on the bases Cardiovascular system: S1 & S2 heard, RRR. No JVD, murmurs, rubs, gallops or clicks. Gastrointestinal system: Abdomen is nondistended, soft and nontender. No organomegaly or masses felt. Normal bowel sounds heard. Central nervous system: Alert and oriented. No focal neurological deficits. Extremities: Trace edema, no clubbing ,no cyanosis, distal peripheral pulses palpable. Skin: No rashes, lesions or ulcers,no icterus ,no pallor MSK: Normal muscle bulk,tone ,power Psychiatry: Judgement and insight appear normal. Mood & affect appropriate.    Data Reviewed: I have personally reviewed following labs and imaging studies  CBC: Recent Labs  Lab 06/16/18 1244 06/18/18 0500 06/19/18 0426 06/20/18 0557  WBC 22.6* 12.6* 9.6 10.8*  NEUTROABS 20.4*  --   --   --   HGB 11.6* 9.9* 9.2* 9.9*  HCT 38.3 31.5* 28.9* 29.5*  MCV 107.3* 101.9* 100.0 97.7  PLT 424* 429* 388 295   Basic Metabolic Panel: Recent Labs  Lab 06/18/18 0500 06/19/18 0426 06/20/18 0557 06/21/18 0733 06/22/18 0600  NA 132* 132* 129* 129* 129*  K 4.5 3.7 3.4* 3.2* 3.6  CL 101 100 94* 91* 91*    CO2 23 24 26 25 25   GLUCOSE 202* 186* 176* 266* 239*  BUN 18 10 10 12 13   CREATININE 0.57 0.56 0.51 0.66 0.70  CALCIUM 8.2* 8.0* 8.2* 8.1* 7.9*  MG 1.4*  --  1.6*  --  1.4*  PHOS  --   --  2.6  --   --    GFR: Estimated Creatinine Clearance: 56.2 mL/min (by C-G formula based on SCr of 0.7 mg/dL). Liver Function Tests: Recent Labs  Lab 06/16/18 1451  AST 12*  ALT 10  ALKPHOS 103  BILITOT 2.8*  PROT 7.1  ALBUMIN 2.5*   Recent Labs  Lab 06/16/18 1451  LIPASE 17   No results for input(s): AMMONIA in the last 168 hours. Coagulation Profile: Recent Labs  Lab 06/17/18 0853  INR 1.04   Cardiac Enzymes: No results for input(s): CKTOTAL, CKMB, CKMBINDEX, TROPONINI in the last 168 hours. BNP (last 3 results) No results for input(s): PROBNP in the last 8760 hours. HbA1C: Recent Labs    06/20/18 0557  HGBA1C 8.7*   CBG: Recent Labs  Lab 06/21/18 0723 06/21/18 1131 06/21/18 1617 06/21/18 2124 06/22/18 0726  GLUCAP 270* 231* 143* 156* 247*   Lipid Profile: No results for input(s): CHOL, HDL, LDLCALC, TRIG, CHOLHDL, LDLDIRECT in the last 72 hours. Thyroid Function Tests: No results for input(s): TSH, T4TOTAL, FREET4, T3FREE, THYROIDAB in the last 72 hours. Anemia Panel: No results for input(s): VITAMINB12, FOLATE, FERRITIN, TIBC, IRON, RETICCTPCT in the last 72 hours. Sepsis Labs: Recent Labs  Lab 06/16/18 1451 06/16/18 1750  LATICACIDVEN 1.5 1.1    Recent Results (from the past 240 hour(s))  Urine culture     Status: Abnormal   Collection Time: 06/16/18 12:57 PM  Result Value Ref Range Status   Specimen Description   Final    URINE, CLEAN CATCH Performed at [facility], [address], [place], [place] [address]    Special Requests   Final    NONE Performed at [facility], [address], [place], [place] [address]    Culture (A)  Final    >=100,000 COLONIES/mL STAPHYLOCOCCUS AUREUS >=100,000 COLONIES/mL GRANULICATELLA ADIACENS Standardized  susceptibility testing for this organism is not available. Performed at [facility], [place] [address] [place], [place] [address]    Report Status 06/20/2018 FINAL  Final   Organism ID, Bacteria STAPHYLOCOCCUS AUREUS (A)  Final      Susceptibility   Staphylococcus aureus - MIC*    CIPROFLOXACIN <=0.5 SENSITIVE Sensitive     GENTAMICIN <=0.5 SENSITIVE Sensitive     NITROFURANTOIN <=16 SENSITIVE Sensitive     OXACILLIN <=0.25 SENSITIVE Sensitive     TETRACYCLINE <=1 SENSITIVE Sensitive     VANCOMYCIN <=0.5 SENSITIVE Sensitive     TRIMETH/SULFA <=10 SENSITIVE Sensitive     CLINDAMYCIN <=0.25 SENSITIVE Sensitive     RIFAMPIN <=0.5 SENSITIVE Sensitive     Inducible Clindamycin NEGATIVE Sensitive     * >=100,000 COLONIES/mL STAPHYLOCOCCUS AUREUS  Culture, blood (routine x 2)     Status: Abnormal   Collection Time: 06/16/18  1:53 PM  Result Value Ref Range Status   Specimen Description   Final    LEFT ANTECUBITAL Performed at [facility], [address], [place], [place] [address]    Special Requests   Final    BOTTLES DRAWN AEROBIC ONLY Blood Culture results may not be optimal due to an inadequate volume of blood received in culture bottles Performed at [facility], [address], [place], [place] [address]    Culture  Setup Time   Final    AEROBIC BOTTLE ONLY GRAM POSITIVE COCCI Gram Stain Report Called to,Read Back By and Verified With: J HEARN,RN @0338  06/17/18 [facility] Performed at [facility], [address], [place], [place] [address]    Culture (A)  Final    STAPHYLOCOCCUS AUREUS SUSCEPTIBILITIES PERFORMED ON PREVIOUS CULTURE WITHIN THE LAST 5 DAYS. Performed at [facility], [place] [address] [place], [place] [address]    Report Status 06/19/2018 FINAL  Final  Culture, blood (routine x 2)     Status: Abnormal   Collection Time: 06/16/18  2:51 PM  Result Value Ref Range Status   Specimen Description   Final    BLOOD BLOOD RIGHT WRIST Performed at [facility], [address] [place], [place] [address]    Special Requests   Final    BOTTLES DRAWN AEROBIC AND ANAEROBIC Blood Culture results may not be optimal due to an inadequate volume of blood received in culture bottles Performed at [facility], [address],  [place], [place] [address]    Culture  Setup Time   Final    IN BOTH AEROBIC AND ANAEROBIC BOTTLES GRAM POSITIVE COCCI Gram Stain Report Called to,Read Back By and Verified With: J HEARN,RN @0338  06/17/18 [facility] CRITICAL RESULT CALLED TO, READ BACK BY AND VERIFIED WITH: PHARMD L POOLE [id number] MLM Performed at [facility], [place] [address] [place], [place] [address]    Culture STAPHYLOCOCCUS AUREUS (A)  Final   Report Status 06/19/2018 FINAL  Final   Organism ID, Bacteria STAPHYLOCOCCUS AUREUS  Final      Susceptibility   Staphylococcus aureus - MIC*    CIPROFLOXACIN <=0.5 SENSITIVE Sensitive     ERYTHROMYCIN >=8 RESISTANT Resistant     GENTAMICIN <=0.5 SENSITIVE Sensitive     OXACILLIN <=0.25 SENSITIVE Sensitive     TETRACYCLINE <=1 SENSITIVE Sensitive     VANCOMYCIN <=0.5 SENSITIVE Sensitive     TRIMETH/SULFA <=10 SENSITIVE Sensitive     CLINDAMYCIN <=0.25 SENSITIVE Sensitive     RIFAMPIN <=0.5 SENSITIVE Sensitive     Inducible Clindamycin NEGATIVE Sensitive     * STAPHYLOCOCCUS AUREUS  Blood Culture ID Panel (Reflexed)     Status: Abnormal   Collection Time: 06/16/18  2:51 PM  Result Value Ref Range Status   Enterococcus species NOT DETECTED NOT DETECTED Final   Listeria monocytogenes NOT DETECTED NOT DETECTED Final   Staphylococcus species DETECTED (A) NOT DETECTED Final    Comment: CRITICAL RESULT CALLED TO, READ BACK BY AND VERIFIED WITH: PHARMD L POOLE [id number] [phone] MLM    Staphylococcus aureus (BCID) DETECTED (A) NOT DETECTED Final    Comment: Methicillin (oxacillin) susceptible Staphylococcus aureus (MSSA). Preferred therapy is anti staphylococcal beta lactam antibiotic (Cefazolin or Nafcillin), unless  clinically contraindicated. CRITICAL RESULT CALLED TO, READ BACK BY AND VERIFIED WITH: PHARMD L POOLE [id number] [phone] MLM    Methicillin resistance NOT DETECTED NOT DETECTED Final   Streptococcus species NOT DETECTED NOT DETECTED Final   Streptococcus agalactiae NOT DETECTED NOT DETECTED Final   Streptococcus pneumoniae NOT DETECTED NOT DETECTED Final   Streptococcus pyogenes NOT DETECTED NOT DETECTED Final   Acinetobacter baumannii NOT DETECTED NOT DETECTED Final   Enterobacteriaceae species NOT DETECTED NOT DETECTED Final   Enterobacter cloacae complex NOT DETECTED NOT DETECTED Final   Escherichia coli NOT DETECTED NOT DETECTED Final   Klebsiella oxytoca NOT DETECTED NOT DETECTED Final   Klebsiella pneumoniae NOT DETECTED NOT DETECTED Final   Proteus species NOT DETECTED NOT DETECTED Final   Serratia marcescens NOT DETECTED NOT DETECTED Final   Haemophilus influenzae NOT DETECTED NOT DETECTED Final   Neisseria meningitidis NOT DETECTED NOT DETECTED Final   Pseudomonas aeruginosa NOT DETECTED NOT DETECTED Final   Candida albicans NOT DETECTED NOT DETECTED Final   Candida glabrata NOT DETECTED NOT DETECTED Final   Candida krusei NOT DETECTED NOT DETECTED Final   Candida parapsilosis NOT DETECTED NOT DETECTED Final   Candida tropicalis NOT DETECTED NOT DETECTED Final    Comment: Performed at [facility], [place], [place], [place] [address]  MRSA PCR Screening     Status: None   Collection Time: 06/17/18 12:13 PM  Result Value Ref Range Status   MRSA by PCR NEGATIVE NEGATIVE Final    Comment:        The GeneXpert MRSA Assay (FDA approved for NASAL specimens only), is one component of a comprehensive MRSA colonization surveillance program. It is not intended to diagnose MRSA infection nor to guide or  monitor treatment for MRSA infections. Performed at [facility], [place] [address] [place], [place] [address]   Aerobic/Anaerobic Culture (surgical/deep wound)      Status: None (Preliminary result)   Collection Time: 06/18/18 12:02 PM  Result Value Ref Range Status   Specimen Description VERTEBRA DISC  Final   Special Requests NONE  Final   Gram Stain   Final    ABUNDANT WBC PRESENT, PREDOMINANTLY PMN ABUNDANT GRAM POSITIVE COCCI IN PAIRS IN CLUSTERS Performed at [facility], [place] [address] [place], [place] [address]    Culture   Final    MODERATE STAPHYLOCOCCUS AUREUS NO ANAEROBES ISOLATED; CULTURE IN PROGRESS FOR 5 DAYS    Report Status PENDING  Incomplete   Organism ID, Bacteria STAPHYLOCOCCUS AUREUS  Final      Susceptibility   Staphylococcus aureus - MIC*    CIPROFLOXACIN <=0.5 SENSITIVE Sensitive     ERYTHROMYCIN >=8 RESISTANT Resistant     GENTAMICIN <=0.5 SENSITIVE Sensitive     OXACILLIN <=0.25 SENSITIVE Sensitive     TETRACYCLINE <=1 SENSITIVE Sensitive     VANCOMYCIN <=0.5 SENSITIVE Sensitive     TRIMETH/SULFA <=10 SENSITIVE Sensitive     CLINDAMYCIN <=0.25 SENSITIVE Sensitive     RIFAMPIN <=0.5 SENSITIVE Sensitive     Inducible Clindamycin NEGATIVE Sensitive     * MODERATE STAPHYLOCOCCUS AUREUS  Culture, blood (routine x 2)     Status: None (Preliminary result)   Collection Time: 06/18/18  4:15 PM  Result Value Ref Range Status   Specimen Description BLOOD LEFT ARM  Final   Special Requests   Final    BOTTLES DRAWN AEROBIC ONLY Blood Culture adequate volume   Culture NO GROWTH 4 DAYS  Final   Report Status PENDING  Incomplete  Culture, blood (routine x 2)     Status: None (Preliminary result)   Collection Time: 06/18/18  4:49 PM  Result Value Ref Range Status   Specimen Description BLOOD LEFT ANTECUBITAL  Final   Special Requests   Final    BOTTLES DRAWN AEROBIC AND ANAEROBIC Blood Culture adequate volume   Culture NO GROWTH 4 DAYS  Final   Report Status PENDING  Incomplete  Aerobic Culture (superficial specimen)     Status: None (Preliminary result)   Collection Time: 06/19/18  1:04 PM  Result Value Ref Range  Status   Specimen Description WOUND  Final   Special Requests PERINEAL  Final   Gram Stain   Final    NO WBC SEEN FEW YEAST Performed at [facility], [address] [place], [place] [address]    Culture MODERATE YEAST  Final   Report Status PENDING  Incomplete         Radiology Studies: Dg Chest Port 1 View  Result Date: 06/21/2018 CLINICAL DATA:  Post PICC placement. EXAM: PORTABLE CHEST 1 VIEW COMPARISON:  06/20/2018 and older exams. FINDINGS: New right-sided PICC tip projects in the lower superior vena cava near the caval atrial junction. There is opacity the left lung base, that appears increased from the previous day's study although the difference may be due to differences in patient positioning only. Mild opacity along minor fissure and at the right lung base is stable, likely atelectasis with small effusion. Suspect a left pleural effusion also with atelectasis versus pneumonia. Remainder of the lungs is clear.  No pneumothorax. IMPRESSION: 1. Right PICC tip projects in the lower superior vena cava near the caval atrial junction, well positioned. 2.  Possible increase in lung base opacity on the left. Suspect a combination of pleural fluid with either atelectasis or pneumonia. 3. No convincing residual pulmonary edema. Electronically Signed   By: Lajean Manes M.D.   On: 06/21/2018 19:56   Dg Chest Port 1 View  Result Date: 06/20/2018 CLINICAL DATA:  Shortness of breath. EXAM: PORTABLE CHEST 1 VIEW COMPARISON:  Chest x-ray 06/07/2018. FINDINGS: Patient is rotated to the right. Mediastinal widening noted., this is most likely vascular related to AP portable technique. Cardiomegaly with bilateral interstitial prominence and small bilateral pleural effusions. Findings suggest CHF. Bilateral mid lung field subsegmental atelectasis. No pneumothorax. IMPRESSION: Findings consistent with congestive heart failure bilateral interstitial edema bilateral pleural effusions. Pneumonitis can  not be excluded. Electronically Signed   By: Marcello Moores  Register   On: 06/20/2018 11:37   [place] Ekg Site Rite  Result Date: 06/21/2018 If Site Rite image not attached, placement could not be confirmed due to current cardiac rhythm.       Scheduled Meds: . aspirin  81 mg Oral Daily  . feeding supplement (GLUCERNA SHAKE)  237 mL Oral BID BM  . feeding supplement (PRO-STAT SUGAR FREE 64)  30 mL Oral BID  . [START ON 06/23/2018] furosemide  40 mg Intravenous Daily  . heparin injection (subcutaneous)  5,000 Units Subcutaneous Q8H  . insulin aspart  0-15 Units Subcutaneous TID WC  . insulin aspart  0-5 Units Subcutaneous QHS  . [START ON 06/23/2018] insulin glargine  25 Units Subcutaneous Daily  . lisinopril  10 mg Oral Daily  . methocarbamol  500 mg Oral Q6H  . metoprolol tartrate  25 mg Oral BID  . pantoprazole  40 mg Oral Daily  . vitamin B-12  1,000 mcg Oral Daily   Continuous Infusions: . sodium chloride 250 mL (06/20/18 0610)  .  ceFAZolin (ANCEF) IV Stopped (06/22/18 0857)     LOS: 6 days    Time spent: 35 mins.More than 50% of that time was spent in counseling and/or coordination of care.      Shelly Coss, MD [facility] Pager [phone]  If 7PM-7AM, please contact night-coverage [email or web address] Password [email or web address] 06/22/2018, 10:05 AM

## 2018-06-23 LAB — CULTURE, BLOOD (ROUTINE X 2)
Culture: NO GROWTH
Culture: NO GROWTH
Special Requests: ADEQUATE
Special Requests: ADEQUATE

## 2018-06-23 LAB — GLUCOSE, CAPILLARY
Glucose-Capillary: 183 mg/dL — ABNORMAL HIGH (ref 70–99)
Glucose-Capillary: 180 mg/dL — ABNORMAL HIGH (ref 70–99)
Glucose-Capillary: 174 mg/dL — ABNORMAL HIGH (ref 70–99)
Glucose-Capillary: 77 mg/dL (ref 70–99)

## 2018-06-23 LAB — BASIC METABOLIC PANEL
Anion gap: 11 (ref 5–15)
BUN: 17 mg/dL (ref 8–23)
CALCIUM: 7.9 mg/dL — AB (ref 8.9–10.3)
CO2: 29 mmol/L (ref 22–32)
Chloride: 90 mmol/L — ABNORMAL LOW (ref 98–111)
Creatinine, Ser: 0.62 mg/dL (ref 0.44–1.00)
GFR calc Af Amer: >60 mL/min (ref >60)
GFR calc non Af Amer: >60 mL/min (ref >60)
Glucose, Bld: 159 mg/dL — ABNORMAL HIGH (ref 70–99)
Potassium: 3.5 mmol/L (ref 3.5–5.1)
Sodium: 130 mmol/L — ABNORMAL LOW (ref 135–145)

## 2018-06-23 LAB — AEROBIC CULTURE W GRAM STAIN (SUPERFICIAL SPECIMEN): Gram Stain: NONE SEEN

## 2018-06-23 LAB — AEROBIC/ANAEROBIC CULTURE W GRAM STAIN (SURGICAL/DEEP WOUND)

## 2018-06-23 LAB — MAGNESIUM: Magnesium: 2 mg/dL (ref 1.7–2.4)

## 2018-06-23 MED ORDER — FUROSEMIDE 10 MG/ML IJ SOLN
40.0000 mg | Freq: Two times a day (BID) | INTRAMUSCULAR | Status: DC
  Administered 2018-06-23 – 2018-06-24 (×2): 40 mg via INTRAVENOUS
  Filled 2018-06-23 (×2): qty 4

## 2018-06-23 MED ORDER — GLUCERNA SHAKE PO LIQD
237.0000 mL | Freq: Three times a day (TID) | ORAL | Status: DC
  Administered 2018-06-23 – 2018-06-26 (×9): 237 mL via ORAL

## 2018-06-23 MED ORDER — ADULT MULTIVITAMIN W/MINERALS CH
1.0000 | ORAL_TABLET | Freq: Every day | ORAL | Status: DC
  Administered 2018-06-23 – 2018-06-26 (×4): 1 via ORAL
  Filled 2018-06-23 (×4): qty 1

## 2018-06-23 NOTE — Clinical Social Work Placement (Signed)
   CLINICAL SOCIAL WORK PLACEMENT  NOTE  Date:  06/23/2018  Patient Details  Name: [name] MRN: [id number] Date of Birth: June 19, 1950  Clinical Social Work is seeking post-discharge placement for this patient at the [place] level of care (*CSW will initial, date and re-position this form in  chart as items are completed):      Patient/family provided with [place] Work Department's list of facilities offering this level of care within the geographic area requested by the patient (or if unable, by the patient's family).      Patient/family informed of their freedom to choose among providers that offer the needed level of care, that participate in Medicare, Medicaid or managed care program needed by the patient, have an available bed and are willing to accept the patient.      Patient/family informed of [place]'s ownership interest in [facility] and [facility], as well as of the fact that they are under no obligation to receive care at these facilities.  PASRR submitted to EDS on 06/23/18     PASRR number received on 06/23/18     Existing PASRR number confirmed on       FL2 transmitted to all facilities in geographic area requested by pt/family on 06/23/18     FL2 transmitted to all facilities within larger geographic area on       Patient informed that his/her managed care company has contracts with or will negotiate with certain facilities, including the following:            Patient/family informed of bed offers received.  Patient chooses bed at       Physician recommends and patient chooses bed at      Patient to be transferred to   on  .  Patient to be transferred to facility by       Patient family notified on   of transfer.  Name of family member notified:        PHYSICIAN Please sign FL2     Additional Comment:    _______________________________________________ Candie Chroman, LCSW 06/23/2018, 3:44 PM

## 2018-06-23 NOTE — NC FL2 (Signed)
[place] LEVEL OF CARE SCREENING TOOL     IDENTIFICATION  Patient Name: Kaitlin Vargas Birthdate: 08-Mar-1951 Sex: female Admission Date (Current Location): 06/16/2018  [facility] and [place] Number:  [employer] and Address:  [facility] [place], [place] [address], [place], [place] [address]      Provider Number: [id number]  Attending Physician Name and Address:  Shelly Coss, MD  Relative Name and Phone Number:       Current Level of Care: Hospital Recommended Level of Care: [place] Prior Approval Number:    Date Approved/Denied:   PASRR Number: [id number]  Discharge Plan: SNF    Current Diagnoses: Patient Active Problem List   Diagnosis Date Noted  . Cardiac arrest, cause unspecified ([place])   . Acute encephalopathy   . MSSA bacteremia 06/17/2018  . 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 DKA (diabetic ketoacidosis) ([place]) 06/16/2018  . Diabetic foot ulcer ([place]) 06/16/2018  . Acute hypoxemic respiratory failure ([place]) 06/07/2018  . Essential hypertension 06/07/2018  . Type 2 diabetes mellitus with hyperlipidemia ([place]) 06/07/2018  . Anemia 06/07/2018  . Acute bronchitis 06/07/2018    Orientation RESPIRATION BLADDER Height & Weight     Self, Time, Situation, Place  O2(Nasal Canula 1 L) Incontinent, External catheter Weight: 160 lb 15 oz (73 kg) Height:  4\' 10"  (147.3 cm)  BEHAVIORAL SYMPTOMS/MOOD NEUROLOGICAL BOWEL NUTRITION STATUS  (None) (None) Continent Diet(Carb modified)  AMBULATORY STATUS COMMUNICATION OF NEEDS Skin   Extensive Assist Verbally Bruising, Other (Comment)(Blister, Fissure, MASD, Rash. Diabetic ulcer on right foot: No dressing.)                       Personal Care Assistance Level of Assistance  Bathing, Feeding, Dressing Bathing Assistance: Maximum assistance Feeding assistance: Limited assistance Dressing Assistance: Maximum assistance     Functional Limitations Info  Sight, Hearing, Speech Sight  Info: Adequate Hearing Info: Adequate Speech Info: Adequate    SPECIAL CARE FACTORS FREQUENCY  PT (By licensed PT), Blood pressure, OT (By licensed OT)     PT Frequency: 5 x week OT Frequency: 5 x week            Contractures Contractures Info: Not present    Additional Factors Info  Code Status, Allergies Code Status Info: Full code Allergies Info: Sulfa Antibiotics           Current Medications (06/23/2018):  This is the current hospital active medication list Current Facility-Administered Medications  Medication Dose Route Frequency Provider Last Rate Last Dose  . 0.9 %  sodium chloride infusion   Intravenous PRN Rush Farmer, MD 10 mL/hr at 06/20/18 0610 250 mL at 06/20/18 0610  . acetaminophen (TYLENOL) tablet 650 mg  650 mg Oral Q6H PRN Karmen Bongo, MD   650 mg at 06/20/18 6269  . albuterol (PROVENTIL) (2.5 MG/3ML) 0.083% nebulizer solution 2.5 mg  2.5 mg Nebulization Q2H PRN Karmen Bongo, MD      . aspirin chewable tablet 81 mg  81 mg Oral Daily Karmen Bongo, MD   81 mg at 06/23/18 0936  . ceFAZolin (ANCEF) IVPB 2g/100 mL premix  2 g Intravenous Q8H Susa Raring, RPH 200 mL/hr at 06/23/18 1435 2 g at 06/23/18 1435  . feeding supplement (GLUCERNA SHAKE) (GLUCERNA SHAKE) liquid 237 mL  237 mL Oral TID BM Adhikari, Amrit, MD   237 mL at 06/23/18 1434  . feeding supplement (PRO-STAT SUGAR FREE 64) liquid 30 mL  30  mL Oral BID Kathie Dike, MD   30 mL at 06/23/18 0927  . fentaNYL (SUBLIMAZE) injection 25-50 mcg  25-50 mcg Intravenous Q2H PRN Desai, Rahul P, PA-C   50 mcg at 06/23/18 0932  . furosemide (LASIX) injection 40 mg  40 mg Intravenous Q12H Adhikari, Amrit, MD      . heparin injection 5,000 Units  5,000 Units Subcutaneous Q8H Shelly Coss, MD   5,000 Units at 06/23/18 1436  . insulin aspart (novoLOG) injection 0-15 Units  0-15 Units Subcutaneous TID WC Kathie Dike, MD   3 Units at 06/23/18 1206  . insulin aspart (novoLOG) injection 0-5  Units  0-5 Units Subcutaneous QHS Kathie Dike, MD   2 Units at 06/19/18 2142  . insulin glargine (LANTUS) injection 25 Units  25 Units Subcutaneous Daily Shelly Coss, MD   25 Units at 06/23/18 [phone]  . lisinopril (PRINIVIL,ZESTRIL) tablet 10 mg  10 mg Oral Daily Karmen Bongo, MD   10 mg at 06/23/18 0935  . methocarbamol (ROBAXIN) tablet 500 mg  500 mg Oral Q6H Meredith Staggers, MD   500 mg at 06/23/18 1132  . metoprolol tartrate (LOPRESSOR) tablet 25 mg  25 mg Oral BID Kathie Dike, MD   25 mg at 06/23/18 0935  . multivitamin with minerals tablet 1 tablet  1 tablet Oral Daily Shelly Coss, MD   1 tablet at 06/23/18 1205  . ondansetron (ZOFRAN) injection 4 mg  4 mg Intravenous Q6H PRN Karmen Bongo, MD   4 mg at 06/18/18 0947  . pantoprazole (PROTONIX) EC tablet 40 mg  40 mg Oral Daily Karmen Bongo, MD   40 mg at 06/23/18 0936  . sodium chloride flush (NS) 0.9 % injection 10-40 mL  10-40 mL Intracatheter PRN Shelly Coss, MD      . vitamin B-12 (CYANOCOBALAMIN) tablet 1,000 mcg  1,000 mcg Oral Daily Karmen Bongo, MD   1,000 mcg at 06/23/18 6433     Discharge Medications: Please see discharge summary for a list of discharge medications.  Relevant Imaging Results:  Relevant Lab Results:   Additional Information SS#: [id number]  Candie Chroman, LCSW

## 2018-06-23 NOTE — Progress Notes (Signed)
PROGRESS NOTE    Kaitlin Vargas  DPO:[id number] DOB: 25-Jun-1950 DOA: 06/16/2018 PCP: [name], MD   Brief Narrative: Patient is a 68 year old female who was admitted initially at [facility]  for back pain.  She was discharged from there on 06/10/2019 when she was treated for acute hypoxemic respiratory failure secondary to acute bronchitis.  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  She was also found to have a ulcer on her right foot.  She was transferred to [facility] for IR guided drainage of the abscess..  During the procedure ,she received fentanyl and Versed but she underwent cardiac arrest.  She required CPR of 90 seconds with return of spontaneous circulation.  Did not require intubation.  She was admitted to pulmonary critical care service.  She was transferred to hospitalist service on 06/20/18. Her blood cultures also showed MSSA.  Currently on cefazolin.  No evidence of endocarditis.  ID recommended antibiotics per 6-8 weeks.  Plan for PICC line today.  Planning for discharge to CIR. She  is volume overloaded so started on IV Lasix. She is medically stable for discharge to inpatient rehab as soon as the bed is available.  Assessment & Plan:   Principal Problem:   MSSA bacteremia Active Problems:   Essential hypertension   Type 2 diabetes mellitus with hyperlipidemia (HCC)   Diskitis   DKA (diabetic ketoacidosis) ([place])   Diabetic foot ulcer ([place])   Cardiac arrest, cause unspecified ([place])   Acute encephalopathy  MSSA bacteremia: ID recommending cefazolin for total of 8 weeks. PlCC placed. Blood cultures sent on 1/29 remain negative.  End date of antibiotic will be on March 25. She was found to have osteomyelitis/discitis of the lumbar spine.  Disc aspiration showed gram-positive cocci on stain.  Urine culture also showed Staphylococcus aureus.  Echocardiogram did not show any vegetation.  Status  post cardiac arrest: While at interventional radiology.  At brief CPR for 90 seconds.  Did not need intubation.  Acute respiratory failure/dyspnea/cough: Chest x-ray  showed pulmonary congestion with findings of CHF.  Echocardiogram done here showed normal left ventricular ejection fraction.  Likely diastolic CHF.  She might have been volume overloaded due to fluid resuscitation after cardiac arrest. She was started  Lasix 40 mg IV every 12 with good response. Improving  bilateral lower extremity edema. Respiratory status stable.  Poorly controlled diabetes mellitus/DKA: She was found to have DKA on admission.  Continue current insulin regimen.  She was on oral medications at home.  Currently she is on long-acting and sliding scale insulin here.  HbA1C of 8.5 as per 1/18.  Right foot wound: Improving with antibiotics.  Likely associated with diabetic ulcer.  MRI of the right foot did not show any osteomyelitis, fracture or dislocation.  He needs to follow-up with podiatry as an outpatient.  Hypertension: Mildly hypertensive.  Continue current medications.Home medications restarted.  Deconditioning/debility: PT evaluated her and recommended CIR on discharge   Nutrition Problem: Increased nutrient needs Etiology: wound healing      DVT prophylaxis: Heparin  code Status: Full Family Communication: None present at the bedside Disposition Plan: CIR as soon as the bed is available   Consultants: ID, PCCM  Procedures: None  Antimicrobials:  Anti-infectives (From admission, onward)   Start     Dose/Rate Route Frequency Ordered Stop   06/17/18 1230  ceFAZolin (ANCEF) IVPB 2g/100 mL premix     2 g 200 mL/hr over  30 Minutes Intravenous Every 8 hours 06/17/18 1131     06/16/18 1345  cefTRIAXone (ROCEPHIN) 1 g in sodium chloride 0.9 % 100 mL IVPB     1 g 200 mL/hr over 30 Minutes Intravenous  Once 06/16/18 1340 06/16/18 1614      Subjective: Patient seen and examined the bedside this  morning.  Remains hemodynamically stable.  Still has bilateral lower extremity edema and chest sounds congested so continued on IV Lasix .  Counselled  to limit fluid intake.  Objective: Vitals:   06/22/18 1137 06/22/18 2049 06/23/18 0643 06/23/18 0926  BP: 138/79 129/68 (!) 157/82 139/75  Pulse: (!) 105 88 93 92  Resp: 18 18 18    Temp: 98.8 F (37.1 C) 98.6 F (37 C) 98.6 F (37 C)   TempSrc: Oral Oral Oral   SpO2: 98% 98% 93% 95%  Weight:   73 kg   Height:        Intake/Output Summary (Last 24 hours) at 06/23/2018 1115 Last data filed at 06/23/2018 0900 Gross per 24 hour  Intake 1540 ml  Output 600 ml  Net 940 ml   Filed Weights   06/21/18 0411 06/22/18 0500 06/23/18 0643  Weight: 74.1 kg 72.7 kg 73 kg    Examination: General exam: Generalized weakness  HEENT:PERRL,Oral mucosa moist, Ear/Nose normal on gross exam Respiratory system: Bilateral creased air entry on the bases Cardiovascular system: S1 & S2 heard, RRR. No JVD, murmurs, rubs, gallops or clicks. Gastrointestinal system: Abdomen is nondistended, soft and nontender. No organomegaly or masses felt. Normal bowel sounds heard. Central nervous system: Alert and oriented. No focal neurological deficits. Extremities: 1-2+edema of bilateral lower extremities, no clubbing ,no cyanosis, distal peripheral pulses palpable. Skin: No rashes, lesions or ulcers,no icterus ,no pallor      Data Reviewed: I have personally reviewed following labs and imaging studies  CBC: Recent Labs  Lab 06/16/18 1244 06/18/18 0500 06/19/18 0426 06/20/18 0557  WBC 22.6* 12.6* 9.6 10.8*  NEUTROABS 20.4*  --   --   --   HGB 11.6* 9.9* 9.2* 9.9*  HCT 38.3 31.5* 28.9* 29.5*  MCV 107.3* 101.9* 100.0 97.7  PLT 424* 429* 388 324   Basic Metabolic Panel: Recent Labs  Lab 06/18/18 0500 06/19/18 0426 06/20/18 0557 06/21/18 0733 06/22/18 0600 06/23/18 0327  NA 132* 132* 129* 129* 129* 130*  K 4.5 3.7 3.4* 3.2* 3.6 3.5  CL 101 100 94*  91* 91* 90*  CO2 23 24 26 25 25 29   GLUCOSE 202* 186* 176* 266* 239* 159*  BUN 18 10 10 12 13 17   CREATININE 0.57 0.56 0.51 0.66 0.70 0.62  CALCIUM 8.2* 8.0* 8.2* 8.1* 7.9* 7.9*  MG 1.4*  --  1.6*  --  1.4* 2.0  PHOS  --   --  2.6  --   --   --    GFR: Estimated Creatinine Clearance: 56.3 mL/min (by C-G formula based on SCr of 0.62 mg/dL). Liver Function Tests: Recent Labs  Lab 06/16/18 1451  AST 12*  ALT 10  ALKPHOS 103  BILITOT 2.8*  PROT 7.1  ALBUMIN 2.5*   Recent Labs  Lab 06/16/18 1451  LIPASE 17   No results for input(s): AMMONIA in the last 168 hours. Coagulation Profile: Recent Labs  Lab 06/17/18 0853  INR 1.04   Cardiac Enzymes: No results for input(s): CKTOTAL, CKMB, CKMBINDEX, TROPONINI in the last 168 hours. BNP (last 3 results) No results for input(s): PROBNP in the last 8760  hours. HbA1C: No results for input(s): HGBA1C in the last 72 hours. CBG: Recent Labs  Lab 06/22/18 0726 06/22/18 1138 06/22/18 1633 06/22/18 2132 06/23/18 0753  GLUCAP 247* 246* 191* 133* 183*   Lipid Profile: No results for input(s): CHOL, HDL, LDLCALC, TRIG, CHOLHDL, LDLDIRECT in the last 72 hours. Thyroid Function Tests: No results for input(s): TSH, T4TOTAL, FREET4, T3FREE, THYROIDAB in the last 72 hours. Anemia Panel: No results for input(s): VITAMINB12, FOLATE, FERRITIN, TIBC, IRON, RETICCTPCT in the last 72 hours. Sepsis Labs: Recent Labs  Lab 06/16/18 1451 06/16/18 1750  LATICACIDVEN 1.5 1.1    Recent Results (from the past 240 hour(s))  Urine culture     Status: Abnormal   Collection Time: 06/16/18 12:57 PM  Result Value Ref Range Status   Specimen Description   Final    URINE, CLEAN CATCH Performed at [facility], [address] [place], [place] [address]    Special Requests   Final    NONE Performed at [facility], [address], [place], [place] [address]    Culture (A)  Final    >=100,000 COLONIES/mL STAPHYLOCOCCUS AUREUS >=100,000  COLONIES/mL GRANULICATELLA ADIACENS Standardized susceptibility testing for this organism is not available. Performed at [facility], [place] [address] [place], [place] [address]    Report Status 06/20/2018 FINAL  Final   Organism ID, Bacteria STAPHYLOCOCCUS AUREUS (A)  Final      Susceptibility   Staphylococcus aureus - MIC*    CIPROFLOXACIN <=0.5 SENSITIVE Sensitive     GENTAMICIN <=0.5 SENSITIVE Sensitive     NITROFURANTOIN <=16 SENSITIVE Sensitive     OXACILLIN <=0.25 SENSITIVE Sensitive     TETRACYCLINE <=1 SENSITIVE Sensitive     VANCOMYCIN <=0.5 SENSITIVE Sensitive     TRIMETH/SULFA <=10 SENSITIVE Sensitive     CLINDAMYCIN <=0.25 SENSITIVE Sensitive     RIFAMPIN <=0.5 SENSITIVE Sensitive     Inducible Clindamycin NEGATIVE Sensitive     * >=100,000 COLONIES/mL STAPHYLOCOCCUS AUREUS  Culture, blood (routine x 2)     Status: Abnormal   Collection Time: 06/16/18  1:53 PM  Result Value Ref Range Status   Specimen Description   Final    LEFT ANTECUBITAL Performed at [facility], [address] [place], [place] [address]    Special Requests   Final    BOTTLES DRAWN AEROBIC ONLY Blood Culture results may not be optimal due to an inadequate volume of blood received in culture bottles Performed at [facility], [address], [place], [place] [address]    Culture  Setup Time   Final    AEROBIC BOTTLE ONLY GRAM POSITIVE COCCI Gram Stain Report Called to,Read Back By and Verified With: J HEARN,RN @0338  06/17/18 [facility] Performed at [facility], [address], [place], [place] [address]    Culture (A)  Final    STAPHYLOCOCCUS AUREUS SUSCEPTIBILITIES PERFORMED ON PREVIOUS CULTURE WITHIN THE LAST 5 DAYS. Performed at [facility], [place] [address], [place], [place] [address]    Report Status 06/19/2018 FINAL  Final  Culture, blood (routine x 2)     Status: Abnormal   Collection Time: 06/16/18  2:51 PM  Result Value Ref Range Status   Specimen Description   Final     BLOOD BLOOD RIGHT WRIST Performed at [facility], [address], [place],  [address]    Special Requests   Final    BOTTLES DRAWN AEROBIC AND ANAEROBIC Blood Culture results may not be optimal due to an  inadequate volume of blood received in culture bottles Performed at [place] [place], [place] [address]    Culture  Setup Time   Final    IN BOTH AEROBIC AND ANAEROBIC BOTTLES GRAM POSITIVE COCCI Gram Stain Report Called to,Read Back By and Verified With: J HEARN,RN @0338  06/17/18 [facility] CRITICAL RESULT CALLED TO, READ BACK BY AND VERIFIED WITH: PHARMD L POOLE [phone] MLM Performed at [facility], [place] [address], [place], [place] [address]    Culture STAPHYLOCOCCUS AUREUS (A)  Final   Report Status 06/19/2018 FINAL  Final   Organism ID, Bacteria STAPHYLOCOCCUS AUREUS  Final      Susceptibility   Staphylococcus aureus - MIC*    CIPROFLOXACIN <=0.5 SENSITIVE Sensitive     ERYTHROMYCIN >=8 RESISTANT Resistant     GENTAMICIN <=0.5 SENSITIVE Sensitive     OXACILLIN <=0.25 SENSITIVE Sensitive     TETRACYCLINE <=1 SENSITIVE Sensitive     VANCOMYCIN <=0.5 SENSITIVE Sensitive     TRIMETH/SULFA <=10 SENSITIVE Sensitive     CLINDAMYCIN <=0.25 SENSITIVE Sensitive     RIFAMPIN <=0.5 SENSITIVE Sensitive     Inducible Clindamycin NEGATIVE Sensitive     * STAPHYLOCOCCUS AUREUS  Blood Culture ID Panel (Reflexed)     Status: Abnormal   Collection Time: 06/16/18  2:51 PM  Result Value Ref Range Status   Enterococcus species NOT DETECTED NOT DETECTED Final   Listeria monocytogenes NOT DETECTED NOT DETECTED Final   Staphylococcus species DETECTED (A) NOT DETECTED Final    Comment: CRITICAL RESULT CALLED TO, READ BACK BY AND VERIFIED WITH: PHARMD L POOLE [phone] MLM    Staphylococcus aureus (BCID) DETECTED (A) NOT DETECTED Final    Comment: Methicillin (oxacillin) susceptible Staphylococcus aureus (MSSA). Preferred therapy is anti staphylococcal beta lactam  antibiotic (Cefazolin or Nafcillin), unless clinically contraindicated. CRITICAL RESULT CALLED TO, READ BACK BY AND VERIFIED WITH: PHARMD L POOLE [id number] MLM    Methicillin resistance NOT DETECTED NOT DETECTED Final   Streptococcus species NOT DETECTED NOT DETECTED Final   Streptococcus agalactiae NOT DETECTED NOT DETECTED Final   Streptococcus pneumoniae NOT DETECTED NOT DETECTED Final   Streptococcus pyogenes NOT DETECTED NOT DETECTED Final   Acinetobacter baumannii NOT DETECTED NOT DETECTED Final   Enterobacteriaceae species NOT DETECTED NOT DETECTED Final   Enterobacter cloacae complex NOT DETECTED NOT DETECTED Final   Escherichia coli NOT DETECTED NOT DETECTED Final   Klebsiella oxytoca NOT DETECTED NOT DETECTED Final   Klebsiella pneumoniae NOT DETECTED NOT DETECTED Final   Proteus species NOT DETECTED NOT DETECTED Final   Serratia marcescens NOT DETECTED NOT DETECTED Final   Haemophilus influenzae NOT DETECTED NOT DETECTED Final   Neisseria meningitidis NOT DETECTED NOT DETECTED Final   Pseudomonas aeruginosa NOT DETECTED NOT DETECTED Final   Candida albicans NOT DETECTED NOT DETECTED Final   Candida glabrata NOT DETECTED NOT DETECTED Final   Candida krusei NOT DETECTED NOT DETECTED Final   Candida parapsilosis NOT DETECTED NOT DETECTED Final   Candida tropicalis NOT DETECTED NOT DETECTED Final    Comment: Performed at [facility], [place]. [address] [place], [place] [address]  MRSA PCR Screening     Status: None   Collection Time: 06/17/18 12:13 PM  Result Value Ref Range Status   MRSA by PCR NEGATIVE NEGATIVE Final    Comment:        The GeneXpert MRSA Assay (FDA approved for NASAL specimens only), is one component of a comprehensive  MRSA colonization surveillance program. It is not intended to diagnose MRSA infection nor to guide or monitor treatment for MRSA infections. Performed at [facility], [place] [address], [place], [place] [address]     Aerobic/Anaerobic Culture (surgical/deep wound)     Status: None (Preliminary result)   Collection Time: 06/18/18 12:02 PM  Result Value Ref Range Status   Specimen Description VERTEBRA DISC  Final   Special Requests NONE  Final   Gram Stain   Final    ABUNDANT WBC PRESENT, PREDOMINANTLY PMN ABUNDANT GRAM POSITIVE COCCI IN PAIRS IN CLUSTERS Performed at [facility], [place] [address], [place], [place] [address]    Culture   Final    MODERATE STAPHYLOCOCCUS AUREUS NO ANAEROBES ISOLATED; CULTURE IN PROGRESS FOR 5 DAYS    Report Status PENDING  Incomplete   Organism ID, Bacteria STAPHYLOCOCCUS AUREUS  Final      Susceptibility   Staphylococcus aureus - MIC*    CIPROFLOXACIN <=0.5 SENSITIVE Sensitive     ERYTHROMYCIN >=8 RESISTANT Resistant     GENTAMICIN <=0.5 SENSITIVE Sensitive     OXACILLIN <=0.25 SENSITIVE Sensitive     TETRACYCLINE <=1 SENSITIVE Sensitive     VANCOMYCIN <=0.5 SENSITIVE Sensitive     TRIMETH/SULFA <=10 SENSITIVE Sensitive     CLINDAMYCIN <=0.25 SENSITIVE Sensitive     RIFAMPIN <=0.5 SENSITIVE Sensitive     Inducible Clindamycin NEGATIVE Sensitive     * MODERATE STAPHYLOCOCCUS AUREUS  Culture, blood (routine x 2)     Status: None   Collection Time: 06/18/18  4:15 PM  Result Value Ref Range Status   Specimen Description BLOOD LEFT ARM  Final   Special Requests   Final    BOTTLES DRAWN AEROBIC ONLY Blood Culture adequate volume   Culture   Final    NO GROWTH 5 DAYS Performed at [facility], [address], [place], [place] [address]    Report Status 06/23/2018 FINAL  Final  Culture, blood (routine x 2)     Status: None   Collection Time: 06/18/18  4:49 PM  Result Value Ref Range Status   Specimen Description BLOOD LEFT ANTECUBITAL  Final   Special Requests   Final    BOTTLES DRAWN AEROBIC AND ANAEROBIC Blood Culture adequate volume   Culture   Final    NO GROWTH 5 DAYS Performed at [facility], [place] [address], [place], [place]  [address]    Report Status 06/23/2018 FINAL  Final  Aerobic Culture (superficial specimen)     Status: None   Collection Time: 06/19/18  1:04 PM  Result Value Ref Range Status   Specimen Description WOUND  Final   Special Requests PERINEAL  Final   Gram Stain   Final    NO WBC SEEN FEW YEAST Performed at [facility], [place] [address], [place],  [address]    Culture MODERATE CANDIDA ALBICANS  Final   Report Status 06/23/2018 FINAL  Final         Radiology Studies: Dg Chest Port 1 View  Result Date: 06/21/2018 CLINICAL DATA:  Post PICC placement. EXAM: PORTABLE CHEST 1 VIEW COMPARISON:  06/20/2018 and older exams. FINDINGS: New right-sided PICC tip projects in the lower superior vena cava near the caval atrial junction. There is opacity the left lung base, that appears increased from the previous day's study although the difference may be due to differences in patient positioning only. Mild opacity along minor fissure and  at the right lung base is stable, likely atelectasis with small effusion. Suspect a left pleural effusion also with atelectasis versus pneumonia. Remainder of the lungs is clear.  No pneumothorax. IMPRESSION: 1. Right PICC tip projects in the lower superior vena cava near the caval atrial junction, well positioned. 2. Possible increase in lung base opacity on the left. Suspect a combination of pleural fluid with either atelectasis or pneumonia. 3. No convincing residual pulmonary edema. Electronically Signed   By: Lajean Manes M.D.   On: 06/21/2018 19:56        Scheduled Meds: . aspirin  81 mg Oral Daily  . feeding supplement (GLUCERNA SHAKE)  237 mL Oral BID BM  . feeding supplement (PRO-STAT SUGAR FREE 64)  30 mL Oral BID  . furosemide  40 mg Intravenous Q12H  . heparin injection (subcutaneous)  5,000 Units Subcutaneous Q8H  . insulin aspart  0-15 Units Subcutaneous TID WC  . insulin aspart  0-5 Units Subcutaneous QHS  . insulin glargine  25 Units  Subcutaneous Daily  . lisinopril  10 mg Oral Daily  . methocarbamol  500 mg Oral Q6H  . metoprolol tartrate  25 mg Oral BID  . pantoprazole  40 mg Oral Daily  . vitamin B-12  1,000 mcg Oral Daily   Continuous Infusions: . sodium chloride 250 mL (06/20/18 0610)  .  ceFAZolin (ANCEF) IV 2 g (06/23/18 0647)     LOS: 7 days    Time spent: 35 mins.More than 50% of that time was spent in counseling and/or coordination of care.      Shelly Coss, MD [facility] Pager [phone]  If 7PM-7AM, please contact night-coverage [email or web address] Password [email or web address] 06/23/2018, 11:15 AM

## 2018-06-23 NOTE — Progress Notes (Signed)
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 Foot imaging negative for fracture. PMH PN, HTN, DM     PT Comments    Pt admitted with above diagnosis. Pt currently with functional limitations due to balance and endurance deficits. Pt was able to stand to [facility] with mod assist and LEs not buckling today.  Pt stood x 2 with better posture for up to a minute each time.  Had to clean pt while standing as her purewick had leaked.  Will continue to follow acutely.   Pt will benefit from skilled PT to increase their independence and safety with mobility to allow discharge to the venue listed below.     Follow Up Recommendations  CIR     Equipment Recommendations  Other (comment)(TBD )    Recommendations for Other Services       Precautions / Restrictions Precautions Precautions: Fall;Back Precaution Comments: used logrolling  and 3/3 back precautions for comfort  Restrictions Weight Bearing Restrictions: No    Mobility  Bed Mobility Overal bed mobility: Needs Assistance Bed Mobility: Rolling;Sidelying to Sit Rolling: Mod assist Sidelying to sit: Mod assist       General bed mobility comments: ModA to roll with good form and coordination, ModA to power up trunk to sitting upright at EOB, once up able to maintain balance with S   Transfers Overall transfer level: Needs assistance   Transfers: Sit to/from Stand;Stand Pivot Transfers Sit to Stand: +2 physical assistance;Mod assist;From elevated surface         General transfer comment: Pt was able to stand to [facility] with mod assist for support.  Pt needed to be cleaned and  nurse present and cleaned pt.  Pt did not buckle today and was able to safely sit on STedy and be moved to chair. STood 2 more times for about a minute each time.     Ambulation/Gait             General Gait Details: deferred, will require +2 for safety and incontinent with standing    Stairs             Wheelchair Mobility    Modified Rankin (Stroke Patients Only)       Balance Overall balance assessment: Needs assistance Sitting-balance support: Bilateral upper extremity supported;Feet supported Sitting balance-Leahy Scale: Good     Standing balance support: Bilateral upper extremity supported;During functional activity Standing balance-Leahy Scale: Poor Standing balance comment: Relies on Bil UE support and external support of 2 persons up to mod assist with better standing balance today                            Cognition Arousal/Alertness: Awake/alert Behavior During Therapy: WFL for tasks assessed/performed Overall Cognitive Status: Within Functional Limits for tasks assessed                                        Exercises General Exercises - Lower Extremity Long Arc Quad: AROM;Both;15 reps;Seated Hip Flexion/Marching: AROM;Both;10 reps;Seated    General  Comments General comments (skin integrity, edema, etc.): Nurse replacing the Purewick as PT left      Pertinent Vitals/Pain Pain Assessment: Faces Faces Pain Scale: Hurts little more Pain Location: back and legs  Pain Descriptors / Indicators: Aching;Sore Pain Intervention(s): Limited activity within patient's tolerance;Monitored during session;Repositioned    Home Living                      Prior Function            PT Goals (current goals can now be found in the care plan section) Acute Rehab PT Goals Patient Stated Goal: get stronger, go home  Progress towards PT goals: Progressing toward goals    Frequency    Min 3X/week      PT Plan  Current plan remains appropriate    Co-evaluation              AM-PAC PT "6 Clicks" Mobility   Outcome Measure  Help needed turning from your back to your side while in a flat bed without using bedrails?: A Lot Help needed moving from lying on your back to sitting on the side of a flat bed without using bedrails?: A Lot Help needed moving to and from a bed to a chair (including a wheelchair)?: A Lot Help needed standing up from a chair using your arms (e.g., wheelchair or bedside chair)?: A Lot Help needed to walk in hospital room?: Total Help needed climbing 3-5 steps with a railing? : Total 6 Click Score: 10    End of Session Equipment Utilized During Treatment: Gait belt Activity Tolerance: Patient limited by fatigue;Patient limited by pain Patient left: in chair;with call bell/phone within reach;with chair alarm set Nurse Communication: Mobility status;Need for lift equipment PT Visit Diagnosis: Unsteadiness on feet (R26.81);Difficulty in walking, not elsewhere classified (R26.2);Muscle weakness (generalized) (M62.81)     Time: 1131-1150 PT Time Calculation (min) (ACUTE ONLY): 19 min  Charges:  $Therapeutic Activity: 8-22 mins                     [place] Pager:  [phone]  Office:  [place] 06/23/2018, 1:30 PM

## 2018-06-23 NOTE — Progress Notes (Signed)
IP rehab admissions - I met with patient today.  She would like CIR here at [facility].  Awaiting OT evaluation.  I will open case with insurance carrier and request acute inpatient rehab admission.  Call me for questions.  [phone]

## 2018-06-23 NOTE — Progress Notes (Signed)
Nutrition Follow-up  DOCUMENTATION CODES:   Obesity unspecified  INTERVENTION:   -Continue 30 ml Prostat BID, each supplement provides 100 kcals and 15 grams protein -Increase Glucerna Shake po to TID, each supplement provides 220 kcal and 10 grams of protein -MVI with minerals daily  NUTRITION DIAGNOSIS:   Increased nutrient needs related to wound healing as evidenced by estimated needs.  Ongoing  GOAL:   Patient will meet greater than or equal to 90% of their needs  Progressing  MONITOR:   PO intake, Labs, I & O's, Supplement acceptance, Diet advancement  REASON FOR ASSESSMENT:   Consult Wound healing  ASSESSMENT:   68 y/o female w/ PMHx of HTN/HLD, DM2. Presented with weakness and back pain, latter of which she has had x4 weeks. Had just been admitted 1/18-1/20 d/t cough and was dx w/ adenovirus. Now represents with continued cough, back pain and new urinary incontinence. Work up revealed large abscess in R paraspinous tissues. Also noted to have a R diabetic foot ulcer and be in DKA. RD consulted for wound healing.   1/29- x-fer to [facility]; MRI of lumbar spine showed L1-2 discitis/osteomyelitis with extensive phlegmon and abscess in the R-sided paravertebral soft tissues; S/p L1-2 disc aspiration  2/1- PICC placed  Reviewed I/O's: +690 ml x 24 hours and +5.8 L since admission  Case discussed with RN, who reports pt with poor appetite, however, drinks Glucerna supplements well.   Spoke with pt, who had flat affect. She endorses poor appetite, which she attributes to pain. She estimates she is consuming "less than half" of meals- meal completion documented as 25-50%. Pt reports she consumed a bowl of oatmeal this morning and that was an improvement for her in comparison to how she ate last week. Pt sipping on a Glucerna supplement, which she enjoys- she reports consuming 20-3 per day. She does not recall taking Prostat, however. Discussed with pt importance of good  nutritional intake (both meals and supplements) to support healing. Pt amenable to continue supplements.   Per MD notes, plan to d/c to CIR once medically stable.   Last Hgb A1c: 8.7 (06/20/18). PTA DM medications are 10 mg glipizide BID and 1000 mg metformin BID. Suspect infections may be elevating CBGS  Labs reviewed: Na: 130, CBGS: 133-246 (inpatient orders for glycemic control are 0-15 units insulin aspart TID with meals, 0-5 units insulin aspartq HS, and 25 units insulin glargine daily).   Diet Order:   Diet Order            Diet Carb Modified Fluid consistency: Thin; Room service appropriate? Yes  Diet effective now              EDUCATION NEEDS:   No education needs have been identified at this time  Skin:  Skin Assessment: Skin Integrity Issues: Skin Integrity Issues:: Diabetic Ulcer Diabetic Ulcer: R- foot  Last BM:  06/17/18  Height:   Ht Readings from Last 1 Encounters:  06/18/18 4\' 10"  (1.473 m)    Weight:   Wt Readings from Last 1 Encounters:  06/23/18 73 kg    Ideal Body Weight:  44.7 kg  BMI:  Body mass index is 33.64 kg/m.  Estimated Nutritional Needs:   Kcal:  1450-1600 (20-22 kcals/kg bw)  Protein:  65-80g Pro (1.5-1.8 g/kg bw)  Fluid:  1.4-1.6 L fluid (1 ml/kcal)    Shatoya Roets A. Jimmye Norman, RD, LDN, CDE Pager: [phone] After hours Pager: [phone]

## 2018-06-23 NOTE — Clinical Social Work Note (Signed)
Clinical Social Work Assessment  Patient Details  Name: Kaitlin Vargas MRN: [id number] Date of Birth: [birth year]  Date of referral:  06/23/18               Reason for consult:  Facility Placement, Discharge Planning                Permission sought to share information with:  Chartered certified accountant granted to share information::  Yes, Verbal Permission Granted  Name::        Agency::  SNF's  Relationship::     Contact Information:     Housing/Transportation Living arrangements for the past 2 months:  Single Family Home Source of Information:  Patient, Medical Team Patient Interpreter Needed:  None Criminal Activity/Legal Involvement Pertinent to Current Situation/Hospitalization:  No - Comment as needed Significant Relationships:  Spouse Lives with:  Spouse Do you feel safe going back to the place where you live?  Yes Need for family participation in patient care:  Yes (Comment)  Care giving concerns:  PT recommending CIR once medically stable for discharge. CSW initiating SNF backup plan.   Social Worker assessment / plan:  CSW met with patient. No supports at bedside. CSW introduced role and explained that PT recommendations would be discussed. Discussed potential barriers to CIR. Patient agreeable to SNF if she cannot get into CIR. Provided list of CMS Medicare scores for facilities within 25 miles of her zip code. No further concerns. CSW encouraged patient to contact CSW as needed. CSW will continue to follow patient for support and facilitate discharge to SNF, if needed, once medically stable.  Employment status:  Retired Nurse, adult PT Recommendations:  Inpatient [place] / Referral to community resources:  [place]  Patient/Family's Response to care:  Patient agreeable to SNF if she cannot go to [facility]. Patient's husband supportive and involved in patient's care. Patient appreciated social work  intervention.  Patient/Family's Understanding of and Emotional Response to Diagnosis, Current Treatment, and Prognosis:  Patient has a good understanding of the reason for admission and her need for rehab prior to returning home. Patient appears pleased with hospital care.  Emotional Assessment Appearance:  Appears stated age Attitude/Demeanor/Rapport:  Engaged, Gracious Affect (typically observed):  Accepting, Appropriate, Calm, Pleasant Orientation:  Oriented to Self, Oriented to Place, Oriented to  Time, Oriented to Situation Alcohol / Substance use:  Never Used Psych involvement (Current and /or in the community):  No (Comment)  Discharge Needs  Concerns to be addressed:  Care Coordination Readmission within the last 30 days:  Yes Current discharge risk:  Dependent with Mobility Barriers to Discharge:  Continued Medical Work up, [place], LCSW 06/23/2018, 3:41 PM

## 2018-06-24 LAB — BASIC METABOLIC PANEL
ANION GAP: 10 (ref 5–15)
BUN: 16 mg/dL (ref 8–23)
CHLORIDE: 88 mmol/L — AB (ref 98–111)
CO2: 31 mmol/L (ref 22–32)
Calcium: 8 mg/dL — ABNORMAL LOW (ref 8.9–10.3)
Creatinine, Ser: 0.69 mg/dL (ref 0.44–1.00)
GFR calc Af Amer: >60 mL/min (ref >60)
GFR calc non Af Amer: >60 mL/min (ref >60)
Glucose, Bld: 169 mg/dL — ABNORMAL HIGH (ref 70–99)
POTASSIUM: 3.4 mmol/L — AB (ref 3.5–5.1)
Sodium: 129 mmol/L — ABNORMAL LOW (ref 135–145)

## 2018-06-24 LAB — GLUCOSE, CAPILLARY
GLUCOSE-CAPILLARY: 206 mg/dL — AB (ref 70–99)
GLUCOSE-CAPILLARY: 208 mg/dL — AB (ref 70–99)
Glucose-Capillary: 188 mg/dL — ABNORMAL HIGH (ref 70–99)
Glucose-Capillary: 169 mg/dL — ABNORMAL HIGH (ref 70–99)
Glucose-Capillary: 265 mg/dL — ABNORMAL HIGH (ref 70–99)

## 2018-06-24 MED ORDER — POTASSIUM CHLORIDE CRYS ER 20 MEQ PO TBCR
40.0000 meq | EXTENDED_RELEASE_TABLET | Freq: Once | ORAL | Status: AC
  Administered 2018-06-24: 40 meq via ORAL
  Filled 2018-06-24: qty 2

## 2018-06-24 MED ORDER — FUROSEMIDE 40 MG PO TABS
40.0000 mg | ORAL_TABLET | Freq: Every day | ORAL | Status: DC
  Administered 2018-06-25 – 2018-06-26 (×2): 40 mg via ORAL
  Filled 2018-06-24 (×2): qty 1

## 2018-06-24 NOTE — Progress Notes (Signed)
Patient was given a bed bath, antifungal powder applied under breast due to MASD and barrier cream was applied in between her groins and on her sacrum and a new foam dressing was applied on right foot. She is in bed with no complains

## 2018-06-24 NOTE — Progress Notes (Addendum)
PROGRESS NOTE    Kaitlin Vargas  WIO:[id number] DOB: [birth year] DOA: 06/16/2018 PCP: [name], MD   Brief Narrative: Patient is a 68 year old female who was admitted initially at [facility]  for back pain.  She was discharged from there on 06/10/2019 when she was treated for acute hypoxemic respiratory failure secondary to acute bronchitis.  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  She was also found to have a ulcer on her right foot.  She was transferred to [facility] for IR guided drainage of the abscess..  During the procedure ,she received fentanyl and Versed but she underwent cardiac arrest.  She required CPR of 90 seconds with return of spontaneous circulation.  Did not require intubation.  She was admitted to pulmonary critical care service.  She was transferred to hospitalist service on 06/20/18. Her blood cultures also showed MSSA.  Currently on cefazolin.  No evidence of endocarditis.  ID recommended antibiotics per 6-8 weeks.  Underwent PICC placement .  Planning for discharge to CIR. She  was volume overloaded so started on Lasix. She is medically stable for discharge to inpatient rehab as soon as the bed is available.  Assessment & Plan:   Principal Problem:   MSSA bacteremia Active Problems:   Essential hypertension   Type 2 diabetes mellitus with hyperlipidemia (HCC)   Diskitis   DKA (diabetic ketoacidosis) ([place])   Diabetic foot ulcer ([place])   Cardiac arrest, cause unspecified ([place])   Acute encephalopathy  MSSA bacteremia: ID recommending cefazolin for total of 8 weeks. PlCC placed. Blood cultures sent on 1/29 remain negative.  End date of antibiotic will be on March 25. She was found to have osteomyelitis/discitis of the lumbar spine.  Disc aspiration showed gram-positive cocci on stain.  Urine culture also showed Staphylococcus aureus.  Echocardiogram did not show any vegetation.  Status post  cardiac arrest: While at interventional radiology.  A brief CPR for 90 seconds.  Did not need intubation.  Acute respiratory failure/dyspnea/cough: Chest x-ray  showed pulmonary congestion with findings of CHF.  Echocardiogram done here showed normal left ventricular ejection fraction.  Likely diastolic CHF.  She might have been volume overloaded due to fluid resuscitation after cardiac arrest. She was started  Lasix 40 mg IV every 12 with good response. Improving  bilateral lower extremity edema. Respiratory status stable.Continue lasix 40 mg daily for now.  Poorly controlled diabetes mellitus/DKA: She was found to have DKA on admission.  Continue current insulin regimen.  She was on oral medications at home.  Currently she is on long-acting and sliding scale insulin here.  HbA1C of 8.5 as per 1/18.  Right foot wound: Improving with antibiotics.  Likely associated with diabetic ulcer.  MRI of the right foot did not show any osteomyelitis, fracture or dislocation.   Hypertension: Home medications restarted.BP stable.  Deconditioning/debility: PT evaluated her and recommended CIR on discharge  Hyponatremia/hypokalemia:Likely hypervolemic hyponatremia. Potassium supplemented.  Nutrition Problem: Increased nutrient needs Etiology: wound healing      DVT prophylaxis: Heparin  code Status: Full Family Communication: None present at the bedside Disposition Plan: CIR as soon as the bed is available   Consultants: ID, PCCM  Procedures: None  Antimicrobials:  Anti-infectives (From admission, onward)   Start     Dose/Rate Route Frequency Ordered Stop   06/17/18 1230  ceFAZolin (ANCEF) IVPB 2g/100 mL premix     2 g 200 mL/hr over 30 Minutes Intravenous Every  8 hours 06/17/18 1131     06/16/18 1345  cefTRIAXone (ROCEPHIN) 1 g in sodium chloride 0.9 % 100 mL IVPB     1 g 200 mL/hr over 30 Minutes Intravenous  Once 06/16/18 1340 06/16/18 1614      Subjective: Patient seen and examined  the bedside this morning.  Remains hemodynamically stable.  Still appears  volume overloaded.  Objective: Vitals:   06/24/18 0453 06/24/18 0836 06/24/18 0930 06/24/18 1136  BP: (!) 145/72 123/76  (!) 123/57  Pulse: 88 90  99  Resp: 18  19 16   Temp: 98.7 F (37.1 C)   98.5 F (36.9 C)  TempSrc: Oral   Oral  SpO2: 100%   97%  Weight: 74.2 kg     Height:        Intake/Output Summary (Last 24 hours) at 06/24/2018 1747 Last data filed at 06/24/2018 1600 Gross per 24 hour  Intake 1740 ml  Output 2675 ml  Net -935 ml   Filed Weights   06/22/18 0500 06/23/18 0643 06/24/18 0453  Weight: 72.7 kg 73 kg 74.2 kg    Examination:  General exam: Generalized weakness HEENT:PERRL,Oral mucosa moist, Ear/Nose normal on gross exam Respiratory system: Bilateral decreased air entry on the bases Cardiovascular system: S1 & S2 heard, RRR. No JVD, murmurs, rubs, gallops or clicks. Gastrointestinal system: Abdomen is nondistended, soft and nontender. No organomegaly or masses felt. Normal bowel sounds heard. Central nervous system: Alert and oriented. No focal neurological deficits. Extremities: Trace pedal edema, no clubbing ,no cyanosis, distal peripheral pulses palpable. Skin: Scattered ecchymoses       Data Reviewed: I have personally reviewed following labs and imaging studies  CBC: Recent Labs  Lab 06/18/18 0500 06/19/18 0426 06/20/18 0557  WBC 12.6* 9.6 10.8*  HGB 9.9* 9.2* 9.9*  HCT 31.5* 28.9* 29.5*  MCV 101.9* 100.0 97.7  PLT 429* 388 979   Basic Metabolic Panel: Recent Labs  Lab 06/18/18 0500  06/20/18 0557 06/21/18 0733 06/22/18 0600 06/23/18 0327 06/24/18 0500  NA 132*   < > 129* 129* 129* 130* 129*  K 4.5   < > 3.4* 3.2* 3.6 3.5 3.4*  CL 101   < > 94* 91* 91* 90* 88*  CO2 23   < > 26 25 25 29 31   GLUCOSE 202*   < > 176* 266* 239* 159* 169*  BUN 18   < > 10 12 13 17 16   CREATININE 0.57   < > 0.51 0.66 0.70 0.62 0.69  CALCIUM 8.2*   < > 8.2* 8.1* 7.9* 7.9* 8.0*   MG 1.4*  --  1.6*  --  1.4* 2.0  --   PHOS  --   --  2.6  --   --   --   --    < > = values in this interval not displayed.   GFR: Estimated Creatinine Clearance: 56.8 mL/min (by C-G formula based on SCr of 0.69 mg/dL). Liver Function Tests: No results for input(s): AST, ALT, ALKPHOS, BILITOT, PROT, ALBUMIN in the last 168 hours. No results for input(s): LIPASE, AMYLASE in the last 168 hours. No results for input(s): AMMONIA in the last 168 hours. Coagulation Profile: No results for input(s): INR, PROTIME in the last 168 hours. Cardiac Enzymes: No results for input(s): CKTOTAL, CKMB, CKMBINDEX, TROPONINI in the last 168 hours. BNP (last 3 results) No results for input(s): PROBNP in the last 8760 hours. HbA1C: No results for input(s): HGBA1C in the last 72 hours.  CBG: Recent Labs  Lab 06/23/18 1623 06/23/18 2208 06/24/18 0746 06/24/18 1133 06/24/18 1652  GLUCAP 174* 77 188* 169* 208*   Lipid Profile: No results for input(s): CHOL, HDL, LDLCALC, TRIG, CHOLHDL, LDLDIRECT in the last 72 hours. Thyroid Function Tests: No results for input(s): TSH, T4TOTAL, FREET4, T3FREE, THYROIDAB in the last 72 hours. Anemia Panel: No results for input(s): VITAMINB12, FOLATE, FERRITIN, TIBC, IRON, RETICCTPCT in the last 72 hours. Sepsis Labs: No results for input(s): PROCALCITON, LATICACIDVEN in the last 168 hours.  Recent Results (from the past 240 hour(s))  Urine culture     Status: Abnormal   Collection Time: 06/16/18 12:57 PM  Result Value Ref Range Status   Specimen Description   Final    URINE, CLEAN CATCH Performed at [facility], [address], [place], [place] [address]    Special Requests   Final    NONE Performed at [facility], [address], [place], [place] [address]    Culture (A)  Final    >=100,000 COLONIES/mL STAPHYLOCOCCUS AUREUS >=100,000 COLONIES/mL GRANULICATELLA ADIACENS Standardized susceptibility testing for this organism is not available. Performed at  [facility], [place] [address] [place], [place] [address]    Report Status 06/20/2018 FINAL  Final   Organism ID, Bacteria STAPHYLOCOCCUS AUREUS (A)  Final      Susceptibility   Staphylococcus aureus - MIC*    CIPROFLOXACIN <=0.5 SENSITIVE Sensitive     GENTAMICIN <=0.5 SENSITIVE Sensitive     NITROFURANTOIN <=16 SENSITIVE Sensitive     OXACILLIN <=0.25 SENSITIVE Sensitive     TETRACYCLINE <=1 SENSITIVE Sensitive     VANCOMYCIN <=0.5 SENSITIVE Sensitive     TRIMETH/SULFA <=10 SENSITIVE Sensitive     CLINDAMYCIN <=0.25 SENSITIVE Sensitive     RIFAMPIN <=0.5 SENSITIVE Sensitive     Inducible Clindamycin NEGATIVE Sensitive     * >=100,000 COLONIES/mL STAPHYLOCOCCUS AUREUS  Culture, blood (routine x 2)     Status: Abnormal   Collection Time: 06/16/18  1:53 PM  Result Value Ref Range Status   Specimen Description   Final    LEFT ANTECUBITAL Performed at [facility], [address] [place], [place] [address]    Special Requests   Final    BOTTLES DRAWN AEROBIC ONLY Blood Culture results may not be optimal due to an inadequate volume of blood received in culture bottles Performed at [facility] [address] [place], [place] [address]    Culture  Setup Time   Final    AEROBIC BOTTLE ONLY GRAM POSITIVE COCCI Gram Stain Report Called to,Read Back By and Verified With: J HEARN,RN @0338  06/17/18 [facility] Performed at [facility], [address] [place], [place] [address]    Culture (A)  Final    STAPHYLOCOCCUS AUREUS SUSCEPTIBILITIES PERFORMED ON PREVIOUS CULTURE WITHIN THE LAST 5 DAYS. Performed at [facility], [place] [address], [place], [place] [address]    Report Status 06/19/2018 FINAL  Final  Culture, blood (routine x 2)     Status: Abnormal   Collection Time: 06/16/18  2:51 PM  Result Value Ref Range Status   Specimen Description   Final    BLOOD BLOOD RIGHT WRIST Performed at [facility], [address] [place], [place] [address]    Special Requests   Final     BOTTLES DRAWN AEROBIC AND ANAEROBIC Blood Culture results may not be optimal due to an inadequate volume of blood received in culture bottles Performed at [facility], [place]  [address], [place], [place] [address]    Culture  Setup Time   Final    IN BOTH AEROBIC AND ANAEROBIC BOTTLES GRAM POSITIVE COCCI Gram Stain Report Called to,Read Back By and Verified With: J HEARN,RN @0338  06/17/18 [facility] CRITICAL RESULT CALLED TO, READ BACK BY AND VERIFIED WITH: PHARMD L POOLE [id number] MLM Performed at [facility], [place] [address], [place], [place] [address]    Culture STAPHYLOCOCCUS AUREUS (A)  Final   Report Status 06/19/2018 FINAL  Final   Organism ID, Bacteria STAPHYLOCOCCUS AUREUS  Final      Susceptibility   Staphylococcus aureus - MIC*    CIPROFLOXACIN <=0.5 SENSITIVE Sensitive     ERYTHROMYCIN >=8 RESISTANT Resistant     GENTAMICIN <=0.5 SENSITIVE Sensitive     OXACILLIN <=0.25 SENSITIVE Sensitive     TETRACYCLINE <=1 SENSITIVE Sensitive     VANCOMYCIN <=0.5 SENSITIVE Sensitive     TRIMETH/SULFA <=10 SENSITIVE Sensitive     CLINDAMYCIN <=0.25 SENSITIVE Sensitive     RIFAMPIN <=0.5 SENSITIVE Sensitive     Inducible Clindamycin NEGATIVE Sensitive     * STAPHYLOCOCCUS AUREUS  Blood Culture ID Panel (Reflexed)     Status: Abnormal   Collection Time: 06/16/18  2:51 PM  Result Value Ref Range Status   Enterococcus species NOT DETECTED NOT DETECTED Final   Listeria monocytogenes NOT DETECTED NOT DETECTED Final   Staphylococcus species DETECTED (A) NOT DETECTED Final    Comment: CRITICAL RESULT CALLED TO, READ BACK BY AND VERIFIED WITH: PHARMD L POOLE [id number] MLM    Staphylococcus aureus (BCID) DETECTED (A) NOT DETECTED Final    Comment: Methicillin (oxacillin) susceptible Staphylococcus aureus (MSSA). Preferred therapy is anti staphylococcal beta lactam antibiotic (Cefazolin or Nafcillin), unless clinically contraindicated. CRITICAL RESULT CALLED TO, READ BACK BY AND VERIFIED  WITH: PHARMD L POOLE [phone] MLM    Methicillin resistance NOT DETECTED NOT DETECTED Final   Streptococcus species NOT DETECTED NOT DETECTED Final   Streptococcus agalactiae NOT DETECTED NOT DETECTED Final   Streptococcus pneumoniae NOT DETECTED NOT DETECTED Final   Streptococcus pyogenes NOT DETECTED NOT DETECTED Final   Acinetobacter baumannii NOT DETECTED NOT DETECTED Final   Enterobacteriaceae species NOT DETECTED NOT DETECTED Final   Enterobacter cloacae complex NOT DETECTED NOT DETECTED Final   Escherichia coli NOT DETECTED NOT DETECTED Final   Klebsiella oxytoca NOT DETECTED NOT DETECTED Final   Klebsiella pneumoniae NOT DETECTED NOT DETECTED Final   Proteus species NOT DETECTED NOT DETECTED Final   Serratia marcescens NOT DETECTED NOT DETECTED Final   Haemophilus influenzae NOT DETECTED NOT DETECTED Final   Neisseria meningitidis NOT DETECTED NOT DETECTED Final   Pseudomonas aeruginosa NOT DETECTED NOT DETECTED Final   Candida albicans NOT DETECTED NOT DETECTED Final   Candida glabrata NOT DETECTED NOT DETECTED Final   Candida krusei NOT DETECTED NOT DETECTED Final   Candida parapsilosis NOT DETECTED NOT DETECTED Final   Candida tropicalis NOT DETECTED NOT DETECTED Final    Comment: Performed at [facility], [place]. [address], [place], [place] [address]  MRSA PCR Screening     Status: None   Collection Time: 06/17/18 12:13 PM  Result Value Ref Range Status   MRSA by PCR NEGATIVE NEGATIVE Final    Comment:        The GeneXpert MRSA Assay (FDA approved for NASAL specimens only), is one component of a comprehensive MRSA colonization surveillance program. It is not intended to diagnose MRSA infection nor to guide  or monitor treatment for MRSA infections. Performed at [facility], [place] [address] [place], [place] [address]   Aerobic/Anaerobic Culture (surgical/deep wound)     Status: None   Collection Time: 06/18/18 12:02 PM  Result Value Ref Range Status     Specimen Description VERTEBRA DISC  Final   Special Requests NONE  Final   Gram Stain   Final    ABUNDANT WBC PRESENT, PREDOMINANTLY PMN ABUNDANT GRAM POSITIVE COCCI IN PAIRS IN CLUSTERS    Culture   Final    MODERATE STAPHYLOCOCCUS AUREUS NO ANAEROBES ISOLATED Performed at [facility], [address], [place], [place] [address]    Report Status 06/23/2018 FINAL  Final   Organism ID, Bacteria STAPHYLOCOCCUS AUREUS  Final      Susceptibility   Staphylococcus aureus - MIC*    CIPROFLOXACIN <=0.5 SENSITIVE Sensitive     ERYTHROMYCIN >=8 RESISTANT Resistant     GENTAMICIN <=0.5 SENSITIVE Sensitive     OXACILLIN <=0.25 SENSITIVE Sensitive     TETRACYCLINE <=1 SENSITIVE Sensitive     VANCOMYCIN <=0.5 SENSITIVE Sensitive     TRIMETH/SULFA <=10 SENSITIVE Sensitive     CLINDAMYCIN <=0.25 SENSITIVE Sensitive     RIFAMPIN <=0.5 SENSITIVE Sensitive     Inducible Clindamycin NEGATIVE Sensitive     * MODERATE STAPHYLOCOCCUS AUREUS  Culture, blood (routine x 2)     Status: None   Collection Time: 06/18/18  4:15 PM  Result Value Ref Range Status   Specimen Description BLOOD LEFT ARM  Final   Special Requests   Final    BOTTLES DRAWN AEROBIC ONLY Blood Culture adequate volume   Culture   Final    NO GROWTH 5 DAYS Performed at [facility], [address] [place], [place] [address]    Report Status 06/23/2018 FINAL  Final  Culture, blood (routine x 2)     Status: None   Collection Time: 06/18/18  4:49 PM  Result Value Ref Range Status   Specimen Description BLOOD LEFT ANTECUBITAL  Final   Special Requests   Final    BOTTLES DRAWN AEROBIC AND ANAEROBIC Blood Culture adequate volume   Culture   Final    NO GROWTH 5 DAYS Performed at [facility], [place] [address] [place], [place] [address]    Report Status 06/23/2018 FINAL  Final  Aerobic Culture (superficial specimen)     Status: None   Collection Time: 06/19/18  1:04 PM  Result Value Ref Range Status   Specimen  Description WOUND  Final   Special Requests PERINEAL  Final   Gram Stain   Final    NO WBC SEEN FEW YEAST Performed at [facility], [place] [address], [place], [place] [address]    Culture MODERATE CANDIDA ALBICANS  Final   Report Status 06/23/2018 FINAL  Final         Radiology Studies: No results found.      Scheduled Meds: . aspirin  81 mg Oral Daily  . feeding supplement (GLUCERNA SHAKE)  237 mL Oral TID BM  . feeding supplement (PRO-STAT SUGAR FREE 64)  30 mL Oral BID  . [START ON 06/25/2018] furosemide  40 mg Oral Daily  . heparin injection (subcutaneous)  5,000 Units Subcutaneous Q8H  . insulin aspart  0-15 Units Subcutaneous TID WC  . insulin aspart  0-5 Units Subcutaneous QHS  . insulin glargine  25 Units Subcutaneous Daily  . lisinopril  10 mg Oral  Daily  . methocarbamol  500 mg Oral Q6H  . metoprolol tartrate  25 mg Oral BID  . multivitamin with minerals  1 tablet Oral Daily  . pantoprazole  40 mg Oral Daily  . vitamin B-12  1,000 mcg Oral Daily   Continuous Infusions: . sodium chloride 250 mL (06/20/18 0610)  .  ceFAZolin (ANCEF) IV 2 g (06/24/18 1315)     LOS: 8 days    Time spent: 35 mins.More than 50% of that time was spent in counseling and/or coordination of care.      Shelly Coss, MD [facility] Pager [phone]  If 7PM-7AM, please contact night-coverage [email or web address] Password [facility] 06/24/2018, 5:47 PM

## 2018-06-24 NOTE — Progress Notes (Signed)
Patient is cleaned, peri care and barrier cream was applied to groin, perineum. She is turned on her back. She is dry and purewick is in place.

## 2018-06-24 NOTE — Progress Notes (Signed)
IP rehab admissions - I have faxed OT evaluation and updated PT notes to insurance carrier.  Awaiting call back from insurance case manager regarding potential acute inpatient rehab admission.  Call me for questions.  [phone]

## 2018-06-24 NOTE — Evaluation (Signed)
Occupational Therapy Evaluation Patient Details Name: Kaitlin Vargas MRN: [id number] DOB: 12-06-1950 Today's Date: 06/24/2018    History of Present Illness 68yo female originally admitted to [facility] with back pain, CT found osteomyelitis of the spine and she was transferred to [facility] for a lumbar puncture procedure during which she had active bleeding and suffered cardiac arrest, required CPR for 90 seconds. Foot imaging negative for fracture. PMH PN, HTN, DM    Clinical Impression   PATIENT IS SUPPOSED TO GO TO CIR TODAY. PATIENT HAS DECREASED I AND SAFETY WITH ADLS AND MOBILITY. PATIENT IS MOTIVATED TO WORK WITH OT TO MAXIMIZE FUNCTION. PATIENT IS ABLE TO ASSIST WITH ROLLING AT MIN A LEVEL. PATIENT IS MOD A WITH SUPINE TO SIT AND SIT TO SUPINE. PATIENT WAS ABLE TO SIT EOB AT S TO MIN GUARD ASSIST LEVEL. ACUTE TO FOLLOW IF SHE DOES NOT GO TO CIR TODAY.     Follow Up Recommendations  CIR    Equipment Recommendations       Recommendations for Other Services       Precautions / Restrictions Precautions Precautions: Fall;Back Restrictions Weight Bearing Restrictions: No      Mobility Bed Mobility   Bed Mobility: Sit to Supine Rolling: Min assist Sidelying to sit: Mod assist   Sit to supine: Mod assist      Transfers                      Balance                                           ADL either performed or assessed with clinical judgement   ADL Overall ADL's : Needs assistance/impaired Eating/Feeding: Independent   Grooming: Wash/dry hands;Wash/dry face;Oral care;Set up;Bed level   Upper Body Bathing: Minimal assistance   Lower Body Bathing: Total assistance;Bed level   Upper Body Dressing : Minimal assistance   Lower Body Dressing: Total assistance               Functional mobility during ADLs: (per PT she is a 2 person transfer with steady to stand) General ADL Comments: PATIENT IS REQUIRING EXTENSIVE ASSIST WTIH ADLS  AT THIS TIME.      Vision Baseline Vision/History: Wears glasses Wears Glasses: Reading only Patient Visual Report: No change from baseline       Perception     Praxis      Pertinent Vitals/Pain Pain Assessment: 0-10 Pain Score: 9  Pain Location: back and legs  Pain Descriptors / Indicators: Aching;Sore Pain Intervention(s): Limited activity within patient's tolerance     Hand Dominance     Extremity/Trunk Assessment Upper Extremity Assessment Upper Extremity Assessment: Overall WFL for tasks assessed           Communication Communication Communication: No difficulties   Cognition Arousal/Alertness: Awake/alert Behavior During Therapy: WFL for tasks assessed/performed Overall Cognitive Status: Within Functional Limits for tasks assessed                                     General Comments       Exercises     Shoulder Instructions      Home Living Family/patient expects to be discharged to:: Inpatient rehab  Prior Functioning/Environment Level of Independence: Independent with assistive device(s)                 OT Problem List:        OT Treatment/Interventions: Self-care/ADL training;DME and/or AE instruction;Patient/family education    OT Goals(Current goals can be found in the care plan section) Acute Rehab OT Goals Patient Stated Goal: to go to rehab Potential to Achieve Goals: Good  OT Frequency: Min 2X/week   Barriers to D/C: Decreased caregiver support          Co-evaluation              AM-PAC OT "6 Clicks" Daily Activity     Outcome Measure Help from another person eating meals?: None Help from another person taking care of personal grooming?: A Little Help from another person toileting, which includes using toliet, bedpan, or urinal?: Total Help from another person bathing (including washing, rinsing, drying)?: A Lot Help from another person to put  on and taking off regular upper body clothing?: A Little Help from another person to put on and taking off regular lower body clothing?: Total 6 Click Score: 14   End of Session Nurse Communication: Patient requests pain meds  Activity Tolerance: Patient limited by pain Patient left: in bed;with call bell/phone within reach;with bed alarm set;with family/visitor present                   Time: 5789-7847 OT Time Calculation (min): 42 min Charges:  OT General Charges $OT Visit: 1 Visit OT Evaluation $OT Eval Moderate Complexity: 1 Mod OT Treatments $Self Care/Home Management : 8-41 mins  6 CLICKS  Kaitlin Vargas 06/24/2018, 9:33 AM

## 2018-06-24 NOTE — Care Management Important Message (Signed)
Important Message  Patient Details  Name: Kaitlin Vargas MRN: [id number] Date of Birth: 01/02/1951   Medicare Important Message Given:  Yes    [name] [name] 06/24/2018, 5:00 PM

## 2018-06-25 LAB — BASIC METABOLIC PANEL
Anion gap: 10 (ref 5–15)
BUN: 18 mg/dL (ref 8–23)
CALCIUM: 7.9 mg/dL — AB (ref 8.9–10.3)
CO2: 31 mmol/L (ref 22–32)
Chloride: 89 mmol/L — ABNORMAL LOW (ref 98–111)
Creatinine, Ser: 0.6 mg/dL (ref 0.44–1.00)
GFR calc Af Amer: >60 mL/min (ref >60)
GFR calc non Af Amer: >60 mL/min (ref >60)
Glucose, Bld: 189 mg/dL — ABNORMAL HIGH (ref 70–99)
Potassium: 3.8 mmol/L (ref 3.5–5.1)
SODIUM: 130 mmol/L — AB (ref 135–145)

## 2018-06-25 LAB — GLUCOSE, CAPILLARY
Glucose-Capillary: 215 mg/dL — ABNORMAL HIGH (ref 70–99)
Glucose-Capillary: 178 mg/dL — ABNORMAL HIGH (ref 70–99)
Glucose-Capillary: 239 mg/dL — ABNORMAL HIGH (ref 70–99)
Glucose-Capillary: 137 mg/dL — ABNORMAL HIGH (ref 70–99)

## 2018-06-25 MED ORDER — METHOCARBAMOL 500 MG PO TABS
500.0000 mg | ORAL_TABLET | Freq: Four times a day (QID) | ORAL | Status: DC | PRN
  Administered 2018-06-26: 500 mg via ORAL
  Filled 2018-06-25: qty 1

## 2018-06-25 MED ORDER — HYDROCODONE-ACETAMINOPHEN 10-325 MG PO TABS
1.0000 | ORAL_TABLET | ORAL | Status: DC | PRN
  Administered 2018-06-25 – 2018-06-26 (×4): 1 via ORAL
  Filled 2018-06-25 (×4): qty 1

## 2018-06-25 NOTE — Progress Notes (Signed)
Pt assisted up to sink for grooming and to chair using stedy and 2 person assist for sit to stand. Pt demonstrating very slow processing and direction following. Continues to be appropriate for intensive rehab in CIR.  06/25/18 1200  OT Visit Information  Last OT Received On 06/25/18  Assistance Needed +2  PT/OT/SLP Co-Evaluation/Treatment Yes  Reason for Co-Treatment For patient/therapist safety  OT goals addressed during session ADL's and self-care  History of Present Illness Pt is a 68 y.o. female admitted 06/16/18 with back pain. 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 Transferred to [facility] for lumbar puncture on 1/29; during procedure, pt with active bleeding and cardiac arrest requiring CPR. PMH includes peripheral neuropathy, HTN, DM.  Precautions  Precautions Fall;Back  Precaution Comments back precautions for comfort  Pain Assessment  Pain Assessment 0-10  Pain Score 9  Pain Location back and legs   Pain Descriptors / Indicators Aching;Sore  Pain Intervention(s) Monitored during session;Repositioned  Cognition  Arousal/Alertness Awake/alert  Behavior During Therapy Flat affect  Overall Cognitive Status Impaired/Different from baseline  Area of Impairment Following commands;Problem solving  Following Commands Follows one step commands with increased time  Problem Solving Slow processing;Decreased initiation;Difficulty sequencing;Requires verbal cues;Requires tactile cues  Upper Extremity Assessment  Upper Extremity Assessment Generalized weakness  ADL  Overall ADL's  Needs assistance/impaired  Grooming Wash/dry hands;Oral care;Brushing hair;Standing;Set up;Maximal assistance (with stedy at sink)  Grooming Details (indicate cue type and reason) assist needed for brushing hair  Upper Body Dressing  Minimal assistance;Sitting  Upper Body Dressing Details (indicate cue type and reason) front opening gown  Lower Body Dressing Total assistance  Lower Body Dressing Details (indicate  cue type and reason) for mesh underwear with 3 pads  Toileting- Clothing Manipulation and Hygiene Total assistance;+2 for physical assistance;Sit to/from stand  General ADL Comments pt with constant urinary incontinence  Bed Mobility  Overal bed mobility Needs Assistance  Bed Mobility Rolling;Sidelying to Sit  Rolling Min assist  Sidelying to sit Mod assist  General bed mobility comments cues for log roll technique, increased time, assist to raise trunk  Balance  Overall balance assessment Needs assistance  Sitting balance-Leahy Scale Fair  Standing balance-Leahy Scale Poor  Standing balance comment Relies on Bil UE support and external support of 2 persons up to mod assist with better standing balance today  Transfers  Overall transfer level Needs assistance  Transfer via Lift Equipment Stedy  Transfers Sit to/from [facility]  Sit to Stand +2 physical assistance;Mod assist;From elevated surface  Stand pivot transfers Max assist;+2 physical assistance  General transfer comment Pt was able to stand to [place] with mod assist for support.  Pt needed to be cleaned and nurse present and cleaned pt.  Pt did not buckle today and was able to safely sit on STedy and be moved to chair. STood 2 more times for about a minute each time.     OT - End of Session  Equipment Utilized During Treatment Gait belt (stedy)  Activity Tolerance Patient tolerated treatment well  Patient left in chair;with call bell/phone within reach;with chair alarm set;with family/visitor present  Nurse Communication Mobility status  OT Assessment/Plan  OT Plan Discharge plan remains appropriate  OT Visit Diagnosis Unsteadiness on feet (R26.81);Pain;Other symptoms and signs involving cognitive function;Muscle weakness (generalized) (M62.81)  OT Frequency (ACUTE ONLY) Min 2X/week  Follow Up Recommendations CIR  AM-PAC OT "6 Clicks" Daily Activity Outcome Measure (Version 2)  Help from another person eating  meals? 4  Help from another  person taking care of personal grooming? 2  Help from another person toileting, which includes using toliet, bedpan, or urinal? 1  Help from another person bathing (including washing, rinsing, drying)? 2  Help from another person to put on and taking off regular upper body clothing? 2  Help from another person to put on and taking off regular lower body clothing? 1  6 Click Score 12  OT Goal Progression  Progress towards OT goals Progressing toward goals  Acute Rehab OT Goals  Patient Stated Goal to go to rehab  Potential to Achieve Goals Good  OT Time Calculation  OT Start Time (ACUTE ONLY) 1034  OT Stop Time (ACUTE ONLY) 1104  OT Time Calculation (min) 30 min  OT General Charges  $OT Visit 1 Visit  OT Treatments  $Self Care/Home Management  8-22 mins  Nestor Lewandowsky, OTR/L Acute Rehabilitation Services Pager: [phone] Office: [phone]

## 2018-06-25 NOTE — Progress Notes (Signed)
CM ;following for progression of care; for possible Inpt Rehab admission. Mindi Slicker [facility] [phone]

## 2018-06-25 NOTE — PMR Pre-admission (Signed)
PMR Admission Coordinator Pre-Admission Assessment  Patient: Kaitlin Vargas is an 68 y.o., female MRN: [id number] DOB: 18-Feb-1951 Height: 4\' 10"  (147.3 cm) Weight: 74.4 kg              Insurance Information HMO: yes    PPO:      PCP:      IPA:      80/20:      OTHER: Medicare advantage plan PRIMARY: [employer]      Policy#: [id number]      Subscriber: pt CM Name: Shirlean Mylar      Phone#: [phone] ext [phone]     Fax#: [phone] Pre-Cert#: [id number] approved for 7 days with f/u Joslyn Devon ext [phone] fax; Same      Employer: disabled Benefits:  Phone #: [phone]     Name:  Eff. Date: 05/21/2018     Deduct: none      Out of Pocket Max: $3400      Life Max: none CIR: $295 co pay per day days 1 until 6      SNF: no co pay per day days 1 until 20; $178 co pay per day days 21 until 100 Outpatient: $40 co pay per visit     Co-Pay: visits per medical neccesity Home Health: 100%      Co-Pay: visits per medical neccesity DME: 80%     Co-Pay: 20% Providers: in network  SECONDARY: none     Medicaid Application Date:       Case Manager:  Disability Application Date:       Case Worker:   Emergency Facilities manager Information    Name Relation Home Work [place], [place] Spouse   [phone]     Current Medical History  Patient Admitting Diagnosis: L 1 -2 osteomyelitis, right foot ulceration  History of Present Illness: Lisseth Brazeau is a 68 year old right handed female history of hypertension, hyperlipidemia, B12 deficiency and diabetes mellitus. Patient was reported back pain 4 weeks.  She recently came to the hospital with cough was diagnosed with adenovirus in mid January received antibiotic coverage recovering nicely and was discharged to home. She continued to have back pain radiating to the lower extremities as well as bouts of urinary incontinence and stool incontinence. Present [facility] hospital 06/16/2018 with increasing back pain and a CT scan of the spine showed  what appeared to be osteomyelitis. She was transferred to [facility] interventional radiology for lumbar puncture and during procedure noted increase active bleeding as well as cardiac arrest requiring CPR 90 seconds. She did not require intubation. He was felt cardiac arrest likely due to respiratory arrest following sedation. 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 Patient also with right foot ulceration. MRI of the foot negative for acute bony or joint abnormality. Small volume of fluid in the subcutaneous tissue deep to the first TMT joint. Infectious disease consulted patient with GPCs growing from lumbar aspirate. Placed on antibiotic therapy. TEE negative for vegetation. Current plans to continue cefazolin 6-8 weeks with in date of 08/13/2018. Subcutaneous heparin for DVT prophylaxis. Hospital course ongoing pain management. Acute on chronic anemia latest hemoglobin 9.9. Bouts of hyponatremia 130 and monitored.   Past Medical History  Past Medical History:  Diagnosis Date  . Anemia   . Diabetes mellitus without complication ([place])   . Hyperlipidemia   . Hypertension   . Peripheral neuropathy     Family  History  family history includes Cancer in her father and mother; Leukemia in her father; Stroke in her mother.  Prior Rehab/Hospitalizations:  Has the patient had major surgery during 100 days prior to admission? No  Current Medications   Current Facility-Administered Medications:  .  0.9 %  sodium chloride infusion, , Intravenous, PRN, Rush Farmer, MD, Last Rate: 10 mL/hr at 06/26/18 0900 .  albuterol (PROVENTIL) (2.5 MG/3ML) 0.083% nebulizer solution 2.5 mg, 2.5 mg, Nebulization, Q2H PRN, Karmen Bongo, MD .  aspirin chewable tablet 81 mg, 81 mg, Oral, Daily, Karmen Bongo, MD, 81 mg at 06/26/18 1020 .  ceFAZolin (ANCEF) IVPB 2g/100 mL premix, 2 g, Intravenous, Q8H, Susa Raring, RPH, Last Rate: 200 mL/hr at 06/26/18 0534, 2 g at 06/26/18 0534 .  feeding supplement (GLUCERNA SHAKE) (GLUCERNA SHAKE) liquid 237 mL, 237 mL, Oral, TID BM, Adhikari, Amrit, MD, 237 mL at 06/26/18 1021 .  feeding supplement (PRO-STAT SUGAR FREE 64) liquid 30 mL, 30 mL, Oral, BID, Memon, Jehanzeb, MD, 30 mL at 06/26/18 1022 .  fentaNYL (SUBLIMAZE) injection 25-50 mcg, 25-50 mcg, Intravenous, Q2H PRN, Desai, Rahul P, PA-C, 50 mcg at 06/25/18 0900 .  furosemide (LASIX) tablet 40 mg, 40 mg, Oral, Daily, Adhikari, Amrit, MD, 40 mg at 06/26/18 1021 .  heparin injection 5,000 Units, 5,000 Units, Subcutaneous, Q8H, Shelly Coss, MD, 5,000 Units at 06/26/18 0534 .  HYDROcodone-acetaminophen (NORCO) 10-325 MG per tablet 1 tablet, 1 tablet, Oral, Q4H PRN, Shelly Coss, MD, 1 tablet at 06/26/18 1017 .  insulin aspart (novoLOG) injection 0-15 Units, 0-15 Units, Subcutaneous, TID WC, Kathie Dike, MD, 5 Units at 06/25/18 1734 .  insulin aspart (novoLOG) injection 0-5 Units, 0-5 Units, Subcutaneous, QHS, Kathie Dike, MD, 3 Units at 06/24/18 2226 .  insulin glargine (LANTUS) injection 25 Units, 25 Units, Subcutaneous, Daily, Shelly Coss, MD, 25 Units at 06/26/18 1022 .  lisinopril (PRINIVIL,ZESTRIL) tablet 10 mg, 10 mg, Oral, Daily, Karmen Bongo, MD, 10 mg at 06/26/18 1019 .  methocarbamol (ROBAXIN) tablet 500 mg, 500 mg, Oral, Q6H PRN, Shelly Coss, MD, 500 mg at 06/26/18 0844 .  metoprolol tartrate (LOPRESSOR) tablet 25 mg, 25 mg, Oral, BID, Memon, Jehanzeb, MD, 25 mg at 06/26/18 1019 .  multivitamin with minerals tablet 1 tablet, 1 tablet, Oral, Daily, Adhikari, Amrit, MD, 1 tablet at 06/26/18 1020 .  ondansetron (ZOFRAN) injection 4 mg, 4 mg, Intravenous, Q6H PRN, Karmen Bongo, MD, 4 mg at 06/18/18 0947 .  pantoprazole (PROTONIX) EC tablet 40 mg, 40 mg, Oral, Daily, Karmen Bongo, MD, 40 mg at 06/26/18 1020 .  sodium chloride flush (NS) 0.9 % injection 10-40 mL, 10-40 mL,  Intracatheter, PRN, Tawanna Solo, Amrit, MD, 20 mL at 06/26/18 0330 .  vitamin B-12 (CYANOCOBALAMIN) tablet 1,000 mcg, 1,000 mcg, Oral, Daily, Karmen Bongo, MD, 1,000 mcg at 06/26/18 1020  Patients Current Diet:  Diet Order            Diet - low sodium heart healthy        Diet Carb Modified Fluid consistency: Thin; Room service appropriate? Yes  Diet effective now              Precautions / Restrictions Precautions Precautions: Fall, Back Precaution Comments: back precautions for comfort Restrictions Weight Bearing Restrictions: No   Has the patient had 2 or more falls or a fall with injury in the past year?No  Prior Activity Level Community (5-7x/wk): was not driving pta due to vision issues. Used RW and/or cane as  needed due to peripheral neuropathy  Home Assistive Devices / Equipment Home Assistive Devices/Equipment: Environmental consultant (specify type), CBG Meter, Cane (specify quad or straight), Eyeglasses, Dentures (specify type), Shower chair with back, Bedside commode/3-in-1 Home Equipment: Walker - 2 wheels, Cane - single point  Prior Device Use: Indicate devices/aids used by the patient prior to current illness, exacerbation or injury? Walker  Prior Functional Level Prior Function Level of Independence: Independent with assistive device(s)  Self Care: Did the patient need help bathing, dressing, using the toilet or eating?  Independent  Indoor Mobility: Did the patient need assistance with walking from room to room (with or without device)? Independent  Stairs: Did the patient need assistance with internal or external stairs (with or without device)? Independent  Functional Cognition: Did the patient need help planning regular tasks such as shopping or remembering to take medications? Independent  Current Functional Level Cognition  Overall Cognitive Status: Impaired/Different from baseline Orientation Level: Oriented X4 Following Commands: Follows one step commands with  increased time    Extremity Assessment (includes Sensation/Coordination)  Upper Extremity Assessment: Generalized weakness  Lower Extremity Assessment: Generalized weakness    ADLs  Overall ADL's : Needs assistance/impaired Eating/Feeding: Independent Grooming: Wash/dry hands, Oral care, Brushing hair, Standing, Set up, Maximal assistance(with stedy at sink) Grooming Details (indicate cue type and reason): assist needed for brushing hair Upper Body Bathing: Minimal assistance Lower Body Bathing: Total assistance, Bed level Upper Body Dressing : Minimal assistance, Sitting Upper Body Dressing Details (indicate cue type and reason): front opening gown Lower Body Dressing: Total assistance Lower Body Dressing Details (indicate cue type and reason): for mesh underwear with 3 pads Toileting- Clothing Manipulation and Hygiene: Total assistance, +2 for physical assistance, Sit to/from stand Functional mobility during ADLs: (per PT she is a 2 person transfer with steady to stand) General ADL Comments: pt with constant urinary incontinence    Mobility  Overal bed mobility: Needs Assistance Bed Mobility: Rolling, Sidelying to Sit Rolling: Min assist Sidelying to sit: Mod assist Sit to supine: Mod assist General bed mobility comments: cues for log roll technique, increased time, assist to raise trunk    Transfers  Overall transfer level: Needs assistance Equipment used: Rolling walker (2 wheeled) Transfer via Lift Equipment: Stedy Transfers: Sit to/from Stand, Risk manager Sit to Stand: +2 physical assistance, Mod assist, From elevated surface Stand pivot transfers: Max assist, +2 physical assistance General transfer comment: Pt was able to stand to [place] with mod assist for support.  Pt needed to be cleaned and nurse present and cleaned pt.  Pt did not buckle today and was able to safely sit on STedy and be moved to chair. STood 2 more times for about a minute each time.         Ambulation / Gait / Stairs / Wheelchair Mobility  Ambulation/Gait General Gait Details: deferred, will require +2 for safety and incontinent with standing     Posture / Balance Balance Overall balance assessment: Needs assistance Sitting-balance support: Bilateral upper extremity supported, Feet supported Sitting balance-Leahy Scale: Fair Standing balance support: Bilateral upper extremity supported, During functional activity Standing balance-Leahy Scale: Poor Standing balance comment: Relies on Bil UE support and external support of 2 persons up to mod assist with better standing balance today    Special needs/care consideration BiPAP/CPAP n/a CPM n/a Continuous Drip IV PICC placed this admit Dialysis n/a Life Vest n/a Oxygen n/a Special Bed n/a Trach Size n/a Wound Vac n/a Skin   Liane Comber, Melody R,  RN  Registered Nurse  WOC  Consult Note  Signed  Date of Service:  06/17/2018 11:04 AM          Signed         Show:Clear all [x] Manual[x] Template[] Copied  Added by: [x] Austin, Benjaman Lobe, RN  [] Hover for details [place] Nurse wound consult note completed by use of Elink and assistance of bedside nurse Reason for Consult: foot wound  Wound type:neuropathic foot ulceration in the presence of DM Xray did not indicate osteomyelitis, MD to add MRI if needed.  Pressure Injury POA: NA Measurement: 3cm x 0.5cm x 0cm  Wound bed: scabbed area with hyperkeratotic skin along the proximal edge of the wound bed Drainage (amount, consistency, odor) none Peri wound: edema, circumferential erythema, requested bedside nurse to mark current status of erythema    Dressing procedure/placement/frequency: No real topical therapy needed. Requested bedside nurse to protect area with silicone foam. Would suggest follow up with Dr. [id number] (she is followed for podiatry needs) as an outpatient.   Discussed POC with patient and bedside nurse.  Re consult if needed, will not follow at this  time. Thanks  Melody Irving Shows MSN, RN,CWOCN, CNS, CWON-AP [phone])              Moisture associated skin damage to perineum and groins; ecchymosis to arms; fissure to anus; moisture associated skin damage to breast; rash to genitalia Bowel mgmt: LBM 2/5 incontinent Bladder mgmt: external catheter Diabetic mgmt Hgb A1c 8.5 on 06/07/2018 Cefazolin via PICC with Last day 08/13/2018    Previous Home Environment Living Arrangements: Spouse/significant other(and daughter, 08/26/2018)  Lives With: Spouse, Daughter Available Help at Discharge: Family, Available 24 hours/day Type of Home: House Home Layout: One level Home Access: Ramped entrance Bathroom Shower/Tub: [name]: Standard Bathroom Accessibility: Yes How Accessible: Accessible via walker Home Care Services: No Additional Comments: able to ambualte with RW and SPC prior to admission   Discharge Living Setting Plans for Discharge Living Setting: Patient's home, Lives with (comment)(spouse and daughter) Type of Home at Discharge: House Discharge Home Layout: One level Discharge Home Access: [place] entrance Discharge Bathroom Shower/Tub: Tub/shower unit, Curtain Discharge Bathroom Toilet: Standard Discharge Bathroom Accessibility: Yes How Accessible: Accessible via walker Does the patient have any problems obtaining your medications?: No  Social/Family/Support Systems Patient Roles: Spouse, Parent Contact Information: spouse,  John Anticipated Caregiver: spouse and daughter Anticipated Caregiver's Contact Information: spouse cell [phone] Ability/Limitations of Caregiver: no limitations Caregiver Availability: 24/7 Discharge Plan Discussed with Primary Caregiver: Yes Is Caregiver In Agreement with Plan?: Yes Does Caregiver/Family have Issues with Lodging/Transportation while Pt is in Rehab?: No  Goals/Additional Needs Patient/Family Goal for Rehab: supervision to min asisst with PT and  OT Expected length of stay: ELOS 12 to 20 days Equipment Needs: IV antibiotics at d/c via PICC Pt/Family Agrees to Admission and willing to participate: Yes Program Orientation Provided & Reviewed with Pt/Caregiver Including Roles  & Responsibilities: Yes  Decrease burden of Care through IP rehab admission: n/a  Possible need for SNF placement upon discharge: not anticipated  Patient Condition: This patient's medical and functional status has changed since the consult dated 06/20/2018 in which the Rehabilitation Physician determined and documented that the patient was potentially appropriate for intensive rehabilitative care in an inpatient rehabilitation facility. Issues have been addressed and update has been discussed with Dr. 07/03/2018  and patient now appropriate for inpatient rehabilitation. Will admit to inpatient rehab today.   Preadmission Screen Completed By:  Letta Pate  RN MSN, 06/26/2018 10:51 AM ______________________________________________________________________   Discussed status with Dr. Letta Pate on 06/26/2018 at  1051 and received telephone approval for admission today.  Admission Coordinator:  Cleatrice Burke RN MSN, time 1051 Date 06/26/2018

## 2018-06-25 NOTE — Progress Notes (Signed)
Physical Therapy Treatment Patient Details Name: Kaitlin Vargas MRN: [id number] DOB: 03/24/1951 Today's Date: 06/25/2018    History of Present Illness Pt is a 68 y.o. female admitted 06/16/18 with back pain. 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 Transferred to [facility] for lumbar puncture on 1/29; during procedure, pt with active bleeding and cardiac arrest requiring CPR. PMH includes peripheral neuropathy, HTN, DM.    PT Comments    Pt admitted with above diagnosis. Pt currently with functional limitations due to balance and endurance deficits. Pt was able to tolerate standing to [place] x 2 times and perform ADL sitting on [place]. Fatigued at end of treatment but continues to progress slowly.  LEs are stronger.   Pt will benefit from skilled PT to increase their independence and safety with mobility to allow discharge to the venue listed below.     Follow Up Recommendations  CIR     Equipment Recommendations  Other (comment)(TBD )    Recommendations for Other Services       Precautions / Restrictions Precautions Precautions: Fall;Back Precaution Comments: back precautions for comfort Restrictions Weight Bearing Restrictions: No    Mobility  Bed Mobility Overal bed mobility: Needs Assistance Bed Mobility: Rolling;Sidelying to Sit Rolling: Min assist Sidelying to sit: Mod assist   Sit to supine: Mod assist   General bed mobility comments: cues for log roll technique, increased time, assist to raise trunk  Transfers Overall transfer level: Needs assistance Equipment used: Rolling walker (2 wheeled) Transfers: Sit to/from [facility] Sit to Stand: +2 physical assistance;Mod assist;From elevated surface Stand pivot transfers: Max assist;+2 physical assistance       General transfer comment: Pt was able to stand to [place] with mod assist for support.  Pt needed to be cleaned and nurse present and cleaned pt.  Pt did not buckle today and was able to safely sit on STedy  and be moved to chair. STood 2 more times for about a minute each time.     Ambulation/Gait             General Gait Details: deferred, will require +2 for safety and incontinent with standing    Stairs             Wheelchair Mobility    Modified Rankin (Stroke Patients Only)       Balance Overall balance assessment: Needs assistance Sitting-balance support: Bilateral upper extremity supported;Feet supported Sitting balance-Leahy Scale: Fair     Standing balance support: Bilateral upper extremity supported;During functional activity Standing balance-Leahy Scale: Poor Standing balance comment: Relies on Bil UE support and external support of 2 persons up to mod assist with better standing balance today                            Cognition Arousal/Alertness: Awake/alert Behavior During Therapy: Flat affect Overall Cognitive Status: Impaired/Different from baseline Area of Impairment: Following commands;Problem solving                       Following Commands: Follows one step commands with increased time     Problem Solving: Slow processing;Decreased initiation;Difficulty sequencing;Requires verbal cues;Requires tactile cues        Exercises General Exercises - Lower Extremity Long Arc Quad: AROM;Both;15 reps;Seated Hip Flexion/Marching: AROM;Both;10 reps;Seated    General Comments General comments (skin integrity, edema, etc.): NT replacing the purewick as PT left.       Pertinent Vitals/Pain Pain Assessment: 0-10 Pain  Score: 9  Faces Pain Scale: Hurts little more Pain Location: back and legs  Pain Descriptors / Indicators: Aching;Sore Pain Intervention(s): Monitored during session;Repositioned    Home Living                      Prior Function            PT Goals (current goals can now be found in the care plan section) Acute Rehab PT Goals Patient Stated Goal: to go to rehab PT Goal Formulation: With  patient/family Time For Goal Achievement: 07/03/18 Potential to Achieve Goals: Fair Progress towards PT goals: Progressing toward goals    Frequency    Min 3X/week      PT Plan Current plan remains appropriate    Co-evaluation   Reason for Co-Treatment: For patient/therapist safety   OT goals addressed during session: ADL's and self-care      AM-PAC PT "6 Clicks" Mobility   Outcome Measure  Help needed turning from your back to your side while in a flat bed without using bedrails?: A Lot Help needed moving from lying on your back to sitting on the side of a flat bed without using bedrails?: A Lot Help needed moving to and from a bed to a chair (including a wheelchair)?: A Lot Help needed standing up from a chair using your arms (e.g., wheelchair or bedside chair)?: A Lot Help needed to walk in hospital room?: Total Help needed climbing 3-5 steps with a railing? : Total 6 Click Score: 10    End of Session Equipment Utilized During Treatment: Gait belt Activity Tolerance: Patient limited by fatigue;Patient limited by pain Patient left: in chair;with call bell/phone within reach;with chair alarm set Nurse Communication: Mobility status;Need for lift equipment PT Visit Diagnosis: Unsteadiness on feet (R26.81);Difficulty in walking, not elsewhere classified (R26.2);Muscle weakness (generalized) (M62.81)     Time: 1610-9604 PT Time Calculation (min) (ACUTE ONLY): 30 min  Charges:  $Therapeutic Activity: 8-22 mins                     [place] Pager:  [phone]  Office:  [place] 06/25/2018, 12:51 PM

## 2018-06-25 NOTE — Progress Notes (Addendum)
Inpatient Rehabilitation Admissions Coordinator  I met with pt, spouse and daughter at bedside. We discussed goals and expectations of an inpt rehab admit. Pt reports pain limiting her ability to tolerate more intense therapies at this time. Pt receiving Robaxin q 6 and Fentanyl IV prn. She is requesting longer acting prn po pain meds. I contacted Dr. Tawanna Solo to request change in pain management meds. I will then follow up with patient tomorrow to assess for timing of an inpt rehab admit.  Danne Baxter, RN, MSN Rehab Admissions Coordinator [phone] 06/25/2018 10:46 AM  I spoke with pt's RN to update her on plans for pain management for better control before proceeding with possible admit to CIR tomorrow pending pain control .  Danne Baxter, RN, MSN Rehab Admissions Coordinator [phone] 06/25/2018 11:47 AM

## 2018-06-25 NOTE — Progress Notes (Signed)
PROGRESS NOTE    Kaitlin Vargas  WLN:[id number] DOB: [birth year] DOA: 06/16/2018 PCP: [name], MD   Brief Narrative: Patient is a 68 year old female who was admitted initially at [facility]  for back pain.  She was discharged from there on 06/10/2019 when she was treated for acute hypoxemic respiratory failure secondary to acute bronchitis.  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  She was also found to have a ulcer on her right foot.  She was transferred to [facility] for IR guided drainage of the abscess..  During the procedure ,she received fentanyl and Versed but she underwent cardiac arrest.  She required CPR of 90 seconds with return of spontaneous circulation.  Did not require intubation.  She was admitted to pulmonary critical care service.  She was transferred to hospitalist service on 06/20/18. Her blood cultures also showed MSSA.  Currently on cefazolin.  No evidence of endocarditis.  ID recommended antibiotics per 6-8 weeks.  Underwent PICC placement .  Planning for discharge to CIR. She  was volume overloaded so started on Lasix. She is medically stable for discharge to inpatient rehab as soon as the bed is available.Plan is to transfer her to CIR tomorrow as per rehab coordinator.  Assessment & Plan:   Principal Problem:   MSSA bacteremia Active Problems:   Essential hypertension   Type 2 diabetes mellitus with hyperlipidemia (HCC)   Diskitis   DKA (diabetic ketoacidosis) ([place])   Diabetic foot ulcer ([place])   Cardiac arrest, cause unspecified ([place])   Acute encephalopathy  MSSA bacteremia: ID recommending cefazolin for total of 8 weeks. PlCC placed. Blood cultures sent on 1/29 remain negative.  End date of antibiotic will be on March 25. She was found to have osteomyelitis/discitis of the lumbar spine.  Disc aspiration showed gram-positive cocci on stain.  Urine culture also showed Staphylococcus  aureus.  Echocardiogram did not show any vegetation.  Status post cardiac arrest: While at interventional radiology.  A brief CPR for 90 seconds.  Did not need intubation.  Severe back pain: Secondary from osteomyelitis/discitis of the lumbar spine.  Continue pain management.  Acute respiratory failure/dyspnea/cough: Chest x-ray  showed pulmonary congestion with findings of CHF.  Echocardiogram done here showed normal left ventricular ejection fraction.  Likely diastolic CHF.  She might have been volume overloaded due to fluid resuscitation after cardiac arrest. She was started  Lasix 40 mg IV every 12 with good response. Improving  bilateral lower extremity edema. Respiratory status stable.Continue lasix 40 mg daily for now.  Poorly controlled diabetes mellitus/DKA: She was found to have DKA on admission.  Continue current insulin regimen.  She was on oral medications at home.  Currently she is on long-acting and sliding scale insulin here.  HbA1C of 8.5 as per 1/18.She might need to be continued on insulin on discharge to home.  Right foot wound: Improving with antibiotics.  Likely associated with diabetic ulcer.  MRI of the right foot did not show any osteomyelitis, fracture or dislocation.   Hypertension: Home medications restarted.BP stable.  Deconditioning/debility: PT evaluated her and recommended CIR on discharge  Hyponatremia/hypokalemia:Likely hypervolemic hyponatremia. Potassium supplemented.Anticipate improvement with diuresis.  Nutrition Problem: Increased nutrient needs Etiology: wound healing      DVT prophylaxis: Heparin [place]  code Status: Full Family Communication: None present at the bedside Disposition Plan: CIR as soon as possible   Consultants: ID, PCCM  Procedures: None  Antimicrobials:  Anti-infectives (From  admission, onward)   Start     Dose/Rate Route Frequency Ordered Stop   06/17/18 1230  ceFAZolin (ANCEF) IVPB 2g/100 mL premix     2 g 200 mL/hr over  30 Minutes Intravenous Every 8 hours 06/17/18 1131     06/16/18 1345  cefTRIAXone (ROCEPHIN) 1 g in sodium chloride 0.9 % 100 mL IVPB     1 g 200 mL/hr over 30 Minutes Intravenous  Once 06/16/18 1340 06/16/18 1614      Subjective: Patient seen and examined the bedside this morning.  Remains hemodynamically stable.  Complains of back pain.  Still appears significantly weak.  Objective: Vitals:   06/24/18 0930 06/24/18 1136 06/24/18 2050 06/25/18 0530  BP:  (!) 123/57 133/63 124/65  Pulse:  99 (!) 102 94  Resp: 19 16 18 20   Temp:  98.5 F (36.9 C) 99.2 F (37.3 C) 97.8 F (36.6 C)  TempSrc:  Oral Oral Oral  SpO2:  97% 97% 96%  Weight:    74.9 kg  Height:        Intake/Output Summary (Last 24 hours) at 06/25/2018 1044 Last data filed at 06/25/2018 1013 Gross per 24 hour  Intake 800 ml  Output 925 ml  Net -125 ml   Filed Weights   06/23/18 0643 06/24/18 0453 06/25/18 0530  Weight: 73 kg 74.2 kg 74.9 kg    Examination:  General exam: Not in distress, generalized weakness HEENT:PERRL,Oral mucosa moist, Ear/Nose normal on gross exam Respiratory system: Bilateral decreased air entry on the bases Cardiovascular system: S1 & S2 heard, RRR. No JVD, murmurs, rubs, gallops or clicks. Gastrointestinal system: Abdomen is nondistended, soft and nontender. No organomegaly or masses felt. Normal bowel sounds heard. Central nervous system: Alert and oriented. No focal neurological deficits. Extremities: Trace pedal edema, no clubbing ,no cyanosis, distal peripheral pulses palpable. Skin: Scattered ecchymosis    Data Reviewed: I have personally reviewed following labs and imaging studies  CBC: Recent Labs  Lab 06/19/18 0426 06/20/18 0557  WBC 9.6 10.8*  HGB 9.2* 9.9*  HCT 28.9* 29.5*  MCV 100.0 97.7  PLT 388 580   Basic Metabolic Panel: Recent Labs  Lab 06/20/18 0557 06/21/18 0733 06/22/18 0600 06/23/18 0327 06/24/18 0500 06/25/18 0326  NA 129* 129* 129* 130* 129* 130*   K 3.4* 3.2* 3.6 3.5 3.4* 3.8  CL 94* 91* 91* 90* 88* 89*  CO2 26 25 25 29 31 31   GLUCOSE 176* 266* 239* 159* 169* 189*  BUN 10 12 13 17 16 18   CREATININE 0.51 0.66 0.70 0.62 0.69 0.60  CALCIUM 8.2* 8.1* 7.9* 7.9* 8.0* 7.9*  MG 1.6*  --  1.4* 2.0  --   --   PHOS 2.6  --   --   --   --   --    GFR: Estimated Creatinine Clearance: 57.1 mL/min (by C-G formula based on SCr of 0.6 mg/dL). Liver Function Tests: No results for input(s): AST, ALT, ALKPHOS, BILITOT, PROT, ALBUMIN in the last 168 hours. No results for input(s): LIPASE, AMYLASE in the last 168 hours. No results for input(s): AMMONIA in the last 168 hours. Coagulation Profile: No results for input(s): INR, PROTIME in the last 168 hours. Cardiac Enzymes: No results for input(s): CKTOTAL, CKMB, CKMBINDEX, TROPONINI in the last 168 hours. BNP (last 3 results) No results for input(s): PROBNP in the last 8760 hours. HbA1C: No results for input(s): HGBA1C in the last 72 hours. CBG: Recent Labs  Lab 06/24/18 0746 06/24/18 1133 06/24/18  1652 06/24/18 2219 06/25/18 0821  GLUCAP 188* 169* 208* 265* 215*   Lipid Profile: No results for input(s): CHOL, HDL, LDLCALC, TRIG, CHOLHDL, LDLDIRECT in the last 72 hours. Thyroid Function Tests: No results for input(s): TSH, T4TOTAL, FREET4, T3FREE, THYROIDAB in the last 72 hours. Anemia Panel: No results for input(s): VITAMINB12, FOLATE, FERRITIN, TIBC, IRON, RETICCTPCT in the last 72 hours. Sepsis Labs: No results for input(s): PROCALCITON, LATICACIDVEN in the last 168 hours.  Recent Results (from the past 240 hour(s))  Urine culture     Status: Abnormal   Collection Time: 06/16/18 12:57 PM  Result Value Ref Range Status   Specimen Description   Final    URINE, CLEAN CATCH Performed at [facility] [address], [place], [place] [address]    Special Requests   Final    NONE Performed at [facility] [address], [place], [place] [address]    Culture (A)  Final     >=100,000 COLONIES/mL STAPHYLOCOCCUS AUREUS >=100,000 COLONIES/mL GRANULICATELLA ADIACENS Standardized susceptibility testing for this organism is not available. Performed at [facility], [place] [address] [place], [place] [address]    Report Status 06/20/2018 FINAL  Final   Organism ID, Bacteria STAPHYLOCOCCUS AUREUS (A)  Final      Susceptibility   Staphylococcus aureus - MIC*    CIPROFLOXACIN <=0.5 SENSITIVE Sensitive     GENTAMICIN <=0.5 SENSITIVE Sensitive     NITROFURANTOIN <=16 SENSITIVE Sensitive     OXACILLIN <=0.25 SENSITIVE Sensitive     TETRACYCLINE <=1 SENSITIVE Sensitive     VANCOMYCIN <=0.5 SENSITIVE Sensitive     TRIMETH/SULFA <=10 SENSITIVE Sensitive     CLINDAMYCIN <=0.25 SENSITIVE Sensitive     RIFAMPIN <=0.5 SENSITIVE Sensitive     Inducible Clindamycin NEGATIVE Sensitive     * >=100,000 COLONIES/mL STAPHYLOCOCCUS AUREUS  Culture, blood (routine x 2)     Status: Abnormal   Collection Time: 06/16/18  1:53 PM  Result Value Ref Range Status   Specimen Description   Final    LEFT ANTECUBITAL Performed at [facility], [address], [place], [place] [address]    Special Requests   Final    BOTTLES DRAWN AEROBIC ONLY Blood Culture results may not be optimal due to an inadequate volume of blood received in culture bottles Performed at [facility], [address] [place], [place] [address]    Culture  Setup Time   Final    AEROBIC BOTTLE ONLY GRAM POSITIVE COCCI Gram Stain Report Called to,Read Back By and Verified With: J HEARN,RN @0338  06/17/18 [facility] Performed at [facility], [address] [place], [place] [address]    Culture (A)  Final    STAPHYLOCOCCUS AUREUS SUSCEPTIBILITIES PERFORMED ON PREVIOUS CULTURE WITHIN THE LAST 5 DAYS. Performed at [facility], [place] [address], [place], [place]  [address]    Report Status 06/19/2018 FINAL  Final  Culture, blood (routine x 2)     Status: Abnormal   Collection Time: 06/16/18  2:51 PM  Result Value  Ref Range Status   Specimen Description   Final    BLOOD BLOOD RIGHT WRIST Performed at [facility], [address] [place], [place] [address]    Special Requests   Final    BOTTLES DRAWN AEROBIC AND ANAEROBIC Blood Culture results may not be optimal due to an inadequate volume of blood received in culture bottles Performed at [facility], [address] [place], [place] [address]    Culture  Setup  Time   Final    IN BOTH AEROBIC AND ANAEROBIC BOTTLES GRAM POSITIVE COCCI Gram Stain Report Called to,Read Back By and Verified With: J HEARN,RN @0338  06/17/18 [facility] CRITICAL RESULT CALLED TO, READ BACK BY AND VERIFIED WITH: PHARMD L POOLE [id number] [phone] MLM Performed at [facility], [place] [address], [place], [place] [address]    Culture STAPHYLOCOCCUS AUREUS (A)  Final   Report Status 06/19/2018 FINAL  Final   Organism ID, Bacteria STAPHYLOCOCCUS AUREUS  Final      Susceptibility   Staphylococcus aureus - MIC*    CIPROFLOXACIN <=0.5 SENSITIVE Sensitive     ERYTHROMYCIN >=8 RESISTANT Resistant     GENTAMICIN <=0.5 SENSITIVE Sensitive     OXACILLIN <=0.25 SENSITIVE Sensitive     TETRACYCLINE <=1 SENSITIVE Sensitive     VANCOMYCIN <=0.5 SENSITIVE Sensitive     TRIMETH/SULFA <=10 SENSITIVE Sensitive     CLINDAMYCIN <=0.25 SENSITIVE Sensitive     RIFAMPIN <=0.5 SENSITIVE Sensitive     Inducible Clindamycin NEGATIVE Sensitive     * STAPHYLOCOCCUS AUREUS  Blood Culture ID Panel (Reflexed)     Status: Abnormal   Collection Time: 06/16/18  2:51 PM  Result Value Ref Range Status   Enterococcus species NOT DETECTED NOT DETECTED Final   Listeria monocytogenes NOT DETECTED NOT DETECTED Final   Staphylococcus species DETECTED (A) NOT DETECTED Final    Comment: CRITICAL RESULT CALLED TO, READ BACK BY AND VERIFIED WITH: PHARMD L POOLE [phone] MLM    Staphylococcus aureus (BCID) DETECTED (A) NOT DETECTED Final    Comment: Methicillin (oxacillin) susceptible Staphylococcus aureus (MSSA).  Preferred therapy is anti staphylococcal beta lactam antibiotic (Cefazolin or Nafcillin), unless clinically contraindicated. CRITICAL RESULT CALLED TO, READ BACK BY AND VERIFIED WITH: PHARMD L POOLE [phone] MLM    Methicillin resistance NOT DETECTED NOT DETECTED Final   Streptococcus species NOT DETECTED NOT DETECTED Final   Streptococcus agalactiae NOT DETECTED NOT DETECTED Final   Streptococcus pneumoniae NOT DETECTED NOT DETECTED Final   Streptococcus pyogenes NOT DETECTED NOT DETECTED Final   Acinetobacter baumannii NOT DETECTED NOT DETECTED Final   Enterobacteriaceae species NOT DETECTED NOT DETECTED Final   Enterobacter cloacae complex NOT DETECTED NOT DETECTED Final   Escherichia coli NOT DETECTED NOT DETECTED Final   Klebsiella oxytoca NOT DETECTED NOT DETECTED Final   Klebsiella pneumoniae NOT DETECTED NOT DETECTED Final   Proteus species NOT DETECTED NOT DETECTED Final   Serratia marcescens NOT DETECTED NOT DETECTED Final   Haemophilus influenzae NOT DETECTED NOT DETECTED Final   Neisseria meningitidis NOT DETECTED NOT DETECTED Final   Pseudomonas aeruginosa NOT DETECTED NOT DETECTED Final   Candida albicans NOT DETECTED NOT DETECTED Final   Candida glabrata NOT DETECTED NOT DETECTED Final   Candida krusei NOT DETECTED NOT DETECTED Final   Candida parapsilosis NOT DETECTED NOT DETECTED Final   Candida tropicalis NOT DETECTED NOT DETECTED Final    Comment: Performed at [facility], [place]. [address] [place], [place] [address]  MRSA PCR Screening     Status: None   Collection Time: 06/17/18 12:13 PM  Result Value Ref Range Status   MRSA by PCR NEGATIVE NEGATIVE Final    Comment:        The GeneXpert MRSA Assay (FDA approved for NASAL specimens only), is one component of a comprehensive MRSA colonization surveillance program. It is not intended to diagnose MRSA infection nor to guide or monitor treatment for MRSA infections. Performed at [facility], [place] [address], [place], [place] [address]   Aerobic/Anaerobic Culture (surgical/deep wound)     Status: None   Collection Time: 06/18/18 12:02 PM  Result Value Ref Range Status   Specimen Description VERTEBRA DISC  Final   Special Requests NONE  Final   Gram Stain   Final    ABUNDANT WBC PRESENT, PREDOMINANTLY PMN ABUNDANT GRAM POSITIVE COCCI IN PAIRS IN CLUSTERS    Culture   Final    MODERATE STAPHYLOCOCCUS AUREUS NO ANAEROBES ISOLATED Performed at [facility], [address], [place], [place] [address]    Report Status 06/23/2018 FINAL  Final   Organism ID, Bacteria STAPHYLOCOCCUS AUREUS  Final      Susceptibility   Staphylococcus aureus - MIC*    CIPROFLOXACIN <=0.5 SENSITIVE Sensitive     ERYTHROMYCIN >=8 RESISTANT Resistant     GENTAMICIN <=0.5 SENSITIVE Sensitive     OXACILLIN <=0.25 SENSITIVE Sensitive     TETRACYCLINE <=1 SENSITIVE Sensitive     VANCOMYCIN <=0.5 SENSITIVE Sensitive     TRIMETH/SULFA <=10 SENSITIVE Sensitive     CLINDAMYCIN <=0.25 SENSITIVE Sensitive     RIFAMPIN <=0.5 SENSITIVE Sensitive     Inducible Clindamycin NEGATIVE Sensitive     * MODERATE STAPHYLOCOCCUS AUREUS  Culture, blood (routine x 2)     Status: None   Collection Time: 06/18/18  4:15 PM  Result Value Ref Range Status   Specimen Description BLOOD LEFT ARM  Final   Special Requests   Final    BOTTLES DRAWN AEROBIC ONLY Blood Culture adequate volume   Culture   Final    NO GROWTH 5 DAYS Performed at [facility], [address] [place], [place] [address]    Report Status 06/23/2018 FINAL  Final  Culture, blood (routine x 2)     Status: None   Collection Time: 06/18/18  4:49 PM  Result Value Ref Range Status   Specimen Description BLOOD LEFT ANTECUBITAL  Final   Special Requests   Final    BOTTLES DRAWN AEROBIC AND ANAEROBIC Blood Culture adequate volume   Culture   Final    NO GROWTH 5 DAYS Performed at [facility], [place] [address] [place], [place] [address]     Report Status 06/23/2018 FINAL  Final  Aerobic Culture (superficial specimen)     Status: None   Collection Time: 06/19/18  1:04 PM  Result Value Ref Range Status   Specimen Description WOUND  Final   Special Requests PERINEAL  Final   Gram Stain   Final    NO WBC SEEN FEW YEAST Performed at [facility], [place] [address], [place], [place] [address]    Culture MODERATE CANDIDA ALBICANS  Final   Report Status 06/23/2018 FINAL  Final         Radiology Studies: No results found.      Scheduled Meds: . aspirin  81 mg Oral Daily  . feeding supplement (GLUCERNA SHAKE)  237 mL Oral TID BM  . feeding supplement (PRO-STAT SUGAR FREE 64)  30 mL Oral BID  . furosemide  40 mg Oral Daily  . heparin injection (subcutaneous)  5,000 Units Subcutaneous Q8H  . insulin aspart  0-15 Units Subcutaneous TID WC  . insulin aspart  0-5 Units Subcutaneous QHS  . insulin glargine  25 Units Subcutaneous Daily  . lisinopril  10 mg Oral Daily  . metoprolol tartrate  25 mg Oral BID  . multivitamin with  minerals  1 tablet Oral Daily  . pantoprazole  40 mg Oral Daily  . vitamin B-12  1,000 mcg Oral Daily   Continuous Infusions: . sodium chloride 250 mL (06/20/18 0610)  .  ceFAZolin (ANCEF) IV 2 g (06/25/18 0549)     LOS: 9 days    Time spent: 35 mins.More than 50% of that time was spent in counseling and/or coordination of care.      Shelly Coss, MD [facility] Pager [phone]  If 7PM-7AM, please contact night-coverage [email or web address] Password [facility] 06/25/2018, 10:44 AM

## 2018-06-25 NOTE — Progress Notes (Signed)
Nutrition Follow-up  DOCUMENTATION CODES:   Obesity unspecified  INTERVENTION:   -Continue 30 ml Prostat BID, each supplement provides 100 kcals and 15 grams protein -Continue Glucerna Shake po TID, each supplement provides 220 kcal and 10 grams of protein -Continue MVI with minerals daily  NUTRITION DIAGNOSIS:   Increased nutrient needs related to wound healing as evidenced by estimated needs.  Ongoing  GOAL:   Patient will meet greater than or equal to 90% of their needs  Progressing  MONITOR:   PO intake, Labs, I & O's, Supplement acceptance, Diet advancement  REASON FOR ASSESSMENT:   Consult Wound healing  ASSESSMENT:   68 y/o female w/ PMHx of HTN/HLD, DM2. Presented with weakness and back pain, latter of which she has had x4 weeks. Had just been admitted 1/18-1/20 d/t cough and was dx w/ adenovirus. Now represents with continued cough, back pain and new urinary incontinence. Work up revealed large abscess in R paraspinous tissues. Also noted to have a R diabetic foot ulcer and be in DKA. RD consulted for wound healing.   1/29- x-fer to [facility]; MRI of lumbar spine showed L1-2 discitis/osteomyelitis with extensive phlegmon and abscess in the R-sided paravertebral soft tissues; S/p L1-2 disc aspiration  2/1- PICC placed  Reviewed I/O's: -595 ml x 24 hours and +5.5 L since admission  Pt working with therapy at time of visit.   Pt's appetite still fair to poor (meal completion 25-50%). Pt is also taking Glucerna and Prostat supplements as ordered. Plan to d/c to CIR once bed is available per MD notes.   Labs reviewed: CBGS: 130, CBGS: 169-265 (inpatient orders for glycemic control are 0-15 unit sinsulin aspart TID with meals, 0-5 units insulin aspart q HS, and 25 units insulin glargine daily).   Diet Order:   Diet Order            Diet Carb Modified Fluid consistency: Thin; Room service appropriate? Yes  Diet effective now              EDUCATION NEEDS:   No  education needs have been identified at this time  Skin:  Skin Assessment: Skin Integrity Issues: Skin Integrity Issues:: Diabetic Ulcer Diabetic Ulcer: R- foot  Last BM:  06/22/18  Height:   Ht Readings from Last 1 Encounters:  06/18/18 4\' 10"  (1.473 m)    Weight:   Wt Readings from Last 1 Encounters:  06/25/18 74.9 kg    Ideal Body Weight:  44.7 kg  BMI:  Body mass index is 34.51 kg/m.  Estimated Nutritional Needs:   Kcal:  1450-1600 (20-22 kcals/kg bw)  Protein:  65-80g Pro (1.5-1.8 g/kg bw)  Fluid:  1.4-1.6 L fluid (1 ml/kcal)    Kaitlin Vargas, RD, LDN, CDE Pager: [phone] After hours Pager: [phone]

## 2018-06-26 ENCOUNTER — Other Ambulatory Visit: Payer: Self-pay

## 2018-06-26 ENCOUNTER — Inpatient Hospital Stay (HOSPITAL_COMMUNITY)
Admission: RE | Admit: 2018-06-26 | Discharge: 2018-07-10 | DRG: 551 | Disposition: A | Discharge status: Other Acute Inpatient Hospital | Payer: Medicare HMO | Source: Intra-hospital | Attending: Physical Medicine & Rehabilitation | Admitting: Physical Medicine & Rehabilitation

## 2018-06-26 ENCOUNTER — Encounter (HOSPITAL_COMMUNITY): Payer: Self-pay

## 2018-06-26 DIAGNOSIS — R5381 Other malaise: Secondary | ICD-10-CM

## 2018-06-26 DIAGNOSIS — I10 Essential (primary) hypertension: Secondary | ICD-10-CM | POA: Diagnosis present

## 2018-06-26 DIAGNOSIS — M7989 Other specified soft tissue disorders: Secondary | ICD-10-CM | POA: Diagnosis not present

## 2018-06-26 DIAGNOSIS — L97509 Non-pressure chronic ulcer of other part of unspecified foot with unspecified severity: Secondary | ICD-10-CM

## 2018-06-26 DIAGNOSIS — R7881 Bacteremia: Secondary | ICD-10-CM

## 2018-06-26 DIAGNOSIS — R7309 Other abnormal glucose: Secondary | ICD-10-CM | POA: Diagnosis not present

## 2018-06-26 DIAGNOSIS — G061 Intraspinal abscess and granuloma: Secondary | ICD-10-CM | POA: Diagnosis not present

## 2018-06-26 DIAGNOSIS — M48061 Spinal stenosis, lumbar region without neurogenic claudication: Secondary | ICD-10-CM | POA: Diagnosis present

## 2018-06-26 DIAGNOSIS — E785 Hyperlipidemia, unspecified: Secondary | ICD-10-CM | POA: Diagnosis present

## 2018-06-26 DIAGNOSIS — E11649 Type 2 diabetes mellitus with hypoglycemia without coma: Secondary | ICD-10-CM | POA: Diagnosis not present

## 2018-06-26 DIAGNOSIS — M4626 Osteomyelitis of vertebra, lumbar region: Secondary | ICD-10-CM

## 2018-06-26 DIAGNOSIS — Z7982 Long term (current) use of aspirin: Secondary | ICD-10-CM

## 2018-06-26 DIAGNOSIS — R339 Retention of urine, unspecified: Secondary | ICD-10-CM | POA: Diagnosis not present

## 2018-06-26 DIAGNOSIS — Z8674 Personal history of sudden cardiac arrest: Secondary | ICD-10-CM | POA: Diagnosis not present

## 2018-06-26 DIAGNOSIS — D649 Anemia, unspecified: Secondary | ICD-10-CM | POA: Diagnosis not present

## 2018-06-26 DIAGNOSIS — L97519 Non-pressure chronic ulcer of other part of right foot with unspecified severity: Secondary | ICD-10-CM | POA: Diagnosis present

## 2018-06-26 DIAGNOSIS — B9561 Methicillin susceptible Staphylococcus aureus infection as the cause of diseases classified elsewhere: Secondary | ICD-10-CM | POA: Diagnosis present

## 2018-06-26 DIAGNOSIS — R32 Unspecified urinary incontinence: Secondary | ICD-10-CM | POA: Diagnosis not present

## 2018-06-26 DIAGNOSIS — Z90710 Acquired absence of both cervix and uterus: Secondary | ICD-10-CM | POA: Diagnosis not present

## 2018-06-26 DIAGNOSIS — E08621 Diabetes mellitus due to underlying condition with foot ulcer: Secondary | ICD-10-CM | POA: Diagnosis not present

## 2018-06-26 DIAGNOSIS — M4646 Discitis, unspecified, lumbar region: Secondary | ICD-10-CM | POA: Diagnosis present

## 2018-06-26 DIAGNOSIS — Z882 Allergy status to sulfonamides status: Secondary | ICD-10-CM | POA: Diagnosis not present

## 2018-06-26 DIAGNOSIS — E538 Deficiency of other specified B group vitamins: Secondary | ICD-10-CM | POA: Diagnosis present

## 2018-06-26 DIAGNOSIS — R000 Tachycardia, unspecified: Secondary | ICD-10-CM

## 2018-06-26 DIAGNOSIS — E1169 Type 2 diabetes mellitus with other specified complication: Secondary | ICD-10-CM | POA: Diagnosis present

## 2018-06-26 DIAGNOSIS — Z7984 Long term (current) use of oral hypoglycemic drugs: Secondary | ICD-10-CM | POA: Diagnosis not present

## 2018-06-26 DIAGNOSIS — D638 Anemia in other chronic diseases classified elsewhere: Secondary | ICD-10-CM | POA: Diagnosis present

## 2018-06-26 DIAGNOSIS — E1151 Type 2 diabetes mellitus with diabetic peripheral angiopathy without gangrene: Secondary | ICD-10-CM

## 2018-06-26 DIAGNOSIS — E11621 Type 2 diabetes mellitus with foot ulcer: Secondary | ICD-10-CM | POA: Diagnosis present

## 2018-06-26 DIAGNOSIS — E871 Hypo-osmolality and hyponatremia: Secondary | ICD-10-CM | POA: Diagnosis present

## 2018-06-26 DIAGNOSIS — E1165 Type 2 diabetes mellitus with hyperglycemia: Secondary | ICD-10-CM | POA: Diagnosis present

## 2018-06-26 DIAGNOSIS — Z79899 Other long term (current) drug therapy: Secondary | ICD-10-CM

## 2018-06-26 DIAGNOSIS — K5900 Constipation, unspecified: Secondary | ICD-10-CM | POA: Diagnosis present

## 2018-06-26 DIAGNOSIS — G062 Extradural and subdural abscess, unspecified: Secondary | ICD-10-CM

## 2018-06-26 DIAGNOSIS — E1142 Type 2 diabetes mellitus with diabetic polyneuropathy: Secondary | ICD-10-CM | POA: Diagnosis present

## 2018-06-26 LAB — CBC
HCT: 24.8 % — ABNORMAL LOW (ref 36.0–46.0)
Hemoglobin: 7.9 g/dL — ABNORMAL LOW (ref 12.0–15.0)
MCH: 32.6 pg (ref 26.0–34.0)
MCHC: 31.9 g/dL (ref 30.0–36.0)
MCV: 102.5 fL — ABNORMAL HIGH (ref 80.0–100.0)
Platelets: 313 10*3/uL (ref 150–400)
RBC: 2.42 MIL/uL — ABNORMAL LOW (ref 3.87–5.11)
RDW: 19.3 % — ABNORMAL HIGH (ref 11.5–15.5)
WBC: 8.2 10*3/uL (ref 4.0–10.5)
nRBC: 0 % (ref 0.0–0.2)

## 2018-06-26 LAB — BASIC METABOLIC PANEL
Anion gap: 12 (ref 5–15)
BUN: 15 mg/dL (ref 8–23)
CO2: 30 mmol/L (ref 22–32)
Calcium: 8.2 mg/dL — ABNORMAL LOW (ref 8.9–10.3)
Chloride: 89 mmol/L — ABNORMAL LOW (ref 98–111)
Creatinine, Ser: 0.6 mg/dL (ref 0.44–1.00)
GFR calc Af Amer: >60 mL/min (ref >60)
GFR calc non Af Amer: >60 mL/min (ref >60)
GLUCOSE: 143 mg/dL — AB (ref 70–99)
Potassium: 3.8 mmol/L (ref 3.5–5.1)
Sodium: 131 mmol/L — ABNORMAL LOW (ref 135–145)

## 2018-06-26 LAB — CREATININE, SERUM
Creatinine, Ser: 0.58 mg/dL (ref 0.44–1.00)
GFR calc Af Amer: >60 mL/min (ref >60)
GFR calc non Af Amer: >60 mL/min (ref >60)

## 2018-06-26 LAB — GLUCOSE, CAPILLARY
GLUCOSE-CAPILLARY: 161 mg/dL — AB (ref 70–99)
Glucose-Capillary: 123 mg/dL — ABNORMAL HIGH (ref 70–99)
Glucose-Capillary: 152 mg/dL — ABNORMAL HIGH (ref 70–99)
Glucose-Capillary: 139 mg/dL — ABNORMAL HIGH (ref 70–99)

## 2018-06-26 MED ORDER — INSULIN GLARGINE 100 UNIT/ML ~~LOC~~ SOLN
25.0000 [IU] | Freq: Every day | SUBCUTANEOUS | Status: DC
  Administered 2018-06-27 – 2018-07-09 (×13): 25 [IU] via SUBCUTANEOUS
  Administered 2018-07-10: 20 [IU] via SUBCUTANEOUS
  Filled 2018-06-26 (×14): qty 0.25

## 2018-06-26 MED ORDER — PRO-STAT SUGAR FREE PO LIQD
30.0000 mL | Freq: Two times a day (BID) | ORAL | Status: DC
  Administered 2018-06-26 – 2018-07-10 (×28): 30 mL via ORAL
  Filled 2018-06-26 (×29): qty 30

## 2018-06-26 MED ORDER — ASPIRIN 81 MG PO CHEW
81.0000 mg | CHEWABLE_TABLET | Freq: Every day | ORAL | Status: DC
  Administered 2018-06-27 – 2018-07-10 (×14): 81 mg via ORAL
  Filled 2018-06-26 (×14): qty 1

## 2018-06-26 MED ORDER — PANTOPRAZOLE SODIUM 40 MG PO TBEC
40.0000 mg | DELAYED_RELEASE_TABLET | Freq: Every day | ORAL | Status: DC
  Administered 2018-06-27 – 2018-07-10 (×14): 40 mg via ORAL
  Filled 2018-06-26 (×14): qty 1

## 2018-06-26 MED ORDER — GLUCERNA SHAKE PO LIQD
237.0000 mL | Freq: Three times a day (TID) | ORAL | Status: DC
  Administered 2018-06-26 – 2018-07-08 (×35): 237 mL via ORAL
  Filled 2018-06-26 (×3): qty 237

## 2018-06-26 MED ORDER — DM-GUAIFENESIN ER 30-600 MG PO TB12: 1.0000 | ORAL_TABLET | Freq: Two times a day (BID) | ORAL | 0 refills | Status: DC

## 2018-06-26 MED ORDER — HEPARIN SODIUM (PORCINE) 5000 UNIT/ML IJ SOLN
5000.0000 [IU] | Freq: Three times a day (TID) | INTRAMUSCULAR | Status: DC
  Administered 2018-06-26 – 2018-07-10 (×41): 5000 [IU] via SUBCUTANEOUS
  Filled 2018-06-26 (×41): qty 1

## 2018-06-26 MED ORDER — ALBUTEROL SULFATE (2.5 MG/3ML) 0.083% IN NEBU: 2.5000 mg | INHALATION_SOLUTION | RESPIRATORY_TRACT | Status: DC | PRN

## 2018-06-26 MED ORDER — HYDROCODONE-ACETAMINOPHEN 10-325 MG PO TABS: 1.0000 | ORAL_TABLET | ORAL | 0 refills | Status: DC | PRN

## 2018-06-26 MED ORDER — HEPARIN SODIUM (PORCINE) 5000 UNIT/ML IJ SOLN: 5000.0000 [IU] | Freq: Three times a day (TID) | INTRAMUSCULAR | Status: DC

## 2018-06-26 MED ORDER — LISINOPRIL 10 MG PO TABS
10.0000 mg | ORAL_TABLET | Freq: Every day | ORAL | Status: DC
  Administered 2018-06-27 – 2018-07-10 (×14): 10 mg via ORAL
  Filled 2018-06-26 (×14): qty 1

## 2018-06-26 MED ORDER — ADULT MULTIVITAMIN W/MINERALS CH
1.0000 | ORAL_TABLET | Freq: Every day | ORAL | Status: DC
  Administered 2018-06-27 – 2018-07-10 (×14): 1 via ORAL
  Filled 2018-06-26 (×14): qty 1

## 2018-06-26 MED ORDER — FUROSEMIDE 40 MG PO TABS
40.0000 mg | ORAL_TABLET | Freq: Every day | ORAL | Status: DC
  Administered 2018-06-27 – 2018-07-10 (×14): 40 mg via ORAL
  Filled 2018-06-26 (×14): qty 1

## 2018-06-26 MED ORDER — METOPROLOL TARTRATE 25 MG PO TABS
25.0000 mg | ORAL_TABLET | Freq: Two times a day (BID) | ORAL | Status: DC
  Administered 2018-06-26 – 2018-07-10 (×28): 25 mg via ORAL
  Filled 2018-06-26 (×29): qty 1

## 2018-06-26 MED ORDER — SODIUM CHLORIDE 0.9% FLUSH
10.0000 mL | INTRAVENOUS | Status: DC | PRN
  Administered 2018-06-26 – 2018-07-01 (×5): 10 mL
  Administered 2018-07-02: 20 mL
  Administered 2018-07-04 – 2018-07-08 (×4): 10 mL
  Filled 2018-06-26 (×10): qty 40

## 2018-06-26 MED ORDER — KETOCONAZOLE 2 % EX CREA
TOPICAL_CREAM | Freq: Two times a day (BID) | CUTANEOUS | Status: DC
  Administered 2018-06-26 – 2018-06-28 (×5): via TOPICAL
  Administered 2018-06-29: 1 via TOPICAL
  Administered 2018-06-29 – 2018-06-30 (×2): via TOPICAL
  Administered 2018-06-30: 1 via TOPICAL
  Administered 2018-07-01 – 2018-07-10 (×19): via TOPICAL
  Filled 2018-06-26 (×3): qty 15

## 2018-06-26 MED ORDER — CEFAZOLIN SODIUM-DEXTROSE 2-4 GM/100ML-% IV SOLN
2.0000 g | Freq: Three times a day (TID) | INTRAVENOUS | Status: DC
  Administered 2018-06-26 – 2018-07-09 (×39): 2 g via INTRAVENOUS
  Filled 2018-06-26 (×41): qty 100

## 2018-06-26 MED ORDER — HYDROCODONE-ACETAMINOPHEN 10-325 MG PO TABS
1.0000 | ORAL_TABLET | ORAL | Status: DC | PRN
  Administered 2018-06-26 – 2018-07-10 (×53): 1 via ORAL
  Filled 2018-06-26 (×55): qty 1

## 2018-06-26 MED ORDER — VITAMIN B-12 1000 MCG PO TABS
1000.0000 ug | ORAL_TABLET | Freq: Every day | ORAL | Status: DC
  Administered 2018-06-27 – 2018-07-10 (×14): 1000 ug via ORAL
  Filled 2018-06-26 (×14): qty 1

## 2018-06-26 MED ORDER — SORBITOL 70 % SOLN
30.0000 mL | Freq: Every day | Status: DC | PRN
  Administered 2018-06-26 – 2018-07-03 (×4): 30 mL via ORAL
  Filled 2018-06-26 (×4): qty 30

## 2018-06-26 MED ORDER — INSULIN ASPART 100 UNIT/ML ~~LOC~~ SOLN
0.0000 [IU] | Freq: Three times a day (TID) | SUBCUTANEOUS | Status: DC
  Administered 2018-06-26: 3 [IU] via SUBCUTANEOUS
  Administered 2018-06-27: 2 [IU] via SUBCUTANEOUS
  Administered 2018-06-27: 3 [IU] via SUBCUTANEOUS
  Administered 2018-06-28: 2 [IU] via SUBCUTANEOUS
  Administered 2018-06-28: 3 [IU] via SUBCUTANEOUS
  Administered 2018-06-28: 2 [IU] via SUBCUTANEOUS
  Administered 2018-06-29 (×3): 3 [IU] via SUBCUTANEOUS
  Administered 2018-06-30: 2 [IU] via SUBCUTANEOUS
  Administered 2018-07-01: 3 [IU] via SUBCUTANEOUS
  Administered 2018-07-01 – 2018-07-02 (×2): 2 [IU] via SUBCUTANEOUS
  Administered 2018-07-02: 8 [IU] via SUBCUTANEOUS
  Administered 2018-07-03 (×2): 3 [IU] via SUBCUTANEOUS
  Administered 2018-07-04: 5 [IU] via SUBCUTANEOUS
  Administered 2018-07-05: 3 [IU] via SUBCUTANEOUS
  Administered 2018-07-06: 5 [IU] via SUBCUTANEOUS
  Administered 2018-07-06: 3 [IU] via SUBCUTANEOUS
  Administered 2018-07-07 (×3): 2 [IU] via SUBCUTANEOUS
  Administered 2018-07-08: 5 [IU] via SUBCUTANEOUS

## 2018-06-26 MED ORDER — METOPROLOL TARTRATE 25 MG PO TABS: 25.0000 mg | ORAL_TABLET | Freq: Two times a day (BID) | ORAL | Status: DC

## 2018-06-26 MED ORDER — FUROSEMIDE 20 MG PO TABS: 40.0000 mg | ORAL_TABLET | Freq: Every day | ORAL | Status: DC

## 2018-06-26 MED ORDER — CEFAZOLIN IV (FOR PTA / DISCHARGE USE ONLY): 2.0000 g | Freq: Three times a day (TID) | INTRAVENOUS | 0 refills | Status: DC

## 2018-06-26 MED ORDER — SODIUM CHLORIDE 0.9% FLUSH
10.0000 mL | Freq: Two times a day (BID) | INTRAVENOUS | Status: DC
  Administered 2018-06-26 – 2018-07-08 (×14): 10 mL

## 2018-06-26 MED ORDER — METHOCARBAMOL 500 MG PO TABS
500.0000 mg | ORAL_TABLET | Freq: Four times a day (QID) | ORAL | Status: DC | PRN
  Administered 2018-06-26 – 2018-07-04 (×14): 500 mg via ORAL
  Filled 2018-06-26 (×14): qty 1

## 2018-06-26 MED ORDER — METHOCARBAMOL 500 MG PO TABS: 500.0000 mg | ORAL_TABLET | Freq: Four times a day (QID) | ORAL | Status: DC | PRN

## 2018-06-26 NOTE — Progress Notes (Signed)
Occupational Therapy Treatment Patient Details Name: Kaitlin Vargas MRN: [id number] DOB: 01-22-1951 Today's Date: 06/26/2018    History of present illness Pt is a 68 y.o. female admitted 06/16/18 with back pain. 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 Transferred to [facility] for lumbar puncture on 1/29; during procedure, pt with active bleeding and cardiac arrest requiring CPR. PMH includes peripheral neuropathy, HTN, DM.   OT comments  Pt making progress with functional goals. Pt continues to be appropriate for d/c to CIR when medically ready. OT will continue to follow acutely  Follow Up Recommendations  CIR    Equipment Recommendations   TBD   Recommendations for Other Services      Precautions / Restrictions Precautions Precautions: Fall;Back Precaution Comments: back precautions for comfort Restrictions Weight Bearing Restrictions: No       Mobility Bed Mobility               General bed mobility comments: pt in recliner upon arrival  Transfers Overall transfer level: Needs assistance Equipment used: Rolling walker (2 wheeled) Transfers: Sit to/from [employer] Transfers Sit to Stand: Max assist Stand pivot transfers: Mod assist            Balance Overall balance assessment: Needs assistance Sitting-balance support: Bilateral upper extremity supported;Feet supported Sitting balance-Leahy Scale: Fair     Standing balance support: Bilateral upper extremity supported;During functional activity Standing balance-Leahy Scale: Poor                             ADL either performed or assessed with clinical judgement   ADL Overall ADL's : Needs assistance/impaired     Grooming: Wash/dry hands;Standing;Moderate assistance;Wash/dry face           Upper Body Dressing : Min guard;Sitting   Lower Body Dressing: Total assistance   Toilet Transfer: Maximal assistance;Moderate assistance;Stand-pivot;BSC;RW;Cueing for safety;Cueing for sequencing    Toileting- Clothing Manipulation and Hygiene: Total assistance;Sit to/from stand       Functional mobility during ADLs: Maximal assistance;Moderate assistance;Cueing for safety;Rolling walker       Vision Baseline Vision/History: Wears glasses Patient Visual Report: No change from baseline     Perception     Praxis      Cognition Arousal/Alertness: Awake/alert Behavior During Therapy: WFL for tasks assessed/performed Overall Cognitive Status: Impaired/Different from baseline Area of Impairment: Following commands;Problem solving                       Following Commands: Follows one step commands with increased time     Problem Solving: Slow processing;Decreased initiation;Difficulty sequencing;Requires verbal cues;Requires tactile cues          Exercises     Shoulder Instructions       General Comments      Pertinent Vitals/ Pain       Pain Assessment: 0-10 Pain Score: 5  Pain Location: back and legs  Pain Descriptors / Indicators: Aching;Sore Pain Intervention(s): Limited activity within patient's tolerance;Monitored during session;Repositioned  Home Living   Living Arrangements: Spouse/significant other(and daughter, [name])                                  Lives With: Spouse;Daughter    Prior Functioning/Environment              Frequency  Min 2X/week        Progress Toward Goals  OT  Goals(current goals can now be found in the care plan section)  Progress towards OT goals: Progressing toward goals     Plan Discharge plan remains appropriate    Co-evaluation                 AM-PAC OT "6 Clicks" Daily Activity     Outcome Measure   Help from another person eating meals?: None Help from another person taking care of personal grooming?: A Little Help from another person toileting, which includes using toliet, bedpan, or urinal?: Total Help from another person bathing (including washing, rinsing,  drying)?: A Lot Help from another person to put on and taking off regular upper body clothing?: A Lot Help from another person to put on and taking off regular lower body clothing?: Total 6 Click Score: 13    End of Session Equipment Utilized During Treatment: Gait belt;Rolling walker;Other (comment)(BSC)  OT Visit Diagnosis: Unsteadiness on feet (R26.81);Pain;Other symptoms and signs involving cognitive function;Muscle weakness (generalized) (M62.81) Pain - part of body: (back)   Activity Tolerance Patient tolerated treatment well   Patient Left in chair;with call bell/phone within reach;with chair alarm set   Nurse Communication          Time: 0034-9179 OT Time Calculation (min): 24 min  Charges: OT General Charges $OT Visit: 1 Visit OT Treatments $Self Care/Home Management : 8-22 mins $Therapeutic Activity: 8-22 mins     Britt Bottom 06/26/2018, 1:04 PM

## 2018-06-26 NOTE — Clinical Social Work Note (Signed)
Patient will discharge to CIR today.  CSW signing off.  Allante Beane, CSW [phone]  

## 2018-06-26 NOTE — Progress Notes (Signed)
Cristina Gong, RN  Rehab Admission Coordinator  Physical Medicine and Rehabilitation  PMR Pre-admission  Signed  Date of Service:  06/25/2018 4:03 PM       Related encounter: ED to Hosp-Admission (Current) from 06/16/2018 in [facility]      Signed         Show:Clear all [x] Manual[x] Template[x] Copied  Added by: [x] Cristina Gong, RN  [] Hover for details PMR Admission Coordinator Pre-Admission Assessment  Patient: Kaitlin Vargas is an 68 y.o., female MRN: [id number] DOB: [birth year] Height: 4\' 10"  (147.3 cm) Weight: 74.4 kg                                                                                                                                                  Insurance Information HMO: yes    PPO:      PCP:      IPA:      80/20:      OTHER: Medicare advantage plan PRIMARY: [employer]      Policy#: [id number]      Subscriber: pt CM Name: Shirlean Mylar      Phone#: [phone] ext [phone]     Fax#: [phone] Pre-Cert#: [id number] approved for 7 days with f/u Joslyn Devon ext [phone] fax; Same      Employer: disabled Benefits:  Phone #: [phone]     Name:  Eff. Date: 05/21/2018     Deduct: none      Out of Pocket Max: $3400      Life Max: none CIR: $295 co pay per day days 1 until 6      SNF: no co pay per day days 1 until 20; $178 co pay per day days 21 until 100 Outpatient: $40 co pay per visit     Co-Pay: visits per medical neccesity Home Health: 100%      Co-Pay: visits per medical neccesity DME: 80%     Co-Pay: 20% Providers: in network  SECONDARY: none     Medicaid Application Date:       Case Manager:  Disability Application Date:       Case Worker:   [employer] copy Information    Name Relation Home Work [place], [place] Spouse   [phone]     Current Medical History  Patient Admitting Diagnosis: L 1 -2 osteomyelitis, right foot ulceration  History of Present Illness:  Kaitlin Vargas is a 68 year old right handed female history of hypertension, hyperlipidemia, B12 deficiency and diabetes mellitus. Patient was reported back pain 4 weeks.  She recently came to the hospital with cough was diagnosed with adenovirus in mid January received antibiotic coverage recovering nicely and was discharged to home. She continued to have back pain radiating to the lower extremities as well as bouts of urinary  incontinence and stool incontinence. Present [facility] hospital 06/16/2018 with increasing back pain and a CT scan of the spine showed what appeared to be osteomyelitis. She was transferred to [facility] interventional radiology for lumbar puncture and during procedure noted increase active bleeding as well as cardiac arrest requiring CPR 90 seconds. She did not require intubation. He was felt cardiac arrest likely due to respiratory arrest following sedation. 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 Patient also with right foot ulceration. MRI of the foot negative for acute bony or joint abnormality. Small volume of fluid in the subcutaneous tissue deep to the first TMT joint. Infectious disease consulted patient with GPCs growing from lumbar aspirate. Placed on antibiotic therapy. TEE negative for vegetation. Current plans to continue cefazolin 6-8 weeks with in date of 08/13/2018. Subcutaneous heparin for DVT prophylaxis. Hospital course ongoing pain management. Acute on chronic anemia latest hemoglobin 9.9. Bouts of hyponatremia 130 and monitored.   Past Medical History      Past Medical History:  Diagnosis Date  . Anemia   . Diabetes mellitus without complication ([place])   . Hyperlipidemia   . Hypertension   . Peripheral neuropathy     Family History  family history includes Cancer in her father and mother; Leukemia in her father; Stroke in her mother.  Prior  Rehab/Hospitalizations:  Has the patient had major surgery during 100 days prior to admission? No  Current Medications   Current Facility-Administered Medications:  .  0.9 %  sodium chloride infusion, , Intravenous, PRN, Rush Farmer, MD, Last Rate: 10 mL/hr at 06/26/18 0900 .  albuterol (PROVENTIL) (2.5 MG/3ML) 0.083% nebulizer solution 2.5 mg, 2.5 mg, Nebulization, Q2H PRN, Karmen Bongo, MD .  aspirin chewable tablet 81 mg, 81 mg, Oral, Daily, Karmen Bongo, MD, 81 mg at 06/26/18 1020 .  ceFAZolin (ANCEF) IVPB 2g/100 mL premix, 2 g, Intravenous, Q8H, Susa Raring, RPH, Last Rate: 200 mL/hr at 06/26/18 0534, 2 g at 06/26/18 0534 .  feeding supplement (GLUCERNA SHAKE) (GLUCERNA SHAKE) liquid 237 mL, 237 mL, Oral, TID BM, Adhikari, Amrit, MD, 237 mL at 06/26/18 1021 .  feeding supplement (PRO-STAT SUGAR FREE 64) liquid 30 mL, 30 mL, Oral, BID, Memon, Jehanzeb, MD, 30 mL at 06/26/18 1022 .  fentaNYL (SUBLIMAZE) injection 25-50 mcg, 25-50 mcg, Intravenous, Q2H PRN, Desai, Rahul P, PA-C, 50 mcg at 06/25/18 0900 .  furosemide (LASIX) tablet 40 mg, 40 mg, Oral, Daily, Adhikari, Amrit, MD, 40 mg at 06/26/18 1021 .  heparin injection 5,000 Units, 5,000 Units, Subcutaneous, Q8H, Shelly Coss, MD, 5,000 Units at 06/26/18 0534 .  HYDROcodone-acetaminophen (NORCO) 10-325 MG per tablet 1 tablet, 1 tablet, Oral, Q4H PRN, Shelly Coss, MD, 1 tablet at 06/26/18 1017 .  insulin aspart (novoLOG) injection 0-15 Units, 0-15 Units, Subcutaneous, TID WC, Kathie Dike, MD, 5 Units at 06/25/18 1734 .  insulin aspart (novoLOG) injection 0-5 Units, 0-5 Units, Subcutaneous, QHS, Kathie Dike, MD, 3 Units at 06/24/18 2226 .  insulin glargine (LANTUS) injection 25 Units, 25 Units, Subcutaneous, Daily, Shelly Coss, MD, 25 Units at 06/26/18 1022 .  lisinopril (PRINIVIL,ZESTRIL) tablet 10 mg, 10 mg, Oral, Daily, Karmen Bongo, MD, 10 mg at 06/26/18 1019 .  methocarbamol (ROBAXIN) tablet 500 mg,  500 mg, Oral, Q6H PRN, Shelly Coss, MD, 500 mg at 06/26/18 0844 .  metoprolol tartrate (LOPRESSOR) tablet 25 mg, 25 mg, Oral, BID, Kathie Dike, MD, 25 mg at  06/26/18 1019 .  multivitamin with minerals tablet 1 tablet, 1 tablet, Oral, Daily, Adhikari, Amrit, MD, 1 tablet at 06/26/18 1020 .  ondansetron (ZOFRAN) injection 4 mg, 4 mg, Intravenous, Q6H PRN, Karmen Bongo, MD, 4 mg at 06/18/18 0947 .  pantoprazole (PROTONIX) EC tablet 40 mg, 40 mg, Oral, Daily, Karmen Bongo, MD, 40 mg at 06/26/18 1020 .  sodium chloride flush (NS) 0.9 % injection 10-40 mL, 10-40 mL, Intracatheter, PRN, Tawanna Solo, Amrit, MD, 20 mL at 06/26/18 0330 .  vitamin B-12 (CYANOCOBALAMIN) tablet 1,000 mcg, 1,000 mcg, Oral, Daily, Karmen Bongo, MD, 1,000 mcg at 06/26/18 1020  Patients Current Diet:     Diet Order                  Diet - low sodium heart healthy         Diet Carb Modified Fluid consistency: Thin; Room service appropriate? Yes  Diet effective now               Precautions / Restrictions Precautions Precautions: Fall, Back Precaution Comments: back precautions for comfort Restrictions Weight Bearing Restrictions: No   Has the patient had 2 or more falls or a fall with injury in the past year?No  Prior Activity Level Community (5-7x/wk): was not driving pta due to vision issues. Used RW and/or cane as needed due to peripheral neuropathy  Development worker, international aid / Equipment Home Assistive Devices/Equipment: Environmental consultant (specify type), CBG Meter, Cane (specify quad or straight), Eyeglasses, Dentures (specify type), Shower chair with back, Bedside commode/3-in-1 Home Equipment: Walker - 2 wheels, Cane - single point  Prior Device Use: Indicate devices/aids used by the patient prior to current illness, exacerbation or injury? Walker  Prior Functional Level Prior Function Level of Independence: Independent with assistive device(s)  Self Care: Did the patient need help  bathing, dressing, using the toilet or eating?  Independent  Indoor Mobility: Did the patient need assistance with walking from room to room (with or without device)? Independent  Stairs: Did the patient need assistance with internal or external stairs (with or without device)? Independent  Functional Cognition: Did the patient need help planning regular tasks such as shopping or remembering to take medications? Independent  Current Functional Level Cognition  Overall Cognitive Status: Impaired/Different from baseline Orientation Level: Oriented X4 Following Commands: Follows one step commands with increased time    Extremity Assessment (includes Sensation/Coordination)  Upper Extremity Assessment: Generalized weakness  Lower Extremity Assessment: Generalized weakness    ADLs  Overall ADL's : Needs assistance/impaired Eating/Feeding: Independent Grooming: Wash/dry hands, Oral care, Brushing hair, Standing, Set up, Maximal assistance(with stedy at sink) Grooming Details (indicate cue type and reason): assist needed for brushing hair Upper Body Bathing: Minimal assistance Lower Body Bathing: Total assistance, Bed level Upper Body Dressing : Minimal assistance, Sitting Upper Body Dressing Details (indicate cue type and reason): front opening gown Lower Body Dressing: Total assistance Lower Body Dressing Details (indicate cue type and reason): for mesh underwear with 3 pads Toileting- Clothing Manipulation and Hygiene: Total assistance, +2 for physical assistance, Sit to/from stand Functional mobility during ADLs: (per PT she is a 2 person transfer with steady to stand) General ADL Comments: pt with constant urinary incontinence    Mobility  Overal bed mobility: Needs Assistance Bed Mobility: Rolling, Sidelying to Sit Rolling: Min assist Sidelying to sit: Mod assist Sit to supine: Mod assist General bed mobility comments: cues for log roll technique, increased time,  assist to raise trunk    Transfers  Overall transfer level: Needs assistance Equipment used: Rolling walker (2 wheeled) Transfer via Lift Equipment: Stedy Transfers: Sit to/from Stand, Risk manager Sit to Stand: +2 physical assistance, Mod assist, From elevated surface Stand pivot transfers: Max assist, +2 physical assistance General transfer comment: Pt was able to stand to [place] with mod assist for support.  Pt needed to be cleaned and nurse present and cleaned pt.  Pt did not buckle today and was able to safely sit on STedy and be moved to chair. STood 2 more times for about a minute each time.       Ambulation / Gait / Stairs / Wheelchair Mobility  Ambulation/Gait General Gait Details: deferred, will require +2 for safety and incontinent with standing     Posture / Balance Balance Overall balance assessment: Needs assistance Sitting-balance support: Bilateral upper extremity supported, Feet supported Sitting balance-Leahy Scale: Fair Standing balance support: Bilateral upper extremity supported, During functional activity Standing balance-Leahy Scale: Poor Standing balance comment: Relies on Bil UE support and external support of 2 persons up to mod assist with better standing balance today    Special needs/care consideration BiPAP/CPAP n/a CPM n/a Continuous Drip IV PICC placed this admit Dialysis n/a Life Vest n/a Oxygen n/a Special Bed n/a Trach Size n/a Wound Vac n/a Skin      Nadara Mode, RN  Registered Nurse  WOC  Consult Note   Signed   Date of Service:  06/17/2018 11:04 AM            Signed         Show:Clear all [x] ?Manual[x] ?Template[] ?Copied  Added by: [x] ?Nadara Mode, RN  [] ?Hover for details [place] Nurse wound consult notecompleted by use of Elink and assistance of bedside nurse Reason for Consult:foot wound  Wound type:neuropathic foot ulceration in the presence of DM Xray did not indicate osteomyelitis, MD  to add MRI if needed. Pressure Injury POA: NA Measurement:3cm x 0.5cm x 0cm Wound KDX:IPJASNK area with hyperkeratotic skin along the proximal edge of the wound bed Drainage (amount, consistency, odor)none Peri wound:edema, circumferential erythema, requested bedside nurse to mark current status of erythema Dressing procedure/placement/frequency: No real topical therapy needed. Requested bedside nurse to protect area with silicone foam. Would suggest follow up with Dr. Irving Shows (she is followed for podiatry needs) as an outpatient.  Discussed POC with patient and bedside nurse.  Re consult if needed, will not follow at this time. Thanks Melody [employer] MSN, RN,CWOCN, CNS, CWON-AP [phone])              Moisture associated skin damage to perineum and groins; ecchymosis to arms; fissure to anus; moisture associated skin damage to breast; rash to genitalia Bowel mgmt: LBM 2/5 incontinent Bladder mgmt: external catheter Diabetic mgmt Hgb A1c 8.5 on 06/07/2018 Cefazolin via PICC with Last day 08/13/2018    Previous Home Environment Living Arrangements: Spouse/significant other(and daughter, [name])  Lives With: Spouse, Daughter Available Help at Discharge: Family, Available 24 hours/day Type of Home: House Home Layout: One level Home Access: Ramped entrance Bathroom Shower/Tub: Chiropodist: Standard Bathroom Accessibility: Yes How Accessible: Accessible via walker Home Care Services: No Additional Comments: able to ambualte with RW and SPC prior to admission   Discharge Living Setting Plans for Discharge Living Setting: Patient's home, Lives with (comment)(spouse and daughter) Type of Home at Discharge: House Discharge Home Layout: One level Discharge Home Access: Ramped entrance Discharge Bathroom Shower/Tub: Tub/shower unit, Curtain Discharge Bathroom Toilet: Standard Discharge Bathroom Accessibility: Yes How  Accessible: Accessible via  walker Does the patient have any problems obtaining your medications?: No  Social/Family/Support Systems Patient Roles: Spouse, Parent Contact Information: spouse,  John Anticipated Caregiver: spouse and daughter Anticipated Caregiver's Contact Information: spouse cell [phone] Ability/Limitations of Caregiver: no limitations Caregiver Availability: 24/7 Discharge Plan Discussed with Primary Caregiver: Yes Is Caregiver In Agreement with Plan?: Yes Does Caregiver/Family have Issues with Lodging/Transportation while Pt is in Rehab?: No  Goals/Additional Needs Patient/Family Goal for Rehab: supervision to min asisst with PT and OT Expected length of stay: ELOS 12 to 20 days Equipment Needs: IV antibiotics at d/c via PICC Pt/Family Agrees to Admission and willing to participate: Yes Program Orientation Provided & Reviewed with Pt/Caregiver Including Roles  & Responsibilities: Yes  Decrease burden of Care through IP rehab admission: n/a  Possible need for SNF placement upon discharge: not anticipated  Patient Condition: This patient's medical and functional status has changed since the consult dated 06/20/2018 in which the Rehabilitation Physician determined and documented that the patient was potentially appropriate for intensive rehabilitative care in an inpatient rehabilitation facility. Issues have been addressed and update has been discussed with Dr. Letta Pate  and patient now appropriate for inpatient rehabilitation. Will admit to inpatient rehab today.   Preadmission Screen Completed By:  Cleatrice Burke RN MSN, 06/26/2018 10:51 AM ______________________________________________________________________   Discussed status with Dr. Letta Pate on 06/26/2018 at  1051 and received telephone approval for admission today.  Admission Coordinator:  Cleatrice Burke RN MSN, time 1051 Date 06/26/2018           Cosigned by: Charlett Blake, MD at 06/26/2018 10:59 AM    Revision History

## 2018-06-26 NOTE — H&P (Signed)
Physical Medicine and Rehabilitation Admission H&P        Chief Complaint  Patient presents with  . Back Pain  : HPI: Kaitlin Vargas is a 68 year old right handed female history of hypertension, hyperlipidemia, B12 deficiency and diabetes mellitus. Patient was reported back pain 4 weeks. Per chart review patient lives with spouse. One level home with ramped entrance. Independent with assistive device prior to admission. She recently came to the hospital with cough was diagnosed with adenovirus in mid January received antibiotic coverage recovering nicely and was discharged to home. She continued to have back pain radiating to the lower extremities as well as bouts of urinary incontinence and stool incontinence. Present [facility] hospital 06/16/2018 with increasing back pain and a CT scan of the spine showed what appeared to be osteomyelitis. She was transferred to [facility] interventional radiology for lumbar puncture and during procedure noted increase active bleeding as well as cardiac arrest requiring CPR 90 seconds. She did not require intubation. He was felt cardiac arrest likely due to respiratory arrest following sedation. 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 Patient also with right foot ulceration. MRI of the foot negative for acute bony or joint abnormality. Small volume of fluid in the subcutaneous tissue deep to the first TMT joint. Infectious disease consulted patient with GPCs growing from lumbar aspirate. Placed on antibiotic therapy. TEE negative for vegetation. Current plans to continue cefazolin 6-8 weeks with in date of 08/13/2018. Subcutaneous heparin for DVT prophylaxis. Hospital course ongoing pain management. Acute on chronic anemia latest hemoglobin 9.9. Bouts of hyponatremia 130 and monitored. Therapy evaluations completed with recommendations of physical medicine rehabilitation  consult. Patient was admitted for a comprehensive rehabilitation program. Patient saw a podiatrist for left foot rash, was using a cream   Review of Systems  Constitutional: Negative for fever.  HENT: Negative for hearing loss.   Eyes: Negative for blurred vision and double vision.  Respiratory: Positive for cough and shortness of breath.   Cardiovascular: Positive for leg swelling. Negative for chest pain.  Gastrointestinal: Positive for constipation. Negative for nausea and vomiting.  Genitourinary: Negative for dysuria and hematuria.       Bladder incontinence  Musculoskeletal: Positive for back pain, joint pain and neck pain.  Skin: Negative for rash.  All other systems reviewed and are negative.       Past Medical History:  Diagnosis Date  . Anemia    . Diabetes mellitus without complication ([place])    . Hyperlipidemia    . Hypertension    . Peripheral neuropathy           Past Surgical History:  Procedure Laterality Date  . ABDOMINAL HYSTERECTOMY      . IR LUMBAR DISC ASPIRATION W/IMG GUIDE   06/18/2018         Family History  Problem Relation Age of Onset  . Cancer Mother    . Stroke Mother    . Cancer Father    . Leukemia Father      Social History:  reports that she has never smoked. She has never used smokeless tobacco. She reports that she does not drink alcohol or use drugs. Allergies:      Allergies  Allergen Reactions  . Sulfa Antibiotics Rash          Medications Prior to Admission  Medication Sig Dispense Refill  . albuterol (PROVENTIL HFA;VENTOLIN HFA) 108 (90 Base) MCG/ACT  inhaler Inhale 2 puffs into the lungs every 6 (six) hours as needed for wheezing or shortness of breath. 1 Inhaler 2  . aspirin 81 MG chewable tablet Chew 81 mg by mouth every morning.       . [EXPIRED] dextromethorphan-guaiFENesin (MUCINEX DM) 30-600 MG 12hr tablet Take 1 tablet by mouth 2 (two) times daily for 10 days. 20 tablet 0  . glipiZIDE (GLUCOTROL XL) 10 MG 24 hr tablet  Take 10 mg by mouth 2 (two) times daily.       Marland Kitchen lisinopril (PRINIVIL,ZESTRIL) 20 MG tablet Take 10 mg by mouth daily.       . metFORMIN (GLUCOPHAGE) 1000 MG tablet Take 1,000 mg by mouth 2 (two) times daily with a meal.       . metoprolol tartrate (LOPRESSOR) 25 MG tablet Take 25 mg by mouth daily.       . Multiple Vitamin (THERA) TABS Take 1 tablet by mouth daily.       . pantoprazole (PROTONIX) 40 MG tablet Take 1 tablet (40 mg total) by mouth daily. 30 tablet 1  . pioglitazone (ACTOS) 30 MG tablet Take 30 mg by mouth daily.       . vitamin B-12 (CYANOCOBALAMIN) 1000 MCG tablet Take 1 tablet (1,000 mcg total) by mouth daily for 30 days. 30 tablet 0      Drug Regimen Review Drug regimen was reviewed and remains appropriate with no significant issues identified   Home: Home Living Family/patient expects to be discharged to:: Inpatient rehab Living Arrangements: Spouse/significant other Available Help at Discharge: Family, Available 24 hours/day Type of Home: House Home Access: Ramped entrance Home Layout: One level Bathroom Shower/Tub: Chiropodist: Standard Bathroom Accessibility: Yes Home Equipment: Environmental consultant - 2 wheels, Cane - single point Additional Comments: able to ambualte with RW and SPC prior to admission   Lives With: Spouse   Functional History: Prior Function Level of Independence: Independent with assistive device(s)   Functional Status:  Mobility: Bed Mobility Overal bed mobility: Needs Assistance Bed Mobility: Rolling, Sidelying to Sit Rolling: Min assist Sidelying to sit: Mod assist Sit to supine: Mod assist General bed mobility comments: cues for log roll technique, increased time, assist to raise trunk Transfers Overall transfer level: Needs assistance Equipment used: Rolling walker (2 wheeled) Transfer via Lift Equipment: Stedy Transfers: Sit to/from Stand, Risk manager Sit to Stand: +2 physical assistance, Mod assist, From  elevated surface Stand pivot transfers: Max assist, +2 physical assistance General transfer comment: Pt was able to stand to [place] with mod assist for support.  Pt needed to be cleaned and nurse present and cleaned pt.  Pt did not buckle today and was able to safely sit on STedy and be moved to chair. STood 2 more times for about a minute each time.    Ambulation/Gait General Gait Details: deferred, will require +2 for safety and incontinent with standing    ADL: ADL Overall ADL's : Needs assistance/impaired Eating/Feeding: Independent Grooming: Wash/dry hands, Oral care, Brushing hair, Standing, Set up, Maximal assistance(with stedy at sink) Grooming Details (indicate cue type and reason): assist needed for brushing hair Upper Body Bathing: Minimal assistance Lower Body Bathing: Total assistance, Bed level Upper Body Dressing : Minimal assistance, Sitting Upper Body Dressing Details (indicate cue type and reason): front opening gown Lower Body Dressing: Total assistance Lower Body Dressing Details (indicate cue type and reason): for mesh underwear with 3 pads Toileting- Clothing Manipulation and Hygiene: Total assistance, +2 for physical  assistance, Sit to/from stand Functional mobility during ADLs: (per PT she is a 2 person transfer with steady to stand) General ADL Comments: pt with constant urinary incontinence   Cognition: Cognition Overall Cognitive Status: Impaired/Different from baseline Orientation Level: Oriented X4 Cognition Arousal/Alertness: Awake/alert Behavior During Therapy: Flat affect Overall Cognitive Status: Impaired/Different from baseline Area of Impairment: Following commands, Problem solving Following Commands: Follows one step commands with increased time Problem Solving: Slow processing, Decreased initiation, Difficulty sequencing, Requires verbal cues, Requires tactile cues   Physical Exam: Blood pressure 138/68, pulse (!) 102, temperature 98.4 F (36.9  C), temperature source Oral, resp. rate 18, height 4\' 10"  (1.473 m), weight 74.4 kg, SpO2 93 %. Physical Exam  Vitals reviewed. HENT:  Head: Normocephalic.  Eyes: EOM are normal.  Neck: Normal range of motion. Neck supple. No thyromegaly present.  Cardiovascular: Normal rate, regular rhythm and normal heart sounds.  Respiratory: Effort normal and breath sounds normal. No respiratory distress.  GI: Soft. Bowel sounds are normal. She exhibits no distension.  Neurological:  Patient is alert. Mood is a bit flat. Makes eye contact with examiner. Follow simple commands. Provides her name and age.  Skin:  Right foot ulceration is dressed.  Erythema at the distal left foot and toes scaly skin.  Motor strength is 4/5 bilateral deltoid bicep tricep grip hip flexor knee extensor ankle dorsiflexor and plantar flexor. Negative straight leg raise Sensation intact to light touch bilateral upper and lower limb Speech without dysarthria or aphasia.   Lab Results Last 48 Hours        Results for orders placed or performed during the hospital encounter of 06/16/18 (from the past 48 hour(s))  Glucose, capillary     Status: Abnormal    Collection Time: 06/24/18  7:46 AM  Result Value Ref Range    Glucose-Capillary 188 (H) 70 - 99 mg/dL  Glucose, capillary     Status: Abnormal    Collection Time: 06/24/18 11:33 AM  Result Value Ref Range    Glucose-Capillary 169 (H) 70 - 99 mg/dL  Glucose, capillary     Status: Abnormal    Collection Time: 06/24/18  4:52 PM  Result Value Ref Range    Glucose-Capillary 208 (H) 70 - 99 mg/dL  Glucose, capillary     Status: Abnormal    Collection Time: 06/24/18 10:19 PM  Result Value Ref Range    Glucose-Capillary 265 (H) 70 - 99 mg/dL  Basic metabolic panel     Status: Abnormal    Collection Time: 06/25/18  3:26 AM  Result Value Ref Range    Sodium 130 (L) 135 - 145 mmol/L    Potassium 3.8 3.5 - 5.1 mmol/L    Chloride 89 (L) 98 - 111 mmol/L    CO2 31 22 - 32  mmol/L    Glucose, Bld 189 (H) 70 - 99 mg/dL    BUN 18 8 - 23 mg/dL    Creatinine, Ser 0.60 0.44 - 1.00 mg/dL    Calcium 7.9 (L) 8.9 - 10.3 mg/dL    GFR calc non Af Amer >60 >60 mL/min    GFR calc Af Amer >60 >60 mL/min    Anion gap 10 5 - 15      Comment: Performed at [facility], [place] [address] [place], [place] [address]  Glucose, capillary     Status: Abnormal    Collection Time: 06/25/18  8:21 AM  Result Value Ref Range    Glucose-Capillary 215 (H) 70 -  99 mg/dL  Glucose, capillary     Status: Abnormal    Collection Time: 06/25/18 12:17 PM  Result Value Ref Range    Glucose-Capillary 178 (H) 70 - 99 mg/dL  Glucose, capillary     Status: Abnormal    Collection Time: 06/25/18  5:05 PM  Result Value Ref Range    Glucose-Capillary 239 (H) 70 - 99 mg/dL  Glucose, capillary     Status: Abnormal    Collection Time: 06/25/18  9:26 PM  Result Value Ref Range    Glucose-Capillary 137 (H) 70 - 99 mg/dL  Basic metabolic panel     Status: Abnormal    Collection Time: 06/26/18  3:23 AM  Result Value Ref Range    Sodium 131 (L) 135 - 145 mmol/L    Potassium 3.8 3.5 - 5.1 mmol/L    Chloride 89 (L) 98 - 111 mmol/L    CO2 30 22 - 32 mmol/L    Glucose, Bld 143 (H) 70 - 99 mg/dL    BUN 15 8 - 23 mg/dL    Creatinine, Ser 0.60 0.44 - 1.00 mg/dL    Calcium 8.2 (L) 8.9 - 10.3 mg/dL    GFR calc non Af Amer >60 >60 mL/min    GFR calc Af Amer >60 >60 mL/min    Anion gap 12 5 - 15      Comment: Performed at [facility], [place] [address], [place], [place] [address]      Imaging Results (Last 48 hours)  No results found.           Medical Problem List and Plan: 1.  Decreased functional mobility secondary to L1-2 discitis/ osteomyelitis as well as right foot ulceration complicated by cardiac arrest 2.  DVT Prophylaxis/Anticoagulation: Subcutaneous heparin. Check vascular studies 3. Pain Management: Hydrocodone,Robaxin as needed 4. Mood: Provide emotional support 5. Neuropsych:  This patient is capable of making decisions on her own behalf. 6. Skin/Wound Care/right foot wound ulceration:  Routine skin checks  Dressing changes as directed 7. Fluids/Electrolytes/Nutrition:  Routine in and out's with follow-up chemistries 8. MSSA bacteremia.IV Ancef until 08/13/2018. Follow-up per infectious disease 9. Acute on chronic anemia. Follow-up CBC 10. Diabetes mellitus with peripheral neuropathy. Hemoglobin A1c 8.7. Lantus insulin 25 units daily. Check blood sugars before meals and at bedtime 11. Hypertension. Lisinopril 10 mg daily,Lasix 40 mg daily, Lopressor 25 mg twice a day. Monitor with increased mobility 12. B12 deficiency.Continue vitamin B12   Post Admission Physician Evaluation: 1. Functional deficits secondary  to 1-L2 osteomyelitis and discitis as well as right foot diabetic ulcer. 2. Patient admitted to receive collaborative, interdisciplinary care between the physiatrist, rehab nursing staff, and therapy team. 3. Patient's level of medical complexity and substantial therapy needs in context of that medical necessity cannot be provided at a lesser intensity of care. 4. Patient has experienced substantial functional loss from his/her baseline..  Judging by the patient's diagnosis, physical exam, and functional history, the patient has potential for functional progress which will result in measurable gains while on inpatient rehab.  These gains will be of substantial and practical use upon discharge in facilitating mobility and self-care at the household level. 5. Physiatrist will provide 24 hour management of medical needs as well as oversight of the therapy plan/treatment and provide guidance as appropriate regarding the interaction of the two. 6. 24 hour rehab nursing will assist in the management of  bladder management, bowel management, safety, skin/wound care, disease management, medication administration, pain management  and patient education  and help integrate  therapy concepts, techniques,education, etc. 7. PT will assess and treat for:pre gait, gait training, endurance , safety, equipment, neuromuscular re education  .  Goals are: supervision. 8. OT will assess and treat for ADLs, Cognitive perceptual skills, Neuromuscular re education, safety, endurance, equipment  .  Goals are: supervision.  9. SLP will assess and treat for  .  Goals are: N/A. 10. Case Management and Social Worker will assess and treat for psychological issues and discharge planning. 11. Team conference will be held weekly to assess progress toward goals and to determine barriers to discharge. 12.  Patient will receive at least 3 hours of therapy per day at least 5 days per week. 13. ELOS and Prognosis: 12-16d good   "I have personally performed a face to face diagnostic evaluation of this patient.  Additionally, I have reviewed and concur with the physician assistant's documentation above."  Charlett Blake M.D. [facility] FAAPM&R (Sports Med, Neuromuscular Med) Diplomate Am Board of [place], PA-C 06/26/2018

## 2018-06-26 NOTE — Progress Notes (Signed)
Meredith Staggers, MD  Physician  Physical Medicine and Rehabilitation  Consult Note  Signed  Date of Service:  06/20/2018 6:39 AM       Related encounter: ED to Hosp-Admission (Current) from 06/16/2018 in [facility]      Signed      Expand All Collapse All    Show:Clear all [x] Manual[x] Template[] Copied  Added by: [x] Angiulli, Lavon Paganini, PA-C[x] Meredith Staggers, MD  [] Hover for details      Physical Medicine and Rehabilitation Consult Reason for Consult:  Decreased functional mobility Referring Physician:  [facility]   HPI: Kaitlin Vargas is a 68 y.o.right handed female with history of hypertension, hyperlipidemia, B12 deficiency,diabetes mellitus. Patient with reported back pain 4 weeks. Per chart review patient lives with spouse. One level home with ramped entrance. Independent with assistive device prior latest admission.She recently came to the hospital with cough was diagnosed with adenovirus in mid January and was discharged to home. She continued to have back pain radiating to the lower extremities as well as bouts of urinary incontinence and stool incontinence. Presented [facility] hospital 06/16/2018 with increasing back pain and a CT scan of the spine show what appeared to be osteomyelitis. She was transferred to [facility] interventional radiology for lumbar puncture and during procedure noted increase active bleeding and cardiac arrest requiring CPR 90 seconds. She did not require intubation. 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 Patient also with right foot ulceration. MRI of the foot negative for acute bony or joint abnormality. Small volume of fluid in the subcutaneous tissues deep to the first TMT joint. Infectious disease consulted patient with GPC's drawing from  lumbar aspirate. Placed on antibiotic therapy. TEE negative for vegetation. Plan is currently to continue cefazolin 16-8 weeks. Therapy evaluations completed with recommendations of physical medicine rehabilitation consult.   Review of Systems  Constitutional: Negative for chills and fever.  HENT: Negative for hearing loss.   Eyes: Negative for blurred vision and double vision.  Respiratory: Positive for cough and shortness of breath.   Cardiovascular: Positive for leg swelling. Negative for chest pain and palpitations.  Gastrointestinal: Positive for constipation. Negative for nausea and vomiting.  Genitourinary: Negative for dysuria, flank pain and hematuria.       Bladder incontinence  Musculoskeletal: Positive for back pain, joint pain and neck pain.  Skin: Negative for rash.  All other systems reviewed and are negative.      Past Medical History:  Diagnosis Date  . Anemia   . Diabetes mellitus without complication ([place])   . Hyperlipidemia   . Hypertension   . Peripheral neuropathy         Past Surgical History:  Procedure Laterality Date  . ABDOMINAL HYSTERECTOMY    . IR LUMBAR DISC ASPIRATION W/IMG GUIDE  06/18/2018        Family History  Problem Relation Age of Onset  . Cancer Mother   . Stroke Mother   . Cancer Father   . Leukemia Father    Social History:  reports that she has never smoked. She has never used smokeless tobacco. She reports that she does not drink alcohol or use drugs. Allergies:      Allergies  Allergen Reactions  . Sulfa Antibiotics Rash         Medications Prior to Admission  Medication Sig Dispense Refill  .  albuterol (PROVENTIL HFA;VENTOLIN HFA) 108 (90 Base) MCG/ACT inhaler Inhale 2 puffs into the lungs every 6 (six) hours as needed for wheezing or shortness of breath. 1 Inhaler 2  . aspirin 81 MG chewable tablet Chew 81 mg by mouth every morning.     . [EXPIRED] dextromethorphan-guaiFENesin (MUCINEX DM) 30-600 MG  12hr tablet Take 1 tablet by mouth 2 (two) times daily for 10 days. 20 tablet 0  . glipiZIDE (GLUCOTROL XL) 10 MG 24 hr tablet Take 10 mg by mouth 2 (two) times daily.     Marland Kitchen lisinopril (PRINIVIL,ZESTRIL) 20 MG tablet Take 10 mg by mouth daily.     . metFORMIN (GLUCOPHAGE) 1000 MG tablet Take 1,000 mg by mouth 2 (two) times daily with a meal.     . metoprolol tartrate (LOPRESSOR) 25 MG tablet Take 25 mg by mouth daily.     . Multiple Vitamin (THERA) TABS Take 1 tablet by mouth daily.     . pantoprazole (PROTONIX) 40 MG tablet Take 1 tablet (40 mg total) by mouth daily. 30 tablet 1  . pioglitazone (ACTOS) 30 MG tablet Take 30 mg by mouth daily.     . vitamin B-12 (CYANOCOBALAMIN) 1000 MCG tablet Take 1 tablet (1,000 mcg total) by mouth daily for 30 days. 30 tablet 0    Home: Home Living Family/patient expects to be discharged to:: Private residence Living Arrangements: Spouse/significant other Available Help at Discharge: Family, Available 24 hours/day Type of Home: House Home Access: Ramped entrance Home Layout: One level Bathroom Shower/Tub: Chiropodist: Standard Home Equipment: Environmental consultant - 2 wheels, Cane - single point Additional Comments: able to ambualte with RW and SPC prior to admission   Functional History: Prior Function Level of Independence: Independent with assistive device(s) Functional Status:  Mobility: Bed Mobility Overal bed mobility: Needs Assistance Bed Mobility: Rolling, Sidelying to Sit Rolling: Mod assist Sidelying to sit: Mod assist General bed mobility comments: ModA to roll with good form and coordination, ModA to power up trunk to sitting upright at EOB, once up able to maintain balance with S  Transfers Overall transfer level: Needs assistance Equipment used: Rolling walker (2 wheeled) Transfers: Sit to/from Stand, Stand Pivot Transfers Sit to Stand: Max assist Stand pivot transfers: Min assist General transfer comment:  MaxA with RW, cues for safety and sequencing; once on feet able to take pivotal steps with MinA for balance with RW  Ambulation/Gait General Gait Details: deferred, will require +2 for safety and incontinent with standing   ADL:  Cognition: Cognition Overall Cognitive Status: Within Functional Limits for tasks assessed Orientation Level: Oriented X4 Cognition Arousal/Alertness: Awake/alert Behavior During Therapy: WFL for tasks assessed/performed Overall Cognitive Status: Within Functional Limits for tasks assessed  Blood pressure (!) 177/89, pulse (!) 104, temperature 98.2 F (36.8 C), temperature source Oral, resp. rate 20, height 4\' 10"  (1.473 m), weight 76.2 kg, SpO2 96 %. Physical Exam  Constitutional: She appears distressed.  Frail appearing  HENT:  Head: Normocephalic and atraumatic.  Eyes: Pupils are equal, round, and reactive to light.  Neck: No tracheal deviation present.  Cardiovascular: Normal rate.  Respiratory: Effort normal.  GI: Soft.  Musculoskeletal:        General: Tenderness and edema present.     Comments: Groaning frequently during spasms of back  Neurological:  Patient is alert and follow simple commands. Oriented to person, place and time. Can move all 4 but is limited by pain  Skin:  Right foot ulceration is dry.  Psychiatric:  flat    LabResultsLast24Hours        Results for orders placed or performed during the hospital encounter of 06/16/18 (from the past 24 hour(s))  Glucose, capillary     Status: Abnormal   Collection Time: 06/19/18  7:16 AM  Result Value Ref Range   Glucose-Capillary 180 (H) 70 - 99 mg/dL   Comment 1 Notify RN    Comment 2 Document in Chart   Glucose, capillary     Status: Abnormal   Collection Time: 06/19/18 11:18 AM  Result Value Ref Range   Glucose-Capillary 207 (H) 70 - 99 mg/dL   Comment 1 Notify RN    Comment 2 Document in Chart   Aerobic Culture (superficial specimen)     Status: None  (Preliminary result)   Collection Time: 06/19/18  1:04 PM  Result Value Ref Range   Specimen Description WOUND    Special Requests PERINEAL    Gram Stain      NO WBC SEEN FEW YEAST Performed at [facility], [address], [place], [place] [address]    Culture PENDING    Report Status PENDING   Glucose, capillary     Status: Abnormal   Collection Time: 06/19/18  4:58 PM  Result Value Ref Range   Glucose-Capillary 303 (H) 70 - 99 mg/dL   Comment 1 Notify RN   Glucose, capillary     Status: Abnormal   Collection Time: 06/19/18  9:07 PM  Result Value Ref Range   Glucose-Capillary 220 (H) 70 - 99 mg/dL   Comment 1 Notify RN    Comment 2 Document in Chart       ImagingResults(Last48hours)  Mr Foot Right Wo Contrast  Result Date: 06/18/2018 CLINICAL DATA:  Right forefoot pain and swelling in a diabetic patient for 6 weeks. No known injury. EXAM: MRI OF THE RIGHT FOREFOOT WITHOUT CONTRAST TECHNIQUE: Multiplanar, multisequence MR imaging of the right forefoot was performed. No intravenous contrast was administered. COMPARISON:  Plain films right foot 06/16/2018 FINDINGS: Bones/Joint/Cartilage No acute bony or joint abnormality is identified. There is no bone marrow signal change to suggest osteomyelitis. Severe degenerative disease about the tarsometatarsal joints is worst at the first TMT joint. Ligaments Intact. Muscles and Tendons Intact. No intramuscular fluid collection. Fatty atrophy of intrinsic musculature the foot is noted. Soft tissues Small volume of fluid subjacent to the first tarsometatarsal joint measures 1 cm transverse by 1.2 cm craniocaudal by 1.1 cm long and likely represents an adventitial bursa. IMPRESSION: Negative for acute bony or joint abnormality. Advanced osteoarthritis about the tarsometatarsal joints appears worst at the first TMT joint. Small volume of fluid in the subcutaneous tissues deep to the first TMT joint likely  represents adventitial bursa formation. Abscess is possible but thought less likely. Electronically Signed   By: Inge Rise M.D.   On: 06/18/2018 10:24   Ir Lumbar Disc Aspiration W/img Guide  Result Date: 06/18/2018 INDICATION: Diabetes, bacteremia, low back pain, suspected discitis L1-2 on recent MR. EXAM: LUMBAR L1-2 DISC ASPIRATION UNDER FLUOROSCOPY MEDICATIONS: Lidocaine 1% subcutaneous ANESTHESIA/SEDATION: Intravenous Fentanyl and Versed were administered as conscious sedation during continuous monitoring of the patient's level of consciousness and physiological / cardiorespiratory status by the radiology RN, with a total moderate sedation time of 15 minutes. PROCEDURE: Informed written consent was obtained from the patient after a thorough discussion of the procedural risks, benefits and alternatives. All questions were addressed. Maximal Sterile Barrier Technique was utilized including caps, mask, sterile  gowns, sterile gloves, sterile drape, hand hygiene and skin antiseptic. A timeout was performed prior to the initiation of the procedure. Patient placed prone. The appropriate interspace was identified under fluoroscopy, corresponding to previous cross-sectional imaging. An appropriate skin entry site was determined. After local infiltration with 1% lidocaine, a 17 gauge trocar needle was advanced into the lumbar L1-2 interspace from left posterolateral extraforaminal approach. Needle tip position within the interspace confirmed on biplane images. Approximately 2 mL of thin purulent-appearing material were aspirated, sent for the requested laboratory studies. Towards the end of the case, accurate O2 sats were not being displayed. Bradycardia was identified. The patient was urgently turned supine onto the stretcher. Apnea was noted. Resuscitation efforts were initiated, including Narcan, with return of spontaneous breathing, good circulation, patient able to follow commands, pain-free. Patient  was transferred to the ICU for observation. FLUOROSCOPY TIME:  54 seconds; 57 mGy COMPLICATIONS: Transient apnea and bradycardia, presumably sedation related. SIR level B: Nominal therapy (including overnight admission for observation), no consequence. IMPRESSION: Technically successful lumbar L1-2 disc aspiration under fluoroscopy. Electronically Signed   By: Lucrezia Europe M.D.   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 Does the need for close, 24 hr/day medical supervision in concert with the patient's rehab needs make it unreasonable for this patient to be served in a less intensive setting? Yes and Potentially 2. Co-Morbidities requiring supervision/potential complications: pain control, wound care, ID considerations, nutrition 3. Due to bladder management, bowel management, safety, skin/wound care, disease management, medication administration, pain management and patient education, does the patient require 24 hr/day rehab nursing? Yes 4. Does the patient require coordinated care of a physician, rehab nurse, PT (1-2 hrs/day, 5 days/week) and OT (1-2 hrs/day, 5 days/week) to address physical and functional deficits in the context of the above medical diagnosis(es)? Yes Addressing deficits in the following areas: balance, endurance, locomotion, strength, transferring, bowel/bladder control, bathing, dressing, feeding, grooming, toileting and psychosocial support 5. Can the patient actively participate in an intensive therapy program of at least 3 hrs of therapy per day at least 5 days per week? Potentially 6. The potential for patient to make measurable gains while on inpatient rehab is good and fair 7. Anticipated functional outcomes upon discharge from inpatient rehab are supervision  with PT, supervision and min assist with OT, n/a with SLP. 8. Estimated rehab length of stay to reach the above functional goals is: 12-20 days  potentially 9. Anticipated D/C setting: Home 10. Anticipated post D/C treatments: HH therapy and Outpatient therapy 11. Overall Rehab/Functional Prognosis: good  RECOMMENDATIONS: This patient's condition is appropriate for continued rehabilitative care in the following setting: see below Patient has agreed to participate in recommended program. Yes Note that insurance prior authorization may be required for reimbursement for recommended care.  Comment: Pt's pain is limiting at this point. Having significant spasms this morning. I ordered scheduled robaxin to help.  Will need to see improved activity tolerance to consider inpatient rehab. Rehab Admissions Coordinator to follow up.  Thanks,  Meredith Staggers, MD, Mellody Drown  I have personally performed a face to face diagnostic evaluation of this patient. Additionally, I have reviewed and concur with the physician assistant's documentation above.   Lavon Paganini Angiulli, PA-C 06/20/2018        Revision History                        Routing History

## 2018-06-26 NOTE — Plan of Care (Signed)
  Problem: Education: Goal: Knowledge of General Education information will improve Description Including pain rating scale, medication(s)/side effects and non-pharmacologic comfort measures Outcome: Adequate for Discharge   Problem: Health Behavior/Discharge Planning: Goal: Ability to manage health-related needs will improve Outcome: Adequate for Discharge   Problem: Clinical Measurements: Goal: Ability to maintain clinical measurements within normal limits will improve Outcome: Adequate for Discharge Goal: Will remain free from infection Outcome: Adequate for Discharge Goal: Respiratory complications will improve Outcome: Adequate for Discharge Goal: Cardiovascular complication will be avoided Outcome: Adequate for Discharge   Problem: Activity: Goal: Risk for activity intolerance will decrease Outcome: Adequate for Discharge   Problem: Nutrition: Goal: Adequate nutrition will be maintained Outcome: Adequate for Discharge   Problem: Coping: Goal: Level of anxiety will decrease Outcome: Adequate for Discharge   Problem: Elimination: Goal: Will not experience complications related to bowel motility Outcome: Adequate for Discharge Goal: Will not experience complications related to urinary retention Outcome: Adequate for Discharge   Problem: Pain Managment: Goal: General experience of comfort will improve Outcome: Adequate for Discharge   Problem: Safety: Goal: Ability to remain free from injury will improve Outcome: Adequate for Discharge   Problem: Skin Integrity: Goal: Risk for impaired skin integrity will decrease Outcome: Adequate for Discharge   Problem: Education: Goal: Ability to describe self-care measures that may prevent or decrease complications (Diabetes Survival Skills Education) will improve Outcome: Adequate for Discharge   Problem: Coping: Goal: Ability to adjust to condition or change in health will improve Outcome: Adequate for Discharge    Problem: Fluid Volume: Goal: Ability to maintain a balanced intake and output will improve Outcome: Adequate for Discharge   Problem: Health Behavior/Discharge Planning: Goal: Ability to identify and utilize available resources and services will improve Outcome: Adequate for Discharge Goal: Ability to manage health-related needs will improve Outcome: Adequate for Discharge   Problem: Metabolic: Goal: Ability to maintain appropriate glucose levels will improve Outcome: Adequate for Discharge   Problem: Nutritional: Goal: Maintenance of adequate nutrition will improve Outcome: Adequate for Discharge Goal: Progress toward achieving an optimal weight will improve Outcome: Adequate for Discharge   Problem: Skin Integrity: Goal: Risk for impaired skin integrity will decrease Outcome: Adequate for Discharge   Problem: Tissue Perfusion: Goal: Adequacy of tissue perfusion will improve Outcome: Adequate for Discharge   Problem: Education: Goal: Ability to demonstrate management of disease process will improve Outcome: Adequate for Discharge Goal: Ability to verbalize understanding of medication therapies will improve Outcome: Adequate for Discharge Goal: Individualized Educational Video(s) Outcome: Adequate for Discharge   Problem: Activity: Goal: Capacity to carry out activities will improve Outcome: Adequate for Discharge   Problem: Cardiac: Goal: Ability to achieve and maintain adequate cardiopulmonary perfusion will improve Outcome: Adequate for Discharge

## 2018-06-26 NOTE — Progress Notes (Signed)
Inpatient Rehabilitation Admissions Coordinator  I met with patient at bedside as she sat in chair. Reports pain management better but continues with back spasms. She is in agreement to admit to inpt rehab today. I contacted Dr. Tawanna Solo for d/c, RN CM, and RN. I will make the arrangements to admit pt today.  Danne Baxter, RN, MSN Rehab Admissions Coordinator [phone] 06/26/2018 10:59 AM

## 2018-06-26 NOTE — Evaluation (Addendum)
Occupational Therapy Assessment and Plan  Patient Details  Name: Kaitlin Vargas MRN: [id number] Date of Birth: 23-May-1950  OT Diagnosis: acute pain, lumbago (low back pain) and muscle weakness (generalized) Rehab Potential: Rehab Potential (ACUTE ONLY): Good ELOS: 14-18 days   Today's Date: 06/27/2018 Session 1 OT Individual Time: 1000-1115 OT Individual Time Calculation (min): 75 min     Session 2 OT Individual Time: 1300-1345 OT Individual Time Calculation (min): 45 min    OT Missed time: 15 mins 2/2 pain and fatigue  Problem List:  Patient Active Problem List   Diagnosis Date Noted  . Lumbar discitis 06/26/2018  . Cardiac arrest, cause unspecified ([place])   . Acute encephalopathy   . MSSA bacteremia 06/17/2018  . 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 DKA (diabetic ketoacidosis) ([place]) 06/16/2018  . Diabetic foot ulcer ([place]) 06/16/2018  . Acute hypoxemic respiratory failure ([place]) 06/07/2018  . Essential hypertension 06/07/2018  . Type 2 diabetes mellitus with hyperlipidemia ([place]) 06/07/2018  . Anemia 06/07/2018  . Acute bronchitis 06/07/2018    Past Medical History:  Past Medical History:  Diagnosis Date  . Anemia   . Diabetes mellitus without complication ([place])   . Hyperlipidemia   . Hypertension   . Peripheral neuropathy    Past Surgical History:  Past Surgical History:  Procedure Laterality Date  . ABDOMINAL HYSTERECTOMY    . IR LUMBAR DISC ASPIRATION W/IMG GUIDE  06/18/2018    Assessment & Plan Clinical Impression: Patient is a 68 y.o. year old female with recent admission to the hospital on 06/16/18 with back pain. 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 Transferred to [facility] for lumbar puncture on 1/29; during procedure, pt with active bleeding and cardiac arrest requiring CPR. PMH includes peripheral neuropathy, HTN, DM. Patient transferred to CIR on 06/26/2018 .    Patient currently requires max with basic self-care skills secondary to muscle weakness and decreased sitting balance,  decreased standing balance, decreased postural control, decreased balance strategies and difficulty maintaining precautions.  Prior to hospitalization, patient could complete BADL with modified independent .  Patient will benefit from skilled intervention to increase independence with basic self-care skills prior to discharge home with care partner.  Anticipate patient will require 24 hour supervision and follow up home health.  OT - End of Session Endurance Deficit: Yes Endurance Deficit Description: Multiple rest breaks within BADL tasks OT Assessment Rehab Potential (ACUTE ONLY): Good OT Patient demonstrates impairments in the following area(s): Balance;Endurance;Motor;Pain;Sensory;Safety OT Basic ADL's Functional Problem(s): Toileting;Dressing;Eating;Grooming;Bathing OT Transfers Functional Problem(s): Tub/Shower;Toilet OT Additional Impairment(s): None OT Plan OT Intensity: Minimum of 1-2 x/day, 45 to 90 minutes OT Duration/Estimated Length of Stay: 14-18 days OT Treatment/Interventions: Medical illustrator training;Community reintegration;Discharge planning;Disease mangement/prevention;DME/adaptive equipment instruction;Functional mobility training;Neuromuscular re-education;Pain management;Patient/family education;Psychosocial support;Self Care/advanced ADL retraining;Therapeutic Activities;Therapeutic Exercise;UE/LE Strength taining/ROM;UE/LE Coordination activities;Wheelchair propulsion/positioning OT Self Feeding Anticipated Outcome(s): Independent OT Basic Self-Care Anticipated Outcome(s): Supervision OT Toileting Anticipated Outcome(s): Supervision OT Bathroom Transfers Anticipated Outcome(s): Supervision OT Recommendation Patient destination: Home Follow Up Recommendations: Home health OT Equipment Recommended: To be determined Equipment Details: Likely a tub transfer bench  Skilled Therapeutic Intervention Session 1 Initial eval completed with treatment provided to address  functional transfers, back precautions, improved sit<>stand, standing tolerance, and adapted bathing/dressing skills. Pt with 8/10 pain throughout session, but participated in therapy despite pain. UB bathing/dressing completed with min A and difficulty leaning forward in wc 2/2 pain. Sit<>stand from wc with mod/max A and tolerated standing for 1 minute while OT assisted with washing buttocks and pulling up pants. Pt with forward  flexed posture and unable to achieve full trunk extension when standing at the sink. Total A for LB ADLs 2/2 back pain and weakness. Pt left seated in wc at end of session with alarm belt on and needs met.   Session 2 Pt greeted sitting in wc and agreeable to OT treatment session. Pt declined to use the bathroom when asked by therapist. OT brought pt to therapy apartment and educated on tub transfer bench for safety getting in/out of her tub at home. Pt verbalized understanding but declined to practice at this time. OT provided pt with youth size RW 2/2 height. Blocked practice of stand-pivot transfers with RW to and from therapy mat. Pt needed mod A for stand-pivot w/ RW for first 2 transfers. Max A to achieve stand on 3 and 4 transfer and mod/max A to pivot. Pt reported 10/10 pain and declined further activities. Pt returned to room and RN notified of 10/10 pain. Attempted sit<>stand to get back to bed, but pt unable to stand with max A from OT. Stedy used to transfer pt back to bed with max A to lift into stedy. Max A to return to supine. Nursing noted pt incontinent of bladder and OT assisted pt with rolling for bed pan placement. Pt left on bed pan with nursing present and needs met.   OT Evaluation Precautions/Restrictions  Precautions Precautions: Fall;Back Precaution Comments: back precautions for comfort Restrictions Weight Bearing Restrictions: No General: Pt missed 15 mins of OT 2/2 pain and fatigue Pain Pain Assessment Faces Pain Scale: Hurts whole lot Pain Type:  Acute pain Pain Location: Back Pain Orientation: Left;Right;Mid Pain Descriptors / Indicators: Aching;Shooting Pain Onset: On-going Pain Intervention(s): RN made aware;Repositioned Home Living/Prior [place] expects to be discharged to:: Private residence Living Arrangements: Spouse/significant other, Children Available Help at Discharge: Family, Available 24 hours/day Type of Home: House Home Access: Ramped entrance Home Layout: One level Bathroom Shower/Tub: Chiropodist: Standard Bathroom Accessibility: Yes Additional Comments: Ambulated with cane and RW PTA  Lives With: Spouse, Daughter IADL History Leisure and Hobbies: Enjoys going to shops and out to eat IADL Comments: Per pt report, independent with homemaking Prior Function Level of Independence: Independent with basic ADLs, Independent with homemaking with ambulation  Able to Take Stairs?: No Driving: No Vocation: Retired ADL ADL Eating: Set up Grooming: Setup Upper Body Bathing: Minimal assistance Lower Body Bathing: Maximal assistance Upper Body Dressing: Minimal assistance Lower Body Dressing: Dependent Toileting: Not assessed Toilet Transfer: Maximal assistance Toilet Transfer Method: Stand pivot Vision Baseline Vision/History: Wears glasses Wears Glasses: Reading only Patient Visual Report: No change from baseline  Cognition Overall Cognitive Status: No family/caregiver present to determine baseline cognitive functioning Arousal/Alertness: Awake/alert Orientation Level: Person;Place;Situation Person: Oriented Place: Oriented Situation: Oriented Year: 2020 Month: February Day of Week: Incorrect Memory: Appears intact Immediate Memory Recall: Sock;Blue;Bed Memory Recall: Sock;Blue Memory Recall Sock: Without Cue Memory Recall Blue: Without Cue Safety/Judgment: Appears intact Sensation Sensation Light Touch: Impaired Detail Peripheral sensation  comments: peripheral neuropathy bilat feet Light Touch Impaired Details: Impaired RLE;Impaired LLE Proprioception: Impaired Detail Proprioception Impaired Details: Impaired RLE;Impaired LLE Coordination Gross Motor Movements are Fluid and Coordinated: Yes Motor  Motor Motor - Skilled Clinical Observations: generalized weakness Mobility  Transfers Sit to Stand: Moderate Assistance - Patient 50-74% Stand to Sit: Moderate Assistance - Patient 50-74%  Trunk/Postural Assessment  Cervical Assessment Cervical Assessment: (fwd head) Thoracic Assessment Thoracic Assessment: (mild kyphosis) Lumbar Assessment Lumbar Assessment: (posterior pelvic tilt) Postural Control Postural  Control: Deficits on evaluation Righting Reactions: delayed  Balance Balance Balance Assessed: Yes Static Sitting Balance Static Sitting - Level of Assistance: 5: Stand by assistance Dynamic Sitting Balance Dynamic Sitting - Balance Support: During functional activity Dynamic Sitting - Level of Assistance: 4: Min assist Static Standing Balance Static Standing - Balance Support: During functional activity;Bilateral upper extremity supported Static Standing - Level of Assistance: 3: Mod assist Dynamic Standing Balance Dynamic Standing - Balance Support: During functional activity;Bilateral upper extremity supported Dynamic Standing - Level of Assistance: 3: Mod assist;2: Max assist Extremity/Trunk Assessment RUE Assessment RUE Assessment: Exceptions to [facility] General Strength Comments: generalized weakness 3+/5 overall LUE Assessment LUE Assessment: Exceptions to [facility] General Strength Comments: generalized weakness 3+/5 overall    Refer to Care Plan for Long Term Goals  Recommendations for other services: None    Discharge Criteria: Patient will be discharged from OT if patient refuses treatment 3 consecutive times without medical reason, if treatment goals not met, if there is a change in medical status, if  patient makes no progress towards goals or if patient is discharged from hospital.  The above assessment, treatment plan, treatment alternatives and goals were discussed and mutually agreed upon: by patient  Valma Cava 06/27/2018, 1:54 PM

## 2018-06-26 NOTE — Discharge Summary (Signed)
Physician Discharge Summary  Kaitlin Vargas JJO:[id number] DOB: 17-Mar-1951 DOA: 06/16/2018  PCP: [name], MD  Admit date: 06/16/2018 Discharge date: 06/26/2018  Admitted From: Home Disposition: CIR  Discharge Condition:Stable CODE STATUS:FULLe Diet recommendation: Heart Healthy   Brief/Interim Summary: Patient is a 68 year old female who was admitted initially at [facility]  for back pain.  She was discharged from there on 06/10/2019 when she was treated for acute hypoxemic respiratory failure secondary to acute bronchitis.  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  She was also found to have a ulcer on her right foot.  She was transferred to [facility] for IR guided drainage of the abscess..  During the procedure ,she received fentanyl and Versed but she underwent cardiac arrest.  She required CPR of 90 seconds with return of spontaneous circulation.  Did not require intubation.  She was admitted to pulmonary critical care service.  She was transferred to hospitalist service on 06/20/18. Her blood cultures also showed MSSA.  Currently on cefazolin.  No evidence of endocarditis.  ID recommended antibiotics per 6-8 weeks.  Underwent PICC placement .  Planning for discharge to CIR. She  was volume overloaded so started on Lasix. She is stable for discharge to CIR today.  Following problems were addressed during her hospitalization:  MSSA bacteremia: ID recommending cefazolin for total of 8 weeks. PlCC placed. Blood cultures sent on 1/29 remain negative.  End date of antibiotic will be on March 25. She was found to have osteomyelitis/discitis of the lumbar spine.  Disc aspiration showed gram-positive cocci on stain.  Urine culture also showed Staphylococcus aureus.  Echocardiogram did not show any vegetation.  Status post cardiac arrest: While at interventional radiology.  A brief CPR for 90 seconds.  Did not need  intubation.  Severe back pain: Secondary from osteomyelitis/discitis of the lumbar spine.  Continue pain management.  Acute respiratory failure/dyspnea/cough: Chest x-ray  showed pulmonary congestion with findings of CHF.  Echocardiogram done here showed normal left ventricular ejection fraction.  Likely diastolic CHF.  She might have been volume overloaded due to fluid resuscitation after cardiac arrest. She was started  Lasix 40 mg IV every 12 with good response. Improving  bilateral lower extremity edema. Respiratory status stable.Continue lasix 20 mg daily for now.  Poorly controlled diabetes mellitus/DKA: She was found to have DKA on admission.  Continue current insulin regimen.  She was on oral medications at home which we will continue on discharge.  She was on long-acting and sliding scale insulin here.  HbA1C of 8.5 as per 1/18.  Right foot wound: Improving with antibiotics.  Likely associated with diabetic ulcer.Resolved.  MRI of the right foot did not show any osteomyelitis, fracture or dislocation.   Hypertension: Home medications restarted.BP stable.  Deconditioning/debility: PT evaluated her and recommended CIR on discharge  Hyponatremia/hypokalemia:Likely hypervolemic hyponatremia. Potassium supplemented.Anticipate improvement with diuresis.   Discharge Diagnoses:  Principal Problem:   MSSA bacteremia Active Problems:   Essential hypertension   Type 2 diabetes mellitus with hyperlipidemia ([place])   Diskitis   DKA (diabetic ketoacidosis) ([place])   Diabetic foot ulcer ([place])   Cardiac arrest, cause unspecified ([place])   Acute encephalopathy    Discharge Instructions  Discharge Instructions    Diet - low sodium heart healthy   Complete by:  As directed    Discharge instructions   Complete by:  As directed    1) check CBC and BMP in a  week. 2)Check hemoglobin A1c in 3 months. 3)Follow up with infectious disease as an outpatient. 4)Follow up with endocrinology for  the management of her diabetes.   Home infusion instructions [facility] May follow [place] Dosing Protocol; May administer Cathflo as needed to maintain patency of vascular access device.; Flushing of vascular access device: per [facility] Protocol: 0.9% NaCl pre/post medica...   Complete by:  As directed    Instructions:  May follow [place] Dosing Protocol   Instructions:  May administer Cathflo as needed to maintain patency of vascular access device.   Instructions:  Flushing of vascular access device: per [facility] Protocol: 0.9% NaCl pre/post medication administration and prn patency; Heparin 100 u/ml, 92m for implanted ports and Heparin 10u/ml, 517mfor all other central venous catheters.   Instructions:  May follow [facility] Anaphylaxis Protocol for First Dose Administration in the home: 0.9% NaCl at 25-50 ml/hr to maintain IV access for protocol meds. Epinephrine 0.3 ml IV/IM PRN and Benadryl 25-50 IV/IM PRN s/s of anaphylaxis.   Instructions:  AdDorchesternfusion Coordinator (RN) to assist per patient IV care needs in the home PRN.   Increase activity slowly   Complete by:  As directed      Allergies as of 06/26/2018      Reactions   Sulfa Antibiotics Rash      Medication List    TAKE these medications   albuterol 108 (90 Base) MCG/ACT inhaler Commonly known as:  PROVENTIL HFA;VENTOLIN HFA Inhale 2 puffs into the lungs every 6 (six) hours as needed for wheezing or shortness of breath.   aspirin 81 MG chewable tablet Chew 81 mg by mouth every morning.   ceFAZolin  IVPB Commonly known as:  ANCEF Inject 2 g into the vein every 8 (eight) hours. Indication: MSSA discitis/osteomyelitis Last Day of Therapy: 08/13/2018 Labs - Once weekly:  CBC/D and BMP, Labs - Every other week:  ESR and CRP   dextromethorphan-guaiFENesin 30-600 MG 12hr tablet Commonly known as:  MUCINEX DM Take 1 tablet by mouth 2 (two) times daily for 10 days.   furosemide 20 MG tablet Commonly known as:   LASIX Take 2 tablets (40 mg total) by mouth daily. Start taking on:  June 27, 2018   glipiZIDE 10 MG 24 hr tablet Commonly known as:  GLUCOTROL XL Take 10 mg by mouth 2 (two) times daily.   HYDROcodone-acetaminophen 10-325 MG tablet Commonly known as:  NORCO Take 1 tablet by mouth every 4 (four) hours as needed for moderate pain.   lisinopril 20 MG tablet Commonly known as:  PRINIVIL,ZESTRIL Take 10 mg by mouth daily.   metFORMIN 1000 MG tablet Commonly known as:  GLUCOPHAGE Take 1,000 mg by mouth 2 (two) times daily with a meal.   methocarbamol 500 MG tablet Commonly known as:  ROBAXIN Take 1 tablet (500 mg total) by mouth every 6 (six) hours as needed for muscle spasms.   metoprolol tartrate 25 MG tablet Commonly known as:  LOPRESSOR Take 1 tablet (25 mg total) by mouth 2 (two) times daily. What changed:  when to take this   pantoprazole 40 MG tablet Commonly known as:  PROTONIX Take 1 tablet (40 mg total) by mouth daily.   pioglitazone 30 MG tablet Commonly known as:  ACTOS Take 30 mg by mouth daily.   THERA Tabs Take 1 tablet by mouth daily.   vitamin B-12 1000 MCG tablet Commonly known as:  CYANOCOBALAMIN Take 1 tablet (1,000 mcg total) by  mouth daily for 30 days.            Home Infusion Instuctions  (From admission, onward)         Start     Ordered   06/26/18 0000  Home infusion instructions [facility] May follow [place] Dosing Protocol; May administer Cathflo as needed to maintain patency of vascular access device.; Flushing of vascular access device: per [facility] Protocol: 0.9% NaCl pre/post medica...    Question Answer Comment  Instructions May follow [place] Dosing Protocol   Instructions May administer Cathflo as needed to maintain patency of vascular access device.   Instructions Flushing of vascular access device: per [facility] Protocol: 0.9% NaCl pre/post medication administration and prn patency; Heparin 100 u/ml, 55m for implanted  ports and Heparin 10u/ml, 569mfor all other central venous catheters.   Instructions May follow [facility] Anaphylaxis Protocol for First Dose Administration in the home: 0.9% NaCl at 25-50 ml/hr to maintain IV access for protocol meds. Epinephrine 0.3 ml IV/IM PRN and Benadryl 25-50 IV/IM PRN s/s of anaphylaxis.   Instructions [facility] Infusion Coordinator (RN) to assist per patient IV care needs in the home PRN.      06/26/18 1045         Follow-up Information    Comer, RoOkey RegalMD Follow up on 07/15/2018.   Specialty:  Infectious Diseases Why:  2:30 pm appointment. Please arrive 15 minutes early to register.  Contact information: [address] [place] [id number]        Allergies  Allergen Reactions  . Sulfa Antibiotics Rash    Consultations:  ID,PCCM   Procedures/Studies: Dg Chest 2 View  Result Date: 06/07/2018 CLINICAL DATA:  Cough and shortness of breath EXAM: CHEST - 2 VIEW COMPARISON:  None. FINDINGS: There is atelectatic change in the right mid lung and left base regions. There is no edema or consolidation. Heart is borderline enlarged with pulmonary vascularity normal. No adenopathy. There is calcification in the left anterior descending coronary artery. There is degenerative change in the thoracic spine. IMPRESSION: Areas of atelectatic change. No edema or consolidation. Heart borderline enlarged with foci coronary artery calcification. No adenopathy. Electronically Signed   By: WiLowella GripII M.D.   On: 06/07/2018 11:49   Mr Lumbar Spine W Wo Contrast  Result Date: 06/16/2018 CLINICAL DATA:  Low back pain. Findings concerning for L1-2 discitis-osteomyelitis on abdominal CT. EXAM: MRI LUMBAR SPINE WITHOUT AND WITH CONTRAST TECHNIQUE: Multiplanar and multiecho pulse sequences of the lumbar spine were obtained without and with intravenous contrast. CONTRAST:  7 mL Gadavist COMPARISON:  CT abdomen and pelvis 06/16/2018 FINDINGS:  Segmentation:  Standard. Alignment:  Normal. Vertebrae: There are erosive changes involving the L2 superior greater than L1 inferior endplates with extensive marrow edema and enhancement in the L2 greater than L1 vertebral bodies. There is asymmetric moderate disc space loss at L1-2. There is at most minimal ventral epidural phlegmon at L1-2 without evidence of epidural abscess. Bone marrow signal elsewhere is mildly heterogeneous without a suspicious focal lesion identified. No fracture is evident. Conus medullaris and cauda equina: Conus extends to the upper L2 level. Conus and cauda equina appear normal. Paraspinal and other soft tissues: Extensive right-sided and milder left-sided paravertebral soft tissue inflammation centered at L1-2. Partially visualized rim enhancing fluid collections on the right extending from L1-2 superiorly into the region of the crus of the right hemidiaphragm with the larger collection measuring approximately 5  cm in length. Disc levels: T12-L1: Negative. L1-2: Discitis-osteomyelitis as described above. Mild left neural foraminal narrowing. No spinal stenosis. L2-3: Mild left facet hypertrophy without disc herniation or stenosis. L3-4: Minimal disc bulging and mild facet hypertrophy without stenosis. L4-5: Disc bulging greater to the right and moderate facet hypertrophy result in mild right neural foraminal stenosis without spinal stenosis. Left larger than right facet joint effusions. L5-S1: Mild facet arthrosis without stenosis. 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 Electronically Signed   By: Logan Bores M.D.   On: 06/16/2018 17:00   Mr Foot Right Wo Contrast  Result Date: 06/18/2018 CLINICAL DATA:  Right forefoot pain and swelling in a diabetic patient for 6 weeks. No known injury. EXAM: MRI OF THE RIGHT FOREFOOT WITHOUT CONTRAST TECHNIQUE: Multiplanar, multisequence MR imaging of the right  forefoot was performed. No intravenous contrast was administered. COMPARISON:  Plain films right foot 06/16/2018 FINDINGS: Bones/Joint/Cartilage No acute bony or joint abnormality is identified. There is no bone marrow signal change to suggest osteomyelitis. Severe degenerative disease about the tarsometatarsal joints is worst at the first TMT joint. Ligaments Intact. Muscles and Tendons Intact. No intramuscular fluid collection. Fatty atrophy of intrinsic musculature the foot is noted. Soft tissues Small volume of fluid subjacent to the first tarsometatarsal joint measures 1 cm transverse by 1.2 cm craniocaudal by 1.1 cm long and likely represents an adventitial bursa. IMPRESSION: Negative for acute bony or joint abnormality. Advanced osteoarthritis about the tarsometatarsal joints appears worst at the first TMT joint. Small volume of fluid in the subcutaneous tissues deep to the first TMT joint likely represents adventitial bursa formation. Abscess is possible but thought less likely. Electronically Signed   By: Inge Rise M.D.   On: 06/18/2018 10:24   US Arterial Abi (screening Lower Extremity)  Result Date: 06/17/2018 CLINICAL DATA:  Diabetes, right medial diabetic wound ulcer x4 days. Hypertension, hyperlipidemia. EXAM: NONINVASIVE PHYSIOLOGIC VASCULAR STUDY OF BILATERAL LOWER EXTREMITIES TECHNIQUE: Evaluation of both lower extremities were performed at rest, including calculation of ankle-brachial indices with single level Doppler, pressure and pulse volume recording. COMPARISON:  None. FINDINGS: Right ABI:  1.06 Left ABI:  1.0 Right Lower Extremity:  Normal arterial waveforms at the ankle. Left Lower Extremity:  Normal arterial waveforms at the ankle. IMPRESSION: No evidence of hemodynamically significant lower extremity arterial occlusive disease at rest. Electronically Signed   By: Lucrezia Europe M.D.   On: 06/17/2018 10:16   Dg Chest Port 1 View  Result Date: 06/21/2018 CLINICAL DATA:  Post PICC  placement. EXAM: PORTABLE CHEST 1 VIEW COMPARISON:  06/20/2018 and older exams. FINDINGS: New right-sided PICC tip projects in the lower superior vena cava near the caval atrial junction. There is opacity the left lung base, that appears increased from the previous day's study although the difference may be due to differences in patient positioning only. Mild opacity along minor fissure and at the right lung base is stable, likely atelectasis with small effusion. Suspect a left pleural effusion also with atelectasis versus pneumonia. Remainder of the lungs is clear.  No pneumothorax. IMPRESSION: 1. Right PICC tip projects in the lower superior vena cava near the caval atrial junction, well positioned. 2. Possible increase in lung base opacity on the left. Suspect a combination of pleural fluid with either atelectasis or pneumonia. 3. No convincing residual pulmonary edema. Electronically Signed   By: Lajean Manes M.D.   On: 06/21/2018 19:56   Dg Chest Port 1  View  Result Date: 06/20/2018 CLINICAL DATA:  Shortness of breath. EXAM: PORTABLE CHEST 1 VIEW COMPARISON:  Chest x-ray 06/07/2018. FINDINGS: Patient is rotated to the right. Mediastinal widening noted., this is most likely vascular related to AP portable technique. Cardiomegaly with bilateral interstitial prominence and small bilateral pleural effusions. Findings suggest CHF. Bilateral mid lung field subsegmental atelectasis. No pneumothorax. IMPRESSION: Findings consistent with congestive heart failure bilateral interstitial edema bilateral pleural effusions. Pneumonitis can not be excluded. Electronically Signed   By: Marcello Moores  Register   On: 06/20/2018 11:37   Dg Foot Complete Right  Result Date: 06/16/2018 CLINICAL DATA:  68 year old female with foot ulcer EXAM: RIGHT FOOT COMPLETE - 3+ VIEW COMPARISON:  None. FINDINGS: Circumferential soft tissue swelling of the forefoot. No radiopaque foreign body. No lytic changes visualized. Degenerative changes  of the interphalangeal joints. Neuropathic midfoot changes including sclerotic changes, disruption of the Lisfranc joint/disorganization, loss of normal joint spaces of the cuneiform is a and cuboid as well as the cuboid navicular joint. Degenerative changes of the hindfoot. IMPRESSION: Circumferential soft tissue swelling of the forefoot, should be amenable to direct inspection. Neuropathic changes of the midfoot, including disruption of the Lisfranc joint. Electronically Signed   By: Corrie Mckusick D.O.   On: 06/16/2018 19:44   Ct Renal Stone Study  Result Date: 06/16/2018 CLINICAL DATA:  Low back pain radiating into the stomach.  Nausea. EXAM: CT ABDOMEN AND PELVIS WITHOUT CONTRAST TECHNIQUE: Multidetector CT imaging of the abdomen and pelvis was performed following the standard protocol without IV contrast. COMPARISON:  None. FINDINGS: Lower chest: Heart size is mildly enlarged. Calcific coronary artery calcifications are identified. Mild dependent atelectasis noted. No pleural or pericardial effusion. Hepatobiliary: Multiple small stones are seen layering dependently within the gallbladder. There is no inflammatory change about the gallbladder. The liver and biliary tree appear normal. Pancreas: Unremarkable. No pancreatic ductal dilatation or surrounding inflammatory changes. The pancreas is atrophic. Spleen: Normal in size without focal abnormality. Adrenals/Urinary Tract: Small, benign left adrenal adenoma is noted. Right adrenal gland appears normal. The kidneys, ureters and urinary bladder are unremarkable. No hydronephrosis or stones. Stomach/Bowel: Stomach is within normal limits. Appendix appears normal. No evidence of bowel wall thickening, distention, or inflammatory changes. Vascular/Lymphatic: Aortic atherosclerosis. No enlarged abdominal or pelvic lymph nodes. Reproductive: Status post hysterectomy. No adnexal masses. Other: Trace amount of presacral fluid is noted. Musculoskeletal: There is  endplate destructive change at L1-2, much worse in the superior endplate of L2. Surrounding paraspinous inflammatory change is worse on the right. Imaged bones otherwise appear normal. IMPRESSION: Findings highly suspicious for discitis and osteomyelitis at L1-2 with associated paraspinous inflammatory change. MRI of the lumbar spine with and without contrast is recommended for further evaluation. Cardiomegaly. Gallstones without evidence of cholecystitis. Calcific aortic and coronary atherosclerosis. These results were called by telephone at the time of interpretation on 06/16/2018 at 3:39 pm to Dr. Nanda Quinton , who verbally acknowledged these results. Electronically Signed   By: Inge Rise M.D.   On: 06/16/2018 15:40   [place] Ekg Site Rite  Result Date: 06/21/2018 If Site Rite image not attached, placement could not be confirmed due to current cardiac rhythm.  Ir Lumbar Disc Aspiration W/img Guide  Result Date: 06/18/2018 INDICATION: Diabetes, bacteremia, low back pain, suspected discitis L1-2 on recent MR. EXAM: LUMBAR L1-2 DISC ASPIRATION UNDER FLUOROSCOPY MEDICATIONS: Lidocaine 1% subcutaneous ANESTHESIA/SEDATION: Intravenous Fentanyl and Versed were administered as conscious sedation during continuous monitoring of the patient's level of consciousness and physiological /  cardiorespiratory status by the radiology RN, with a total moderate sedation time of 15 minutes. PROCEDURE: Informed written consent was obtained from the patient after a thorough discussion of the procedural risks, benefits and alternatives. All questions were addressed. Maximal Sterile Barrier Technique was utilized including caps, mask, sterile gowns, sterile gloves, sterile drape, hand hygiene and skin antiseptic. A timeout was performed prior to the initiation of the procedure. Patient placed prone. The appropriate interspace was identified under fluoroscopy, corresponding to previous cross-sectional imaging. An appropriate skin  entry site was determined. After local infiltration with 1% lidocaine, a 17 gauge trocar needle was advanced into the lumbar L1-2 interspace from left posterolateral extraforaminal approach. Needle tip position within the interspace confirmed on biplane images. Approximately 2 mL of thin purulent-appearing material were aspirated, sent for the requested laboratory studies. Towards the end of the case, accurate O2 sats were not being displayed. Bradycardia was identified. The patient was urgently turned supine onto the stretcher. Apnea was noted. Resuscitation efforts were initiated, including Narcan, with return of spontaneous breathing, good circulation, patient able to follow commands, pain-free. Patient was transferred to the ICU for observation. FLUOROSCOPY TIME:  54 seconds; 57 mGy COMPLICATIONS: Transient apnea and bradycardia, presumably sedation related. SIR level B: Nominal therapy (including overnight admission for observation), no consequence. IMPRESSION: Technically successful lumbar L1-2 disc aspiration under fluoroscopy. Electronically Signed   By: Lucrezia Europe M.D.   On: 06/18/2018 16:03       Subjective: Patient seen and examined the bedside this morning.  Remains comfortable.  Hemodynamically stable for discharge today.  Discharge Exam: Vitals:   06/26/18 0423 06/26/18 0800  BP: 138/68 (!) 141/82  Pulse: (!) 102 95  Resp: 18 19  Temp: 98.4 F (36.9 C) 98.2 F (36.8 C)  SpO2: 93% 96%   Vitals:   06/25/18 1220 06/25/18 2018 06/26/18 0423 06/26/18 0800  BP: 126/75 (!) 132/55 138/68 (!) 141/82  Pulse: 80 (!) 103 (!) 102 95  Resp: _0 Temp: 99.2 F (37.3 C) 97.8 F (36.6 C) 98.4 F (36.9 C) 98.2 F (36.8 C)  TempSrc: Oral Oral Oral Oral  SpO2: 98% 95% 93% 96%  Weight:   74.4 kg   Height:        General: Pt is alert, awake, not in acute distress Cardiovascular: RRR, S1/S2 +, no rubs, no gallops Respiratory: CTA bilaterally, no wheezing, no rhonchi Abdominal:  Soft, NT, ND, bowel sounds + Extremities: no edema, no cyanosis    The results of significant diagnostics from this hospitalization (including imaging, microbiology, ancillary and laboratory) are listed below for reference.     Microbiology: Recent Results (from the past 240 hour(s))  Urine culture     Status: Abnormal   Collection Time: 06/16/18 12:57 PM  Result Value Ref Range Status   Specimen Description   Final    URINE, CLEAN CATCH Performed at [facility], [address] [place], [place] [address]    Special Requests   Final    NONE Performed at [facility], [address] [place], [place] [address]    Culture (A)  Final    >=100,000 COLONIES/mL STAPHYLOCOCCUS AUREUS >=100,000 COLONIES/mL GRANULICATELLA ADIACENS Standardized susceptibility testing for this organism is not available. Performed at [facility], [place] [address], [place], [place] [address]    Report Status 06/20/2018 FINAL  Final   Organism ID, Bacteria STAPHYLOCOCCUS AUREUS (A)  Final      Susceptibility   Staphylococcus aureus - MIC*  CIPROFLOXACIN <=0.5 SENSITIVE Sensitive     GENTAMICIN <=0.5 SENSITIVE Sensitive     NITROFURANTOIN <=16 SENSITIVE Sensitive     OXACILLIN <=0.25 SENSITIVE Sensitive     TETRACYCLINE <=1 SENSITIVE Sensitive     VANCOMYCIN <=0.5 SENSITIVE Sensitive     TRIMETH/SULFA <=10 SENSITIVE Sensitive     CLINDAMYCIN <=0.25 SENSITIVE Sensitive     RIFAMPIN <=0.5 SENSITIVE Sensitive     Inducible Clindamycin NEGATIVE Sensitive     * >=100,000 COLONIES/mL STAPHYLOCOCCUS AUREUS  Culture, blood (routine x 2)     Status: Abnormal   Collection Time: 06/16/18  1:53 PM  Result Value Ref Range Status   Specimen Description   Final    LEFT ANTECUBITAL Performed at [facility], [address], [place], [place] [address]    Special Requests   Final    BOTTLES DRAWN AEROBIC ONLY Blood Culture results may not be optimal due to an inadequate volume of blood received in culture  bottles Performed at [facility], [address], [place], [place] [address]    Culture  Setup Time   Final    AEROBIC BOTTLE ONLY GRAM POSITIVE COCCI Gram Stain Report Called to,Read Back By and Verified With: J HEARN,RN _0  06/17/18 [facility] Performed at [facility], [address] [place], [place] [address]    Culture (A)  Final    STAPHYLOCOCCUS AUREUS SUSCEPTIBILITIES PERFORMED ON PREVIOUS CULTURE WITHIN THE LAST 5 DAYS. Performed at [facility], [place] [address] [place], [place] [address]    Report Status 06/19/2018 FINAL  Final  Culture, blood (routine x 2)     Status: Abnormal   Collection Time: 06/16/18  2:51 PM  Result Value Ref Range Status   Specimen Description   Final    BLOOD BLOOD RIGHT WRIST Performed at [facility], [address], [place], [place] [address]    Special Requests   Final    BOTTLES DRAWN AEROBIC AND ANAEROBIC Blood Culture results may not be optimal due to an inadequate volume of blood received in culture bottles Performed at [facility] [employer], [place] [address]    Culture  Setup Time   Final    IN BOTH AEROBIC AND ANAEROBIC BOTTLES GRAM POSITIVE COCCI Gram Stain Report Called to,Read Back By and Verified With: J HEARN,RN _1  06/17/18 MKELLY CRITICAL RESULT CALLED TO, READ BACK BY AND VERIFIED WITH: PHARMD L POOLE [id number] MLM Performed at [facility], [place] [address], [place],  [address]    Culture STAPHYLOCOCCUS AUREUS (A)  Final   Report Status 06/19/2018 FINAL  Final   Organism ID, Bacteria STAPHYLOCOCCUS AUREUS  Final      Susceptibility   Staphylococcus aureus - MIC*    CIPROFLOXACIN <=0.5 SENSITIVE Sensitive     ERYTHROMYCIN >=8 RESISTANT Resistant     GENTAMICIN <=0.5 SENSITIVE Sensitive     OXACILLIN <=0.25 SENSITIVE Sensitive     TETRACYCLINE <=1 SENSITIVE Sensitive     VANCOMYCIN <=0.5 SENSITIVE Sensitive     TRIMETH/SULFA <=10 SENSITIVE Sensitive     CLINDAMYCIN <=0.25 SENSITIVE  Sensitive     RIFAMPIN <=0.5 SENSITIVE Sensitive     Inducible Clindamycin NEGATIVE Sensitive     * STAPHYLOCOCCUS AUREUS  Blood Culture ID Panel (Reflexed)     Status: Abnormal   Collection Time: 06/16/18  2:51 PM  Result Value Ref Range Status   Enterococcus species NOT DETECTED NOT DETECTED Final   Listeria monocytogenes NOT DETECTED NOT DETECTED Final  Staphylococcus species DETECTED (A) NOT DETECTED Final    Comment: CRITICAL RESULT CALLED TO, READ BACK BY AND VERIFIED WITH: PHARMD L POOLE [phone] MLM    Staphylococcus aureus (BCID) DETECTED (A) NOT DETECTED Final    Comment: Methicillin (oxacillin) susceptible Staphylococcus aureus (MSSA). Preferred therapy is anti staphylococcal beta lactam antibiotic (Cefazolin or Nafcillin), unless clinically contraindicated. CRITICAL RESULT CALLED TO, READ BACK BY AND VERIFIED WITH: PHARMD L POOLE [id number] MLM    Methicillin resistance NOT DETECTED NOT DETECTED Final   Streptococcus species NOT DETECTED NOT DETECTED Final   Streptococcus agalactiae NOT DETECTED NOT DETECTED Final   Streptococcus pneumoniae NOT DETECTED NOT DETECTED Final   Streptococcus pyogenes NOT DETECTED NOT DETECTED Final   Acinetobacter baumannii NOT DETECTED NOT DETECTED Final   Enterobacteriaceae species NOT DETECTED NOT DETECTED Final   Enterobacter cloacae complex NOT DETECTED NOT DETECTED Final   Escherichia coli NOT DETECTED NOT DETECTED Final   Klebsiella oxytoca NOT DETECTED NOT DETECTED Final   Klebsiella pneumoniae NOT DETECTED NOT DETECTED Final   Proteus species NOT DETECTED NOT DETECTED Final   Serratia marcescens NOT DETECTED NOT DETECTED Final   Haemophilus influenzae NOT DETECTED NOT DETECTED Final   Neisseria meningitidis NOT DETECTED NOT DETECTED Final   Pseudomonas aeruginosa NOT DETECTED NOT DETECTED Final   Candida albicans NOT DETECTED NOT DETECTED Final   Candida glabrata NOT DETECTED NOT DETECTED Final   Candida krusei NOT DETECTED  NOT DETECTED Final   Candida parapsilosis NOT DETECTED NOT DETECTED Final   Candida tropicalis NOT DETECTED NOT DETECTED Final    Comment: Performed at [facility], [place] [place], [place] [address]  MRSA PCR Screening     Status: None   Collection Time: 06/17/18 12:13 PM  Result Value Ref Range Status   MRSA by PCR NEGATIVE NEGATIVE Final    Comment:        The GeneXpert MRSA Assay (FDA approved for NASAL specimens only), is one component of a comprehensive MRSA colonization surveillance program. It is not intended to diagnose MRSA infection nor to guide or monitor treatment for MRSA infections. Performed at [facility], [place] [address], [place], [place] [address]   Aerobic/Anaerobic Culture (surgical/deep wound)     Status: None   Collection Time: 06/18/18 12:02 PM  Result Value Ref Range Status   Specimen Description VERTEBRA DISC  Final   Special Requests NONE  Final   Gram Stain   Final    ABUNDANT WBC PRESENT, PREDOMINANTLY PMN ABUNDANT GRAM POSITIVE COCCI IN PAIRS IN CLUSTERS    Culture   Final    MODERATE STAPHYLOCOCCUS AUREUS NO ANAEROBES ISOLATED Performed at [facility], [address] [place], [place] [address]    Report Status 06/23/2018 FINAL  Final   Organism ID, Bacteria STAPHYLOCOCCUS AUREUS  Final      Susceptibility   Staphylococcus aureus - MIC*    CIPROFLOXACIN <=0.5 SENSITIVE Sensitive     ERYTHROMYCIN >=8 RESISTANT Resistant     GENTAMICIN <=0.5 SENSITIVE Sensitive     OXACILLIN <=0.25 SENSITIVE Sensitive     TETRACYCLINE <=1 SENSITIVE Sensitive     VANCOMYCIN <=0.5 SENSITIVE Sensitive     TRIMETH/SULFA <=10 SENSITIVE Sensitive     CLINDAMYCIN <=0.25 SENSITIVE Sensitive     RIFAMPIN <=0.5 SENSITIVE Sensitive     Inducible Clindamycin NEGATIVE Sensitive     * MODERATE STAPHYLOCOCCUS AUREUS  Culture, blood (routine x 2)     Status: None  Collection Time: 06/18/18  4:15 PM  Result Value Ref Range Status   Specimen  Description BLOOD LEFT ARM  Final   Special Requests   Final    BOTTLES DRAWN AEROBIC ONLY Blood Culture adequate volume   Culture   Final    NO GROWTH 5 DAYS Performed at [facility], [address] [place], [place] [address]    Report Status 06/23/2018 FINAL  Final  Culture, blood (routine x 2)     Status: None   Collection Time: 06/18/18  4:49 PM  Result Value Ref Range Status   Specimen Description BLOOD LEFT ANTECUBITAL  Final   Special Requests   Final    BOTTLES DRAWN AEROBIC AND ANAEROBIC Blood Culture adequate volume   Culture   Final    NO GROWTH 5 DAYS Performed at [facility], [place] [address] [place], [place] [address]    Report Status 06/23/2018 FINAL  Final  Aerobic Culture (superficial specimen)     Status: None   Collection Time: 06/19/18  1:04 PM  Result Value Ref Range Status   Specimen Description WOUND  Final   Special Requests PERINEAL  Final   Gram Stain   Final    NO WBC SEEN FEW YEAST Performed at [facility], [place] [address], [place], [place] [address]    Culture MODERATE CANDIDA ALBICANS  Final   Report Status 06/23/2018 FINAL  Final     Labs: BNP (last 3 results) No results for input(s): BNP in the last 8760 hours. Basic Metabolic Panel: Recent Labs  Lab 06/20/18 0557  06/22/18 0600 06/23/18 0327 06/24/18 0500 06/25/18 0326 06/26/18 0323  NA 129*   < > 129* 130* 129* 130* 131*  K 3.4*   < > 3.6 3.5 3.4* 3.8 3.8  CL 94*   < > 91* 90* 88* 89* 89*  CO2 26   < > _0 GLUCOSE 176*   < > 239* 159* 169* 189* 143*  BUN 10   < > _1 CREATININE 0.51   < > 0.70 0.62 0.69 0.60 0.60  CALCIUM 8.2*   < > 7.9* 7.9* 8.0* 7.9* 8.2*  MG 1.6*  --  1.4* 2.0  --   --   --   PHOS 2.6  --   --   --   --   --   --    < > = values in this interval not displayed.   Liver Function Tests: No results for input(s): AST, ALT, ALKPHOS, BILITOT, PROT, ALBUMIN in the last 168 hours. No results for input(s): LIPASE,  AMYLASE in the last 168 hours. No results for input(s): AMMONIA in the last 168 hours. CBC: Recent Labs  Lab 06/20/18 0557  WBC 10.8*  HGB 9.9*  HCT 29.5*  MCV 97.7  PLT 394   Cardiac Enzymes: No results for input(s): CKTOTAL, CKMB, CKMBINDEX, TROPONINI in the last 168 hours. BNP: Invalid input(s): POCBNP CBG: Recent Labs  Lab 06/25/18 0821 06/25/18 1217 06/25/18 1705 06/25/18 2126 06/26/18 0820  GLUCAP 215* 178* 239* 137* 123*   D-Dimer No results for input(s): DDIMER in the last 72 hours. Hgb A1c No results for input(s): HGBA1C in the last 72 hours. Lipid Profile No results for input(s): CHOL, HDL, LDLCALC, TRIG, CHOLHDL, LDLDIRECT in the last 72 hours. Thyroid function studies No results for input(s): TSH, T4TOTAL, T3FREE, THYROIDAB in the last 72 hours.  Invalid input(s):  FREET3 Anemia work up No results for input(s): VITAMINB12, FOLATE, FERRITIN, TIBC, IRON, RETICCTPCT in the last 72 hours. Urinalysis    Component Value Date/Time   COLORURINE YELLOW 06/16/2018 1257   APPEARANCEUR HAZY (A) 06/16/2018 1257   LABSPEC 1.022 06/16/2018 1257   PHURINE 6.0 06/16/2018 1257   GLUCOSEU >=500 (A) 06/16/2018 1257   HGBUR SMALL (A) 06/16/2018 1257   BILIRUBINUR NEGATIVE 06/16/2018 1257   KETONESUR 80 (A) 06/16/2018 1257   PROTEINUR NEGATIVE 06/16/2018 1257   NITRITE POSITIVE (A) 06/16/2018 1257   LEUKOCYTESUR NEGATIVE 06/16/2018 1257   Sepsis Labs Invalid input(s): PROCALCITONIN,  WBC,  LACTICIDVEN Microbiology Recent Results (from the past 240 hour(s))  Urine culture     Status: Abnormal   Collection Time: 06/16/18 12:57 PM  Result Value Ref Range Status   Specimen Description   Final    URINE, CLEAN CATCH Performed at [facility], [address] [place], [place] [address]    Special Requests   Final    NONE Performed at [facility], [address] [place], [place] [address]    Culture (A)  Final    >=100,000 COLONIES/mL STAPHYLOCOCCUS AUREUS >=100,000  COLONIES/mL GRANULICATELLA ADIACENS Standardized susceptibility testing for this organism is not available. Performed at [facility], [place] [address] [place], [place] [address]    Report Status 06/20/2018 FINAL  Final   Organism ID, Bacteria STAPHYLOCOCCUS AUREUS (A)  Final      Susceptibility   Staphylococcus aureus - MIC*    CIPROFLOXACIN <=0.5 SENSITIVE Sensitive     GENTAMICIN <=0.5 SENSITIVE Sensitive     NITROFURANTOIN <=16 SENSITIVE Sensitive     OXACILLIN <=0.25 SENSITIVE Sensitive     TETRACYCLINE <=1 SENSITIVE Sensitive     VANCOMYCIN <=0.5 SENSITIVE Sensitive     TRIMETH/SULFA <=10 SENSITIVE Sensitive     CLINDAMYCIN <=0.25 SENSITIVE Sensitive     RIFAMPIN <=0.5 SENSITIVE Sensitive     Inducible Clindamycin NEGATIVE Sensitive     * >=100,000 COLONIES/mL STAPHYLOCOCCUS AUREUS  Culture, blood (routine x 2)     Status: Abnormal   Collection Time: 06/16/18  1:53 PM  Result Value Ref Range Status   Specimen Description   Final    LEFT ANTECUBITAL Performed at [facility], [address] [place], [place] [address]    Special Requests   Final    BOTTLES DRAWN AEROBIC ONLY Blood Culture results may not be optimal due to an inadequate volume of blood received in culture bottles Performed at [facility], [address], [place], [place] [address]    Culture  Setup Time   Final    AEROBIC BOTTLE ONLY GRAM POSITIVE COCCI Gram Stain Report Called to,Read Back By and Verified With: J HEARN,RN _0  06/17/18 [facility] Performed at [facility], [address], [place], [place] [address]    Culture (A)  Final    STAPHYLOCOCCUS AUREUS SUSCEPTIBILITIES PERFORMED ON PREVIOUS CULTURE WITHIN THE LAST 5 DAYS. Performed at [facility], [place] [address] [place], [place] [address]    Report Status 06/19/2018 FINAL  Final  Culture, blood (routine x 2)     Status: Abnormal   Collection Time: 06/16/18  2:51 PM  Result Value Ref Range Status   Specimen Description   Final     BLOOD BLOOD RIGHT WRIST Performed at [facility], [address] [place], [place] [address]    Special Requests   Final    BOTTLES DRAWN AEROBIC AND ANAEROBIC Blood Culture results may not  be optimal due to an inadequate volume of blood received in culture bottles Performed at [facility], [address] [place], [place] [address]    Culture  Setup Time   Final    IN BOTH AEROBIC AND ANAEROBIC BOTTLES GRAM POSITIVE COCCI Gram Stain Report Called to,Read Back By and Verified With: J HEARN,RN _0  06/17/18 MKELLY CRITICAL RESULT CALLED TO, READ BACK BY AND VERIFIED WITH: PHARMD L POOLE [id number] 0800 MLM Performed at [facility], [place] [address] [place], [place] [address]    Culture STAPHYLOCOCCUS AUREUS (A)  Final   Report Status 06/19/2018 FINAL  Final   Organism ID, Bacteria STAPHYLOCOCCUS AUREUS  Final      Susceptibility   Staphylococcus aureus - MIC*    CIPROFLOXACIN <=0.5 SENSITIVE Sensitive     ERYTHROMYCIN >=8 RESISTANT Resistant     GENTAMICIN <=0.5 SENSITIVE Sensitive     OXACILLIN <=0.25 SENSITIVE Sensitive     TETRACYCLINE <=1 SENSITIVE Sensitive     VANCOMYCIN <=0.5 SENSITIVE Sensitive     TRIMETH/SULFA <=10 SENSITIVE Sensitive     CLINDAMYCIN <=0.25 SENSITIVE Sensitive     RIFAMPIN <=0.5 SENSITIVE Sensitive     Inducible Clindamycin NEGATIVE Sensitive     * STAPHYLOCOCCUS AUREUS  Blood Culture ID Panel (Reflexed)     Status: Abnormal   Collection Time: 06/16/18  2:51 PM  Result Value Ref Range Status   Enterococcus species NOT DETECTED NOT DETECTED Final   Listeria monocytogenes NOT DETECTED NOT DETECTED Final   Staphylococcus species DETECTED (A) NOT DETECTED Final    Comment: CRITICAL RESULT CALLED TO, READ BACK BY AND VERIFIED WITH: PHARMD L POOLE [phone] MLM    Staphylococcus aureus (BCID) DETECTED (A) NOT DETECTED Final    Comment: Methicillin (oxacillin) susceptible Staphylococcus aureus (MSSA). Preferred therapy is anti staphylococcal beta lactam  antibiotic (Cefazolin or Nafcillin), unless clinically contraindicated. CRITICAL RESULT CALLED TO, READ BACK BY AND VERIFIED WITH: PHARMD L POOLE [id number] 0800 MLM    Methicillin resistance NOT DETECTED NOT DETECTED Final   Streptococcus species NOT DETECTED NOT DETECTED Final   Streptococcus agalactiae NOT DETECTED NOT DETECTED Final   Streptococcus pneumoniae NOT DETECTED NOT DETECTED Final   Streptococcus pyogenes NOT DETECTED NOT DETECTED Final   Acinetobacter baumannii NOT DETECTED NOT DETECTED Final   Enterobacteriaceae species NOT DETECTED NOT DETECTED Final   Enterobacter cloacae complex NOT DETECTED NOT DETECTED Final   Escherichia coli NOT DETECTED NOT DETECTED Final   Klebsiella oxytoca NOT DETECTED NOT DETECTED Final   Klebsiella pneumoniae NOT DETECTED NOT DETECTED Final   Proteus species NOT DETECTED NOT DETECTED Final   Serratia marcescens NOT DETECTED NOT DETECTED Final   Haemophilus influenzae NOT DETECTED NOT DETECTED Final   Neisseria meningitidis NOT DETECTED NOT DETECTED Final   Pseudomonas aeruginosa NOT DETECTED NOT DETECTED Final   Candida albicans NOT DETECTED NOT DETECTED Final   Candida glabrata NOT DETECTED NOT DETECTED Final   Candida krusei NOT DETECTED NOT DETECTED Final   Candida parapsilosis NOT DETECTED NOT DETECTED Final   Candida tropicalis NOT DETECTED NOT DETECTED Final    Comment: Performed at [facility], [place], [place], [place] [address]  MRSA PCR Screening     Status: None   Collection Time: 06/17/18 12:13 PM  Result Value Ref Range Status   MRSA by PCR NEGATIVE NEGATIVE Final    Comment:        The GeneXpert MRSA Assay (FDA approved for NASAL specimens only), is  one component of a comprehensive MRSA colonization surveillance program. It is not intended to diagnose MRSA infection nor to guide or monitor treatment for MRSA infections. Performed at [facility], [place] [address] [place], [place] [address]    Aerobic/Anaerobic Culture (surgical/deep wound)     Status: None   Collection Time: 06/18/18 12:02 PM  Result Value Ref Range Status   Specimen Description VERTEBRA DISC  Final   Special Requests NONE  Final   Gram Stain   Final    ABUNDANT WBC PRESENT, PREDOMINANTLY PMN ABUNDANT GRAM POSITIVE COCCI IN PAIRS IN CLUSTERS    Culture   Final    MODERATE STAPHYLOCOCCUS AUREUS NO ANAEROBES ISOLATED Performed at [facility], [address], [place], [place] [address]    Report Status 06/23/2018 FINAL  Final   Organism ID, Bacteria STAPHYLOCOCCUS AUREUS  Final      Susceptibility   Staphylococcus aureus - MIC*    CIPROFLOXACIN <=0.5 SENSITIVE Sensitive     ERYTHROMYCIN >=8 RESISTANT Resistant     GENTAMICIN <=0.5 SENSITIVE Sensitive     OXACILLIN <=0.25 SENSITIVE Sensitive     TETRACYCLINE <=1 SENSITIVE Sensitive     VANCOMYCIN <=0.5 SENSITIVE Sensitive     TRIMETH/SULFA <=10 SENSITIVE Sensitive     CLINDAMYCIN <=0.25 SENSITIVE Sensitive     RIFAMPIN <=0.5 SENSITIVE Sensitive     Inducible Clindamycin NEGATIVE Sensitive     * MODERATE STAPHYLOCOCCUS AUREUS  Culture, blood (routine x 2)     Status: None   Collection Time: 06/18/18  4:15 PM  Result Value Ref Range Status   Specimen Description BLOOD LEFT ARM  Final   Special Requests   Final    BOTTLES DRAWN AEROBIC ONLY Blood Culture adequate volume   Culture   Final    NO GROWTH 5 DAYS Performed at [facility], [address], [place], [place] [address]    Report Status 06/23/2018 FINAL  Final  Culture, blood (routine x 2)     Status: None   Collection Time: 06/18/18  4:49 PM  Result Value Ref Range Status   Specimen Description BLOOD LEFT ANTECUBITAL  Final   Special Requests   Final    BOTTLES DRAWN AEROBIC AND ANAEROBIC Blood Culture adequate volume   Culture   Final    NO GROWTH 5 DAYS Performed at [facility], [place] [address], [place], [place] [address]    Report Status 06/23/2018 FINAL  Final   Aerobic Culture (superficial specimen)     Status: None   Collection Time: 06/19/18  1:04 PM  Result Value Ref Range Status   Specimen Description WOUND  Final   Special Requests PERINEAL  Final   Gram Stain   Final    NO WBC SEEN FEW YEAST Performed at [facility], [place] [address], [place], [place] [address]    Culture MODERATE CANDIDA ALBICANS  Final   Report Status 06/23/2018 FINAL  Final    Please note: You were cared for by a hospitalist during your hospital stay. Once you are discharged, your primary care physician will handle any further medical issues. Please note that NO REFILLS for any discharge medications will be authorized once you are discharged, as it is imperative that you return to your primary care physician (or establish a relationship with a primary care physician if you do not have one) for your post hospital discharge needs so that they can reassess your need for medications and  monitor your lab values.    Time coordinating discharge: 40 minutes  SIGNED:   Shelly Coss, MD  [facility] 06/26/2018, 10:46 AM Pager [phone]  If 7PM-7AM, please contact night-coverage [email or web address] Password [email or web address]

## 2018-06-26 NOTE — Progress Notes (Signed)
Patient discharged to inpatient rehabilitation, discharge instruction given to patient/family Report given to the nurse.

## 2018-06-26 NOTE — Progress Notes (Signed)
Pt admitted to [facility] room 07, oriented to unit routine and Rehab protocol. Pt is in pain, but no distress, husband at bedside, call bell in reach, pt aware to call for help with any movement outside of the bed.  Bed alarm is activated.

## 2018-06-27 ENCOUNTER — Inpatient Hospital Stay (HOSPITAL_COMMUNITY): Payer: Medicare HMO

## 2018-06-27 ENCOUNTER — Inpatient Hospital Stay (HOSPITAL_COMMUNITY): Payer: Medicare HMO | Admitting: Occupational Therapy

## 2018-06-27 ENCOUNTER — Inpatient Hospital Stay (HOSPITAL_COMMUNITY): Payer: Medicare HMO | Admitting: Physical Therapy

## 2018-06-27 DIAGNOSIS — I10 Essential (primary) hypertension: Secondary | ICD-10-CM

## 2018-06-27 DIAGNOSIS — M7989 Other specified soft tissue disorders: Secondary | ICD-10-CM

## 2018-06-27 DIAGNOSIS — M4646 Discitis, unspecified, lumbar region: Principal | ICD-10-CM

## 2018-06-27 DIAGNOSIS — E785 Hyperlipidemia, unspecified: Secondary | ICD-10-CM

## 2018-06-27 DIAGNOSIS — E08621 Diabetes mellitus due to underlying condition with foot ulcer: Secondary | ICD-10-CM

## 2018-06-27 DIAGNOSIS — L97401 Non-pressure chronic ulcer of unspecified heel and midfoot limited to breakdown of skin: Secondary | ICD-10-CM

## 2018-06-27 DIAGNOSIS — E1169 Type 2 diabetes mellitus with other specified complication: Secondary | ICD-10-CM

## 2018-06-27 LAB — COMPREHENSIVE METABOLIC PANEL
ALK PHOS: 103 U/L (ref 38–126)
ALT: <5 U/L (ref 0–44)
AST: 12 U/L — ABNORMAL LOW (ref 15–41)
Albumin: 1.8 g/dL — ABNORMAL LOW (ref 3.5–5.0)
Anion gap: 8 (ref 5–15)
BUN: 16 mg/dL (ref 8–23)
CO2: 32 mmol/L (ref 22–32)
Calcium: 8 mg/dL — ABNORMAL LOW (ref 8.9–10.3)
Chloride: 92 mmol/L — ABNORMAL LOW (ref 98–111)
Creatinine, Ser: 0.65 mg/dL (ref 0.44–1.00)
GFR calc Af Amer: >60 mL/min (ref >60)
GFR calc non Af Amer: >60 mL/min (ref >60)
Glucose, Bld: 95 mg/dL (ref 70–99)
Potassium: 4.1 mmol/L (ref 3.5–5.1)
Sodium: 132 mmol/L — ABNORMAL LOW (ref 135–145)
Total Bilirubin: 0.3 mg/dL (ref 0.3–1.2)
Total Protein: 6.1 g/dL — ABNORMAL LOW (ref 6.5–8.1)

## 2018-06-27 LAB — CBC WITH DIFFERENTIAL/PLATELET
Abs Immature Granulocytes: 0.06 10*3/uL (ref 0.00–0.07)
Basophils Absolute: 0 10*3/uL (ref 0.0–0.1)
Basophils Relative: 1 %
Eosinophils Absolute: 0.1 10*3/uL (ref 0.0–0.5)
Eosinophils Relative: 2 %
HCT: 25.1 % — ABNORMAL LOW (ref 36.0–46.0)
Hemoglobin: 7.9 g/dL — ABNORMAL LOW (ref 12.0–15.0)
Immature Granulocytes: 1 %
Lymphocytes Relative: 21 %
Lymphs Abs: 1.4 10*3/uL (ref 0.7–4.0)
MCH: 32.1 pg (ref 26.0–34.0)
MCHC: 31.5 g/dL (ref 30.0–36.0)
MCV: 102 fL — ABNORMAL HIGH (ref 80.0–100.0)
Monocytes Absolute: 0.5 10*3/uL (ref 0.1–1.0)
Monocytes Relative: 8 %
Neutro Abs: 4.5 10*3/uL (ref 1.7–7.7)
Neutrophils Relative %: 67 %
Platelets: 320 10*3/uL (ref 150–400)
RBC: 2.46 MIL/uL — ABNORMAL LOW (ref 3.87–5.11)
RDW: 19.1 % — ABNORMAL HIGH (ref 11.5–15.5)
WBC: 6.6 10*3/uL (ref 4.0–10.5)
nRBC: 0 % (ref 0.0–0.2)

## 2018-06-27 LAB — GLUCOSE, CAPILLARY
GLUCOSE-CAPILLARY: 194 mg/dL — AB (ref 70–99)
GLUCOSE-CAPILLARY: 93 mg/dL (ref 70–99)
Glucose-Capillary: 85 mg/dL (ref 70–99)
Glucose-Capillary: 143 mg/dL — ABNORMAL HIGH (ref 70–99)

## 2018-06-27 MED ORDER — BISACODYL 10 MG RE SUPP
10.0000 mg | Freq: Every day | RECTAL | Status: DC | PRN
  Administered 2018-06-27 – 2018-07-06 (×2): 10 mg via RECTAL
  Filled 2018-06-27 (×2): qty 1

## 2018-06-27 MED ORDER — FLEET ENEMA 7-19 GM/118ML RE ENEM
1.0000 | ENEMA | Freq: Every day | RECTAL | Status: DC | PRN
  Administered 2018-06-27: 1 via RECTAL
  Filled 2018-06-27: qty 1

## 2018-06-27 NOTE — Care Management Note (Signed)
Inpatient Rehabilitation Center Individual Statement of Services  Patient Name:  Kaitlin Vargas  Date:  06/27/2018  Welcome to the [facility].  Our goal is to provide you with an individualized program based on your diagnosis and situation, designed to meet your specific needs.  With this comprehensive rehabilitation program, you will be expected to participate in at least 3 hours of rehabilitation therapies Monday-Friday, with modified therapy programming on the weekends.  Your rehabilitation program will include the following services:  Physical Therapy (PT), Occupational Therapy (OT), 24 hour per day rehabilitation nursing, Case Management (Social Worker), Rehabilitation Medicine, Nutrition Services and Pharmacy Services  Weekly team conferences will be held on Tuesday to discuss your progress.  Your Social Worker will talk with you frequently to get your input and to update you on team discussions.  Team conferences with you and your family in attendance may also be held.  Expected length of stay: 14-18 Days  Overall anticipated outcome: supervision with cueing  Depending on your progress and recovery, your program may change. Your Social Worker will coordinate services and will keep you informed of any changes. Your Social Worker's name and contact numbers are listed  below.  The following services may also be recommended but are not provided by the [facility] [place]:    [place] will be made to provide these services after discharge if needed.  Arrangements include referral to agencies that provide these services.  Your insurance has been verified to be:  [employer] Your primary doctor is:  [name]  Pertinent information will be shared with your doctor and your insurance company.  Social Worker:  Lennart Pall, [place] or [phone]   Information  discussed with and copy given to patient by: Elease Hashimoto, 06/27/2018, 11:25 AM

## 2018-06-27 NOTE — Progress Notes (Signed)
Bilateral lower extremity venous duplex completed, Preliminary results in Chart review CV Proc. [name],RVS 06/27/2018, 3:19 PM

## 2018-06-27 NOTE — Consult Note (Signed)
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  MRI yesterday is negative for acute bony or joint abnormalities, but small volume of fluid in the subcutaneous tissue deep to the first transmetatarsal joint. Patient states she has seen a podiatrist in the distant past for this problem. Cannot recall name. Wound type: Full thickness, suspected trauma  Pressure Injury POA: N/A Measurement: 2.4cm x 1.4cm with no depth appreciated due to intact macerated callous (brown) Wound bed: As described above Drainage (amount, consistency, odor) brown/red exudate on old dressing (foam), no active exudate Periwound: Macerated to 1cm circumferentially Dressing procedure/placement/frequency: Conservative POC is implemented today consisting of cleansing with soap and water followed by dressing with an antimicrobial and astringent dressing (xeroform). Will discontinue foam over this wound in favor of dressing with dry gauze and medipore tape.  [place] nursing team will not follow, but will remain available to this patient, the nursing and medical teams.  Please re-consult if needed. Thanks, Maudie Flakes, MSN, RN, [place], Ar[facility] Abbott  Pager# [phone]

## 2018-06-27 NOTE — Progress Notes (Signed)
Social Work  Social Work Assessment and Plan  Patient Details  Name: Kaitlin Vargas MRN: [id number] Date of Birth: 27-Jul-1950  Today's Date: 06/27/2018  Problem List:  Patient Active Problem List   Diagnosis Date Noted  . Lumbar discitis 06/26/2018  . Cardiac arrest, cause unspecified ([place])   . Acute encephalopathy   . MSSA bacteremia 06/17/2018  . 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 DKA (diabetic ketoacidosis) ([place]) 06/16/2018  . Diabetic foot ulcer ([place]) 06/16/2018  . Acute hypoxemic respiratory failure ([place]) 06/07/2018  . Essential hypertension 06/07/2018  . Type 2 diabetes mellitus with hyperlipidemia ([place]) 06/07/2018  . Anemia 06/07/2018  . Acute bronchitis 06/07/2018   Past Medical History:  Past Medical History:  Diagnosis Date  . Anemia   . Diabetes mellitus without complication ([place])   . Hyperlipidemia   . Hypertension   . Peripheral neuropathy    Past Surgical History:  Past Surgical History:  Procedure Laterality Date  . ABDOMINAL HYSTERECTOMY    . IR LUMBAR DISC ASPIRATION W/IMG GUIDE  06/18/2018   Social History:  reports that she has never smoked. She has never used smokeless tobacco. She reports that she does not drink alcohol or use drugs.  Family / Support Systems Marital Status: Married Patient Roles: Spouse, Parent Spouse/Significant Other: Kaitlin Vargas [phone]-home [phone] Children: Kaitlin Vargas-daughter lives wiht them and another local daughter with grandchildren Other Supports: Church members and freinds Anticipated Caregiver: Husband and Kaitlin Vargas Ability/Limitations of Caregiver: Husband is retired and Programmer, systems works third shift Caregiver Availability: 24/7 Family Dynamics: Close knit family both daughter's are involved and supportive. Pt has sister's who are supportive and come by to visit her. Between their family and friends they have good support.  Social History Preferred language: English Religion:  Cultural Background: No issues Education: High School Read:  Yes Write: Yes Employment Status: Retired Public relations account executive Issues: No issues Guardian/Conservator: None-according to MD pt is capable of making her own decisions while here   Abuse/Neglect Abuse/Neglect Assessment Can Be Completed: Yes Physical Abuse: Denies Verbal Abuse: Denies Sexual Abuse: Denies Exploitation of patient/patient's resources: Denies Self-Neglect: Denies  Emotional Status Pt's affect, behavior and adjustment status: Pt is motivated to do well and get back to her independent level. She realizes she will have pain but is hopeful this will get better each day. She does want to be pre-medicated prior to therapies, she will remind nurse of this. She is glad they finally found what was causing her pain. Recent Psychosocial Issues: other health issues was recently discharged from [facility] for bronchitis Psychiatric History: No history deferred depression screen due to coping appropriately with her back issues and IV antibiotics she will need until 3/25. Will monitor while here to see if would need to see neuro-psych while here Substance Abuse History: No issues  Patient / Family Perceptions, Expectations & Goals Pt/Family understanding of illness & functional limitations: Pt and husband can explain her back issues and infection. Both talk with the MD's and feel they have a good understanding of her treatment plan going forward. She is glad to be on the last leg of her journey before going home. Premorbid pt/family roles/activities: Wife, Mother, grandmother, retiree, church member, sister, etc Anticipated changes in roles/activities/participation: resume Pt/family expectations/goals: Pt states: " I hope to be in less pain and moving better, I will do my best while I am here."  Husband states: " She tries hard but I will help her and so will our daughter's if needed."  [employer]: None Premorbid  Home Care/DME Agencies: None Transportation  available at discharge: Family  Discharge Planning Living Arrangements: Spouse/significant other, Children Support Systems: Spouse/significant other, Children, Other relatives, Water engineer, Social worker community Type of Residence: Private residence Insurance Resources: Multimedia programmer (specify)([employer] Medicare) Financial Resources: [place] Referred: No Living Expenses: Own Money Management: Spouse, Patient Does the patient have any problems obtaining your medications?: No Home Management: Husband and daughter but pt does cook some when she feels up to it Patient/Family Preliminary Plans: Return home with husband and daughter assisting if needed. Prior to admission husband was having to help her due to her decline in function before MD's found the infection. Husband is in good health and able to assist. Social Work Anticipated Follow Up Needs: HH/OP  Clinical Impression Pleasant female who is willing to work hard and push through her pain to regain her independence. She is aware she will be on IV antibiotics until 3/25. She has never had home health but aware this will be arranged prior to her going home form rehab. Will await therapy evaluations and work on discharge needs. Her family is very involved and supportive.  Kaitlin Vargas 06/27/2018, 11:37 AM

## 2018-06-27 NOTE — Progress Notes (Signed)
Per nursing, patient was given "Data Collection Information Summary for Patients in Inpatient Rehabilitation Facilities with attached Privacy Act Statement Health Care Records" upon admission.    Patient information reviewed and entered into [facility] System by Becky Zarinah Oviatt, PPS coordinator. Information including medical coding, function ability, and quality indicators will be reviewed and updated through discharge.   

## 2018-06-27 NOTE — Progress Notes (Signed)
Pt came back from therapy without using the toilet all day, c/o being too tired to sit on toilet now.  Put pt on bedpan and pt had been incontinent in her brief, pt unable to void on bedpan, supp given for constipation and will scan pt after supp success.

## 2018-06-27 NOTE — Plan of Care (Signed)
Pt is total assist for bladder, incontinent in brief and I&O cath to empty bladder, no bowel movement, dependent for dressing change to foot. Problem: Consults Goal: RH GENERAL PATIENT EDUCATION Description See Patient Education module for education specifics. Pt will identify 2 goals to follow for her diabetes management.  Outcome: Progressing Goal: Skin Care Protocol Initiated - if Braden Score 18 or less Description Pt will maintain skin without breakdown to buttocks/peri area from pressure or MASD  Outcome: Progressing Goal: Nutrition Consult-if indicated Description Pt to have consult with diabetes educator if desires  Outcome: Progressing Goal: Diabetes Guidelines if Diabetic/Glucose > 140 Description If diabetic or lab glucose is > 140 mg/dl - Initiate Diabetes/Hyperglycemia Guidelines & Document Interventions    Outcome: Progressing   Problem: RH BOWEL ELIMINATION Goal: RH STG MANAGE BOWEL WITH ASSISTANCE Description STG Manage Bowel with min Assistance.  Outcome: Progressing Goal: RH STG MANAGE BOWEL W/MEDICATION W/ASSISTANCE Description STG Manage Bowel with Medication with min Assistance.  Outcome: Progressing   Problem: RH SKIN INTEGRITY Goal: RH STG SKIN FREE OF INFECTION/BREAKDOWN Description No breakdown due to pressure or MASD  Outcome: Progressing Goal: RH STG MAINTAIN SKIN INTEGRITY WITH ASSISTANCE Description STG Maintain Skin Integrity With min Assistance.  Outcome: Progressing   Problem: RH SAFETY Goal: RH STG DECREASED RISK OF FALL WITH ASSISTANCE Description STG Decreased Risk of Fall With min Assistance.  Outcome: Progressing   Problem: RH KNOWLEDGE DEFICIT GENERAL Goal: RH STG INCREASE KNOWLEDGE OF SELF CARE AFTER HOSPITALIZATION Description Pt will have dates of follow up appointments.  Outcome: Progressing

## 2018-06-27 NOTE — Discharge Instructions (Signed)
Inpatient Rehab Discharge Instructions  [facility] Discharge date and time: No discharge date for patient encounter.   Activities/Precautions/ Functional Status: Activity: activity as tolerated Diet: diabetic diet Wound Care: keep wound clean and dry Functional status:  ___ No restrictions     ___ Walk up steps independently ___ 24/7 supervision/assistance   ___ Walk up steps with assistance ___ Intermittent supervision/assistance  ___ Bathe/dress independently ___ Walk with walker     _x__ Bathe/dress with assistance ___ Walk Independently    ___ Shower independently ___ Walk with assistance    ___ Shower with assistance ___ No alcohol     ___ Return to work/school ________  Special Instructions: Continue intravenous Ancef through 08/13/2018 and stop   My questions have been answered and I understand these instructions. I will adhere to these goals and the provided educational materials after my discharge from the hospital.  Patient/Caregiver Signature _______________________________ Date __________  Clinician Signature _______________________________________ Date __________  Please bring this form and your medication list with you to all your follow-up doctor's appointments.

## 2018-06-27 NOTE — IPOC Note (Signed)
Overall Plan of Care [facility]) Patient Details Name: Kaitlin Vargas MRN: [id number] DOB: [birth year]  Admitting Diagnosis: Lumbar discitis  Hospital Problems: Principal Problem:   Lumbar discitis Active Problems:   Essential hypertension   Type 2 diabetes mellitus with hyperlipidemia (HCC)   Diabetic foot ulcer ([place])     Functional Problem List: Nursing Bladder, Bowel, Edema, Endurance, Medication Management, Pain, Skin Integrity  PT Balance, Endurance, Edema, Motor, Pain, Sensory  OT Balance, Endurance, Motor, Pain, Sensory, Safety  SLP    TR         Basic ADL's: OT Toileting, Dressing, Eating, Grooming, Bathing     Advanced  ADL's: OT       Transfers: PT Bed Mobility, Bed to Chair, Car, Patent attorney, Agricultural engineer: PT Emergency planning/management officer, Ambulation     Additional Impairments: OT None  SLP        TR      Anticipated Outcomes Item Anticipated Outcome  Self Feeding Independent  Swallowing      Basic self-care  Media planner Transfers Supervision  Bowel/Bladder  manage b/b with min A  Transfers  supervision  Locomotion  supervision  Communication     Cognition     Pain  maintain pain <2  Safety/Judgment  follow safety plan throughout pt stay   Therapy Plan: PT Intensity: Minimum of 1-2 x/day ,45 to 90 minutes PT Frequency: 5 out of 7 days PT Duration Estimated Length of Stay: 14-18 days OT Intensity: Minimum of 1-2 x/day, 45 to 90 minutes OT Duration/Estimated Length of Stay: 14-18 days      Team Interventions: Nursing Interventions Patient/Family Education, Pain Management, Discharge Planning, Medication Management, Bladder Management, Bowel Management, Skin Care/Wound Management, Psychosocial Support, Disease Management/Prevention  PT interventions Ambulation/gait training, Community reintegration, DME/adaptive equipment instruction, Neuromuscular re-education, Stair training, UE/LE Strength  taining/ROM, Wheelchair propulsion/positioning, Training and development officer, Discharge planning, Pain management, Therapeutic Activities, UE/LE Coordination activities, Functional mobility training, Patient/family education, Splinting/orthotics, Therapeutic Exercise  OT Interventions Training and development officer, Community reintegration, Discharge planning, Disease mangement/prevention, Engineer, drilling, Functional mobility training, Neuromuscular re-education, Pain management, Patient/family education, Psychosocial support, Self Care/advanced ADL retraining, Therapeutic Activities, Therapeutic Exercise, UE/LE Strength taining/ROM, UE/LE Coordination activities, Wheelchair propulsion/positioning  SLP Interventions    TR Interventions    SW/CM Interventions Discharge Planning, Psychosocial Support, Patient/Family Education   Barriers to Discharge MD  Medical stability  Nursing IV antibiotics, Medical stability, Incontinence, Wound Care pt admitted with A1C of 8.5, diabetes management, LE edema, endurance, obesity  PT      OT      SLP      SW       Team Discharge Planning: Destination: PT-Home ,OT- Home , SLP-  Projected Follow-up: PT-Home health PT, OT-  Home health OT, SLP-  Projected Equipment Needs: PT-To be determined, OT- To be determined, SLP-  Equipment Details: PT- , OT-Likely a tub transfer bench Patient/family involved in discharge planning: PT- Patient,  OT-Patient, SLP-   MD ELOS: 14-18 days Medical Rehab Prognosis:  Good Assessment: The patient has been admitted for CIR therapies with the diagnosis of lumbar discitis, right foot wound. The team will be addressing functional mobility, strength, stamina, balance, safety, adaptive techniques and equipment, self-care, bowel and bladder mgt, patient and caregiver education, pain mgt, wound care, NMR, ego support. Goals have been set at supervision for mobility and self-care tasks.    Meredith Staggers, MD,  Mellody Drown  See Team Conference Notes for weekly updates to the plan of care

## 2018-06-27 NOTE — Progress Notes (Signed)
[place] PHYSICAL MEDICINE & REHABILITATION PROGRESS NOTE   Subjective/Complaints: In bed trying to eat breakfast. Doesn't have much appetite. Ongoing mid-low back pain.   ROS: Patient denies fever, rash, sore throat, blurred vision, nausea, vomiting, diarrhea, cough, shortness of breath or chest pain,  headache, or mood change.    Objective:   No results found. Recent Labs    06/26/18 1851 06/27/18 0341  WBC 8.2 6.6  HGB 7.9* 7.9*  HCT 24.8* 25.1*  PLT 313 320   Recent Labs    06/26/18 0323 06/26/18 1851 06/27/18 0341  NA 131*  --  132*  K 3.8  --  4.1  CL 89*  --  92*  CO2 30  --  32  GLUCOSE 143*  --  95  BUN 15  --  16  CREATININE 0.60 0.58 0.65  CALCIUM 8.2*  --  8.0*    Intake/Output Summary (Last 24 hours) at 06/27/2018 0845 Last data filed at 06/27/2018 0530 Gross per 24 hour  Intake 320.21 ml  Output 1200 ml  Net -879.79 ml     Physical Exam: Vital Signs Blood pressure 120/67, pulse 88, temperature 98.1 F (36.7 C), temperature source Oral, resp. rate 16, height 4\' 10"  (1.473 m), weight 77.4 kg, SpO2 100 %.  Constitutional: No distress . Vital signs reviewed. HEENT: EOMI, oral membranes moist Neck: supple Cardiovascular: RRR without murmur. No JVD    Respiratory: CTA Bilaterally without wheezes or rales. Normal effort    GI: BS +, non-tender, non-distended  Neurological: Patient is alert. Fair insight. Oriented to place, person, month. UE 4/5. LE: limited by pain but 2/5 prox to 3+/5 ADF/PF. Decreased LT distally Skin:ring enhancing lesions on both legs (mature) Right foot ulceration is dressed with eschar, some necrotic tissue along 1st MT. Erythema at the distal left foot and toes scaly skin.   Assessment/Plan: 1. Functional deficits secondary to Lumbar discitis which require 3+ hours per day of interdisciplinary therapy in a comprehensive inpatient rehab setting.  Physiatrist is providing close team supervision and 24 hour management of  active medical problems listed below.  Physiatrist and rehab team continue to assess barriers to discharge/monitor patient progress toward functional and medical goals  Care Tool:  Bathing              Bathing assist       Upper Body Dressing/Undressing Upper body dressing   What is the patient wearing?: Hospital gown only    Upper body assist Assist Level: Minimal Assistance - Patient > 75%    Lower Body Dressing/Undressing Lower body dressing      What is the patient wearing?: Underwear/pull up     Lower body assist Assist for lower body dressing: Maximal Assistance - Patient 25 - 49%     Toileting Toileting    Toileting assist Assist for toileting: Maximal Assistance - Patient 25 - 49%     Transfers Chair/bed transfer  Transfers assist  Chair/bed transfer activity did not occur: N/A  Chair/bed transfer assist level: Maximal Assistance - Patient 25 - 49%     Locomotion Ambulation   Ambulation assist              Walk 10 feet activity   Assist           Walk 50 feet activity   Assist           Walk 150 feet activity   Assist  Walk 10 feet on uneven surface  activity   Assist           Wheelchair     Assist               Wheelchair 50 feet with 2 turns activity    Assist            Wheelchair 150 feet activity     Assist          Medical Problem List and Plan: 1.Decreased functional mobilitysecondary to L1-2 discitis/ osteomyelitis as well as right foot ulcerationcomplicated by cardiac arrest  -beginning therapies today 2. DVT Prophylaxis/Anticoagulation: Subcutaneous heparin.   vascular studies are pending 3. Pain Management:Hydrocodone,Robaxin as needed  -will schedule hydrocodone prior therapies 4. Mood:Provide emotional support 5. Neuropsych: This patientiscapable of making decisions on herown behalf. 6. Skin/Wound Care/right foot wound ulceration:  -WOC  RN in today, appreciate wound care recs  -apparent tinea lesions on legs, appear to be resolving---observe for now 7. Fluids/Electrolytes/Nutrition:discussed with pt re: importance of nutrition for wound healing  -I personally reviewed the patient's labs today.   8. MSSA bacteremia.IV Ancef until 08/13/2018. Follow-up per infectious disease  -recent MRI of foot negative for osteo  -observe leg lesions 9. Acute on chronic anemia.   -diet/fe+ supp  -Hgb 7.9 2/7, stable 10. Diabetes mellitus with peripheral neuropathy. Hemoglobin A1c 8.7. Lantus insulin 25 units daily.    -sugars controlled 2/7 11. Hypertension. Lisinopril 10 mg daily,Lasix 40 mg daily,Lopressor 25 mg twice a day.   -controlled 2/7 12. B12 deficiency.Continue vitamin B12     LOS: 1 days A FACE TO FACE EVALUATION WAS PERFORMED  Meredith Staggers 06/27/2018, 8:45 AM

## 2018-06-27 NOTE — Evaluation (Signed)
Physical Therapy Assessment and Plan  Patient Details  Name: Kaitlin Vargas MRN: [id number] Date of Birth: November 23, 1950  PT Diagnosis: Abnormal posture, Difficulty walking, Impaired sensation, Muscle weakness and Pain in back Rehab Potential: Good ELOS: 14-18 days   Today's Date: 06/27/2018 PT Individual Time: 3536-1443 PT Individual Time Calculation (min): 54 min    Problem List:  Patient Active Problem List   Diagnosis Date Noted  . Lumbar discitis 06/26/2018  . Cardiac arrest, cause unspecified ([place])   . Acute encephalopathy   . MSSA bacteremia 06/17/2018  . 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 DKA (diabetic ketoacidosis) ([place]) 06/16/2018  . Diabetic foot ulcer ([place]) 06/16/2018  . Acute hypoxemic respiratory failure ([place]) 06/07/2018  . Essential hypertension 06/07/2018  . Type 2 diabetes mellitus with hyperlipidemia ([place]) 06/07/2018  . Anemia 06/07/2018  . Acute bronchitis 06/07/2018    Past Medical History:  Past Medical History:  Diagnosis Date  . Anemia   . Diabetes mellitus without complication ([place])   . Hyperlipidemia   . Hypertension   . Peripheral neuropathy    Past Surgical History:  Past Surgical History:  Procedure Laterality Date  . ABDOMINAL HYSTERECTOMY    . IR LUMBAR DISC ASPIRATION W/IMG GUIDE  06/18/2018    Assessment & Plan Clinical Impression: Patient is a 68 y.o. year old female with recent admission to the hospital on 06/16/2018 with increasing back pain and a CT scan of the spine showed what appeared to be osteomyelitis. She was transferred to [facility] interventional radiology for lumbar puncture and during procedure noted increase active bleeding as well as cardiac arrest requiring CPR 90 seconds. She did not require intubation. He was felt cardiac arrest likely due to respiratory arrest following sedation. 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 Patient also with  right foot ulceration. MRI of the foot negative for acute bony or joint abnormality. Small volume of fluid in the subcutaneous tissue deep to the first TMT joint. Infectious disease consulted patient with GPCs growing from lumbar aspirate. Placed on antibiotic therapy. TEE negative for vegetation. Current plans to continue cefazolin 6-8 weeks with in date of 08/13/2018. Subcutaneous heparin for DVT prophylaxis. Hospital course ongoing pain management. Acute on chronic anemia latest hemoglobin 9.9. Bouts of hyponatremia 130 and monitored.   Patient transferred to CIR on 06/26/2018 .   Patient currently requires mod with mobility secondary to muscle weakness and decreased sitting balance, decreased standing balance, decreased postural control and decreased balance strategies.  Prior to hospitalization, patient was modified independent  with mobility and lived with Spouse, Daughter in a House home.  Home access is  Ramped entrance.  Patient will benefit from skilled PT intervention to maximize safe functional mobility, minimize fall risk and decrease caregiver burden for planned discharge home with 24 hour supervision.  Anticipate patient will benefit from follow up [place] at discharge.  PT - End of Session Endurance Deficit: Yes Endurance Deficit Description: Multiple rest breaks within BADL tasks PT Assessment Rehab Potential (ACUTE/IP ONLY): Good PT Patient demonstrates impairments in the following area(s): Balance;Endurance;Edema;Motor;Pain;Sensory PT Transfers Functional Problem(s): Bed Mobility;Bed to Chair;Car;Furniture PT Locomotion Functional Problem(s): Wheelchair Mobility;Ambulation PT Plan PT Intensity: Minimum of 1-2 x/day ,45 to 90 minutes PT Frequency: 5 out of 7 days PT Duration Estimated Length of Stay: 14-18 days PT Treatment/Interventions: Ambulation/gait training;Community reintegration;DME/adaptive equipment instruction;Neuromuscular re-education;Stair training;UE/LE Strength  taining/ROM;Wheelchair propulsion/positioning;Balance/vestibular training;Discharge planning;Pain management;Therapeutic Activities;UE/LE Coordination activities;Functional mobility training;Patient/family education;Splinting/orthotics;Therapeutic Exercise PT  Transfers Anticipated Outcome(s): supervision PT Locomotion Anticipated Outcome(s): supervision PT Recommendation Follow Up Recommendations: Home health PT Patient destination: Home Equipment Recommended: To be determined  Skilled Therapeutic Intervention Pt participated in skilled PT eval and was educated on PT POC and goals.  Pt performs sit <> Stand and stand pivot transfers with RW with mod A.  Gait x 5' with RW and mod A, limited by LE fatigue.  Seated balance with min A without UE support, requires UE support due to back pain.  Simulated car transfer to sedan height car with mod A. Pt very fatigued throughout session, requires frequent rest breaks and encouragement. Pt left in w/c with alarm set, needs at hand.  PT Evaluation Precautions/Restrictions Precautions Precautions: Fall;Back Precaution Comments: back precautions for comfort Restrictions Weight Bearing Restrictions: No Pain Pain Assessment Pain Scale: 0-10 Pain Score: 8  Faces Pain Scale: Hurts whole lot Pain Type: Acute pain Pain Location: Back Pain Orientation: Left;Right;Mid Pain Descriptors / Indicators: Aching;Shooting Pain Onset: On-going Patients Stated Pain Goal: 4 Pain Intervention(s): RN made aware;Repositioned Multiple Pain Sites: Yes Home Living/Prior Functioning Home Living Living Arrangements: Spouse/significant other;Children Available Help at Discharge: Family;Available 24 hours/day Type of Home: House Home Access: Ramped entrance Home Layout: One level Bathroom Shower/Tub: Chiropodist: Standard Bathroom Accessibility: Yes Additional Comments: Ambulated with cane and RW PTA  Lives With: Spouse;Daughter Prior  Function Level of Independence: Independent with basic ADLs;Independent with homemaking with ambulation  Able to Take Stairs?: No Driving: No Vocation: Retired  Associate Professor Overall Cognitive Status: No family/caregiver present to determine baseline cognitive functioning Arousal/Alertness: Awake/alert Orientation Level: Oriented X4 Memory: Appears intact Safety/Judgment: Appears intact Sensation Sensation Light Touch: Impaired Detail Peripheral sensation comments: peripheral neuropathy bilat feet Light Touch Impaired Details: Impaired RLE;Impaired LLE Proprioception: Impaired Detail Proprioception Impaired Details: Impaired RLE;Impaired LLE Coordination Gross Motor Movements are Fluid and Coordinated: Yes Motor  Motor Motor - Skilled Clinical Observations: generalized weakness  Mobility Bed Mobility Bed Mobility: Rolling Left;Left Sidelying to Sit Rolling Left: Moderate Assistance - Patient 50-74% Left Sidelying to Sit: Moderate Assistance - Patient 50-74% Transfers Transfers: Stand to Sit;Sit to Stand Sit to Stand: Moderate Assistance - Patient 50-74% Stand to Sit: Moderate Assistance - Patient 50-74% Transfer (Assistive device): Rolling walker Locomotion  Gait Ambulation: Yes Gait Assistance: Moderate Assistance - Patient 50-74% Gait Distance (Feet): 5 Feet Assistive device: Rolling walker Stairs / Additional Locomotion Stairs: No Wheelchair Mobility Wheelchair Mobility: No  Trunk/Postural Assessment  Cervical Assessment Cervical Assessment: (fwd head) Thoracic Assessment Thoracic Assessment: (mild kyphosis) Lumbar Assessment Lumbar Assessment: (posterior pelvic tilt) Postural Control Postural Control: Deficits on evaluation Righting Reactions: delayed  Balance Balance Balance Assessed: Yes Static Sitting Balance Static Sitting - Level of Assistance: 5: Stand by assistance Dynamic Sitting Balance Dynamic Sitting - Balance Support: During functional  activity Dynamic Sitting - Level of Assistance: 4: Min assist Static Standing Balance Static Standing - Balance Support: During functional activity;Bilateral upper extremity supported Static Standing - Level of Assistance: 3: Mod assist Dynamic Standing Balance Dynamic Standing - Balance Support: During functional activity;Bilateral upper extremity supported Dynamic Standing - Level of Assistance: 3: Mod assist;2: Max assist Extremity Assessment      RLE Assessment General Strength Comments: grossly 3-/5 LLE Assessment General Strength Comments: grossly 3-/5    Refer to Care Plan for Long Term Goals  Recommendations for other services: None   Discharge Criteria: Patient will be discharged from PT if patient refuses treatment 3 consecutive times without medical reason, if treatment  goals not met, if there is a change in medical status, if patient makes no progress towards goals or if patient is discharged from hospital.  The above assessment, treatment plan, treatment alternatives and goals were discussed and mutually agreed upon: by patient  Emberly Tomasso 06/27/2018, 12:03 PM

## 2018-06-28 ENCOUNTER — Inpatient Hospital Stay (HOSPITAL_COMMUNITY): Payer: Medicare HMO | Admitting: Occupational Therapy

## 2018-06-28 ENCOUNTER — Inpatient Hospital Stay (HOSPITAL_COMMUNITY): Payer: Medicare HMO | Admitting: Physical Therapy

## 2018-06-28 DIAGNOSIS — D649 Anemia, unspecified: Secondary | ICD-10-CM

## 2018-06-28 DIAGNOSIS — R7309 Other abnormal glucose: Secondary | ICD-10-CM

## 2018-06-28 DIAGNOSIS — R000 Tachycardia, unspecified: Secondary | ICD-10-CM

## 2018-06-28 DIAGNOSIS — R7881 Bacteremia: Secondary | ICD-10-CM

## 2018-06-28 DIAGNOSIS — E871 Hypo-osmolality and hyponatremia: Secondary | ICD-10-CM

## 2018-06-28 LAB — GLUCOSE, CAPILLARY
GLUCOSE-CAPILLARY: 138 mg/dL — AB (ref 70–99)
GLUCOSE-CAPILLARY: 124 mg/dL — AB (ref 70–99)
Glucose-Capillary: 134 mg/dL — ABNORMAL HIGH (ref 70–99)
Glucose-Capillary: 155 mg/dL — ABNORMAL HIGH (ref 70–99)
Glucose-Capillary: 66 mg/dL — ABNORMAL LOW (ref 70–99)
Glucose-Capillary: 77 mg/dL (ref 70–99)

## 2018-06-28 NOTE — Progress Notes (Signed)
[place] PHYSICAL MEDICINE & REHABILITATION PROGRESS NOTE   Subjective/Complaints: Patient seen resting confortably in bed this morning.  However, upon waking up, she states he did not sleep well overnight.  She notes she had a rough first day of therapies yesterday.  ROS: Denies CP, SOB, N/V/D  Objective:   Vas [place] Lower Extremity Venous (dvt)  Result Date: 06/27/2018  Lower Venous Study Indications: Edema.  Performing Technologist: Toma Copier RVS  Examination Guidelines: A complete evaluation includes B-mode imaging, spectral Doppler, color Doppler, and power Doppler as needed of all accessible portions of each vessel. Bilateral testing is considered an integral part of a complete examination. Limited examinations for reoccurring indications may be performed as noted.  Right Venous Findings: +---------+---------------+---------+-----------+----------+-------------------+          CompressibilityPhasicitySpontaneityPropertiesSummary             +---------+---------------+---------+-----------+----------+-------------------+ CFV      Full                                                             +---------+---------------+---------+-----------+----------+-------------------+ SFJ      Full                                                             +---------+---------------+---------+-----------+----------+-------------------+ FV Prox  Full                                                             +---------+---------------+---------+-----------+----------+-------------------+ FV Mid   Full                                                             +---------+---------------+---------+-----------+----------+-------------------+ FV DistalFull                                                             +---------+---------------+---------+-----------+----------+-------------------+ PFV      Full                                                              +---------+---------------+---------+-----------+----------+-------------------+ POP      Full                                                             +---------+---------------+---------+-----------+----------+-------------------+  PTV      Full                                                             +---------+---------------+---------+-----------+----------+-------------------+ PERO                                                  Difficult to image                                                        due to patient                                                            beimng unale to                                                           rotate the leg for                                                        optimal imaging     +---------+---------------+---------+-----------+----------+-------------------+  Right Technical Findings: Not visualized segments include Peroneal. See summary comments above  Left Venous Findings: +---------+---------------+---------+-----------+----------+-------------------+          CompressibilityPhasicitySpontaneityPropertiesSummary             +---------+---------------+---------+-----------+----------+-------------------+ CFV      Full           Yes      Yes                                      +---------+---------------+---------+-----------+----------+-------------------+ SFJ      Full                                                             +---------+---------------+---------+-----------+----------+-------------------+ FV Prox  Full           Yes      Yes                                      +---------+---------------+---------+-----------+----------+-------------------+ FV Mid   Full                                                             +---------+---------------+---------+-----------+----------+-------------------+  FV DistalFull           Yes       Yes                                      +---------+---------------+---------+-----------+----------+-------------------+ PFV      Full           Yes      Yes                                      +---------+---------------+---------+-----------+----------+-------------------+ POP      Full           Yes      Yes                                      +---------+---------------+---------+-----------+----------+-------------------+ PTV      Full                                                             +---------+---------------+---------+-----------+----------+-------------------+ PERO                                                  Color flow appears                                                        normal however                                                            difficult to image                                                        due to edema        +---------+---------------+---------+-----------+----------+-------------------+  Left Technical Findings: Not visualized segments include limited peroneal due to edema. See summary comments listed above   Summary: Right: There is no evidence of deep vein thrombosis in the lower extremity. However, portions of this examination were limited- see technologist comments above. No cystic structure found in the popliteal fossa. Left: There is no evidence of deep vein thrombosis in the lower extremity. However, portions of this examination were limited- see technologist comments above. No cystic structure found in the popliteal fossa.  *See table(s) above for measurements and observations. Electronically signed by Monica Martinez MD on 06/27/2018 at 3:48:45 PM.    Final    Recent  Labs    06/26/18 1851 06/27/18 0341  WBC 8.2 6.6  HGB 7.9* 7.9*  HCT 24.8* 25.1*  PLT 313 320   Recent Labs    06/26/18 0323 06/26/18 1851 06/27/18 0341  NA 131*  --  132*  K 3.8  --  4.1  CL 89*  --  92*  CO2  30  --  32  GLUCOSE 143*  --  95  BUN 15  --  16  CREATININE 0.60 0.58 0.65  CALCIUM 8.2*  --  8.0*    Intake/Output Summary (Last 24 hours) at 06/28/2018 0817 Last data filed at 06/28/2018 0413 Gross per 24 hour  Intake 1650 ml  Output 2150 ml  Net -500 ml     Physical Exam: Vital Signs Blood pressure 139/70, pulse (!) 114, temperature 97.9 F (36.6 C), temperature source Oral, resp. rate 16, height 4\' 10"  (1.473 m), weight 77.4 kg, SpO2 97 %.  Constitutional: No distress . Vital signs reviewed. HENT: Normocephalic.  Atraumatic. Eyes: EOMI. No discharge. Cardiovascular: + Tachycardia.  Regular rhythm. No JVD. Respiratory: CTA Bilaterally. Normal effort. GI: BS +. Non-distended. Musc: No edema or tenderness in extremities. Neurological:Alert HOH Motor: Bilateral UE 4/5 proximal to distal.  Bilateral LE: Limited by pain ?2/5 prox to 3+/5 ADF/PF.  Skin:lesions bilateral lower extremities Right foot ulceration is dressed Psych: Confused.  Slow.  Assessment/Plan: 1. Functional deficits secondary to Lumbar discitis which require 3+ hours per day of interdisciplinary therapy in a comprehensive inpatient rehab setting.  Physiatrist is providing close team supervision and 24 hour management of active medical problems listed below.  Physiatrist and rehab team continue to assess barriers to discharge/monitor patient progress toward functional and medical goals  Care Tool:  Bathing    Body parts bathed by patient: Right arm, Chest, Left arm, Abdomen, Front perineal area, Face   Body parts bathed by helper: Front perineal area, Buttocks     Bathing assist Assist Level: Maximal Assistance - Patient 24 - 49%     Upper Body Dressing/Undressing Upper body dressing   What is the patient wearing?: Pull over shirt    Upper body assist Assist Level: Minimal Assistance - Patient > 75%    Lower Body Dressing/Undressing Lower body dressing      What is the patient wearing?:  Incontinence brief     Lower body assist Assist for lower body dressing: Maximal Assistance - Patient 25 - 49%     Toileting Toileting Toileting Activity did not occur (Clothing management and hygiene only): Safety/medical concerns  Toileting assist Assist for toileting: Maximal Assistance - Patient 25 - 49%     Transfers Chair/bed transfer  Transfers assist  Chair/bed transfer activity did not occur: Safety/medical concerns  Chair/bed transfer assist level: Moderate Assistance - Patient 50 - 74%     Locomotion Ambulation   Ambulation assist      Assist level: Moderate Assistance - Patient 50 - 74% Assistive device: Walker-rolling Max distance: 5   Walk 10 feet activity   Assist  Walk 10 feet activity did not occur: Safety/medical concerns        Walk 50 feet activity   Assist Walk 50 feet with 2 turns activity did not occur: Safety/medical concerns         Walk 150 feet activity   Assist Walk 150 feet activity did not occur: Safety/medical concerns         Walk 10 feet on uneven surface  activity   Assist Walk 10  feet on uneven surfaces activity did not occur: Safety/medical concerns         Wheelchair     Assist Will patient use wheelchair at discharge?: No             Wheelchair 50 feet with 2 turns activity    Assist            Wheelchair 150 feet activity     Assist          Medical Problem List and Plan: 1.Decreased functional mobilitysecondary to L1-2 discitis/ osteomyelitis as well as right foot ulcerationcomplicated by cardiac arrest  Continue CIR  Notes reviewed, labs reviewed 2. DVT Prophylaxis/Anticoagulation: Subcutaneous heparin.     Vascular studies limited, but negative for DVT 3. Pain Management:Hydrocodone,Robaxin as needed  Scheduled hydrocodone prior therapies 4. Mood:Provide emotional support 5. Neuropsych: This patientiscapable of making decisions on herown behalf. 6.  Skin/Wound Care/right foot wound ulceration:  -WOC RN, appreciate wound care recs  -apparent lesions on legs, appear to be resolving 7. Fluids/Electrolytes/Nutrition: 8. MSSA bacteremia.IV Ancef until 08/13/2018. Follow-up per infectious disease 9. Acute on chronic anemia.   -diet/fe+ supp  -Hgb 7.9 on 2/7 10. Diabetes mellitus with peripheral neuropathy. Hemoglobin A1c 8.7. Lantus insulin 25 units daily.    Labile on 2/8 11. Hypertension. Lisinopril 10 mg daily,Lasix 40 mg daily,Lopressor 25 mg twice a day.   Relatively controlled on 2/8 12. B12 deficiency.Continue vitamin B12 13.  Hyponatremia  Sodium 132 on 2/7  Continue to monitor 14.  Sinus tachycardia  Reviewed the ECG, confirmed  Consider medications tomorrow if persistently elevated  LOS: 2 days A FACE TO FACE EVALUATION WAS PERFORMED  Karsynn Deweese Lorie Phenix 06/28/2018, 8:17 AM

## 2018-06-28 NOTE — Progress Notes (Addendum)
Physical Therapy Session Note  Patient Details  Name: Kaitlin Vargas MRN: [id number] Date of Birth: July 03, 1950  Today's Date: 06/28/2018 PT Individual Time: 6222-9798 PT Individual Time Calculation (min): 55 min   Short Term Goals: Week 1:  PT Short Term Goal 1 (Week 1): pt will perform functional transfers with min A PT Short Term Goal 2 (Week 1): pt will perform gait x 15' with min A in controlled environment  Skilled Therapeutic Interventions/Progress Updates:   Pt in recliner and agreeable to therapy, pain 8/10 in lower back. Stand pivot transfer to w/c w/ RW and increased time 2/2 pain guarding and needing to rest in between each transitional movement. Mod assist to stand, CGA for transfer. Total assist w/c transport to/from therapy gym for time management. Session focused on overall tolerance to activity, gentle ROM and strengthening of BLEs, and endurance. Performed BLE exercises as listed below. Ambulated 22' w/ CGA-min assist and close w/c follow for safety, however did not need it. Rest breaks throughout session 2/2 fatigue/pain. No significant increase in pain w/ activity. Returned to room and pt requesting to toilet, stedy transfer to [facility] for energy conservation, min-mod assist to stand in stedy, and total assist for pericare and brief management. Stedy transfer to EOB. Max assist bed mobility 2/2 pain. Ended session in supine, all needs in reach.   BLE exercises:  -LAQs 2x10 -ankle pumps 2x10 -calf raises 2x10 -passive calf stretch bilaterally, 30 sec x3  Therapy Documentation Precautions:  Precautions Precautions: Fall, Back Precaution Comments: back precautions for comfort Restrictions Weight Bearing Restrictions: No Vital Signs: Therapy Vitals Temp: 99 F (37.2 C) Temp Source: Oral Pulse Rate: 97 Resp: 18 BP: 122/65 Patient Position (if appropriate): Sitting Oxygen Therapy SpO2: 91 % O2 Device: Room Air Pain: Pain Assessment Pain Scale: 0-10 Pain Score: 6  Pain  Type: Chronic pain Pain Location: Back Pain Orientation: Lower Pain Descriptors / Indicators: Aching;Stabbing Pain Frequency: Constant Pain Onset: On-going Patients Stated Pain Goal: 3 Pain Intervention(s): Medication (See eMAR)  Therapy/Group: Individual Therapy  Stan Cantave K Wilborn Membreno 06/28/2018, 4:07 PM

## 2018-06-28 NOTE — Progress Notes (Signed)
Occupational Therapy Session Note  Patient Details  Name: Kaitlin Vargas MRN: [id number] Date of Birth: 19-Jan-1951  Today's Date: 06/28/2018 OT Individual Time: 7564-3329 and 5188-4166 OT Individual Time Calculation (min): 53 min and 62 min 22 minutes missed due to RN care.   Short Term Goals: Week 1:  OT Short Term Goal 1 (Week 1): Pt will tolerate standing for 2 mins with min A to increase endurance for BADL tasks OT Short Term Goal 2 (Week 1): Pt will complete 1 step of LB dressing task OT Short Term Goal 3 (Week 1): Pt will complete stand-piovt toilet transfer with mod A OT Short Term Goal 4 (Week 1): Pt will complete sit<>stand with min A  Skilled Therapeutic Interventions/Progress Updates:    22 minutes missed initially due to cathing. Pt greeted in bed with perihygiene completed, per RN staff. Pt agreeable to get up in w/c to finish washing up. Supine<sit with Max A via logroll technique. Mod A sit<stand and stand pivot transfer to w/c using RW. Pt able to doff her gown with Min A, and proceeded with UB/LB bathing with supervision overall excluding feet. Per pt, RN had applied foot ointment earlier and therefore opted not to wash. OT assisted with Teds and sneakers. Max A sit<stand at sink to pull down gown and also for pt to complete oral care while standing at sink. She required bilateral elbow support on sink while cleaning tentures. Vcs provided for tucking in buttocks to improve upright alignment, though pt still with very hunched posture. Able to stand for 1 min 45 seconds before needing to sit back down. We also washed her hair using a shower cap and pt combed hair with setup assist after. Pt left in w/c with all needs within reach and safety belt fastened at end of session.   2nd Session 1:1 tx (62 min) Pt greeted in w/c and agreeable to tx. Declining toileitng. Escorted her via w/c to dayroom and worked on sit<stands and standing tolerance to improve these skills during self care.  Sit<stand at high-low table completed with Max A and vcs for technique. Pt able to stand and complete small side-steps Lt>Rt while washing table with steady assist and vcs/manual facilitation for upright alignment due to kyphosis. Tried to get her to reduce reliance on B elbow support by encouraging pushing up through palms. Pt reported improvement of back pain with improved posture. Longest standing window without rest 4 minutes 30 seconds! She required multiple seated rest breaks due to pain/fatigue. Discussed holistic pain mgt strategies, such as music listening and reading (her hobby). Provided her with a free reading book for room use. Pt then returned to room and transferred to recliner with Mod A via stand pivot using RW. Repositioned pt in the recliner for comfort with LEs elevated for edema mgt. Provided her with a MHP for lower back. Discussed with RN possibility of order pt k-pad if hot pack helps with back pain. RN verbalized understanding. Pt left with spouse at session exit.   Therapy Documentation Precautions:  Precautions Precautions: Fall, Back Precaution Comments: back precautions for comfort Restrictions Weight Bearing Restrictions: No Pain: 8/10 in back. Per RN, premedicated. OT also provided pt with lavender to use via inhalation and instructed her through deep breathing for decreasing pain perception via increase of PNS activity. Pain addressed as written above during 2nd session   Pain Assessment Pain Scale: 0-10 Pain Score: 8  Pain Type: Chronic pain Pain Location: Back Pain Orientation: Lower Pain Descriptors /  Indicators: Aching;Stabbing Pain Frequency: Constant Pain Onset: On-going Patients Stated Pain Goal: 3 Pain Intervention(s): Medication (See eMAR) ADL: ADL Eating: Set up Grooming: Setup Upper Body Bathing: Minimal assistance Lower Body Bathing: Maximal assistance Upper Body Dressing: Minimal assistance Lower Body Dressing: Dependent Toileting: Not  assessed Toilet Transfer: Maximal assistance Toilet Transfer Method: Stand pivot      Therapy/Group: Individual Therapy  Maxmillian Carsey A Maelynn Moroney 06/28/2018, 12:29 PM

## 2018-06-29 ENCOUNTER — Inpatient Hospital Stay (HOSPITAL_COMMUNITY): Payer: Medicare HMO | Admitting: Occupational Therapy

## 2018-06-29 LAB — GLUCOSE, CAPILLARY
GLUCOSE-CAPILLARY: 174 mg/dL — AB (ref 70–99)
GLUCOSE-CAPILLARY: 161 mg/dL — AB (ref 70–99)
Glucose-Capillary: 154 mg/dL — ABNORMAL HIGH (ref 70–99)
Glucose-Capillary: 124 mg/dL — ABNORMAL HIGH (ref 70–99)

## 2018-06-29 NOTE — Progress Notes (Signed)
Occupational Therapy Session Note  Patient Details  Name: Kaitlin Vargas MRN: [id number] Date of Birth: 06/21/1950  Today's Date: 06/29/2018 OT Individual Time: 0900-1011 OT Individual Time Calculation (min): 71 min   Short Term Goals: Week 1:  OT Short Term Goal 1 (Week 1): Pt will tolerate standing for 2 mins with min A to increase endurance for BADL tasks OT Short Term Goal 2 (Week 1): Pt will complete 1 step of LB dressing task OT Short Term Goal 3 (Week 1): Pt will complete stand-piovt toilet transfer with mod A OT Short Term Goal 4 (Week 1): Pt will complete sit<>stand with min A  Skilled Therapeutic Interventions/Progress Updates:    Pt greeted in bed, reporting that MHP yesterday really helped with managing her back pain. RN staff used hot packs for her last night. Discussed with RN order of k-pad and she verbalized agreement. Pt also stated that at this time the w/c felt more comfortable than recliner and that she'd rather be in w/c at end of session for sitting up. Supine<sit completed with Min A using logroll technique! She ambulated short distance to w/c placed near sink using RW with Min A sit<stand, and steady assist for ambulation! Pt completed bathing/dressing w/c level at sink. Issued her LH sponge to wash LEs. She required assist for perihygiene due to soiled brief though pt stated she felt clean beforehand. Pt able to stand for 3 minutes at this time with B elbow support on sink. Sit<stand was Mod A, and her next sit<stand Min A to pull down her gown (pt able to assist with pulling down). After, worked on AE training via use of shoe horn to Museum/gallery conservator. She required Max A and vcs to do so, and would benefit from additional training. She completed oral care and grooming tasks w/c level with supervision/setup and increased time. OT donned Teds. At end of session pt was left in w/c with MHP for lower back, all needs within reach, and safety belt fastened.   Therapy  Documentation Precautions:  Precautions Precautions: Fall, Back Precaution Comments: back precautions for comfort Restrictions Weight Bearing Restrictions: No Pain: in lower back. Provided MHP at end of tx  Pain Assessment Pain Scale: 0-10 Pain Score: Asleep ADL: ADL Eating: Set up Grooming: Setup Upper Body Bathing: Minimal assistance Lower Body Bathing: Maximal assistance Upper Body Dressing: Minimal assistance Lower Body Dressing: Dependent Toileting: Not assessed Toilet Transfer: Maximal assistance Toilet Transfer Method: Stand pivot      Therapy/Group: Individual Therapy  [name] [name] 06/29/2018, 12:33 PM

## 2018-06-29 NOTE — Progress Notes (Signed)
[facility] PHYSICAL MEDICINE & REHABILITATION PROGRESS NOTE   Subjective/Complaints: Patient seen laying in bed this morning.  She states she slept better last night with a heating pad.  ROS: Denies CP, SOB, N/V/D  Objective:   No results found. Recent Labs    06/27/18 0341  WBC 6.6  HGB 7.9*  HCT 25.1*  PLT 320   Recent Labs    06/27/18 0341  NA 132*  K 4.1  CL 92*  CO2 32  GLUCOSE 95  BUN 16  CREATININE 0.65  CALCIUM 8.0*    Intake/Output Summary (Last 24 hours) at 06/29/2018 2101 Last data filed at 06/29/2018 1845 Gross per 24 hour  Intake 832 ml  Output 2071 ml  Net -1239 ml     Physical Exam: Vital Signs Blood pressure (!) 149/77, pulse 99, temperature 98.5 F (36.9 C), temperature source Oral, resp. rate 18, height 4\' 10"  (1.473 m), weight 77.4 kg, SpO2 96 %.  Constitutional: No distress . Vital signs reviewed. HENT: Normocephalic.  Atraumatic. Eyes: EOMI. No discharge. Cardiovascular: RRR.  No JVD. Respiratory: CTA bilaterally.  Normal effort. GI: BS +. Non-distended. Musc: No edema or tenderness in extremities. Neurological: Alert HOH Motor: Bilateral UE 4/5 proximal to distal.  Bilateral LE: Limited by pain ?2+/5 prox to 3+/5 ADF/PF.  Skin:lesions bilateral lower extremities Right foot ulceration is dressed Psych: Normal mood.  Normal behavior.  Assessment/Plan: 1. Functional deficits secondary to Lumbar discitis which require 3+ hours per day of interdisciplinary therapy in a comprehensive inpatient rehab setting.  Physiatrist is providing close team supervision and 24 hour management of active medical problems listed below.  Physiatrist and rehab team continue to assess barriers to discharge/monitor patient progress toward functional and medical goals  Care Tool:  Bathing    Body parts bathed by patient: Right arm, Chest, Left arm, Abdomen, Face, Right upper leg, Left upper leg, Front perineal area, Right lower leg, Left lower leg   Body parts bathed by helper: Buttocks Body parts n/a: Front perineal area, Buttocks   Bathing assist Assist Level: Moderate Assistance - Patient 50 - 74%     Upper Body Dressing/Undressing Upper body dressing   What is the patient wearing?: Dress    Upper body assist Assist Level: Minimal Assistance - Patient > 75%    Lower Body Dressing/Undressing Lower body dressing      What is the patient wearing?: Incontinence brief     Lower body assist Assist for lower body dressing: Total Assistance - Patient < 25%     Toileting Toileting Toileting Activity did not occur Landscape architect and hygiene only): Refused  Toileting assist Assist for toileting: Maximal Assistance - Patient 25 - 49%     Transfers Chair/bed transfer  Transfers assist  Chair/bed transfer activity did not occur: Safety/medical concerns  Chair/bed transfer assist level: Moderate Assistance - Patient 50 - 74%     Locomotion Ambulation   Ambulation assist      Assist level: Minimal Assistance - Patient > 75% Assistive device: Walker-rolling Max distance: 40'   Walk 10 feet activity   Assist  Walk 10 feet activity did not occur: Safety/medical concerns  Assist level: Minimal Assistance - Patient > 75% Assistive device: Walker-rolling   Walk 50 feet activity   Assist Walk 50 feet with 2 turns activity did not occur: Safety/medical concerns         Walk 150 feet activity   Assist Walk 150 feet activity did not occur: Safety/medical concerns  Walk 10 feet on uneven surface  activity   Assist Walk 10 feet on uneven surfaces activity did not occur: Safety/medical concerns         Wheelchair     Assist Will patient use wheelchair at discharge?: No             Wheelchair 50 feet with 2 turns activity    Assist            Wheelchair 150 feet activity     Assist          Medical Problem List and Plan: 1.Decreased functional  mobilitysecondary to L1-2 discitis/ osteomyelitis as well as right foot ulcerationcomplicated by cardiac arrest  Continue CIR 2. DVT Prophylaxis/Anticoagulation: Subcutaneous heparin.     Vascular studies limited, but negative for DVT 3. Pain Management:Hydrocodone,Robaxin as needed  Scheduled hydrocodone prior therapies 4. Mood:Provide emotional support 5. Neuropsych: This patientiscapable of making decisions on herown behalf. 6. Skin/Wound Care/right foot wound ulceration:  -WOC RN, appreciate wound care recs  -apparent lesions on legs, appear to be resolving 7. Fluids/Electrolytes/Nutrition: 8. MSSA bacteremia.IV Ancef until 08/13/2018. Follow-up per infectious disease 9. Acute on chronic anemia.   -diet/fe+ supp  -Hgb 7.9 on 2/7 10. Diabetes mellitus with peripheral neuropathy. Hemoglobin A1c 8.7. Lantus insulin 25 units daily.    Labile, but stabilizing on 2/9 11. Hypertension. Lisinopril 10 mg daily,Lasix 40 mg daily,Lopressor 25 mg twice a day.   Labile on 2/9 12. B12 deficiency.Continue vitamin B12 13.  Hyponatremia  Sodium 132 on 2/7  Continue to monitor 14.  Sinus tachycardia  Reviewed the ECG, confirmed  Appears to be improving overall  LOS: 3 days A FACE TO FACE EVALUATION WAS PERFORMED  Derin Granquist Lorie Phenix 06/29/2018, 9:01 PM

## 2018-06-30 ENCOUNTER — Inpatient Hospital Stay (HOSPITAL_COMMUNITY): Payer: Medicare HMO | Admitting: Occupational Therapy

## 2018-06-30 ENCOUNTER — Inpatient Hospital Stay (HOSPITAL_COMMUNITY): Payer: Medicare HMO | Admitting: Physical Therapy

## 2018-06-30 LAB — GLUCOSE, CAPILLARY
Glucose-Capillary: 135 mg/dL — ABNORMAL HIGH (ref 70–99)
Glucose-Capillary: 41 mg/dL — CL (ref 70–99)
Glucose-Capillary: 122 mg/dL — ABNORMAL HIGH (ref 70–99)
Glucose-Capillary: 111 mg/dL — ABNORMAL HIGH (ref 70–99)
Glucose-Capillary: 79 mg/dL (ref 70–99)

## 2018-06-30 NOTE — Progress Notes (Signed)
Occupational Therapy Session Note  Patient Details  Name: Kaitlin Vargas MRN: [id number] Date of Birth: December 06, 1950  Today's Date: 06/30/2018  Session 1 OT Individual Time: 7371-0626 OT Individual Time Calculation (min): 60 min   Session 2 OT Individual Time: 9485-4627 OT Individual Time Calculation (min): 73 min   Short Term Goals: Week 1:  OT Short Term Goal 1 (Week 1): Pt will tolerate standing for 2 mins with min A to increase endurance for BADL tasks OT Short Term Goal 2 (Week 1): Pt will complete 1 step of LB dressing task OT Short Term Goal 3 (Week 1): Pt will complete stand-piovt toilet transfer with mod A OT Short Term Goal 4 (Week 1): Pt will complete sit<>stand with min A  Skilled Therapeutic Interventions/Progress Updates:    Session 1 Pt greeted side-lying in bed, reporting 7/10 pain but agreeable to participate in therapy. Pt completed log roll with increased time and min A. Mod A to come from sidelying to sitting. Stand-pivot to wc with RW, increased time to stand and min A> Bathing/dressing completed wc at the sink with min A for UB bathing/dressing and max A for LB bathing/dressing. Total A to don TED hose and socks 2/2 pain. Sit<>stands with min/mod A pending fatigue level. Pt able to remove unilateral UE from the sink today in standing to help pull up pants, but still needed assistance to get them above hips. Pt reported need for bathroom. Stand-pivot wc> regular commode with min/mod A using grab bars for transfer to commode and RW when transferring back to wc. Pt voided bladder successfully and needed max A for toileting and clothing management. Pt left seated in wc at end of session with alarm belt on and call bell in reach.   Session 2 Pt greeted sitting in wc and agreeable to OT treatment session. Pt brought to therapy gym in wc. Pt stood 3x for 5 minutes each while completing peg board puzzle in preparation for BADL tasks. Mod verbal cues initially to understand pattern on  medium-hard puzzle. Dynamic standing horse shoe toss activity with min A sit<>stand and verbal cues for hand placement on wc and RW. Pt able to remove unilateral UE to toss horse shoe. Pt reported her bottom was sore from sitting in wc so OT exchanged wc and wc cushion for comfort. Pt returned to room and lunch tray set-up. Safety belt on and husband present.   Therapy Documentation Precautions:  Precautions Precautions: Fall, Back Precaution Comments: back precautions for comfort Restrictions Weight Bearing Restrictions: No Pain: Pain Assessment Pain Scale: 0-10 Pain Score: 7  Pain Type: Chronic pain Pain Location: Back Pain Orientation: Lower Pain Descriptors / Indicators: Aching Pain Onset: On-going Pain Intervention(s): Repositioned  Therapy/Group: Individual Therapy  Valma Cava 06/30/2018, 11:59 AM

## 2018-06-30 NOTE — Progress Notes (Addendum)
[facility] PHYSICAL MEDICINE & REHABILITATION PROGRESS NOTE   Subjective/Complaints: Pt side lying in bed. "tail bone" hurts from sitting up too long yesterday. Pain overall really hasn't changed. Trying to work through it  ROS: Patient denies fever, rash, sore throat, blurred vision, nausea, vomiting, diarrhea, cough, shortness of breath or chest pain,  headache, or mood change.    Objective:   No results found. No results for input(s): WBC, HGB, HCT, PLT in the last 72 hours. No results for input(s): NA, K, CL, CO2, GLUCOSE, BUN, CREATININE, CALCIUM in the last 72 hours.  Intake/Output Summary (Last 24 hours) at 06/30/2018 0832 Last data filed at 06/30/2018 0345 Gross per 24 hour  Intake 360 ml  Output 1221 ml  Net -861 ml     Physical Exam: Vital Signs Blood pressure (!) 157/89, pulse (!) 101, temperature 98.1 F (36.7 C), temperature source Oral, resp. rate 18, height 4\' 10"  (1.473 m), weight 77.4 kg, SpO2 95 %.  Constitutional: No distress . Vital signs reviewed. HEENT: EOMI, oral membranes moist Neck: supple Cardiovascular: RRR without murmur. No JVD    Respiratory: CTA Bilaterally without wheezes or rales. Normal effort    GI: BS +, non-tender, non-distended  Musc: No edema or tenderness in extremities. Neurological: Alert HOH Motor: Bilateral UE 4/5 proximal to distal.  Bilateral LE: Limited by pain ?2+/5 prox to 3+/5 ADF/PF.  Skin:lesions bilateral lower extremities Right foot ulceration is dressed Psych: Normal mood.  Normal behavior.  Assessment/Plan: 1. Functional deficits secondary to Lumbar discitis which require 3+ hours per day of interdisciplinary therapy in a comprehensive inpatient rehab setting.  Physiatrist is providing close team supervision and 24 hour management of active medical problems listed below.  Physiatrist and rehab team continue to assess barriers to discharge/monitor patient progress toward functional and medical goals  Care  Tool:  Bathing    Body parts bathed by patient: Right arm, Chest, Left arm, Abdomen, Face, Right upper leg, Left upper leg, Front perineal area, Right lower leg, Left lower leg   Body parts bathed by helper: Buttocks Body parts n/a: Front perineal area, Buttocks   Bathing assist Assist Level: Moderate Assistance - Patient 50 - 74%     Upper Body Dressing/Undressing Upper body dressing   What is the patient wearing?: Dress    Upper body assist Assist Level: Minimal Assistance - Patient > 75%    Lower Body Dressing/Undressing Lower body dressing      What is the patient wearing?: Incontinence brief     Lower body assist Assist for lower body dressing: Total Assistance - Patient < 25%     Toileting Toileting Toileting Activity did not occur Landscape architect and hygiene only): Refused  Toileting assist Assist for toileting: Maximal Assistance - Patient 25 - 49%     Transfers Chair/bed transfer  Transfers assist  Chair/bed transfer activity did not occur: Safety/medical concerns  Chair/bed transfer assist level: Moderate Assistance - Patient 50 - 74%     Locomotion Ambulation   Ambulation assist      Assist level: Minimal Assistance - Patient > 75% Assistive device: Walker-rolling Max distance: 40'   Walk 10 feet activity   Assist  Walk 10 feet activity did not occur: Safety/medical concerns  Assist level: Minimal Assistance - Patient > 75% Assistive device: Walker-rolling   Walk 50 feet activity   Assist Walk 50 feet with 2 turns activity did not occur: Safety/medical concerns         Walk 150 feet  activity   Assist Walk 150 feet activity did not occur: Safety/medical concerns         Walk 10 feet on uneven surface  activity   Assist Walk 10 feet on uneven surfaces activity did not occur: Safety/medical concerns         Wheelchair     Assist Will patient use wheelchair at discharge?: No             Wheelchair 50  feet with 2 turns activity    Assist            Wheelchair 150 feet activity     Assist          Medical Problem List and Plan: 1.Decreased functional mobilitysecondary to L1-2 discitis/ osteomyelitis as well as right foot ulcerationcomplicated by cardiac arrest  Continue CIR  -pain remains a limiting factor 2. DVT Prophylaxis/Anticoagulation: Subcutaneous heparin.     Vascular studies limited, but negative for DVT 3. Pain Management:Hydrocodone,Robaxin as needed  -heat, ice discussed as well.  4. Mood:Provide emotional support 5. Neuropsych: This patientiscapable of making decisions on herown behalf. 6. Skin/Wound Care/right foot wound ulceration:  -WOC RN, appreciate wound care recs  -  lesions on legs, appear to be resolving 7. Fluids/Electrolytes/Nutrition: 8. MSSA bacteremia.IV Ancef until 08/13/2018. Follow-up per infectious disease 9. Acute on chronic anemia.   -diet/fe+ supp  -Hgb 7.9 on 2/7 10. Diabetes mellitus with peripheral neuropathy. Hemoglobin A1c 8.7. Lantus insulin 25 units daily.    Improving control 2/10 11. Hypertension. Lisinopril 10 mg daily,Lasix 40 mg daily,Lopressor 25 mg twice a day.   Remains somewhat 2/10, pain factor 12. B12 deficiency.Continue vitamin B12 13.  Hyponatremia  Sodium 132 on 2/7  Continue to monitor    LOS: 4 days A FACE TO [place] 06/30/2018, 8:32 AM

## 2018-06-30 NOTE — Progress Notes (Signed)
Patient CBG was 41 at 1201. Lunch tray was given to patient and after eating and drinking a soda, per protocol, CBG was rechecked 122. Doy Hutching, LPN

## 2018-06-30 NOTE — Progress Notes (Signed)
Physical Therapy Session Note  Patient Details  Name: Kaitlin Vargas MRN: [id number] Date of Birth: January 26, 1951  Today's Date: 06/30/2018 PT Individual Time: 7793-9030 PT Individual Time Calculation (min): 58 min   Short Term Goals: Week 1:  PT Short Term Goal 1 (Week 1): pt will perform functional transfers with min A PT Short Term Goal 2 (Week 1): pt will perform gait x 15' with min A in controlled environment  Skilled Therapeutic Interventions/Progress Updates:   Pt received sitting up in w/c and agreeable to tx. Pt c/o muscle spasms in back and legs and pain 7/10 in back; therapist notifies nurse and medication for both was administered. Pt wheeled around unit total assist for time management. Pt sits<>stands with mod assist and ambulates 10 ft with RW and min assist limited by pain and spasms. Pt wheeled back to room total assist and performs toilet transfer stand-pivot with mod assist. Pt voids bladder and able to perform self-hygiene in standing with assistance from therapist to maintain balance. Pt transfers w/c>bed with RW and mod assist. Pt transfers sitting>lying in bed with mod assist to get legs onto bed and to scoot up in bed. Pt performs bed exercises of heel slides and SAQ 2 x 10 active assisted for BLE strengthening. Pt states the pain has increased to 10/10, and she is unable to participate in additional therapy. Pt left lying in bed with bed alarm on and all needs in reach. RN notified of increase in pain.   Therapy Documentation Precautions:  Precautions Precautions: Fall, Back Precaution Comments: back precautions for comfort Restrictions Weight Bearing Restrictions: No General: PT Amount of Missed Time (min): 17 Minutes  Therapy/Group: Individual Therapy  Toretto Tingler 06/30/2018, 3:41 PM

## 2018-07-01 ENCOUNTER — Inpatient Hospital Stay (HOSPITAL_COMMUNITY): Payer: Medicare HMO | Admitting: Occupational Therapy

## 2018-07-01 ENCOUNTER — Inpatient Hospital Stay (HOSPITAL_COMMUNITY): Payer: Medicare HMO | Admitting: Physical Therapy

## 2018-07-01 LAB — GLUCOSE, CAPILLARY
GLUCOSE-CAPILLARY: 156 mg/dL — AB (ref 70–99)
Glucose-Capillary: 83 mg/dL (ref 70–99)
Glucose-Capillary: 149 mg/dL — ABNORMAL HIGH (ref 70–99)
Glucose-Capillary: 74 mg/dL (ref 70–99)

## 2018-07-01 MED ORDER — MORPHINE SULFATE ER 15 MG PO TBCR
15.0000 mg | EXTENDED_RELEASE_TABLET | Freq: Two times a day (BID) | ORAL | Status: DC
  Administered 2018-07-01 – 2018-07-06 (×12): 15 mg via ORAL
  Filled 2018-07-01 (×13): qty 1

## 2018-07-01 NOTE — Progress Notes (Signed)
[facility] PHYSICAL MEDICINE & REHABILITATION PROGRESS NOTE   Subjective/Complaints: Still having tail bone pain. Affecting sleep and activity tolerance. Low grade temp  ROS: Patient denies  rash, sore throat, blurred vision, nausea, vomiting, diarrhea, cough, shortness of breath or chest pain,  headache, or mood change.   Objective:   No results found. No results for input(s): WBC, HGB, HCT, PLT in the last 72 hours. No results for input(s): NA, K, CL, CO2, GLUCOSE, BUN, CREATININE, CALCIUM in the last 72 hours.  Intake/Output Summary (Last 24 hours) at 07/01/2018 0729 Last data filed at 07/01/2018 0430 Gross per 24 hour  Intake 720 ml  Output 1360 ml  Net -640 ml     Physical Exam: Vital Signs Blood pressure (!) 148/87, pulse 100, temperature 99.5 F (37.5 C), temperature source Oral, resp. rate 18, height 4\' 10"  (1.473 m), weight 77.4 kg, SpO2 100 %.  Constitutional: No distress . Vital signs reviewed. HEENT: EOMI, oral membranes moist Neck: supple Cardiovascular: RRR without murmur. No JVD    Respiratory: CTA Bilaterally without wheezes or rales. Normal effort    GI: BS +, non-tender, non-distended  Musc: low back tender as well as sacral/gluteal area--really non-specific Neurological: Alert HOH Motor: Bilateral UE 4/5 proximal to distal.  Bilateral LE: Limited by pain ?2+/5 prox to 3+/5 ADF/PF.  Skin:lesions bilateral lower extremities Right foot ulceration is dressed. No skin break down at sacrum or IT's Psych: Normal mood.  Normal behavior.  Assessment/Plan: 1. Functional deficits secondary to Lumbar discitis which require 3+ hours per day of interdisciplinary therapy in a comprehensive inpatient rehab setting.  Physiatrist is providing close team supervision and 24 hour management of active medical problems listed below.  Physiatrist and rehab team continue to assess barriers to discharge/monitor patient progress toward functional and medical goals  Care  Tool:  Bathing    Body parts bathed by patient: Right arm, Chest, Left arm, Abdomen, Face, Right upper leg, Left upper leg, Front perineal area, Right lower leg, Left lower leg   Body parts bathed by helper: Buttocks Body parts n/a: Front perineal area, Buttocks   Bathing assist Assist Level: Moderate Assistance - Patient 50 - 74%     Upper Body Dressing/Undressing Upper body dressing   What is the patient wearing?: Dress    Upper body assist Assist Level: Minimal Assistance - Patient > 75%    Lower Body Dressing/Undressing Lower body dressing      What is the patient wearing?: Incontinence brief     Lower body assist Assist for lower body dressing: Total Assistance - Patient < 25%     Toileting Toileting Toileting Activity did not occur Landscape architect and hygiene only): Refused  Toileting assist Assist for toileting: Maximal Assistance - Patient 25 - 49%     Transfers Chair/bed transfer  Transfers assist  Chair/bed transfer activity did not occur: Safety/medical concerns  Chair/bed transfer assist level: Moderate Assistance - Patient 50 - 74%     Locomotion Ambulation   Ambulation assist      Assist level: Minimal Assistance - Patient > 75% Assistive device: Walker-rolling Max distance: 10 ft   Walk 10 feet activity   Assist  Walk 10 feet activity did not occur: Safety/medical concerns  Assist level: Minimal Assistance - Patient > 75% Assistive device: Walker-rolling   Walk 50 feet activity   Assist Walk 50 feet with 2 turns activity did not occur: Safety/medical concerns         Walk 150 feet activity  Assist Walk 150 feet activity did not occur: Safety/medical concerns         Walk 10 feet on uneven surface  activity   Assist Walk 10 feet on uneven surfaces activity did not occur: Safety/medical concerns         Wheelchair     Assist Will patient use wheelchair at discharge?: No             Wheelchair  50 feet with 2 turns activity    Assist            Wheelchair 150 feet activity     Assist          Medical Problem List and Plan: 1.Decreased functional mobilitysecondary to L1-2 discitis/ osteomyelitis as well as right foot ulcerationcomplicated by cardiac arrest  Continue CIR, team conf today  -pain remains a limiting factor 2. DVT Prophylaxis/Anticoagulation: Subcutaneous heparin.     Vascular studies limited, but negative for DVT 3. Pain Management:Hydrocodone,Robaxin as needed  -heat, ice discussed as well.   -add low dose ms contin  -persistent sacral/gluteal pain concerning given low grade temp   -consider CT of back/pelvis   -will work on pain control first.    -follow up labs tomorrow 4. Mood:Provide emotional support 5. Neuropsych: This patientiscapable of making decisions on herown behalf. 6. Skin/Wound Care/right foot wound ulceration:  -WOC RN, appreciate wound care recs  -  lesions on legs, appear to be resolving 7. Fluids/Electrolytes/Nutrition: 8. MSSA bacteremia.IV Ancef until 08/13/2018. Follow-up per infectious disease 9. Acute on chronic anemia.   -diet/fe+ supp  -Hgb 7.9 on 2/7 10. Diabetes mellitus with peripheral neuropathy. Hemoglobin A1c 8.7. Lantus insulin 25 units daily.    Improving control 2/11 11. Hypertension. Lisinopril 10 mg daily,Lasix 40 mg daily,Lopressor 25 mg twice a day.   Remains borderline 2/11, pain factor 12. B12 deficiency.Continue vitamin B12 13.  Hyponatremia  Sodium 132 on 2/7  Continue to monitor    LOS: 5 days A FACE TO FACE EVALUATION WAS PERFORMED  Meredith Staggers 07/01/2018, 7:29 AM

## 2018-07-01 NOTE — Progress Notes (Signed)
Occupational Therapy Session Note  Patient Details  Name: Kaitlin Vargas MRN: [id number] Date of Birth: Jan 27, 1951  Today's Date: 07/01/2018  Session 1 OT Individual Time: 1751-0258 OT Individual Time Calculation (min): 55 min   Session 2 OT Individual Time: 1000-1120 OT Individual Time Calculation (min): 80 min   Short Term Goals: Week 1:  OT Short Term Goal 1 (Week 1): Pt will tolerate standing for 2 mins with min A to increase endurance for BADL tasks OT Short Term Goal 2 (Week 1): Pt will complete 1 step of LB dressing task OT Short Term Goal 3 (Week 1): Pt will complete stand-piovt toilet transfer with mod A OT Short Term Goal 4 (Week 1): Pt will complete sit<>stand with min A  Skilled Therapeutic Interventions/Progress Updates:    Session 1 Pt greeted sidelying on bed, finishing breakfast and nursing administering medications. Pt reported 10/10 pain, but agreeable to try to get OOB. Pt needed extended time to move towards EOB with mod A. Stand-pivot to wc with RW and mod A to stand and min A to pivot to wc. UB bathing completed sitting at the sink with set-up A. Pt needed verbal cues to locate the deodorant as she though soap was deodorant. Sit<>stand at the sink with min A, min A for standing balance with B UE supported on the sink. Pt declined trying to was buttocks despite OT encouragement, requiring assistance. OT educated on use of reacher to assist with LB dressing. Pt with difficulty understanding grasp/release of reacher and needed hand over hand to demonstrate technique. Pt with better understanding upon 2nd trial to thread pants. Sit<>stand in similar fashion, then pt able to remove unilateral UE to help pull pants up over hips. OT provided pt with small heat packs for back pain. Pt left with alarm belt on and needs met.  Pain: Pain Assessment Pain Scale: 0-10 Pain Score: 10-Worst pain ever Pain Type: Chronic pain Pain Location: Back Pain Orientation: Lower Pain Radiating  Towards: legs Pain Descriptors / Indicators: Aching;Constant Pain Frequency: Constant Pain Onset: On-going Pain Intervention(s): Rest; Heat applied   Session 2 Pt greeted seated in wc with k-pad heat pack on for pain. Pt still reporting 9/10 pain, but agreeable to participate in therapy. Pt brought down to therapy gym and worked on functional ambulation. Sit<>stand with min A, then ambulated 70 feet with RW and min A. Pt needed extended rest break after ambulation. Worked on sit<>stand, standing balance, and standing endurance with standing ball toss activity. Pt needed min to mod A to maintain standing without UE support while performing dynamic activity of ball toss. Pt tolerated 4 reps of 20 tosses at approx 2 mins standing at each bout. Pt reported 10/10 back pain, so OT educated on deep breathing techniques and guided imagery techniques with some improvement. Pt completed UB there-ex using 1 lb dowel rod, 4 sets of 20- bicep curls, triceps press, and straight arm raises. Pt requests to do more bicep curls and complete 50 curls on last rep. Pt stated she had ready max fatigue. Pt declined to propel wc 2/2 pain. Total A to return to room in wc. Pt brushed her hair at the sink with set-up A, then OT braided hair for pt. OT placed k-pad on back for pain management and pt left with safety belt on and call bell in reach.   Therapy Documentation Precautions:  Precautions Precautions: Fall, Back Precaution Comments: back precautions for comfort Restrictions Weight Bearing Restrictions: No General: General OT Amount of  Missed Time: 10 Minutes Pain: Pain Assessment Pain Scale: 0-10 Pain Score: 10-Worst pain ever Pain Type: Chronic pain Pain Location: Back Pain Orientation: Lower Pain Radiating Towards: legs Pain Descriptors / Indicators: Aching;Constant Pain Frequency: Constant Pain Onset: On-going Pain Intervention(s): Rest, repositioned, heat applied ADL: ADL Eating: Set up Grooming:  Setup Upper Body Bathing: Minimal assistance Lower Body Bathing: Maximal assistance Upper Body Dressing: Minimal assistance Lower Body Dressing: Dependent Toileting: Not assessed Toilet Transfer: Maximal assistance Toilet Transfer Method: Stand pivot   Therapy/Group: Individual Therapy  Valma Cava 07/01/2018, 11:23 AM

## 2018-07-01 NOTE — Plan of Care (Signed)
  Problem: Consults Goal: RH GENERAL PATIENT EDUCATION Description See Patient Education module for education specifics. Pt will identify 2 goals to follow for her diabetes management.  Outcome: Progressing Goal: Skin Care Protocol Initiated - if Braden Score 18 or less Description Pt will maintain skin without breakdown to buttocks/peri area from pressure or MASD  Outcome: Progressing Goal: Nutrition Consult-if indicated Description Pt to have consult with diabetes educator if desires  Outcome: Progressing Goal: Diabetes Guidelines if Diabetic/Glucose > 140 Description If diabetic or lab glucose is > 140 mg/dl - Initiate Diabetes/Hyperglycemia Guidelines & Document Interventions    Outcome: Progressing   Problem: RH BOWEL ELIMINATION Goal: RH STG MANAGE BOWEL WITH ASSISTANCE Description STG Manage Bowel with min Assistance.  Outcome: Progressing Goal: RH STG MANAGE BOWEL W/MEDICATION W/ASSISTANCE Description STG Manage Bowel with Medication with min Assistance.  Outcome: Progressing   Problem: RH BLADDER ELIMINATION Goal: RH STG MANAGE BLADDER WITH ASSISTANCE Description STG Manage Bladder With min Assistance  Outcome: Progressing   Problem: RH SKIN INTEGRITY Goal: RH STG SKIN FREE OF INFECTION/BREAKDOWN Description No breakdown due to pressure or MASD  Outcome: Progressing Goal: RH STG MAINTAIN SKIN INTEGRITY WITH ASSISTANCE Description STG Maintain Skin Integrity With min Assistance.  Outcome: Progressing Goal: RH STG ABLE TO PERFORM INCISION/WOUND CARE W/ASSISTANCE Description STG Able To Perform Incision/Wound Care With min  Assistance. Right foot diabetic ulcer  Outcome: Progressing   Problem: RH SAFETY Goal: RH STG DECREASED RISK OF FALL WITH ASSISTANCE Description STG Decreased Risk of Fall With min Assistance.  Outcome: Progressing   Problem: RH PAIN MANAGEMENT Goal: RH STG PAIN MANAGED AT OR BELOW PT'S PAIN GOAL Description Pain <4  Outcome:  Progressing   Problem: RH KNOWLEDGE DEFICIT GENERAL Goal: RH STG INCREASE KNOWLEDGE OF SELF CARE AFTER HOSPITALIZATION Description Pt will have dates of follow up appointments.  Outcome: Progressing

## 2018-07-01 NOTE — Progress Notes (Signed)
Physical Therapy Session Note  Patient Details  Name: Kaitlin Vargas MRN: [id number] Date of Birth: 12-24-1950  Today's Date: 07/01/2018 PT Individual Time: 4239-5320 PT Individual Time Calculation (min): 55 min   Short Term Goals: Week 1:  PT Short Term Goal 1 (Week 1): pt will perform functional transfers with min A PT Short Term Goal 2 (Week 1): pt will perform gait x 15' with min A in controlled environment  Skilled Therapeutic Interventions/Progress Updates:   Pt in w/c and agreeable to therapy, pain 7/10 in lower back. Total assist w/c transport to/from therapy gym for time management. Worked on functional endurance and global strengthening this session. Ambulated 40-50' x4 w/ RW and CGA-min assist and performed LE strengthening exercises in between gait bouts as listed below. NuStep 4 min @ level 3 for global strengthening and endurance. Seated rest breaks throughout session 2/2 fatigue and increased work of breathing w/ all activity. Returned to room and ended session in w/c, all needs in reach. RN present providing pain medication at end of session.   BLE strengthening: -seated knee marches 2x10 -seated LAQs 2x10 -standing mini-squats 2x10  Therapy Documentation Precautions:  Precautions Precautions: Fall, Back Precaution Comments: back precautions for comfort Restrictions Weight Bearing Restrictions: No Vital Signs: Therapy Vitals Temp: 97.9 F (36.6 C) Pulse Rate: 97 Resp: 18 BP: 122/71 Patient Position (if appropriate): Sitting Oxygen Therapy SpO2: 98 % O2 Device: Room Air Pain: Pain Assessment Pain Score: 9   Therapy/Group: Individual Therapy  Akiva Josey Clent Demark 07/01/2018, 4:27 PM

## 2018-07-01 NOTE — Patient Care Conference (Signed)
Inpatient RehabilitationTeam Conference and Plan of Care Update Date: 07/01/2018   Time: 2:40 PM    Patient Name: Kaitlin Vargas      Medical Record Number: [id number]  Date of Birth: [birth year] Sex: Female         Room/Bed: 4W07C/4W07C-01 Payor Info: Payor: [employer] MEDICARE / Plan: [employer] / Product Type: *No Product type* /    Admitting Diagnosis: lumbar osteo  Admit Date/Time:  06/26/2018  3:22 PM Admission Comments: No comment available   Primary Diagnosis:  Lumbar discitis Principal Problem: Lumbar discitis  Patient Active Problem List   Diagnosis Date Noted  . Sinus tachycardia   . Hyponatremia   . Labile blood glucose   . Acute on chronic anemia   . Bacteremia   . Lumbar discitis 06/26/2018  . Cardiac arrest, cause unspecified ([place])   . Acute encephalopathy   . MSSA bacteremia 06/17/2018  . 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 DKA (diabetic ketoacidosis) ([place]) 06/16/2018  . Diabetic foot ulcer ([place]) 06/16/2018  . Acute hypoxemic respiratory failure ([place]) 06/07/2018  . Essential hypertension 06/07/2018  . Type 2 diabetes mellitus with hyperlipidemia ([place]) 06/07/2018  . Anemia 06/07/2018  . Acute bronchitis 06/07/2018    Expected Discharge Date: Expected Discharge Date: 07/15/18  Team Members Present: Physician leading conference: Dr. Alger Simons Social Worker Present: Lennart Pall, LCSW Nurse Present: Mohammed Kindle, LPN PT Present: Burnard Bunting, PT OT Present: Cherylynn Ridges, OT SLP Present: Weston Anna, SLP PPS Coordinator present : Gunnar Fusi     Current Status/Progress Goal Weekly Team Focus  Medical   lumbar discitis/osteo. abx. significant pain. htn worsened because of pain also  maximize pain control /improve activity tolerance  pain care, ID considerations, nutrition   Bowel/Bladder   Continent x2 LBM 06/28/2018   Continue to be continent of bowel and bladder. assist pt bathroom q2hours while awake  Assess toileting needs qshift and PRN   Swallow/Nutrition/  Hydration             ADL's   Max A LB ADLs, min/mod A stand pivot pending fatigue  Supervision  modified bathing/dressing, sit<>stand, transfers, activity tolerance, pain management   Mobility   min-mod assist sit<>stands, min assist gait up to 60' w/ RW, limited by back pain/endurance  supervision overall  endurance, pain management, global strengthening, all functional mobility    Communication             Safety/Cognition/ Behavioral Observations            Pain   C/o pain to lower back managed with PRN narco and robaxin  pain less than 4  assess pain management qshift and PRN   Skin   wound to right foot, rash to BLE  Ketoconazole to rash qshift,  dressing to right foot wound.  assess skin qshift and PRN    Rehab Goals Patient on target to meet rehab goals: Yes *See Care Plan and progress notes for long and short-term goals.     Barriers to Discharge  Current Status/Progress Possible Resolutions Date Resolved   Physician    Medical stability;Other (comments)  pain     see medical problem lists, orthotics, pacing, family ed      Nursing                  PT                    OT  SLP                SW                Discharge Planning/Teaching Needs:  Home with spouse and family who can provide 24/7 support.    Teaching needs TBD.   Team Discussion:  Pain continues to be an issues - pt premorbidly has chronic pain.  MD to monitor pain reports but no plans to do scans at this time. WOC following foot wound.  Min-mod assist with ADLs and amb ~ 35' with min assist today.  Despite complaints, pt's participation is good.  Supervision goals overall.  Will need home abx upon d/c - SW aware.  Revisions to Treatment Plan:  NA    Continued Need for Acute Rehabilitation Level of Care: The patient requires daily medical management by a physician with specialized training in physical medicine and rehabilitation for the following conditions: Daily direction of  a multidisciplinary physical rehabilitation program to ensure safe treatment while eliciting the highest outcome that is of practical value to the patient.: Yes Daily medical management of patient stability for increased activity during participation in an intensive rehabilitation regime.: Yes Daily analysis of laboratory values and/or radiology reports with any subsequent need for medication adjustment of medical intervention for : Post surgical problems;Blood pressure problems;Other   I attest that I was present, lead the team conference, and concur with the assessment and plan of the team.   Ziad Maye 07/01/2018, 6:07 PM

## 2018-07-02 ENCOUNTER — Inpatient Hospital Stay (HOSPITAL_COMMUNITY): Payer: Medicare HMO | Admitting: Physical Therapy

## 2018-07-02 ENCOUNTER — Inpatient Hospital Stay (HOSPITAL_COMMUNITY): Payer: Medicare HMO

## 2018-07-02 LAB — BASIC METABOLIC PANEL
Anion gap: 9 (ref 5–15)
BUN: 14 mg/dL (ref 8–23)
CO2: 29 mmol/L (ref 22–32)
Calcium: 8 mg/dL — ABNORMAL LOW (ref 8.9–10.3)
Chloride: 96 mmol/L — ABNORMAL LOW (ref 98–111)
Creatinine, Ser: 0.54 mg/dL (ref 0.44–1.00)
GFR calc Af Amer: >60 mL/min (ref >60)
GFR calc non Af Amer: >60 mL/min (ref >60)
Glucose, Bld: 114 mg/dL — ABNORMAL HIGH (ref 70–99)
POTASSIUM: 3.8 mmol/L (ref 3.5–5.1)
Sodium: 134 mmol/L — ABNORMAL LOW (ref 135–145)

## 2018-07-02 LAB — GLUCOSE, CAPILLARY
Glucose-Capillary: 122 mg/dL — ABNORMAL HIGH (ref 70–99)
Glucose-Capillary: 90 mg/dL (ref 70–99)
Glucose-Capillary: 258 mg/dL — ABNORMAL HIGH (ref 70–99)
Glucose-Capillary: 84 mg/dL (ref 70–99)

## 2018-07-02 LAB — CBC
HCT: 24.7 % — ABNORMAL LOW (ref 36.0–46.0)
Hemoglobin: 7.5 g/dL — ABNORMAL LOW (ref 12.0–15.0)
MCH: 31.9 pg (ref 26.0–34.0)
MCHC: 30.4 g/dL (ref 30.0–36.0)
MCV: 105.1 fL — AB (ref 80.0–100.0)
PLATELETS: 308 10*3/uL (ref 150–400)
RBC: 2.35 MIL/uL — ABNORMAL LOW (ref 3.87–5.11)
RDW: 19.5 % — ABNORMAL HIGH (ref 11.5–15.5)
WBC: 5.5 10*3/uL (ref 4.0–10.5)
nRBC: 0 % (ref 0.0–0.2)

## 2018-07-02 NOTE — Progress Notes (Signed)
Occupational Therapy Session Note  Patient Details  Name: Kaitlin Vargas MRN: [id number] Date of Birth: 1951-02-09  Today's Date: 07/02/2018 OT Individual Time: 3570-1779 OT Individual Time Calculation (min): 42 min    Short Term Goals: Week 1:  OT Short Term Goal 1 (Week 1): Pt will tolerate standing for 2 mins with min A to increase endurance for BADL tasks OT Short Term Goal 2 (Week 1): Pt will complete 1 step of LB dressing task OT Short Term Goal 3 (Week 1): Pt will complete stand-piovt toilet transfer with mod A OT Short Term Goal 4 (Week 1): Pt will complete sit<>stand with min A  Skilled Therapeutic Interventions/Progress Updates:    1:1. Pt received in w/c with heat pack applied to back d/t chronic unrated pain. Pt educated on reacher, sock aide and shoe funnel with demo and mod instructional cues for techniue. Pt requires min fading to S overall for doffing sock with reacher, S overall for donning socks, and mod VC for use of shoe funnel to don shoes. Pt practices each clothing item 2x for blocked practice. Exited session with pt setaed in w/c, belt alarm on and call light in reach  Therapy Documentation Precautions:  Precautions Precautions: Fall, Back Precaution Comments: back precautions for comfort Restrictions Weight Bearing Restrictions: No General:   Vital Signs: Therapy Vitals Temp: 98 F (36.7 C) Pulse Rate: (!) 103 Resp: 17 BP: 135/79 Patient Position (if appropriate): Sitting Oxygen Therapy SpO2: 98 % O2 Device: Room Air Pain:   ADL: ADL Eating: Set up Grooming: Setup Upper Body Bathing: Minimal assistance Lower Body Bathing: Maximal assistance Upper Body Dressing: Minimal assistance Lower Body Dressing: Dependent Toileting: Not assessed Toilet Transfer: Maximal assistance Toilet Transfer Method: Stand pivot Vision   Perception    Praxis   Exercises:   Other Treatments:     Therapy/Group: Individual Therapy  Tonny Branch 07/02/2018, 3:13 PM

## 2018-07-02 NOTE — Progress Notes (Signed)
[facility] PHYSICAL MEDICINE & REHABILITATION PROGRESS NOTE   Subjective/Complaints: Still complaining of back pain. May be a "little better" with ms contin  ROS: Patient denies fever, rash, sore throat, blurred vision, nausea, vomiting, diarrhea, cough, shortness of breath or chest pain,   headache, or mood change.   Objective:   No results found. Recent Labs    07/02/18 0439  WBC 5.5  HGB 7.5*  HCT 24.7*  PLT 308   Recent Labs    07/02/18 0439  NA 134*  K 3.8  CL 96*  CO2 29  GLUCOSE 114*  BUN 14  CREATININE 0.54  CALCIUM 8.0*    Intake/Output Summary (Last 24 hours) at 07/02/2018 0842 Last data filed at 07/02/2018 0320 Gross per 24 hour  Intake 490 ml  Output 900 ml  Net -410 ml     Physical Exam: Vital Signs Blood pressure (!) 145/67, pulse (!) 107, temperature 98.1 F (36.7 C), temperature source Oral, resp. rate 19, height 4\' 10"  (1.473 m), weight 77.4 kg, SpO2 95 %.  Constitutional: No distress . Vital signs reviewed. HEENT: EOMI, oral membranes moist Neck: supple Cardiovascular: RRR without murmur. No JVD    Respiratory: CTA Bilaterally without wheezes or rales. Normal effort    GI: BS +, non-tender, non-distended  Musc: low back tender as well as sacral/gluteal area--really non-specific Neurological: Alert HOH Motor: Bilateral UE 4/5 proximal to distal.  Bilateral LE: Limited by pain ?2+/5 prox to 3+/5 ADF/PF.  Skin:lesions bilateral lower extremities Right foot ulceration is dressed, wearing shoes already with PT Psych: Normal mood.  Normal behavior.  Assessment/Plan: 1. Functional deficits secondary to Lumbar discitis which require 3+ hours per day of interdisciplinary therapy in a comprehensive inpatient rehab setting.  Physiatrist is providing close team supervision and 24 hour management of active medical problems listed below.  Physiatrist and rehab team continue to assess barriers to discharge/monitor patient progress toward functional  and medical goals  Care Tool:  Bathing    Body parts bathed by patient: Right arm, Chest, Left arm, Abdomen, Face, Right upper leg, Left upper leg, Front perineal area, Right lower leg, Left lower leg   Body parts bathed by helper: Buttocks Body parts n/a: Front perineal area, Buttocks   Bathing assist Assist Level: Moderate Assistance - Patient 50 - 74%     Upper Body Dressing/Undressing Upper body dressing   What is the patient wearing?: Dress    Upper body assist Assist Level: Minimal Assistance - Patient > 75%    Lower Body Dressing/Undressing Lower body dressing      What is the patient wearing?: Incontinence brief     Lower body assist Assist for lower body dressing: Total Assistance - Patient < 25%     Toileting Toileting Toileting Activity did not occur Landscape architect and hygiene only): Refused  Toileting assist Assist for toileting: Maximal Assistance - Patient 25 - 49%     Transfers Chair/bed transfer  Transfers assist  Chair/bed transfer activity did not occur: Safety/medical concerns  Chair/bed transfer assist level: Minimal Assistance - Patient > 75%     Locomotion Ambulation   Ambulation assist      Assist level: Minimal Assistance - Patient > 75% Assistive device: Walker-rolling Max distance: 40'   Walk 10 feet activity   Assist  Walk 10 feet activity did not occur: Safety/medical concerns  Assist level: Minimal Assistance - Patient > 75% Assistive device: Walker-rolling   Walk 50 feet activity   Assist Walk 50 feet  with 2 turns activity did not occur: Safety/medical concerns         Walk 150 feet activity   Assist Walk 150 feet activity did not occur: Safety/medical concerns         Walk 10 feet on uneven surface  activity   Assist Walk 10 feet on uneven surfaces activity did not occur: Safety/medical concerns         Wheelchair     Assist Will patient use wheelchair at discharge?: No              Wheelchair 50 feet with 2 turns activity    Assist            Wheelchair 150 feet activity     Assist          Medical Problem List and Plan: 1.Decreased functional mobilitysecondary to L1-2 discitis/ osteomyelitis as well as right foot ulcerationcomplicated by cardiac arrest  -Continue CIR therapies including PT, OT   -pain still a limiting factor. Seems to be trying to work through it 2. DVT Prophylaxis/Anticoagulation: Subcutaneous heparin.     Vascular studies limited, but negative for DVT 3. Pain Management:Hydrocodone,Robaxin as needed  -heat, ice discussed as well.   -added low dose ms contin 15mg  q12  -has had sacral/gluteal pain also--pain more back today   -consider CT of back/pelvis   -continue to work on pain control  .    -wbc's 5.5 4. Mood:Provide emotional support 5. Neuropsych: This patientiscapable of making decisions on herown behalf. 6. Skin/Wound Care/right foot wound ulceration:  -WOC RN, appreciate wound care recs  - consider collagenase or wet to dry to foot wund 7. Fluids/Electrolytes/Nutrition: 8. MSSA bacteremia.IV Ancef until 08/13/2018. Follow-up per infectious disease 9. Acute on chronic anemia.   -diet/fe+ supp  -Hgb 7.9 on 2/7----down to 7.5 2/12---no signs of gross blood loss  -recheck Friday 10. Diabetes mellitus with peripheral neuropathy. Hemoglobin A1c 8.7. Lantus insulin 25 units daily.    Improving control 2/12 11. Hypertension. Lisinopril 10 mg daily,Lasix 40 mg daily,Lopressor 25 mg twice a day.   Borderline to improved 2/12, pain factor 12. B12 deficiency.Continue vitamin B12 13.  Hyponatremia  Sodium 134 on 2/12  Continue to monitor    LOS: 6 days A FACE TO FACE EVALUATION WAS PERFORMED  Meredith Staggers 07/02/2018, 8:42 AM

## 2018-07-02 NOTE — Plan of Care (Signed)
  Problem: Consults Goal: RH GENERAL PATIENT EDUCATION Description See Patient Education module for education specifics. Pt will identify 2 goals to follow for her diabetes management.  Outcome: Progressing Goal: Skin Care Protocol Initiated - if Braden Score 18 or less Description Pt will maintain skin without breakdown to buttocks/peri area from pressure or MASD  Outcome: Progressing Goal: Nutrition Consult-if indicated Description Pt to have consult with diabetes educator if desires  Outcome: Progressing Goal: Diabetes Guidelines if Diabetic/Glucose > 140 Description If diabetic or lab glucose is > 140 mg/dl - Initiate Diabetes/Hyperglycemia Guidelines & Document Interventions    Outcome: Progressing   Problem: RH BOWEL ELIMINATION Goal: RH STG MANAGE BOWEL WITH ASSISTANCE Description STG Manage Bowel with min Assistance.  Outcome: Progressing Goal: RH STG MANAGE BOWEL W/MEDICATION W/ASSISTANCE Description STG Manage Bowel with Medication with min Assistance.  Outcome: Progressing   Problem: RH BLADDER ELIMINATION Goal: RH STG MANAGE BLADDER WITH ASSISTANCE Description STG Manage Bladder With min Assistance  Outcome: Progressing   Problem: RH SKIN INTEGRITY Goal: RH STG SKIN FREE OF INFECTION/BREAKDOWN Description No breakdown due to pressure or MASD  Outcome: Progressing Goal: RH STG MAINTAIN SKIN INTEGRITY WITH ASSISTANCE Description STG Maintain Skin Integrity With min Assistance.  Outcome: Progressing Goal: RH STG ABLE TO PERFORM INCISION/WOUND CARE W/ASSISTANCE Description STG Able To Perform Incision/Wound Care With min  Assistance. Right foot diabetic ulcer  Outcome: Progressing   Problem: RH SAFETY Goal: RH STG DECREASED RISK OF FALL WITH ASSISTANCE Description STG Decreased Risk of Fall With min Assistance.  Outcome: Progressing   Problem: RH PAIN MANAGEMENT Goal: RH STG PAIN MANAGED AT OR BELOW PT'S PAIN GOAL Description Pain <4  Outcome:  Progressing   Problem: RH KNOWLEDGE DEFICIT GENERAL Goal: RH STG INCREASE KNOWLEDGE OF SELF CARE AFTER HOSPITALIZATION Description Pt will have dates of follow up appointments.  Outcome: Progressing

## 2018-07-02 NOTE — Progress Notes (Signed)
Physical Therapy Session Note  Patient Details  Name: Don Giarrusso MRN: [id number] Date of Birth: 31-Aug-1950  Today's Date: 07/02/2018 PT Individual Time: 0805-0915 PT Individual Time Calculation (min): 70 min   Short Term Goals: Week 1:  PT Short Term Goal 1 (Week 1): pt will perform functional transfers with min A PT Short Term Goal 2 (Week 1): pt will perform gait x 15' with min A in controlled environment  Skilled Therapeutic Interventions/Progress Updates:  Session 1 Pt received sitting EOB and agreeable to PT. PT instructed pt in siting balance EOB while PT donned Ted hose. Stand pivot transfer to [facility] with min assist from PT. Sit<>stand from [facility] with min assist for PT to don pants. Pt transported to rehab gym. Gait training with RW x 8f with min assist and increased time due to pain; min cues for posture, AD management and stp width. PT isntructed pt in seated therex with level 2 tband: LAQ, hip abduction, hip flexion, Knee flexion, ankle PF, hip extension. Each completed 10-12 BLE with cues for full ROM, improved hold at end range and proper speed. Pt also completed partial tricep press-up x 10 from arm rest on WC. Pt required multiple prolonged therapeutic rest breaks due to pain and fatigue throughout treatment. All sit<>stand throughout treatment completed with min assist from PT, increased time, and cues for proper UE placement. Toilet transfer with min assist from PT. Peri care completed by pt with CGA, while clothing management performed by PT. Patient returned to [facility] and [facility] and all needs met.     Session 2.  Pt received sitting in WC and agreeable to PT. Pt reports need for possible BM. Ambulatory transfer x 15fto toilet with RW and min assist from PT. Pt able to urinate, but unsuccessful BM. Peri care completed by Pt and clothing management performed by PT in standing with UE support on RW. WC mobility training instructed by PT with  supervision assist [id number] min cues for turns, doorway management and posture. Patient returned to room and [facility] in reach and all needs met.          Therapy Documentation Precautions:  Precautions Precautions: Fall, Back Precaution Comments: back precautions for comfort Restrictions Weight Bearing Restrictions: No   Pain: Pain Assessment Pain Scale: 0-10 Pain Score: 5  Pain Type: Chronic pain Pain Location: Back Pain Orientation: Mid;Lower Pain Descriptors / Indicators: Aching;Constant Pain Onset: On-going Patients Stated Pain Goal: 3 Pain Intervention(s): Repositioned   Therapy/Group: Individual Therapy  AustLorie Phenix2/2020, 9:21 AM

## 2018-07-02 NOTE — Progress Notes (Signed)
Occupational Therapy Session Note  Patient Details  Name: Kaitlin Vargas MRN: [id number] Date of Birth: [birth year]  Today's Date: 07/02/2018 OT Individual Time: 6701-4103 OT Individual Time Calculation (min): 57 min    Short Term Goals: Week 1:  OT Short Term Goal 1 (Week 1): Pt will tolerate standing for 2 mins with min A to increase endurance for BADL tasks OT Short Term Goal 2 (Week 1): Pt will complete 1 step of LB dressing task OT Short Term Goal 3 (Week 1): Pt will complete stand-piovt toilet transfer with mod A OT Short Term Goal 4 (Week 1): Pt will complete sit<>stand with min A  Skilled Therapeutic Interventions/Progress Updates:    1;1. Pt received in w/c, with pain reported but not rated in back. Heating pad utilized thourhgout session for pain management. Pt completes UB doffing/donning clothing and bathing with S overall. Pt washes hair in shower cap for Bue strengthening. Pt requesting to work on arm exercises. Pt completes 1x15 2# dowel rod BUE exercises for BUE strengthening and endurance required for BADL: shoulder flex/ext, elbow flex/ext, horizontal ab/adduct, wrist flex/ext, chest press and beach ball volley. Exited session with pt seated in w/c, call light in reach and all needs met  Therapy Documentation Precautions:  Precautions Precautions: Fall, Back Precaution Comments: back precautions for comfort Restrictions Weight Bearing Restrictions: No General:   Vital Signs:  Therapy/Group: Individual Therapy  Tonny Branch 07/02/2018, 11:13 AM

## 2018-07-03 ENCOUNTER — Inpatient Hospital Stay (HOSPITAL_COMMUNITY): Payer: Medicare HMO | Admitting: Occupational Therapy

## 2018-07-03 ENCOUNTER — Inpatient Hospital Stay (HOSPITAL_COMMUNITY): Payer: Medicare HMO

## 2018-07-03 ENCOUNTER — Inpatient Hospital Stay (HOSPITAL_COMMUNITY): Payer: Medicare HMO | Admitting: Physical Therapy

## 2018-07-03 LAB — GLUCOSE, CAPILLARY
Glucose-Capillary: 83 mg/dL (ref 70–99)
Glucose-Capillary: 175 mg/dL — ABNORMAL HIGH (ref 70–99)
Glucose-Capillary: 159 mg/dL — ABNORMAL HIGH (ref 70–99)
Glucose-Capillary: 60 mg/dL — ABNORMAL LOW (ref 70–99)
Glucose-Capillary: 87 mg/dL (ref 70–99)
Glucose-Capillary: 105 mg/dL — ABNORMAL HIGH (ref 70–99)

## 2018-07-03 LAB — OCCULT BLOOD X 1 CARD TO LAB, STOOL: FECAL OCCULT BLD: NEGATIVE

## 2018-07-03 NOTE — Progress Notes (Signed)
Occupational Therapy Session Note  Patient Details  Name: Kaitlin Vargas MRN: [id number] Date of Birth: May 19, 1951  Today's Date: 07/03/2018 OT Individual Time: 1000-1100 OT Individual Time Calculation (min): 60 min   Short Term Goals: Week 1:  OT Short Term Goal 1 (Week 1): Pt will tolerate standing for 2 mins with min A to increase endurance for BADL tasks OT Short Term Goal 2 (Week 1): Pt will complete 1 step of LB dressing task OT Short Term Goal 3 (Week 1): Pt will complete stand-piovt toilet transfer with mod A OT Short Term Goal 4 (Week 1): Pt will complete sit<>stand with min A  Skilled Therapeutic Interventions/Progress Updates:    Pt greeted sitting in wc and agreeable to OT treatment session. Pt reports need to go to the bathroom. Stand-pivot to commode with grab bars and mod A. Assistance to [place]. Pt with very small BM and voided bladder successfully. Pt needed assistance for 3/3 toileting tasks. Pt needed max A and multiple trials to then stand from commode 2/2 fatigue. Mod A to pivot. Bathing/dressing completed wc at the sink. Pt able to thread R LE into pant leg today using reacher and min A to thread LLE using reacher. Mod A sit<>stand 2/2 fatigue and assistance to pull pants over hips. Nursing entered to assess R foot wound. Pt left seated in wc with safety belt on and nursing present.   Therapy Documentation Precautions:  Precautions Precautions: Fall, Back Precaution Comments: back precautions for comfort Restrictions Weight Bearing Restrictions: No Pain: Pain Assessment Pain Scale: 0-10 Pain Score: 5  Pain Type: Chronic pain Pain Location: Back Pain Orientation: Lower Pain Frequency: Other (Comment)(when pt moves ) Pain Onset: With Activity Pain Intervention(s): Repositioned  Therapy/Group: Individual Therapy  Valma Cava 07/03/2018, 11:01 AM

## 2018-07-03 NOTE — Progress Notes (Signed)
Social Work Patient ID: Kaitlin Vargas, female   DOB: July 13, 1950, 68 y.o.   MRN: [id number]   Have reviewed the team conference with pt and spouse.  Both aware and agreeable with targeted d/c date of 07/15/18.  Aware that she will d/c with home abx set up via [place].  Continue to follow.  Kambryn Dapolito, LCSW

## 2018-07-03 NOTE — Consult Note (Addendum)
[facility] Nurse wound follow up Previous WOC consult was performed on 2/7; right foot wound has greatly improved since that time. 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 Patient states she has seen a podiatrist in the distant past for this problem. Wound type: Right foot with healing wound; no further depth or callous, dry peeling yellow skin lifted and removed easily, revealing partial thickness wound .2X.2X.1cm, pink and dry, surrounded by dark reddish-purple intact skin.  No odor or drainage.   Dressing procedure/placement/frequency: Discontinue use of xeroform; foam dressing applied to protect from further injury.  Discussed plan of care with patient. Please re-consult if further assistance is needed.  Thank-you,  Julien Girt MSN, [place], [place], [place], [place]

## 2018-07-03 NOTE — Progress Notes (Signed)
Occupational Therapy Session Note  Patient Details  Name: Kaitlin Vargas MRN: [id number] Date of Birth: 02-Sep-1950  Today's Date: 07/03/2018 OT Individual [id number]      Short Term Goals: Week 1:  OT Short Term Goal 1 (Week 1): Pt will tolerate standing for 2 mins with min A to increase endurance for BADL tasks OT Short Term Goal 2 (Week 1): Pt will complete 1 step of LB dressing task OT Short Term Goal 3 (Week 1): Pt will complete stand-piovt toilet transfer with mod A OT Short Term Goal 4 (Week 1): Pt will complete sit<>stand with min A  Skilled Therapeutic Interventions/Progress Updates: Upon approach for therapy patient was receiving nursing care, and after then after patient started this therapy session, she stated she needed pain meds.   Nurse and nursing student came back in to attend to patient complaint of pain.     Focus of this session was to work on review of BAT back precautions (patient required demonstrative and verbal reminders in order to recall the 3 precautions), to work on toileting, toileting transfers and standing tolerance in order to reach goals and help patient in her goal of return to independence with self care and moving around her home.  Patient participation as follows:    -required demonstrative and verbal cues to recall all 3 BAT back precautions -sit to stand=  Moderate assistance and extra time to due patient complaints of pain with all movements and moved slowly guarding for anticipated pain (she stated she has pain with all of her body movements and when her wheel chair first moves into a different direction) -toilet transfer= extra time and moderate assistance -toileting (patient asked to sit for quite some time to try to have BM)= total assistance to pull up/down pants, brief, and to complete buttock cleansing (she had a smear of BM after she expelled gas). Patient and dtr were verbally educated on toilet aide and spaghetti ladle wrapped with tissue to  improve patient reach for wiping buttocks - in order to decrease chance that patient may twist while completing this portion of the toileting task  Toilet transfer to/fr w/c and drop arm 3:1 via grab bar=moderate assistance and extra time for patient to steady balance and work through pain  W/c self propulsion=  Patient required total assist to wheel chair to/ from her room to the dayroom to "check out the [place] party."  Patient was returned to room and left seated in her w/c with call bell within reach and chair alarm engaged.    A specialty wound nurse came in behind this clinician and stated she was preparing to take a look at Kaitlin Vargas's right foot wound.  Continue OT plan of care     Therapy Documentation Precautions:  Precautions Precautions: Fall, Back Precaution Comments: back precautions for comfort Restrictions Weight Bearing Restrictions: No Pain:7/10 "The lower part of my back. ..  Just hurting."  Therapy/Group: Individual Therapy  Alfredia Ferguson [facility] 07/03/2018, 7:58 PM

## 2018-07-03 NOTE — Progress Notes (Signed)
Physical Therapy Session Note  Patient Details  Name: Kaitlin Vargas MRN: [id number] Date of Birth: 13-Aug-1950  Today's Date: 07/03/2018 PT Individual Time: 0825-0920 PT Individual Time Calculation (min): 55 min   Short Term Goals: Week 1:  PT Short Term Goal 1 (Week 1): pt will perform functional transfers with min A PT Short Term Goal 2 (Week 1): pt will perform gait x 15' with min A in controlled environment  Skilled Therapeutic Interventions/Progress Updates:    pt rec'd in bed, complaining of pain but willing to participate. Pain meds given at start of session.  Pt performs bed mobility with min A, mod cuing for log roll technique.  Total A to don pants, min A sit <> stand and stand pivot transfers with RW throughout session.  Gait with RW 35' x 2, 52' with CGA.  Seated therex 2 x 12 hip flex, LAQ, ankle pumps all with frequent rest breaks.  Step ups to 4'' step with RW and mod/max A. Pt able to step up with Lt LE, unable to step up with Rt LE due to weakness.  Tap ups to 4'' step with min A for balance, increased wt shift needed to compensate for Rt LE weakness. Pt left in room with alarm set, needs at hand, heat applied to back.  Therapy Documentation Precautions:  Precautions Precautions: Fall, Back Precaution Comments: back precautions for comfort Restrictions Weight Bearing Restrictions: No Pain: Pain Assessment Pain Scale: 0-10 Pain Score: 8  Pain Type: Chronic pain Pain Location: Back Pain Orientation: Lower Pain Frequency: Other (Comment)(when pt moves ) Pain Onset: With Activity Pain Intervention(s): RN made aware and meds given prior to session, heat applied after session   Therapy/Group: Individual Therapy  DONAWERTH,KAREN 07/03/2018, 9:24 AM

## 2018-07-03 NOTE — Progress Notes (Signed)
Physical Therapy Session Note  Patient Details  Name: Kaitlin Vargas MRN: [id number] Date of Birth: 1951-03-01  Today's Date: 07/03/2018 PT Individual Time: 1135-1210 PT Individual Time Calculation (min): 35 min   Short Term Goals: Week 1:  PT Short Term Goal 1 (Week 1): pt will perform functional transfers with min A PT Short Term Goal 2 (Week 1): pt will perform gait x 15' with min A in controlled environment      Skilled Therapeutic Interventions/Progress Updates:  Pt sitting up in w/c, eating her lunch.  Pt c/o of a "catch " in her low back, when she reached forward,  but did not rate pain.  She stated that K pad was helpful.  Pt was scooted forward in w/c in order to reach tray, with no back support.  PT educated pt on keeping K pad on her back with the use of a pillow behind her back, and bringing moveable part of table closer to her.  Pt's bil LEs noted to be edematous ; PT donned TEDS.  Seated strengthening: 4 x 1 bil glut sets with poor activation R and L (palpated by PT with hand under pt's bottom).  Pt stated that she thought she needed to have a BM.   Toilet transfer using RW with min assist, BSC over toilet.  Pt noted to be using Valsalva maneuver in order to have BM.  PT educated pt on avoiding that , and to exhale as she tried to have BM, with fair carry-over.  PT called Elmo Putt, NT to supervision pt at end of session.  Pt left sitting on toilet with Elmo Putt attending her.   Therapy Documentation Precautions:  Precautions Precautions: Fall, Back Precaution Comments: back precautions for comfort Restrictions Weight Bearing Restrictions: No      Therapy/Group: Individual Therapy  [name] 07/03/2018, 12:17 PM

## 2018-07-03 NOTE — Progress Notes (Addendum)
[facility] PHYSICAL MEDICINE & REHABILITATION PROGRESS NOTE   Subjective/Complaints: Ongoing pain, working through it. Left knee/lower quad.   ROS: Patient denies fever, rash, sore throat, blurred vision, nausea, vomiting, diarrhea, cough, shortness of breath or chest pain, joint or back pain, headache, or mood change.   Objective:   No results found. Recent Labs    07/02/18 0439  WBC 5.5  HGB 7.5*  HCT 24.7*  PLT 308   Recent Labs    07/02/18 0439  NA 134*  K 3.8  CL 96*  CO2 29  GLUCOSE 114*  BUN 14  CREATININE 0.54  CALCIUM 8.0*    Intake/Output Summary (Last 24 hours) at 07/03/2018 0848 Last data filed at 07/03/2018 1655 Gross per 24 hour  Intake 780 ml  Output 550 ml  Net 230 ml     Physical Exam: Vital Signs Blood pressure (!) 133/59, pulse (!) 102, temperature 98.4 F (36.9 C), temperature source Oral, resp. rate 19, height 4\' 10"  (1.473 m), weight 77.4 kg, SpO2 96 %.  Constitutional: No distress . Vital signs reviewed. HEENT: EOMI, oral membranes moist Neck: supple Cardiovascular: RRR without murmur. No JVD    Respiratory: CTA Bilaterally without wheezes or rales. Normal effort    GI: BS +, non-tender, non-distended  Musc: low back pain, left knee tender with ROM,palpation---no warmth or erythema Neurological: Alert HOH Motor: Bilateral UE 4/5 proximal to distal.  Bilateral LE: Limited by pain still 2 to 2+/5 prox to 3+/5 ADF/PF.  Skin:lesions bilateral lower extremities Right foot ulceration is dressed, wearing shoes already with PT Psych: Normal mood.  Normal behavior.  Assessment/Plan: 1. Functional deficits secondary to Lumbar discitis which require 3+ hours per day of interdisciplinary therapy in a comprehensive inpatient rehab setting.  Physiatrist is providing close team supervision and 24 hour management of active medical problems listed below.  Physiatrist and rehab team continue to assess barriers to discharge/monitor patient progress  toward functional and medical goals  Care Tool:  Bathing    Body parts bathed by patient: Right arm, Chest, Left arm, Abdomen, Face, Right upper leg, Left upper leg, Front perineal area, Right lower leg, Left lower leg   Body parts bathed by helper: Buttocks Body parts n/a: Front perineal area, Buttocks   Bathing assist Assist Level: Moderate Assistance - Patient 50 - 74%     Upper Body Dressing/Undressing Upper body dressing   What is the patient wearing?: Dress    Upper body assist Assist Level: Minimal Assistance - Patient > 75%    Lower Body Dressing/Undressing Lower body dressing      What is the patient wearing?: Incontinence brief     Lower body assist Assist for lower body dressing: Total Assistance - Patient < 25%     Toileting Toileting Toileting Activity did not occur Landscape architect and hygiene only): Refused  Toileting assist Assist for toileting: Maximal Assistance - Patient 25 - 49%     Transfers Chair/bed transfer  Transfers assist  Chair/bed transfer activity did not occur: Safety/medical concerns  Chair/bed transfer assist level: Minimal Assistance - Patient > 75%     Locomotion Ambulation   Ambulation assist      Assist level: Minimal Assistance - Patient > 75% Assistive device: Walker-rolling Max distance: 50   Walk 10 feet activity   Assist  Walk 10 feet activity did not occur: Safety/medical concerns  Assist level: Minimal Assistance - Patient > 75% Assistive device: Walker-rolling   Walk 50 feet activity   Assist  Walk 50 feet with 2 turns activity did not occur: Safety/medical concerns  Assist level: Minimal Assistance - Patient > 75%      Walk 150 feet activity   Assist Walk 150 feet activity did not occur: Safety/medical concerns         Walk 10 feet on uneven surface  activity   Assist Walk 10 feet on uneven surfaces activity did not occur: Safety/medical concerns          Wheelchair     Assist Will patient use wheelchair at discharge?: No      Wheelchair assist level: Supervision/Verbal cueing Max wheelchair distance: 150    Wheelchair 50 feet with 2 turns activity    Assist        Assist Level: Supervision/Verbal cueing   Wheelchair 150 feet activity     Assist     Assist Level: Supervision/Verbal cueing    Medical Problem List and Plan: 1.Decreased functional mobilitysecondary to L1-2 discitis/ osteomyelitis as well as right foot ulcerationcomplicated by cardiac arrest  -Continue CIR therapies including PT, OT   -pain remains a limiting factor. Seems to be trying to work through it 2. DVT Prophylaxis/Anticoagulation: Subcutaneous heparin.     Vascular studies limited, but negative for DVT 3. Pain Management:Hydrocodone,Robaxin as needed  -heat, ice discussed as well. Schedule ice to left knee, back  -added low dose ms contin 15mg  q12 4. Mood:Provide emotional support 5. Neuropsych: This patientiscapable of making decisions on herown behalf. 6. Skin/Wound Care/right foot wound ulceration:  -WOC RN, appreciate wound care recs  - examine wound today when foot is accessible. Consider change to collagenase or wet to dry dressing 7. Fluids/Electrolytes/Nutrition: 8. MSSA bacteremia.IV Ancef until 08/13/2018. Follow-up per infectious disease 9. Acute on chronic anemia.   -diet/fe+ supp  -Hgb 7.9 on 2/7----down to 7.5 2/12---no signs of gross blood loss  -recheck Friday  -check stool for OB 10. Diabetes mellitus with peripheral neuropathy. Hemoglobin A1c 8.7. Lantus insulin 25 units daily.    Improving but inconsistent control 2/13--continue to observe. 11. Hypertension. Lisinopril 10 mg daily,Lasix 40 mg daily,Lopressor 25 mg twice a day.   Borderline to improved 2/12, pain factor 12. B12 deficiency.Continue vitamin B12 13.  Hyponatremia  Sodium 134 on 2/12  Continue to monitor    LOS: 7 days A FACE TO  FACE EVALUATION WAS PERFORMED  Meredith Staggers 07/03/2018, 8:48 AM

## 2018-07-04 ENCOUNTER — Inpatient Hospital Stay (HOSPITAL_COMMUNITY): Payer: Medicare HMO | Admitting: Physical Therapy

## 2018-07-04 ENCOUNTER — Inpatient Hospital Stay (HOSPITAL_COMMUNITY): Payer: Medicare HMO | Admitting: Occupational Therapy

## 2018-07-04 LAB — CBC
HCT: 24.4 % — ABNORMAL LOW (ref 36.0–46.0)
Hemoglobin: 7.2 g/dL — ABNORMAL LOW (ref 12.0–15.0)
MCH: 31.4 pg (ref 26.0–34.0)
MCHC: 29.5 g/dL — ABNORMAL LOW (ref 30.0–36.0)
MCV: 106.6 fL — ABNORMAL HIGH (ref 80.0–100.0)
Platelets: 283 10*3/uL (ref 150–400)
RBC: 2.29 MIL/uL — ABNORMAL LOW (ref 3.87–5.11)
RDW: 19.4 % — ABNORMAL HIGH (ref 11.5–15.5)
WBC: 5.3 10*3/uL (ref 4.0–10.5)
nRBC: 0 % (ref 0.0–0.2)

## 2018-07-04 LAB — PREPARE RBC (CROSSMATCH)

## 2018-07-04 LAB — GLUCOSE, CAPILLARY
Glucose-Capillary: 79 mg/dL (ref 70–99)
Glucose-Capillary: 246 mg/dL — ABNORMAL HIGH (ref 70–99)
Glucose-Capillary: 118 mg/dL — ABNORMAL HIGH (ref 70–99)
Glucose-Capillary: 67 mg/dL — ABNORMAL LOW (ref 70–99)
Glucose-Capillary: 92 mg/dL (ref 70–99)

## 2018-07-04 LAB — ABO/RH: ABO/RH(D): B POS

## 2018-07-04 MED ORDER — SODIUM CHLORIDE 0.9% IV SOLUTION
Freq: Once | INTRAVENOUS | Status: AC
  Administered 2018-07-04: 15:00:00 via INTRAVENOUS

## 2018-07-04 NOTE — Progress Notes (Signed)
Occupational Therapy Weekly Progress Note  Patient Details  Name: Kaitlin Vargas MRN: [id number] Date of Birth: 01/28/1951  Beginning of progress report period: June 27, 2018 End of progress report period: July 04, 2018  Today's Date: 07/04/2018 OT Individual Time: 1335-1415 OT Individual Time Calculation (min): 40 min  and Today's Date: 07/04/2018 OT Missed Time: 35 Minutes Missed Time Reason: Pain;Patient fatigue   Patient has met 2 of 4 short term goals.  Pt has been making slow but steady progress towards OT goals, until today when she was limited by pain and fatigue w/ low H&H. Pt has been able to stand with as little as min A, but occasionally needs Mod A pending fatigue level. LB ADLs continue to be challenging 2/2 pain. We are working on use of LH equipment to increase independence. Continue with current POC.  Patient continues to demonstrate the following deficits: muscle weakness and decreased sitting balance, decreased standing balance, decreased postural control, decreased balance strategies and difficulty maintaining precautions and therefore will continue to benefit from skilled OT intervention to enhance overall performance with BADL and Reduce care partner burden.  Patient progressing toward long term goals..  Continue plan of care.  OT Short Term Goals Week 1:  OT Short Term Goal 1 (Week 1): Pt will tolerate standing for 2 mins with min A to increase endurance for BADL tasks OT Short Term Goal 1 - Progress (Week 1): Progressing toward goal OT Short Term Goal 2 (Week 1): Pt will complete 1 step of LB dressing task OT Short Term Goal 2 - Progress (Week 1): Met OT Short Term Goal 3 (Week 1): Pt will complete stand-piovt toilet transfer with mod A OT Short Term Goal 3 - Progress (Week 1): Met OT Short Term Goal 4 (Week 1): Pt will complete sit<>stand with min A OT Short Term Goal 4 - Progress (Week 1): Progressing toward goal Week 2:  OT Short Term Goal 1 (Week 2): Pt will  tolerate standing for 2 mins with min A to increase endurance for BADL tasks OT Short Term Goal 2 (Week 2): Pt will complete sit<>stand with consistent min A OT Short Term Goal 3 (Week 2): Pt will be able to thread B LEs into pant legs using ADL AE and min verbal cues  Skilled Therapeutic Interventions/Progress Updates:    Pt greeted sitting in wc, requesting to try to go to the bathroom. Pt brought into bathroom via wc and was able to complete stand-pivot to commode with mod A using grab bars. OT assisted with clothing management. Pt able to void bladder and had successful BM. Pt able to complete peri-care in the front, but needed total A for hygiene s/p BM. Max A for clothing management as well. Mod A to pivot back to wc. Grooming tasks completed sitting in wc at the sink including hand washing and face washing. Pt declined any further activity 2.2 pain and fatigue. Heat pack applied to back and safety alarm belt donned. Pt left with needs met and spouse present.   Therapy Documentation Precautions:  Precautions Precautions: Fall, Back Precaution Comments: back precautions for comfort Restrictions Weight Bearing Restrictions: No General: General OT Amount of Missed Time: 35 Minutes Pain: Pain Assessment Pain Scale: 0-10 Pain Score: 9  Pain Type: Chronic pain Pain Location: Back Pain Orientation: Lower Pain Descriptors / Indicators: Aching Pain Frequency: Constant Pain Onset: On-going Patients Stated Pain Goal: 4 Pain Intervention(s): Repositioned, rest, heat applied   Therapy/Group: Individual Therapy   S    07/04/2018, 2:28 PM  

## 2018-07-04 NOTE — Progress Notes (Signed)
Physical Therapy Session Note  Patient Details  Name: Kaitlin Vargas MRN: [id number] Date of Birth: 03-27-1951  Today's Date: 07/04/2018 PT Individual Time: 1532-1558 PT Individual Time Calculation (min): 26 min   Short Term Goals: Week 1:  PT Short Term Goal 1 (Week 1): pt will perform functional transfers with min A PT Short Term Goal 2 (Week 1): pt will perform gait x 15' with min A in controlled environment  Skilled Therapeutic Interventions/Progress Updates:    Patient seated in w/c in room after having started blood transfusion.  Patient agreeable to attempt some in room activities to make up time missed earlier due to fatigue and pain.  Patient requesting to toilet.  Sit to stand and stand to sit with lifting/lowering help max A.  Ambulated 15' x 2 with RW and mod A due to LE weakness/shaking throughout.  Patient able to perform toilet hygiene, but assist with clothing management prior to and after toileting.  Patient seated in w/c for LE therex as noted below.  Left in w/c with belt alarm and call bell and needs in reach.  Therapy Documentation Precautions:  Precautions Precautions: Fall, Back Precaution Comments: back precautions for comfort Restrictions Weight Bearing Restrictions: No Pain: Pain Assessment Pain Scale: 0-10 Pain Score: 7  Faces Pain Scale: Hurts even more Pain Type: Chronic pain Pain Location: Back Pain Orientation: Lower Pain Descriptors / Indicators: Aching Pain Frequency: Constant Pain Onset: On-going Patients Stated Pain Goal: 4 Pain Intervention(s): Heat applied;Ambulation/increased activity    Exercises: General Exercises - Lower Extremity Long Arc Quad: AROM;Both;15 reps;Seated Hip ABduction/ADduction: 10 reps;Strengthening;Seated(theraband) Hip Flexion/Marching: Strengthening;15 reps;Seated;Both Toe Raises: Strengthening;Both;15 reps;Seated Other Treatments:      Therapy/Group: Individual Therapy  Reginia Naas  [place],  [place] 07/04/2018  07/04/2018, 4:07 PM

## 2018-07-04 NOTE — Progress Notes (Signed)
[facility] PHYSICAL MEDICINE & REHABILITATION PROGRESS NOTE   Subjective/Complaints: Patient seen laying in bed this morning.  She states she slept well overnight.  She offers little engagement.  ROS: Denies CP, SOB, N/V/D  Objective:   No results found. Recent Labs    07/02/18 0439 07/04/18 0432  WBC 5.5 5.3  HGB 7.5* 7.2*  HCT 24.7* 24.4*  PLT 308 283   Recent Labs    07/02/18 0439  NA 134*  K 3.8  CL 96*  CO2 29  GLUCOSE 114*  BUN 14  CREATININE 0.54  CALCIUM 8.0*    Intake/Output Summary (Last 24 hours) at 07/04/2018 0935 Last data filed at 07/04/2018 0817 Gross per 24 hour  Intake 720 ml  Output 600 ml  Net 120 ml     Physical Exam: Vital Signs Blood pressure 128/68, pulse 96, temperature 98.5 F (36.9 C), temperature source Oral, resp. rate 16, height 4\' 10"  (1.473 m), weight 77.4 kg, SpO2 98 %.  Constitutional: No distress . Vital signs reviewed. HENT: Normocephalic.  Atraumatic. Eyes: EOMI. No discharge. Cardiovascular: RRR. No JVD. Respiratory: CTA Bilaterally. Normal effort. GI: BS +. Non-distended. Musc: No edema or tenderness in extremities. Musc: TTP left lower extremity Neurological: Alert HOH Motor: Bilateral UE 4/5 proximal to distal.  Bilateral LE: Limited by pain and effort ?2/5 proximally, 3+/5 ADF.  Skin:lesions bilateral lower extremities Right foot ulceration with dressing C/D/I Psych: Normal mood.  Normal behavior.  Assessment/Plan: 1. Functional deficits secondary to Lumbar discitis which require 3+ hours per day of interdisciplinary therapy in a comprehensive inpatient rehab setting.  Physiatrist is providing close team supervision and 24 hour management of active medical problems listed below.  Physiatrist and rehab team continue to assess barriers to discharge/monitor patient progress toward functional and medical goals  Care Tool:  Bathing    Body parts bathed by patient: Right arm, Chest, Left arm, Abdomen, Face,  Right upper leg, Left upper leg, Front perineal area, Right lower leg, Left lower leg   Body parts bathed by helper: Buttocks Body parts n/a: Front perineal area, Buttocks   Bathing assist Assist Level: Moderate Assistance - Patient 50 - 74%     Upper Body Dressing/Undressing Upper body dressing   What is the patient wearing?: Dress    Upper body assist Assist Level: Minimal Assistance - Patient > 75%    Lower Body Dressing/Undressing Lower body dressing      What is the patient wearing?: Incontinence brief     Lower body assist Assist for lower body dressing: Total Assistance - Patient < 25%     Toileting Toileting Toileting Activity did not occur Landscape architect and hygiene only): Refused  Toileting assist Assist for toileting: Total Assistance - Patient < 25%     Transfers Chair/bed transfer  Transfers assist  Chair/bed transfer activity did not occur: Safety/medical concerns  Chair/bed transfer assist level: Minimal Assistance - Patient > 75%     Locomotion Ambulation   Ambulation assist      Assist level: Minimal Assistance - Patient > 75% Assistive device: Walker-rolling Max distance: 50   Walk 10 feet activity   Assist  Walk 10 feet activity did not occur: Safety/medical concerns  Assist level: Minimal Assistance - Patient > 75% Assistive device: Walker-rolling   Walk 50 feet activity   Assist Walk 50 feet with 2 turns activity did not occur: Safety/medical concerns  Assist level: Minimal Assistance - Patient > 75%      Walk 150 feet  activity   Assist Walk 150 feet activity did not occur: Safety/medical concerns         Walk 10 feet on uneven surface  activity   Assist Walk 10 feet on uneven surfaces activity did not occur: Safety/medical concerns         Wheelchair     Assist Will patient use wheelchair at discharge?: No      Wheelchair assist level: Supervision/Verbal cueing Max wheelchair distance: 150     Wheelchair 50 feet with 2 turns activity    Assist        Assist Level: Supervision/Verbal cueing   Wheelchair 150 feet activity     Assist     Assist Level: Supervision/Verbal cueing    Medical Problem List and Plan: 1.Decreased functional mobilitysecondary to L1-2 discitis/ osteomyelitis as well as right foot ulcerationcomplicated by cardiac arrest  -Continue CIR therapies including PT, OT   -pain remains a limiting factor.  2. DVT Prophylaxis/Anticoagulation: Subcutaneous heparin.     Vascular studies limited, but negative for DVT 3. Pain Management:Hydrocodone,Robaxin as needed  -heat, ice discussed as well. Schedule ice to left knee, back  -added low dose ms contin 15mg  q12 4. Mood:Provide emotional support 5. Neuropsych: This patientiscapable of making decisions on herown behalf. 6. Skin/Wound Care/right foot wound ulceration:  -WOC RN, appreciate wound care recs 7. Fluids/Electrolytes/Nutrition: 8. MSSA bacteremia.IV Ancef until 08/13/2018. Follow-up per infectious disease 9. Acute on chronic anemia.   -diet/fe+ supp  -Hgb 7.2 on 2/14, will transfuse given limited energy and activity tolerance and downward trend  Posttransfusion labs ordered  Hemoccult negative on 2/13 10. Diabetes mellitus with peripheral neuropathy. Hemoglobin A1c 8.7. Lantus insulin 25 units daily.    Labile on 2/14, monitor for trend 11. Hypertension. Lisinopril 10 mg daily,Lasix 40 mg daily,Lopressor 25 mg twice a day.   Relatively controlled on 2/14  12. B12 deficiency.Continue vitamin B12 13.  Hyponatremia  Sodium 134 on 2/12  Continue to monitor    LOS: 8 days A FACE TO FACE EVALUATION WAS PERFORMED  Eliodoro Gullett Lorie Phenix 07/04/2018, 9:35 AM

## 2018-07-04 NOTE — Progress Notes (Signed)
Occupational Therapy Session Note  Patient Details  Name: Kaitlin Vargas MRN: [id number] Date of Birth: 02-23-1951  Today's Date: 07/04/2018 OT Individual Time: 1005-1020 OT Individual Time Calculation (min): 15 min  and Today's Date: 07/04/2018 OT Missed Time: 43 Minutes Missed Time Reason: Pain;Patient fatigue   Short Term Goals: Week 1:  OT Short Term Goal 1 (Week 1): Pt will tolerate standing for 2 mins with min A to increase endurance for BADL tasks OT Short Term Goal 2 (Week 1): Pt will complete 1 step of LB dressing task OT Short Term Goal 3 (Week 1): Pt will complete stand-piovt toilet transfer with mod A OT Short Term Goal 4 (Week 1): Pt will complete sit<>stand with min A  Skilled Therapeutic Interventions/Progress Updates:    Pt greeted sitting in wc, reporting 10/10 pain in back. Pt also reports feeling extremely tired today. Pt agreeable to try to participate in UB there-ex seated in wc but declines any BADL tasks. OT provided pt with orange thera-band and pt completed 1 set each of 10 reps- bicep curls and straight arm raises. Pt then reported muscle spasms and declined to participate further. Pt left seated in wc with needs met and safety belt on.   Therapy Documentation Precautions:  Precautions Precautions: Fall, Back Precaution Comments: back precautions for comfort Restrictions Weight Bearing Restrictions: No General: General OT Amount of Missed Time: 45 Minutes Pain: Pain Assessment Pain Scale: 0-10 Pain Score: 10  Pain Type: Chronic pain Pain Location: Back Pain Orientation: Lower Pain Descriptors / Indicators: Aching Pain Frequency: Constant Pain Onset: On-going Patients Stated Pain Goal: 4 Pain Intervention(s): Repositioned, heat applied  Therapy/Group: Individual Therapy  Valma Cava 07/04/2018, 2:20 PM

## 2018-07-04 NOTE — Progress Notes (Signed)
Physical Therapy Session Note  Patient Details  Name: Kaitlin Vargas MRN: [id number] Date of Birth: 1950-07-31  Today's Date: 07/04/2018 PT Individual Time:   900-930 30 min   Short Term Goals: Week 1:  PT Short Term Goal 1 (Week 1): pt will perform functional transfers with min A PT Short Term Goal 2 (Week 1): pt will perform gait x 15' with min A in controlled environment  Skilled Therapeutic Interventions/Progress Updates:   Pt received sitting in WC and agreeable to PT, but reports extreme pain and fatigue. Pt transported to rehab gym. Multiple HS spasm/cramping episodes while sitting in WC once in Rehab gym, with little relief from stretching by PT. Sit<>stand from WC x 3 with min-mod assist; with verbal increase in pain with transition. Pt attempted to perform gait training x 2 but continues to report increased LBP and Bil foot and HS cramps when attempting to take steps, as well as increased pallor in face. PT reviewed recent Labs and noted continued hyponatremia as well as decreased Hemoglobin levels from 7.5 on 7/12 to 7.2 on 7/14. PT notified MD and RN a of pt condition, and returned pt to room. Pt requested to stay in [facility] to reduce radicular  Pain.        Therapy Documentation Precautions:  Precautions Precautions: Fall, Back Precaution Comments: back precautions for comfort Restrictions Weight Bearing Restrictions: No General:   missed time: 30 min ( pain and fatigue)     Pain: Pain Assessment Pain Scale: 0-10 Pain Score: 8  Pain Type: Chronic pain Pain Location: Back Pain Orientation: Lower Pain Radiating Towards: legs Pain Descriptors / Indicators: Aching Pain Frequency: Constant Pain Onset: On-going Pain Intervention(s): Medication (See eMAR)    Therapy/Group: Individual Therapy  Lorie Phenix 07/04/2018, 9:24 AM

## 2018-07-05 ENCOUNTER — Inpatient Hospital Stay (HOSPITAL_COMMUNITY): Payer: Medicare HMO | Admitting: Physical Therapy

## 2018-07-05 ENCOUNTER — Inpatient Hospital Stay (HOSPITAL_COMMUNITY): Payer: Medicare HMO

## 2018-07-05 LAB — BPAM RBC
Blood Product Expiration Date: 202002282359
Blood Product Expiration Date: 202002292359
ISSUE DATE / TIME: 202002141443
ISSUE DATE / TIME: 202002141758
Unit Type and Rh: 7300
Unit Type and Rh: 7300

## 2018-07-05 LAB — TYPE AND SCREEN
ABO/RH(D): B POS
Antibody Screen: NEGATIVE
Unit division: 0
Unit division: 0

## 2018-07-05 LAB — CBC
HCT: 33.4 % — ABNORMAL LOW (ref 36.0–46.0)
Hemoglobin: 10.6 g/dL — ABNORMAL LOW (ref 12.0–15.0)
MCH: 31.5 pg (ref 26.0–34.0)
MCHC: 31.7 g/dL (ref 30.0–36.0)
MCV: 99.1 fL (ref 80.0–100.0)
Platelets: 277 10*3/uL (ref 150–400)
RBC: 3.37 MIL/uL — ABNORMAL LOW (ref 3.87–5.11)
RDW: 21.1 % — ABNORMAL HIGH (ref 11.5–15.5)
WBC: 6.8 10*3/uL (ref 4.0–10.5)
nRBC: 0 % (ref 0.0–0.2)

## 2018-07-05 LAB — GLUCOSE, CAPILLARY
Glucose-Capillary: 97 mg/dL (ref 70–99)
Glucose-Capillary: 192 mg/dL — ABNORMAL HIGH (ref 70–99)
Glucose-Capillary: 61 mg/dL — ABNORMAL LOW (ref 70–99)
Glucose-Capillary: 65 mg/dL — ABNORMAL LOW (ref 70–99)
Glucose-Capillary: 104 mg/dL — ABNORMAL HIGH (ref 70–99)
Glucose-Capillary: 107 mg/dL — ABNORMAL HIGH (ref 70–99)

## 2018-07-05 MED ORDER — METHOCARBAMOL 750 MG PO TABS
750.0000 mg | ORAL_TABLET | Freq: Four times a day (QID) | ORAL | Status: DC
  Administered 2018-07-05 – 2018-07-07 (×8): 750 mg via ORAL
  Filled 2018-07-05 (×8): qty 1

## 2018-07-05 NOTE — Progress Notes (Signed)
Physical Therapy Session Note  Patient Details  Name: Kaitlin Vargas MRN: [id number] Date of Birth: August 19, 1950  Today's Date: 07/05/2018 PT Individual Time: 7026-3785 PT Individual Time Calculation (min): 28 min   Short Term Goals: Week 1:  PT Short Term Goal 1 (Week 1): pt will perform functional transfers with min A PT Short Term Goal 2 (Week 1): pt will perform gait x 15' with min A in controlled environment  Skilled Therapeutic Interventions/Progress Updates:    Patient received up in [facility], pleasant and willing to attempt therapy today but reporting high levels of pain, RN aware. Able to self-propel WC approximately 135f with S and assist for PICC line management, Min-Mod VC for navigation in hallway as on multiple occasions she had quite a bit of difficulty in effectively navigating around corners and obstacles. Deferred gait due to high pain levels this afternoon, however was able to perform 2 sit to stands with RW and MinA and patient able to maintain standing for 90-150 seconds, limited by pain. She then was able to self-propel WC back to her room with ongoing Min-Mod VC for navigation and obstacle avoidance, and was left up in her WC with all needs met, seat belt alarm active this afternoon.   Therapy Documentation Precautions:  Precautions Precautions: Fall, Back Precaution Comments: back precautions for comfort Restrictions Weight Bearing Restrictions: No General:   Pain: Pain Assessment Pain Scale: 0-10 Pain Score: 8  Pain Type: Chronic pain Pain Location: Back Pain Orientation: Lower Pain Descriptors / Indicators: Aching;Sore Pain Frequency: Constant Pain Onset: On-going Patients Stated Pain Goal: 0 Pain Intervention(s): Repositioned;Ambulation/increased activity;Heat applied Multiple Pain Sites: No    Therapy/Group: Individual Therapy  KDeniece ReePT, DPT, CBIS  Supplemental Physical Therapist [facility]   Pager [phone] Rehab Office  [phone]  07/05/2018, 3:21 PM

## 2018-07-05 NOTE — Plan of Care (Signed)
  Problem: RH BLADDER ELIMINATION Goal: RH STG MANAGE BLADDER WITH ASSISTANCE Description STG Manage Bladder With min Assistance  Outcome: Not Progressing; pt retains urine ; in and out cath

## 2018-07-05 NOTE — Progress Notes (Signed)
Occupational Therapy Session Note  Patient Details  Name: Kaitlin Vargas MRN: [id number] Date of Birth: 10-18-1950  Today's Date: 07/05/2018 OT Individual Time: 9518-8416 OT Individual Time Calculation (min): 70 min    Short Term Goals: Week 2:  OT Short Term Goal 1 (Week 2): Pt will tolerate standing for 2 mins with min A to increase endurance for BADL tasks OT Short Term Goal 2 (Week 2): Pt will complete sit<>stand with consistent min A OT Short Term Goal 3 (Week 2): Pt will be able to thread B LEs into pant legs using ADL AE and min verbal cues  Skilled Therapeutic Interventions/Progress Updates:    1:1. Pt received in w/c with RN present reporting back pain but would like to shower. Pt repositioned with heat at end of session for pain. OT locates padded TTB for comfort for pt to shower on. Pt completes stand pivot transfer with MOD-MAX A and grab bar use w/c<>toilet/TTB in shower. PICC covered by seal wrap and bag. Pt bathes UB with VC and peri area. OT washes buttocks with pt leaning forward over padded TTBwith sut out to access cleansing bottom. Pt able to thread 1LE into pants using reacher with MAX VC. Pt sit to stand at sink with MOD A for boost up into standing and OT advances pants past hips. Pt compeltes UB dressignw ith A to managme zipper. Pt sits at sink for grooming with set up in w/c. Exited session with pt seated in w/c, belt alarm donned and call light inreach  Therapy Documentation Precautions:  Precautions Precautions: Fall, Back Precaution Comments: back precautions for comfort Restrictions Weight Bearing Restrictions: No General:   Vital Signs:   Pain: Pain Assessment Pain Scale: 0-10 Pain Score: 8  Pain Type: Chronic pain Pain Location: Back Pain Orientation: Lower Pain Radiating Towards: legs Pain Descriptors / Indicators: Aching Pain Frequency: Constant Pain Onset: On-going Pain Intervention(s): Medication (See eMAR) ADL: ADL Eating: Set up Grooming:  Setup Upper Body Bathing: Minimal assistance Lower Body Bathing: Maximal assistance Upper Body Dressing: Minimal assistance Lower Body Dressing: Dependent Toileting: Not assessed Toilet Transfer: Maximal assistance Toilet Transfer Method: Stand pivot Vision   Perception    Praxis   Exercises:   Other Treatments:     Therapy/Group: Individual Therapy  Tonny Branch 07/05/2018, 9:20 AM

## 2018-07-05 NOTE — Progress Notes (Signed)
Hypoglycemic Event  CBG: 65  Treatment: orange juice  Symptoms: none  Follow-up CBG: Time:1855 CBG Result:107  Possible Reasons for Event: unknown  Comments/MD notified:protocol followed    [name]

## 2018-07-05 NOTE — Progress Notes (Signed)
Hypoglycemic Event  CBG: 61  Treatment: dinner  Symptoms: none  Follow-up CBG: EXMD:4709 CBG Result:65  Possible Reasons for Event: unknown  Comments/MD notified:protocol followed    [name]

## 2018-07-05 NOTE — Progress Notes (Signed)
[place] PHYSICAL MEDICINE & REHABILITATION PROGRESS NOTE   Subjective/Complaints: Pt states she had a "rough night" was in the recliner for several hours.  ROS: Patient denies fever, rash, sore throat, blurred vision, nausea, vomiting, diarrhea, cough, shortness of breath or chest pain,   headache, or mood change.    Objective:   No results found. Recent Labs    07/04/18 0432 07/05/18 0334  WBC 5.3 6.8  HGB 7.2* 10.6*  HCT 24.4* 33.4*  PLT 283 277   No results for input(s): NA, K, CL, CO2, GLUCOSE, BUN, CREATININE, CALCIUM in the last 72 hours.  Intake/Output Summary (Last 24 hours) at 07/05/2018 0907 Last data filed at 07/05/2018 0842 Gross per 24 hour  Intake 2116 ml  Output 1400 ml  Net 716 ml     Physical Exam: Vital Signs Blood pressure 137/79, pulse 96, temperature 97.7 F (36.5 C), temperature source Oral, resp. rate 18, height 4\' 10"  (1.473 m), weight 77.4 kg, SpO2 96 %.  Constitutional: No distress . Vital signs reviewed. HEENT: EOMI, oral membranes moist Neck: supple Cardiovascular: RRR without murmur. No JVD    Respiratory: CTA Bilaterally without wheezes or rales. Normal effort    GI: BS +, non-tender, non-distended  Musc: No edema or tenderness in extremities. Musc: Low back TTP Neurological: Alert HOH Motor: Bilateral UE 4/5 proximal to distal.  Bilateral LE: Limited by pain and effort ?2/5 proximally, 3+/5 ADF.  Skin:lesions bilateral lower extremities Right foot ulceration which is dressed.  Psych: Normal mood.  Normal behavior.  Assessment/Plan: 1. Functional deficits secondary to Lumbar discitis which require 3+ hours per day of interdisciplinary therapy in a comprehensive inpatient rehab setting.  Physiatrist is providing close team supervision and 24 hour management of active medical problems listed below.  Physiatrist and rehab team continue to assess barriers to discharge/monitor patient progress toward functional and medical  goals  Care Tool:  Bathing    Body parts bathed by patient: Right arm, Chest, Left arm, Abdomen, Face, Right upper leg, Left upper leg, Front perineal area, Right lower leg, Left lower leg   Body parts bathed by helper: Buttocks Body parts n/a: Front perineal area, Buttocks   Bathing assist Assist Level: Moderate Assistance - Patient 50 - 74%     Upper Body Dressing/Undressing Upper body dressing   What is the patient wearing?: Dress    Upper body assist Assist Level: Minimal Assistance - Patient > 75%    Lower Body Dressing/Undressing Lower body dressing      What is the patient wearing?: Incontinence brief     Lower body assist Assist for lower body dressing: Total Assistance - Patient < 25%     Toileting Toileting Toileting Activity did not occur Landscape architect and hygiene only): Refused  Toileting assist Assist for toileting: Maximal Assistance - Patient 25 - 49%     Transfers Chair/bed transfer  Transfers assist  Chair/bed transfer activity did not occur: Safety/medical concerns  Chair/bed transfer assist level: Minimal Assistance - Patient > 75%     Locomotion Ambulation   Ambulation assist      Assist level: Moderate Assistance - Patient 50 - 74% Assistive device: Walker-rolling Max distance: 15'   Walk 10 feet activity   Assist  Walk 10 feet activity did not occur: Safety/medical concerns  Assist level: Moderate Assistance - Patient - 50 - 74% Assistive device: Walker-rolling   Walk 50 feet activity   Assist Walk 50 feet with 2 turns activity did not occur: Safety/medical  concerns  Assist level: Minimal Assistance - Patient > 75%      Walk 150 feet activity   Assist Walk 150 feet activity did not occur: Safety/medical concerns         Walk 10 feet on uneven surface  activity   Assist Walk 10 feet on uneven surfaces activity did not occur: Safety/medical concerns         Wheelchair     Assist Will patient  use wheelchair at discharge?: No      Wheelchair assist level: Supervision/Verbal cueing Max wheelchair distance: 150    Wheelchair 50 feet with 2 turns activity    Assist        Assist Level: Supervision/Verbal cueing   Wheelchair 150 feet activity     Assist     Assist Level: Supervision/Verbal cueing    Medical Problem List and Plan: 1.Decreased functional mobilitysecondary to L1-2 discitis/ osteomyelitis as well as right foot ulcerationcomplicated by cardiac arrest  -Continue CIR therapies including PT, OT   -pain remains limiting  2. DVT Prophylaxis/Anticoagulation: Subcutaneous heparin.     Vascular studies limited, but negative for DVT 3. Pain Management:Hydrocodone as needed  -change robaxin to 750mg  q6 scheduled  -heat, ice   to left knee, back  -added low dose ms contin 15mg  q12 4. Mood:Provide emotional support 5. Neuropsych: This patientiscapable of making decisions on herown behalf. 6. Skin/Wound Care/right foot wound ulceration:  -WOC RN, appreciate wound care recs 7. Fluids/Electrolytes/Nutrition: 8. MSSA bacteremia.IV Ancef until 08/13/2018. Follow-up per infectious disease 9. Acute on chronic anemia.   -diet/fe+ supp  -Hgb back to 10.6 today after transfusion of 2u PRBC  -stool ob negative 2/13 10. Diabetes mellitus with peripheral neuropathy. Hemoglobin A1c 8.7. Lantus insulin 25 units daily.    Labile on 2/15, depends upon how much she's eating 11. Hypertension. Lisinopril 10 mg daily,Lasix 40 mg daily,Lopressor 25 mg twice a day.   Relatively controlled on 2/15 12. B12 deficiency.Continue vitamin B12 13.  Hyponatremia  Sodium 134 on 2/12  Continue to monitor    LOS: 9 days A FACE TO Bernice 07/05/2018, 9:07 AM

## 2018-07-06 ENCOUNTER — Inpatient Hospital Stay (HOSPITAL_COMMUNITY): Payer: Medicare HMO | Admitting: Physical Therapy

## 2018-07-06 LAB — URINALYSIS, COMPLETE (UACMP) WITH MICROSCOPIC
BILIRUBIN URINE: NEGATIVE
Bacteria, UA: NONE SEEN
Glucose, UA: 50 mg/dL — AB
Hgb urine dipstick: NEGATIVE
Ketones, ur: NEGATIVE mg/dL
Leukocytes,Ua: NEGATIVE
Nitrite: NEGATIVE
Protein, ur: NEGATIVE mg/dL
Specific Gravity, Urine: 1.011 (ref 1.005–1.030)
pH: 7 (ref 5.0–8.0)

## 2018-07-06 LAB — GLUCOSE, CAPILLARY
Glucose-Capillary: 79 mg/dL (ref 70–99)
Glucose-Capillary: 237 mg/dL — ABNORMAL HIGH (ref 70–99)
Glucose-Capillary: 151 mg/dL — ABNORMAL HIGH (ref 70–99)
Glucose-Capillary: 75 mg/dL (ref 70–99)

## 2018-07-06 NOTE — Progress Notes (Signed)
[facility] PHYSICAL MEDICINE & REHABILITATION PROGRESS NOTE   Subjective/Complaints: Patient slept off and on last night.  Stated that she did not sleep partially because she had to be catheterized around 3:30 in the morning  ROS: Patient denies fever, rash, sore throat, blurred vision, nausea, vomiting, diarrhea, cough, shortness of breath or chest pain, headache, or mood change. .   Objective:   No results found. Recent Labs    07/04/18 0432 07/05/18 0334  WBC 5.3 6.8  HGB 7.2* 10.6*  HCT 24.4* 33.4*  PLT 283 277   No results for input(s): NA, K, CL, CO2, GLUCOSE, BUN, CREATININE, CALCIUM in the last 72 hours.  Intake/Output Summary (Last 24 hours) at 07/06/2018 0833 Last data filed at 07/06/2018 0333 Gross per 24 hour  Intake 120 ml  Output 2200 ml  Net -2080 ml     Physical Exam: Vital Signs Blood pressure 140/68, pulse (!) 104, temperature 98.2 F (36.8 C), resp. rate 18, height 4\' 10"  (1.473 m), weight 77.4 kg, SpO2 97 %.  Constitutional: No distress . Vital signs reviewed. HEENT: EOMI, oral membranes moist Neck: supple Cardiovascular: RRR without murmur. No JVD    Respiratory: CTA Bilaterally without wheezes or rales. Normal effort    GI: BS +, non-tender, non-distended    Musc: Low back TTP Neurological: Alert HOH Motor: Bilateral UE 4/5 proximal to distal.  Bilateral LE: Limited by pain and effort ?2/5 proximally, 3+/5 ADF.  Skin:lesions bilateral lower extremities Right foot ulceration which is dressed.  Psych: Flat    Assessment/Plan: 1. Functional deficits secondary to Lumbar discitis which require 3+ hours per day of interdisciplinary therapy in a comprehensive inpatient rehab setting.  Physiatrist is providing close team supervision and 24 hour management of active medical problems listed below.  Physiatrist and rehab team continue to assess barriers to discharge/monitor patient progress toward functional and medical goals  Care  Tool:  Bathing    Body parts bathed by patient: Right arm, Chest, Left arm, Abdomen, Face, Right upper leg, Left upper leg, Front perineal area(declines feet)   Body parts bathed by helper: Buttocks Body parts n/a: Front perineal area, Buttocks   Bathing assist Assist Level: Minimal Assistance - Patient > 75%     Upper Body Dressing/Undressing Upper body dressing   What is the patient wearing?: Dress    Upper body assist Assist Level: Minimal Assistance - Patient > 75%(zipper)    Lower Body Dressing/Undressing Lower body dressing      What is the patient wearing?: Incontinence brief, Pants     Lower body assist Assist for lower body dressing: Total Assistance - Patient < 25%     Toileting Toileting Toileting Activity did not occur Landscape architect and hygiene only): Refused  Toileting assist Assist for toileting: Maximal Assistance - Patient 25 - 49%     Transfers Chair/bed transfer  Transfers assist  Chair/bed transfer activity did not occur: Safety/medical concerns  Chair/bed transfer assist level: Moderate Assistance - Patient 50 - 74%     Locomotion Ambulation   Ambulation assist      Assist level: Moderate Assistance - Patient 50 - 74% Assistive device: Walker-rolling Max distance: 15'   Walk 10 feet activity   Assist  Walk 10 feet activity did not occur: Safety/medical concerns  Assist level: Moderate Assistance - Patient - 50 - 74% Assistive device: Walker-rolling   Walk 50 feet activity   Assist Walk 50 feet with 2 turns activity did not occur: Safety/medical concerns  Assist  level: Minimal Assistance - Patient > 75%      Walk 150 feet activity   Assist Walk 150 feet activity did not occur: Safety/medical concerns         Walk 10 feet on uneven surface  activity   Assist Walk 10 feet on uneven surfaces activity did not occur: Safety/medical concerns         Wheelchair     Assist Will patient use wheelchair at  discharge?: No      Wheelchair assist level: Supervision/Verbal cueing Max wheelchair distance: 150    Wheelchair 50 feet with 2 turns activity    Assist        Assist Level: Supervision/Verbal cueing   Wheelchair 150 feet activity     Assist     Assist Level: Supervision/Verbal cueing    Medical Problem List and Plan: 1.Decreased functional mobilitysecondary to L1-2 discitis/ osteomyelitis as well as right foot ulcerationcomplicated by cardiac arrest  -Continue CIR therapies including PT, OT   -pain remains limiting  2. DVT Prophylaxis/Anticoagulation: Subcutaneous heparin.     Vascular studies limited, but negative for DVT 3. Pain Management:Hydrocodone as needed  -Continue Robaxin to 750mg  q6 scheduled  -heat, ice   to left knee, back  -Continue low dose ms contin 15mg  q12 4. Mood:Provide emotional support 5. Neuropsych: This patientiscapable of making decisions on herown behalf. 6. Skin/Wound Care/right foot wound ulceration:  -WOC RN, appreciate wound care recs  -Reexamine wound tomorrow 7. Fluids/Electrolytes/Nutrition: 8. MSSA bacteremia.IV Ancef until 08/13/2018. Follow-up per infectious disease 9. Acute on chronic anemia.   -diet/fe+ supp  -Hgb back to 10.6 today after transfusion of 2u PRBC  -stool ob negative 2/13 10. Diabetes mellitus with peripheral neuropathy. Hemoglobin A1c 8.7. Lantus insulin 25 units daily.    Labile on 2/15, depends upon how much she's eating 11. Hypertension. Lisinopril 10 mg daily,Lasix 40 mg daily,Lopressor 25 mg twice a day.   Relatively controlled on 2/15 12. B12 deficiency.Continue vitamin B12 13.  Hyponatremia  Sodium 134 on 2/12  Continue to monitor 14.  Urine retention: Continue intermittent catheterizations as needed.  Check urinalysis and urine culture  Consider Urecholine trial    LOS: 10 days A FACE TO FACE EVALUATION WAS PERFORMED  Meredith Staggers 07/06/2018, 8:33 AM

## 2018-07-06 NOTE — Plan of Care (Signed)
  Problem: RH BLADDER ELIMINATION Goal: RH STG MANAGE BLADDER WITH ASSISTANCE Description STG Manage Bladder With min Assistance  Outcome: Not Progressing; urinary retention ; cath needed

## 2018-07-06 NOTE — Progress Notes (Signed)
Physical Therapy Weekly Progress Note  Patient Details  Name: Kaitlin Vargas MRN: [id number] Date of Birth: 05-Jan-1951  Beginning of progress report period: June 27, 2018 End of progress report period: July 06, 2018  Today's Date: 07/06/2018 PT Individual Time: 3893-7342 PT Individual Time Calculation (min): 53 min   Pt rec'd in bed, agreeable to therapy despite c/o 10/10 pain after pain meds and hot pack. Pt mod A for bed mobility with max cues for log roll technique.  Sit <> stand mod A this session.  Pt unable to perform gait due to c/o cramps in LEs and severe back pain. Pt able to tolerate standing wt shifts x 2 minutes but unable to take steps.  Stand pivot transfer with RW and min A.  UBE 2 min fwd, 2 min bkwd for UE strength and activity tolerance.  Seated therex 3 x 10 LAQ, ankle pumps, hip flex, hip abd/add all with frequent rest breaks due to pain.  W/c mobility 30' x 2 with bilat UEs.  Pt left in room with alarm set, needs at hand.  Patient has met 1 of 2 short term goals.  Pt making slow and inconsistent progress. Mainly limited by 10/10 pain despite medications and modalities.  Long term goals downgraded to min A.  W/c goals added as pt may be w/c level at discharge  Patient continues to demonstrate the following deficits muscle weakness and decreased standing balance and decreased balance strategies and therefore will continue to benefit from skilled PT intervention to increase functional independence with mobility.  Patient not progressing toward long term goals.  See goal revision..  Continue plan of care.  PT Short Term Goals Week 1:  PT Short Term Goal 1 (Week 1): pt will perform functional transfers with min A PT Short Term Goal 1 - Progress (Week 1): Progressing toward goal PT Short Term Goal 2 (Week 1): pt will perform gait x 15' with min A in controlled environment PT Short Term Goal 2 - Progress (Week 1): Met Week 2:  PT Short Term Goal 1 (Week 2): = LTG  Skilled  Therapeutic Interventions/Progress Updates:  Ambulation/gait training;Community reintegration;DME/adaptive equipment instruction;Neuromuscular re-education;Stair training;UE/LE Strength taining/ROM;Wheelchair propulsion/positioning;Balance/vestibular training;Discharge planning;Pain management;Therapeutic Activities;UE/LE Coordination activities;Functional mobility training;Patient/family education;Splinting/orthotics;Therapeutic Exercise   Therapy Documentation Precautions:  Precautions Precautions: Fall, Back Precaution Comments: back precautions for comfort Restrictions Weight Bearing Restrictions: No Pain: Pt c/o 10/10 pain in back, meds given prior to therapy, repositioned and rests given as needed  Therapy/Group: Individual Therapy  Kaitlin Vargas 07/06/2018, 9:08 AM

## 2018-07-07 ENCOUNTER — Inpatient Hospital Stay (HOSPITAL_COMMUNITY): Payer: Medicare HMO | Admitting: Occupational Therapy

## 2018-07-07 ENCOUNTER — Inpatient Hospital Stay (HOSPITAL_COMMUNITY): Payer: Medicare HMO | Admitting: Physical Therapy

## 2018-07-07 LAB — GLUCOSE, CAPILLARY
Glucose-Capillary: 121 mg/dL — ABNORMAL HIGH (ref 70–99)
Glucose-Capillary: 147 mg/dL — ABNORMAL HIGH (ref 70–99)
Glucose-Capillary: 138 mg/dL — ABNORMAL HIGH (ref 70–99)
Glucose-Capillary: 86 mg/dL (ref 70–99)

## 2018-07-07 LAB — URINE CULTURE: Culture: <10000 — AB

## 2018-07-07 MED ORDER — BETHANECHOL CHLORIDE 10 MG PO TABS
10.0000 mg | ORAL_TABLET | Freq: Three times a day (TID) | ORAL | Status: DC
  Administered 2018-07-07 – 2018-07-10 (×10): 10 mg via ORAL
  Filled 2018-07-07 (×11): qty 1

## 2018-07-07 MED ORDER — MORPHINE SULFATE ER 15 MG PO TBCR
30.0000 mg | EXTENDED_RELEASE_TABLET | Freq: Two times a day (BID) | ORAL | Status: DC
  Administered 2018-07-07 – 2018-07-10 (×7): 30 mg via ORAL
  Filled 2018-07-07 (×8): qty 2

## 2018-07-07 MED ORDER — SENNOSIDES-DOCUSATE SODIUM 8.6-50 MG PO TABS
2.0000 | ORAL_TABLET | Freq: Every day | ORAL | Status: DC
  Administered 2018-07-07 – 2018-07-09 (×3): 2 via ORAL
  Filled 2018-07-07 (×3): qty 2

## 2018-07-07 MED ORDER — METHOCARBAMOL 500 MG PO TABS
500.0000 mg | ORAL_TABLET | Freq: Four times a day (QID) | ORAL | Status: DC
  Administered 2018-07-07 – 2018-07-09 (×7): 500 mg via ORAL
  Filled 2018-07-07 (×8): qty 1

## 2018-07-07 NOTE — Progress Notes (Signed)
Physical Therapy Session Note  Patient Details  Name: Iya Hamed MRN: [id number] Date of Birth: 12/08/1950  Today's Date: 07/07/2018 PT Individual Time: 1300-1325 PT Individual Time Calculation (min): 25 min   Short Term Goals: Week 2:  PT Short Term Goal 1 (Week 2): = LTG  Skilled Therapeutic Interventions/Progress Updates:    pt lying in bed after catheterization, c/o severe pain but requests to get out of bed.  Pt performs supine to sit with use of bed controls and min A with much increased time. Pt attempts sit to stand with RW but is unable.  stedy transfer with min A to w/c. Pt positioned for comfort in w/c. Pt unable to complete session due to pain.  Therapy Documentation Precautions:  Precautions Precautions: Fall, Back Precaution Comments: back precautions for comfort Restrictions Weight Bearing Restrictions: No General: PT Amount of Missed Time (min): 20 Minutes PT Missed Treatment Reason: Pain Pain:  pt c/o 9/10 pain in back, meds given prior to therapy, hot pack applied after therapy   Therapy/Group: Individual Therapy  Alayasia Breeding 07/07/2018, 1:45 PM

## 2018-07-07 NOTE — Plan of Care (Signed)
  Problem: RH Balance Goal: LTG Patient will maintain dynamic standing with ADLs (OT) Description LTG:  Patient will maintain dynamic standing balance with assist during activities of daily living (OT)  Flowsheets (Taken 07/07/2018 1452) LTG: Pt will maintain dynamic standing balance during ADLs with: Minimal Assistance - Patient > 75% Note:  Goals downgraded 2/17 -ESD   Problem: Sit to Stand Goal: LTG:  Patient will perform sit to stand in prep for activites of daily living with assistance level (OT) Description LTG:  Patient will perform sit to stand in prep for activites of daily living with assistance level (OT) Flowsheets (Taken 07/07/2018 1452) LTG: PT will perform sit to stand in prep for activites of daily living with assistance level: Minimal Assistance - Patient > 75% Note:  Goal downgraded 2/17-ESD   Problem: RH Bathing Goal: LTG Patient will bathe all body parts with assist levels (OT) Description LTG: Patient will bathe all body parts with assist levels (OT) Flowsheets (Taken 07/07/2018 1452) LTG: Pt will perform bathing with assistance level/cueing: Minimal Assistance - Patient > 75% Note:  Goal downgraded 2/17-ESD   Problem: RH Dressing Goal: LTG Patient will perform lower body dressing w/assist (OT) Description LTG: Patient will perform lower body dressing with assist, with/without cues in positioning using equipment (OT) Flowsheets (Taken 07/07/2018 1452) LTG: Pt will perform lower body dressing with assistance level of: Minimal Assistance - Patient > 75% Note:  Goal downgraded 2/17-ESD   Problem: RH Toileting Goal: LTG Patient will perform toileting task (3/3 steps) with assistance level (OT) Description LTG: Patient will perform toileting task (3/3 steps) with assistance level (OT)  Flowsheets (Taken 07/07/2018 1452) LTG: Pt will perform toileting task (3/3 steps) with assistance level : Minimal Assistance - Patient > 75% Note:  Goal downgraded 2/17-ESD    Problem: RH Toilet Transfers Goal: LTG Patient will perform toilet transfers w/assist (OT) Description LTG: Patient will perform toilet transfers with assist, with/without cues using equipment (OT) Flowsheets (Taken 07/07/2018 1452) LTG: Pt will perform toilet transfers with assistance level of: Minimal Assistance - Patient > 75% Note:  Goal downgraded 2/17-ESD   Problem: RH Tub/Shower Transfers Goal: LTG Patient will perform tub/shower transfers w/assist (OT) Description LTG: Patient will perform tub/shower transfers with assist, with/without cues using equipment (OT) Flowsheets (Taken 07/07/2018 1452) LTG: Pt will perform tub/shower stall transfers with assistance level of: Minimal Assistance - Patient > 75% Note:  Goal downgraded 2/17-ESD

## 2018-07-07 NOTE — Progress Notes (Signed)
Occupational Therapy Session Note  Patient Details  Name: Kaitlin Vargas MRN: [id number] Date of Birth: May 14, 1951  Session 1 Today's Date: 07/07/2018 OT Individual Time: 8657-8469 OT Individual Time Calculation (min): 30 min  and Today's Date: 07/07/2018 OT Missed Time: 30 Minutes Missed Time Reason: Pain;Patient fatigue  Session 2 Today's Date: 07/07/2018 OT Individual Time: 6295-2841 OT Individual Time Calculation (min):  27 min   Session 3 Today's Date: 07/07/2018 OT Individual Time: 1425-1510 OT Individual Time Calculation (min): 45 min  and Today's Date: 07/07/2018 OT Missed Time: 30 Minutes Missed Time Reason: Pain;Patient fatigue  Short Term Goals: Week 2:  OT Short Term Goal 1 (Week 2): Pt will tolerate standing for 2 mins with min A to increase endurance for BADL tasks OT Short Term Goal 2 (Week 2): Pt will complete sit<>stand with consistent min A OT Short Term Goal 3 (Week 2): Pt will be able to thread B LEs into pant legs using ADL AE and min verbal cues    Skilled Therapeutic Interventions/Progress Updates:    Session 1 Pt greeted semi-reclined in bed stating 10/10 pain. Pt states she had to be disimpacted last night and has high pain in her legs. Pt eventually able to complete log roll, then sidelying to sitting with mod A. Pt stated she did not think she had the LB strength to transfer without the Stedy this morning, so Stedy used to transfer pt to wc with min A to stand pulling up on Stedy. Pt set-up for breakfast and left with safety belt on and call bell in reach.  General OT Amount of Missed Time: 30 Minutes Pain: Pain Assessment Pain Scale: 0-10 Pain Score: 10 Pain Type: Chronic pain Pain Location: Back Pain Orientation: Lower Pain Descriptors / Indicators: Aching Pain Onset: With Activity Pain Intervention(s): Repositioned;Heat applied   Session 2 Pt greeted sitting in wc and agreeable to OT treatment session. Pt had just received lunch tray so OT provided  pt with walker bag and educated on functional uses within BADL tasks. Pt completed 2 sit<>stands with mod A and was able to take  3 steps forward and back 2x with min A and RW. Pt's LEs shaking from fatigue so pt returned to wc and left with safety belt on and needs met.  Pain: Pain Assessment Pain Scale: 0-10 Pain Score: 9  Pain Type: Chronic pain Pain Location: Back Pain Orientation: Lower Pain Descriptors / Indicators: Aching Pain Onset: With Activity Pain Intervention(s): Repositioned;Heat applied   Session 3 Pt greeted sitting in wc and agreeable to try to participate in OT. Discussed OT goals and POC with pt. Pt agrees that her pain has been very limiting in her overall participation with therapy. Discussed home set-up and modifications for home environment including using tub bench for transfers. Pt reports pain is slightly better at an 8/10 instead of 10/10. Pt completed 4 sets of 10 LB there-ex - knee flexion, hip flexion, ankle pumps, him adduction/abduction. Pt then completed 2 sit<>stands with min A to achieve full stand. Pt then able to take a few steps forward with RW and min A. Pt unable to tolerate more than 2 steps 2/2 pain and returned to sitting. Discussed plan for family education tomorrow during therapy and pt verbalized understanding.  General: General OT Amount of Missed Time: 30 Minutes   Therapy Documentation Precautions:  Precautions Precautions: Fall, Back Precaution Comments: back precautions for comfort Restrictions Weight Bearing Restrictions: No General: Pain: Pain Assessment Pain Scale: 0-10 Pain Score: 8  Pain Type: Chronic pain Pain Location: Back Pain Orientation: Lower Pain Descriptors / Indicators: Aching Pain Onset: With Activity Pain Intervention(s): Repositioned;Heat applied   Therapy/Group: Individual Therapy  Valma Cava 07/07/2018, 3:39 PM

## 2018-07-07 NOTE — Progress Notes (Signed)
[facility] PHYSICAL MEDICINE & REHABILITATION PROGRESS NOTE   Subjective/Complaints: Didn't sleep well as she was "decompacted" last night. Still req I/O caths. Remains in pain.   ROS: Patient denies fever, rash, sore throat, blurred vision, nausea, vomiting, diarrhea, cough, shortness of breath or chest pain,  headache, or mood change.    Objective:   No results found. Recent Labs    07/05/18 0334  WBC 6.8  HGB 10.6*  HCT 33.4*  PLT 277   No results for input(s): NA, K, CL, CO2, GLUCOSE, BUN, CREATININE, CALCIUM in the last 72 hours.  Intake/Output Summary (Last 24 hours) at 07/07/2018 0820 Last data filed at 07/07/2018 0500 Gross per 24 hour  Intake 480 ml  Output 1233 ml  Net -753 ml     Physical Exam: Vital Signs Blood pressure (!) 166/87, pulse 97, temperature 97.9 F (36.6 C), temperature source Oral, resp. rate 18, height 4\' 10"  (1.473 m), weight 77.4 kg, SpO2 100 %.  Constitutional: No distress . Vital signs reviewed.appears uncomfortable HEENT: EOMI, oral membranes moist Neck: supple Cardiovascular: RRR without murmur. No JVD    Respiratory: CTA Bilaterally without wheezes or rales. Normal effort    GI: BS +, non-tender, non-distended  Musc: Low back remains TTP, both legs tender as well.  Neurological: Alert HOH Motor: Bilateral UE 4/5 proximal to distal.  Bilateral LE: remain limited by pain 2- to 2/5 proximally, 3+/5 ADF.  Skin:lesions bilateral lower extremities healed Right foot ulceration granulated in nicely.  Psych: Flat    Assessment/Plan: 1. Functional deficits secondary to Lumbar discitis which require 3+ hours per day of interdisciplinary therapy in a comprehensive inpatient rehab setting.  Physiatrist is providing close team supervision and 24 hour management of active medical problems listed below.  Physiatrist and rehab team continue to assess barriers to discharge/monitor patient progress toward functional and medical goals  Care  Tool:  Bathing    Body parts bathed by patient: Right arm, Chest, Left arm, Abdomen, Face, Right upper leg, Left upper leg, Front perineal area(declines feet)   Body parts bathed by helper: Buttocks Body parts n/a: Front perineal area, Buttocks   Bathing assist Assist Level: Minimal Assistance - Patient > 75%     Upper Body Dressing/Undressing Upper body dressing   What is the patient wearing?: Dress    Upper body assist Assist Level: Minimal Assistance - Patient > 75%(zipper)    Lower Body Dressing/Undressing Lower body dressing      What is the patient wearing?: Incontinence brief, Pants     Lower body assist Assist for lower body dressing: Total Assistance - Patient < 25%     Toileting Toileting Toileting Activity did not occur Landscape architect and hygiene only): Refused  Toileting assist Assist for toileting: Maximal Assistance - Patient 25 - 49%     Transfers Chair/bed transfer  Transfers assist  Chair/bed transfer activity did not occur: Safety/medical concerns  Chair/bed transfer assist level: Moderate Assistance - Patient 50 - 74%     Locomotion Ambulation   Ambulation assist      Assist level: Moderate Assistance - Patient 50 - 74% Assistive device: Walker-rolling Max distance: 15'   Walk 10 feet activity   Assist  Walk 10 feet activity did not occur: Safety/medical concerns  Assist level: Moderate Assistance - Patient - 50 - 74% Assistive device: Walker-rolling   Walk 50 feet activity   Assist Walk 50 feet with 2 turns activity did not occur: Safety/medical concerns  Assist level: Minimal Assistance -  Patient > 75%      Walk 150 feet activity   Assist Walk 150 feet activity did not occur: Safety/medical concerns         Walk 10 feet on uneven surface  activity   Assist Walk 10 feet on uneven surfaces activity did not occur: Safety/medical concerns         Wheelchair     Assist Will patient use wheelchair at  discharge?: No      Wheelchair assist level: Supervision/Verbal cueing Max wheelchair distance: 150    Wheelchair 50 feet with 2 turns activity    Assist        Assist Level: Supervision/Verbal cueing   Wheelchair 150 feet activity     Assist     Assist Level: Supervision/Verbal cueing    Medical Problem List and Plan: 1.Decreased functional mobilitysecondary to L1-2 discitis/ osteomyelitis as well as right foot ulcerationcomplicated by cardiac arrest  -Continue CIR therapies including PT, OT   -pain remains limiting factor  2. DVT Prophylaxis/Anticoagulation: Subcutaneous heparin.     Vascular studies limited, but negative for DVT 3. Pain Management:Hydrocodone as needed  -reduce Robaxin to 500mg  q6   -heat, ice   to left knee, back  -increase ms contin to 30mg  q12---monitor Mental status with change 4. Mood:Provide emotional support 5. Neuropsych: This patientiscapable of making decisions on herown behalf. 6. Skin/Wound Care/right foot wound ulceration:  -WOC RN, appreciate wound care recs  -wound appears to be healing nicely 7. Fluids/Electrolytes/Nutrition: 8. MSSA bacteremia.IV Ancef until 08/13/2018. Follow-up per infectious disease 9. Acute on chronic anemia.   -diet/fe+ supp  -Hgb back to 10.6 after transfusion of 2u PRBC  -stool ob negative 2/13  -recheck labs tomorrow 10. Diabetes mellitus with peripheral neuropathy. Hemoglobin A1c 8.7. Lantus insulin 25 units daily.    More consistent last 24 hours. Varies somewhat depending upon intake 11. Hypertension. Lisinopril 10 mg daily,Lasix 40 mg daily,Lopressor 25 mg twice a day.   Relatively controlled on 2/15 12. B12 deficiency.Continue vitamin B12 13.  Hyponatremia  Sodium 134 on 2/12  Recheck tomorrow 14.  Urine retention: Continue intermittent catheterizations as needed.  ua neg, ucx pending  begin Urecholine trial 15. Constipation?: continue bowel regimen   LOS: 11 days A FACE  TO FACE EVALUATION WAS PERFORMED  Meredith Staggers 07/07/2018, 8:20 AM

## 2018-07-08 ENCOUNTER — Inpatient Hospital Stay (HOSPITAL_COMMUNITY): Payer: Medicare HMO | Admitting: Occupational Therapy

## 2018-07-08 ENCOUNTER — Inpatient Hospital Stay (HOSPITAL_COMMUNITY): Payer: Medicare HMO | Admitting: Physical Therapy

## 2018-07-08 LAB — COMPREHENSIVE METABOLIC PANEL
ALT: <5 U/L (ref 0–44)
AST: 14 U/L — ABNORMAL LOW (ref 15–41)
Albumin: 1.9 g/dL — ABNORMAL LOW (ref 3.5–5.0)
Alkaline Phosphatase: 121 U/L (ref 38–126)
Anion gap: 9 (ref 5–15)
BUN: 15 mg/dL (ref 8–23)
CO2: 29 mmol/L (ref 22–32)
Calcium: 8.3 mg/dL — ABNORMAL LOW (ref 8.9–10.3)
Chloride: 97 mmol/L — ABNORMAL LOW (ref 98–111)
Creatinine, Ser: 0.6 mg/dL (ref 0.44–1.00)
GFR calc Af Amer: >60 mL/min (ref >60)
GFR calc non Af Amer: >60 mL/min (ref >60)
Glucose, Bld: 108 mg/dL — ABNORMAL HIGH (ref 70–99)
Potassium: 3.5 mmol/L (ref 3.5–5.1)
Sodium: 135 mmol/L (ref 135–145)
Total Bilirubin: 0.5 mg/dL (ref 0.3–1.2)
Total Protein: 5.7 g/dL — ABNORMAL LOW (ref 6.5–8.1)

## 2018-07-08 LAB — GLUCOSE, CAPILLARY
GLUCOSE-CAPILLARY: 203 mg/dL — AB (ref 70–99)
GLUCOSE-CAPILLARY: 98 mg/dL (ref 70–99)
Glucose-Capillary: 112 mg/dL — ABNORMAL HIGH (ref 70–99)
Glucose-Capillary: 28 mg/dL — CL (ref 70–99)
Glucose-Capillary: 29 mg/dL — CL (ref 70–99)
Glucose-Capillary: 29 mg/dL — CL (ref 70–99)
Glucose-Capillary: 32 mg/dL — CL (ref 70–99)
Glucose-Capillary: 86 mg/dL (ref 70–99)

## 2018-07-08 LAB — CBC
HCT: 32.5 % — ABNORMAL LOW (ref 36.0–46.0)
Hemoglobin: 10 g/dL — ABNORMAL LOW (ref 12.0–15.0)
MCH: 31.4 pg (ref 26.0–34.0)
MCHC: 30.8 g/dL (ref 30.0–36.0)
MCV: 102.2 fL — AB (ref 80.0–100.0)
PLATELETS: 269 10*3/uL (ref 150–400)
RBC: 3.18 MIL/uL — ABNORMAL LOW (ref 3.87–5.11)
RDW: 18.4 % — ABNORMAL HIGH (ref 11.5–15.5)
WBC: 4.6 10*3/uL (ref 4.0–10.5)
nRBC: 0 % (ref 0.0–0.2)

## 2018-07-08 MED ORDER — INSULIN ASPART 100 UNIT/ML ~~LOC~~ SOLN
0.0000 [IU] | Freq: Three times a day (TID) | SUBCUTANEOUS | Status: DC
  Administered 2018-07-09: 2 [IU] via SUBCUTANEOUS

## 2018-07-08 MED ORDER — DEXTROSE 50 % IV SOLN
0.5000 | Freq: Once | INTRAVENOUS | Status: AC
  Administered 2018-07-08: 25 mL via INTRAVENOUS

## 2018-07-08 MED ORDER — GLUCOSE 40 % PO GEL
ORAL | Status: AC
  Administered 2018-07-08: 1
  Filled 2018-07-08: qty 1

## 2018-07-08 MED ORDER — GLUCERNA SHAKE PO LIQD
237.0000 mL | Freq: Three times a day (TID) | ORAL | Status: DC
  Administered 2018-07-08 – 2018-07-10 (×6): 237 mL via ORAL

## 2018-07-08 MED ORDER — DEXTROSE 50 % IV SOLN
INTRAVENOUS | Status: AC
  Filled 2018-07-08: qty 50

## 2018-07-08 NOTE — Significant Event (Signed)
Hypoglycemic Event  CBG: 35 @ 1636  Treatment: 1 tube glucose gel  Symptoms: None  Follow-up CBG: Time:1702 CBG Result:29  Possible Reasons for Event: Unknown  Comments/MD notified:Pam Love, PA notified. New orders reviewed.    Patients CBG at initial check was 35.  RN asked NT to recheck value due to patients lack of symptoms.  CBG at 1640= 32.  Gave patient 1/2 tube of glucose gel and gave patient her meal to eat.  Rechecked blood sugar at 1702 and CBG was 29.  Administered other 1/2 tube of glucose gel and to continue to eat meal.  Rechecked at 1713, CBG 28.  Called Pam Love, PA and received orders to give 1/2 amp D50.  Rechecked blood sugar prior to administration with different glucometer with value of 29.  Administered 1/2 amp D50 per order through PICC.  Rechecked blood sugar at 1736 = 98.  Notified Pam Love, PA of therapy response.  Patient has been asymptomatic the entire time.  Will continue to monitor.  Brita Romp, RN     Brita Romp

## 2018-07-08 NOTE — Progress Notes (Signed)
[facility] PHYSICAL MEDICINE & REHABILITATION PROGRESS NOTE   Subjective/Complaints: Pt actually states that her pain is BETTER today, except for "knees". Had bm this morning  ROS: Patient denies fever, rash, sore throat, blurred vision, nausea, vomiting, diarrhea, cough, shortness of breath or chest pain,  headache, or mood change.   Objective:   No results found. Recent Labs    07/08/18 0515  WBC 4.6  HGB 10.0*  HCT 32.5*  PLT 269   Recent Labs    07/08/18 0515  NA 135  K 3.5  CL 97*  CO2 29  GLUCOSE 108*  BUN 15  CREATININE 0.60  CALCIUM 8.3*    Intake/Output Summary (Last 24 hours) at 07/08/2018 0815 Last data filed at 07/08/2018 0450 Gross per 24 hour  Intake 720 ml  Output 2055 ml  Net -1335 ml     Physical Exam: Vital Signs Blood pressure (!) 160/87, pulse (!) 103, temperature 97.8 F (36.6 C), resp. rate 20, height 4\' 10"  (1.473 m), weight 77.4 kg, SpO2 97 %.  Constitutional: No distress . Vital signs reviewed. alert HEENT: EOMI, oral membranes moist Neck: supple Cardiovascular: RRR without murmur. No JVD    Respiratory: CTA Bilaterally without wheezes or rales. Normal effort    GI: BS +, non-tender, non-distended  Musc: Low back remains TTP, both legs tender as well.  Neurological: Alert and oriented x 3 HOH Motor: Bilateral UE 4/5 proximal to distal.  Bilateral LE: remain limited by pain 2- to 2/5 proximally, 3+/5 ADF.---ongoing pain inhibition  Skin:lesions bilateral lower extremities healed Right foot ulceration granulated in nicely/closed.  Psych: Flat    Assessment/Plan: 1. Functional deficits secondary to Lumbar discitis which require 3+ hours per day of interdisciplinary therapy in a comprehensive inpatient rehab setting.  Physiatrist is providing close team supervision and 24 hour management of active medical problems listed below.  Physiatrist and rehab team continue to assess barriers to discharge/monitor patient progress toward  functional and medical goals  Care Tool:  Bathing    Body parts bathed by patient: Right arm, Chest, Left arm, Abdomen, Face, Right upper leg, Left upper leg, Front perineal area(declines feet)   Body parts bathed by helper: Buttocks Body parts n/a: Front perineal area, Buttocks   Bathing assist Assist Level: Minimal Assistance - Patient > 75%     Upper Body Dressing/Undressing Upper body dressing   What is the patient wearing?: Dress    Upper body assist Assist Level: Minimal Assistance - Patient > 75%(zipper)    Lower Body Dressing/Undressing Lower body dressing      What is the patient wearing?: Incontinence brief, Pants     Lower body assist Assist for lower body dressing: Total Assistance - Patient < 25%     Toileting Toileting Toileting Activity did not occur Landscape architect and hygiene only): Refused  Toileting assist Assist for toileting: Maximal Assistance - Patient 25 - 49%     Transfers Chair/bed transfer  Transfers assist  Chair/bed transfer activity did not occur: Safety/medical concerns  Chair/bed transfer assist level: Maximal Assistance - Patient 25 - 49%     Locomotion Ambulation   Ambulation assist      Assist level: Moderate Assistance - Patient 50 - 74% Assistive device: Walker-rolling Max distance: 15'   Walk 10 feet activity   Assist  Walk 10 feet activity did not occur: Safety/medical concerns  Assist level: Moderate Assistance - Patient - 50 - 74% Assistive device: Walker-rolling   Walk 50 feet activity  Assist Walk 50 feet with 2 turns activity did not occur: Safety/medical concerns  Assist level: Minimal Assistance - Patient > 75%      Walk 150 feet activity   Assist Walk 150 feet activity did not occur: Safety/medical concerns         Walk 10 feet on uneven surface  activity   Assist Walk 10 feet on uneven surfaces activity did not occur: Safety/medical concerns          Wheelchair     Assist Will patient use wheelchair at discharge?: No      Wheelchair assist level: Supervision/Verbal cueing Max wheelchair distance: 150    Wheelchair 50 feet with 2 turns activity    Assist        Assist Level: Supervision/Verbal cueing   Wheelchair 150 feet activity     Assist     Assist Level: Supervision/Verbal cueing    Medical Problem List and Plan: 1.Decreased functional mobilitysecondary to L1-2 discitis/ osteomyelitis as well as right foot ulcerationcomplicated by cardiac arrest  --Interdisciplinary Team Conference today    -pain a limiting factor  2. DVT Prophylaxis/Anticoagulation: Subcutaneous heparin.     Vascular studies limited, but negative for DVT 3. Pain Management:Hydrocodone as needed  -reduced Robaxin to 500mg  q6   -heat, ice   to left knee, back  -increased ms contin to 30mg  q12 2/17---appears alert 4. Mood:Provide emotional support 5. Neuropsych: This patientiscapable of making decisions on herown behalf. 6. Skin/Wound Care/right foot wound ulceration:  -WOC RN, appreciate wound care recs  -wound appears to be healing nicely 7. Fluids/Electrolytes/Nutrition:encourage po  I personally reviewed the patient's labs today.   8. MSSA bacteremia.IV Ancef until 08/13/2018. Follow-up per infectious disease 9. Acute on chronic anemia.   -diet/fe+ supp  -Hgb back to 10.6 after transfusion of 2u PRBC  -stool ob negative 2/13  -hgb 10.o today, no signs of blood loss 10. Diabetes mellitus with peripheral neuropathy. Hemoglobin A1c 8.7. Lantus insulin 25 units daily.    Control varies somewhat depending upon intake 11. Hypertension. Lisinopril 10 mg daily,Lasix 40 mg daily,Lopressor 25 mg twice a day.   borderline controlled on 2/18 12. B12 deficiency.Continue vitamin B12 13.  Hyponatremia  Sodium 135 on 2/18    14.  Urine retention: Continue intermittent catheterizations as needed.  ua neg, ucx neg     Urecholine trial with positive results so far, check pvr's 15. Constipation: bowel regimen augmented   LOS: 12 days A FACE TO FACE EVALUATION WAS PERFORMED  Meredith Staggers 07/08/2018, 8:15 AM

## 2018-07-08 NOTE — Progress Notes (Signed)
Physical Therapy Session Note  Patient Details  Name: Kaitlin Vargas MRN: [id number] Date of Birth: 1950-12-11  Today's Date: 07/08/2018 PT Individual Time: 1000-1033 PT Individual Time Calculation (min): 33 min   Short Term Goals: Week 2:  PT Short Term Goal 1 (Week 2): = LTG  Skilled Therapeutic Interventions/Progress Updates:    pt rec'd in w/c, fatigued from previous session but willing to participate. Pt performs UBE 2 min fwd/2 min bkwd x 2 with rest break in between due to pain.  Sit <> stand x 2 with pt unable to take steps due to pain and cramping.  Seated AROM with ankle pumps and LAQ to relieve cramps. Pt given handout of these exercises to help with cramps at home. Pt left in room with needs at hand, alarm set, missed 25 minutes due to pain.  Therapy Documentation Precautions:  Precautions Precautions: Fall, Back Precaution Comments: back precautions for comfort Restrictions Weight Bearing Restrictions: No General: PT Amount of Missed Time (min): 25 Minutes PT Missed Treatment Reason: Pain Pain: Pt c/o 8/10 back pain and LE pain, heat applied after session, meds given prior to session   Therapy/Group: Individual Therapy  Nathaniel Wakeley 07/08/2018, 12:07 PM

## 2018-07-08 NOTE — Progress Notes (Signed)
Occupational Therapy Note  Patient Details  Name: Kaitlin Vargas MRN: [id number] Date of Birth: 12/30/1950  Today's Date: 07/08/2018 OT Missed Time: 58 Minutes Missed Time Reason: Pain;Patient fatigue  Pt greeted semi-reclined in bed asleep. Pt states she just got back to bed and is having 10/10 pain. Pt reports she just had pain medication and refuses to get back out of bed. Pt left semi-reclined in bed with bed alarm on and needs met. Will follow up per plan of care.   Daneen Schick Keyly Baldonado 07/08/2018, 2:56 PM

## 2018-07-08 NOTE — Progress Notes (Addendum)
Occupational Therapy Session Note  Patient Details  Name: Kaitlin Vargas MRN: [id number] Date of Birth: March 29, 1951  Today's Date: 07/08/2018  Session 1 OT Individual Time: 0881-1031 OT Individual Time Calculation (min): 115 min   Short Term Goals: Week 2:  OT Short Term Goal 1 (Week 2): Pt will tolerate standing for 2 mins with min A to increase endurance for BADL tasks OT Short Term Goal 2 (Week 2): Pt will complete sit<>stand with consistent min A OT Short Term Goal 3 (Week 2): Pt will be able to thread B LEs into pant legs using ADL AE and min verbal cues  Skilled Therapeutic Interventions/Progress Updates:    Pt greeted semi-reclined in bed, agreeable to OT treatment session. Pt reports improved pain today and ready for therapy. Pt completed log roll with mod A and mod A to elevate trunk to sitting EOB. Stand-pivot transfer bed>wc>tub bench with min A using RW,  increased time and verbal cues for hand placement. Heavy use of grab bars transferring into shower. Pt able to use LH sponge to wash back and LEs, but needed assistance to wash buttocks using tub bench cut-out. Worked on LB dressing strategies using reacher, but pt still needed assistance to thread pant legs. Sit<>stand at the sink with min A. Pt able to remove unilateral UE to help pull pants up for brief periods of time. Pt's family entered room for remainder of session. Educated use of friction reducing bag to don TED hose, then assisted with donning shoes using shoe horn.  B UE grooming task of hair brushing and hair drying sitting in wc. Provided pt's family with home measurement sheet and discussed pt progress. Discussed pt's ability to participate is very dependent on her pain and there may be days she needs more assistance. Family seems to understand this. Brought pt to therapy gym and had pt's Husband [name] and daughter [name], complete stand-pivot transfer with RW. Pt the ambulated with family 11 feet, then 20 feed with Min A and RW.  Pt returned to room and left seated in wc with safety belt on and family present.  Therapy Documentation Precautions:  Precautions Precautions: Fall, Back Precaution Comments: back precautions for comfort Restrictions Weight Bearing Restrictions: No Pain: Pain Assessment Pain Scale: 0-10 Pain Score: 8  Pain Type: Chronic pain Pain Location: Back Pain Orientation: Mid;Lower Pain Descriptors / Indicators: Aching Pain Frequency: Constant Pain Onset: On-going Patients Stated Pain Goal: 2 Pain Intervention(s): Repositioned  Therapy/Group: Individual Therapy  Valma Cava 07/08/2018, 10:01 AM

## 2018-07-09 ENCOUNTER — Inpatient Hospital Stay (HOSPITAL_COMMUNITY): Payer: Medicare HMO

## 2018-07-09 ENCOUNTER — Inpatient Hospital Stay (HOSPITAL_COMMUNITY): Payer: Medicare HMO | Admitting: Occupational Therapy

## 2018-07-09 LAB — GLUCOSE, CAPILLARY
GLUCOSE-CAPILLARY: 88 mg/dL (ref 70–99)
Glucose-Capillary: 152 mg/dL — ABNORMAL HIGH (ref 70–99)
Glucose-Capillary: 115 mg/dL — ABNORMAL HIGH (ref 70–99)
Glucose-Capillary: 47 mg/dL — ABNORMAL LOW (ref 70–99)
Glucose-Capillary: 83 mg/dL (ref 70–99)

## 2018-07-09 MED ORDER — METHOCARBAMOL 750 MG PO TABS
750.0000 mg | ORAL_TABLET | Freq: Four times a day (QID) | ORAL | Status: DC
  Administered 2018-07-09 – 2018-07-10 (×5): 750 mg via ORAL
  Filled 2018-07-09 (×5): qty 1

## 2018-07-09 MED ORDER — GADOBUTROL 1 MMOL/ML IV SOLN
7.7000 mL | Freq: Once | INTRAVENOUS | Status: AC | PRN
  Administered 2018-07-09: 7.7 mL via INTRAVENOUS

## 2018-07-09 MED ORDER — CEFAZOLIN SODIUM-DEXTROSE 2-4 GM/100ML-% IV SOLN
2.0000 g | Freq: Three times a day (TID) | INTRAVENOUS | Status: DC
  Administered 2018-07-09 – 2018-07-10 (×2): 2 g via INTRAVENOUS
  Filled 2018-07-09 (×3): qty 100

## 2018-07-09 MED ORDER — RIFAMPIN 300 MG PO CAPS
300.0000 mg | ORAL_CAPSULE | Freq: Two times a day (BID) | ORAL | Status: DC
  Filled 2018-07-09: qty 1

## 2018-07-09 MED ORDER — PIPERACILLIN-TAZOBACTAM 3.375 G IVPB
3.3750 g | Freq: Three times a day (TID) | INTRAVENOUS | Status: DC
  Filled 2018-07-09: qty 50

## 2018-07-09 MED ORDER — VANCOMYCIN HCL 10 G IV SOLR
1500.0000 mg | Freq: Once | INTRAVENOUS | Status: DC
  Administered 2018-07-09: 1500 mg via INTRAVENOUS
  Filled 2018-07-09: qty 1500

## 2018-07-09 NOTE — Progress Notes (Signed)
[facility] PHYSICAL MEDICINE & REHABILITATION PROGRESS NOTE   Subjective/Complaints: After feeling better yesterday and having a good day bowel reports patient stating this morning that her pain is worse than ever and that we are not doing anything to help her.  And episodes of hypoglycemia after treating hyperglycemia at lunch.  Patient reports cramps in both her legs and pain in the legs and back.  ROS: Patient denies fever, rash, sore throat, blurred vision, nausea, vomiting, diarrhea, cough, shortness of breath or chest pain,  headache, or mood change.   Objective:   No results found. Recent Labs    07/08/18 0515  WBC 4.6  HGB 10.0*  HCT 32.5*  PLT 269   Recent Labs    07/08/18 0515  NA 135  K 3.5  CL 97*  CO2 29  GLUCOSE 108*  BUN 15  CREATININE 0.60  CALCIUM 8.3*    Intake/Output Summary (Last 24 hours) at 07/09/2018 0835 Last data filed at 07/09/2018 0240 Gross per 24 hour  Intake 490 ml  Output 600 ml  Net -110 ml     Physical Exam: Vital Signs Blood pressure (!) 166/105, pulse (!) 114, temperature 98 F (36.7 C), resp. rate 19, height 4\' 10"  (1.473 m), weight 77.4 kg, SpO2 96 %.  Constitutional: No distress . Vital signs reviewed. HEENT: EOMI, oral membranes moist Neck: supple Cardiovascular: RRR without murmur. No JVD    Respiratory: CTA Bilaterally without wheezes or rales. Normal effort    GI: BS +, non-tender, non-distended  Musc: Low back remains TTP, both legs tender as well.  Neurological: Alert and oriented x 3 HOH Motor: Bilateral UE 4/5 proximal to distal.  Bilateral LE: remain limited by pain 2- to 2/5 proximally, 3+/5 ADF.--Continue pain inhibition.  Both knees are tight but some of this is contracture related.  She refuses to move these much on her own.  Reflexes are trace to absent. Skin:lesions bilateral lower extremities healed.  Sacrum dressed Right foot ulceration granulated in nicely/closed.  Psych: irritable and  anxious   Assessment/Plan: 1. Functional deficits secondary to Lumbar discitis which require 3+ hours per day of interdisciplinary therapy in a comprehensive inpatient rehab setting.  Physiatrist is providing close team supervision and 24 hour management of active medical problems listed below.  Physiatrist and rehab team continue to assess barriers to discharge/monitor patient progress toward functional and medical goals  Care Tool:  Bathing    Body parts bathed by patient: Right arm, Chest, Left arm, Abdomen, Face, Right upper leg, Left upper leg, Front perineal area(declines feet)   Body parts bathed by helper: Buttocks Body parts n/a: Front perineal area, Buttocks   Bathing assist Assist Level: Minimal Assistance - Patient > 75%     Upper Body Dressing/Undressing Upper body dressing   What is the patient wearing?: Dress    Upper body assist Assist Level: Minimal Assistance - Patient > 75%(zipper)    Lower Body Dressing/Undressing Lower body dressing      What is the patient wearing?: Incontinence brief, Pants     Lower body assist Assist for lower body dressing: Total Assistance - Patient < 25%     Toileting Toileting Toileting Activity did not occur Landscape architect and hygiene only): Refused  Toileting assist Assist for toileting: Maximal Assistance - Patient 25 - 49%     Transfers Chair/bed transfer  Transfers assist  Chair/bed transfer activity did not occur: Safety/medical concerns  Chair/bed transfer assist level: Maximal Assistance - Patient 25 - 49%  Locomotion Ambulation   Ambulation assist      Assist level: Moderate Assistance - Patient 50 - 74% Assistive device: Walker-rolling Max distance: 15'   Walk 10 feet activity   Assist  Walk 10 feet activity did not occur: Safety/medical concerns  Assist level: Moderate Assistance - Patient - 50 - 74% Assistive device: Walker-rolling   Walk 50 feet activity   Assist Walk 50  feet with 2 turns activity did not occur: Safety/medical concerns  Assist level: Minimal Assistance - Patient > 75%      Walk 150 feet activity   Assist Walk 150 feet activity did not occur: Safety/medical concerns         Walk 10 feet on uneven surface  activity   Assist Walk 10 feet on uneven surfaces activity did not occur: Safety/medical concerns         Wheelchair     Assist Will patient use wheelchair at discharge?: No      Wheelchair assist level: Supervision/Verbal cueing Max wheelchair distance: 150    Wheelchair 50 feet with 2 turns activity    Assist        Assist Level: Supervision/Verbal cueing   Wheelchair 150 feet activity     Assist     Assist Level: Supervision/Verbal cueing    Medical Problem List and Plan: 1.Decreased functional mobilitysecondary to L1-2 discitis/ osteomyelitis as well as right foot ulcerationcomplicated by cardiac arrest  -Continue therapies as tolerated.  Per family reports patient's not too far from her baseline  -pain a limiting factor.  Tried to provide positive reinforcement to patient today.  -Given patient's ongoing in the recurrence of severe pain this morning, will go ahead and check an MRI of her lumbar spine to to rule out further infection/disease 2. DVT Prophylaxis/Anticoagulation: Subcutaneous heparin.     Vascular studies limited, but negative for DVT 3. Pain Management:Hydrocodone as needed  -reduced Robaxin to 500mg  q6---increase back to 750mg  d/t spasms   -heat, ice   to left knee, back  -increased ms contin to 30mg  q12 2/17- t 4. Mood:Provide emotional support 5. Neuropsych: This patientiscapable of making decisions on herown behalf. 6. Skin/Wound Care/right foot wound ulceration:  -WOC RN, appreciate wound care recs  -wound appears to be healing nicely 7. Fluids/Electrolytes/Nutrition:encourage po  I personally reviewed the patient's labs today.   8. MSSA bacteremia.IV  Ancef until 08/13/2018. Follow-up per infectious disease 9. Acute on chronic anemia.   -diet/fe+ supp  -Hgb back to 10.6 after transfusion of 2u PRBC  -stool ob negative 2/13  -hgb 10.o today, no signs of blood loss 10. Diabetes mellitus with peripheral neuropathy. Hemoglobin A1c 8.7. Lantus insulin 25 units daily.    Control varies somewhat depending upon intake 11. Hypertension. Lisinopril 10 mg daily,Lasix 40 mg daily,Lopressor 25 mg twice a day.   borderline controlled on 2/18 12. B12 deficiency.Continue vitamin B12 13.  Hyponatremia  Sodium 135 on 2/18    14.  Urine retention: Continue intermittent catheterizations as needed.  ua neg, ucx neg    Urecholine trial with positive results so far, check pvr's 15. Constipation: bowel regimen augmented   LOS: 13 days A FACE TO [place] 07/09/2018, 8:35 AM

## 2018-07-09 NOTE — Progress Notes (Addendum)
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 Worsening moderate canal stenosis at L1-2 due to epidural collection. Worsening left L4-5 facet enhancement. Consultation obtained with neurosurgery input as well as infectious disease consulted for ongoing care. Patient currently remains on Ancef as directed.  Agree with above. Have spoken with Dr. Ronnald Ramp whose detailed note is in the chart. Will not pursue any changes in abx at this point. NS and ID need to further discuss tomorrow. Appreciate help and recs from both services.   Meredith Staggers, MD, [facility] 07/09/2018

## 2018-07-09 NOTE — Progress Notes (Signed)
Social Work Patient ID: Kaitlin Vargas, female   DOB: 06-15-1950, 68 y.o.   MRN: [id number]  Met with pt yesterday to review team conference and discuss change in d/c date to 2/22 - pt was agreeable and aware of min assist goals.   Met with her again this afternoon after alerted that new medical concerns due to MRI reports.  PA in with pt and explaining that ID and neurosurgery services have been asked to see her and weigh in on what actions may need to be taken.  She understands that this will likely affected her targeted d/c date and states, "that's fine. I just want this to be taken care of."  Will follow up with her again tomorrow.  Tamas Suen, LCSW

## 2018-07-09 NOTE — Progress Notes (Addendum)
Patient ID: Kaitlin Vargas, female   DOB: 1950-08-29, 68 y.o.   MRN: [id number] Patient seen and examined. See [name]'s note.  I do not believe that surgical intervention is warranted tonight.  It appears her neurologic exam is unchanged based on conversations with her and with Dr. Tessa Lerner.  She has epidural abscess behind the body of L1 ventral to her distal spinal cord.  Unfortunately, this would be a difficult place to access because the lamina here is narrow and the dura is wide and therefore getting completely ventral would be difficult and would put her at risk.  In order to adequately get to that spot one would have to consider facetectomy to get ventrolateral and then this would require instrumented fusion in the face of active infection.  I also do not believe that surgery would necessarily increase exposure of the area to the antibiotics which appear to be failing her.  The ventral abscess may be more phlegmon than liquid pus.  She has moderate stenosis but no cord compression on the MRI.  Given the lack of cord compression and the lack of change in her neurologic exam and the ventral location of the abscess, I think that the risks of surgery outweigh the benefits at this given point in time.  If infectious disease does not want her broaden antibiotics and want to prove that this is no longer MSSA before broadening antibiotics, I would recommend that interventional radiology try to aspirate the disc or the abscess.  I recommend CT scan simply because I think that the infection is going to destroy the disc and potentially the endplates and she may develop kyphosis.  This would then require instrumented fusion.  However, as I stated this would make decompression of the ventral abscess or phlegmon somewhat easier bilateral ventrolateral approach.  She does not have such severe stenosis that I think a simple laminectomy to open the canal posteriorly would offer much benefit if the ventral abscess cannot be  addressed through the same approach.  The difficulty to me is that antibiotics are failing and surgery is likely not the answer acutely as even if I could remove the pus from the epidural space, it will simply come back from the disc space if antibiotics do not start to kill the infection within the disc.  She has progressed despite 3 weeks of IV Ancef, and surgery simply cannot remove all of the infection (the disc itself would not be removed ) and cannot Make Ancef suddenly start to be more effective.  Therefore, I feel like there has to be some change from medical standpoint.  I will speak with Dr. Arnoldo Morale about this patient as he was made aware of her about 3 weeks ago.  He can then also leave recommendations and follow her.

## 2018-07-09 NOTE — Progress Notes (Signed)
      INFECTIOUS DISEASE ATTENDING ADDENDUM:   Date: 07/09/2018  Patient name: Kaitlin Vargas  Medical record number: [id number]  Date of birth: [birth year]   I am [facility] ORDERS for vancomycin and zosyn  There is NO place for MRSA or pseudomonal coverage in person with KNOWN MSSA infection  If we are going to expose pt to HIGH risk of nephrotoxicty of these drugs (esp in combination) we should prove that we need MRSA or pseudomonasl coverage  This type of progression on CORRECT antibiotics is ENTIRELY TYPICAL and I see it ALL the time.  MICROBES are very difficult to treat in bones and juggling the spectrum of antibiotics does not make them "stronger" Ancef and NAF are the STRONGEST abx for this infection  Will need to restart clock on antibiotics   Will see if Neurosurgery wants to intervene but we are not broadening antibiotics   Rhina Brackett [place] 07/09/2018, 5:56 PM

## 2018-07-09 NOTE — Progress Notes (Signed)
Physical Therapy Session Note  Patient Details  Name: Brinly Maietta MRN: [id number] Date of Birth: 05-21-1951  Today's Date: 07/09/2018 PT Missed Time: 67 Minutes Missed Time Reason: Pain  Short Term Goals: Week 2:  PT Short Term Goal 1 (Week 2): = LTG  Skilled Therapeutic Interventions/Progress Updates:    Pt supine in bed upon PT arrival, pt reporting 10/10 pain in low back and reports she does not want to get OOB. Therapist encouraged pt to participate in therapy, pt continues to decline bed level exercises.  Pt missed 60 minutes of skilled therapy tx this session secondary to pain.   Therapy Documentation Precautions:  Precautions Precautions: Fall, Back Precaution Comments: back precautions for comfort Restrictions Weight Bearing Restrictions: No   Therapy/Group: Individual Therapy  Netta Corrigan, PT, DPT 07/09/2018, 7:49 AM

## 2018-07-09 NOTE — Consult Note (Signed)
Reason for Consult: diskitis Referring Physician: Dr. Barbaraann Rondo is an 68 y.o. female.   HPI:  68 year old female who presented originally at the end of January to [facility] with with cough and was treated for adenovirus. On January 27th she presented to the ED again with back pain and difficulty walking.  Imaging showed diskitis at L1-2 which was treated with Ancef. [facility] where [facility] went into cardiac arrest after an IR procedure. She was discharged to rehab. Over the last couple days her back pain has worsened. She has radicular pain in hips and anterolateral thighs, left leg worse than right. She does report some weakness in her legs but has not gotten worse over the last couple weeks. She has had some incontinence as well. She has a very extensive medical history.  MRI was ordered today for worsening back and radicular pain.  Past Medical History:  Diagnosis Date  . Anemia   . Diabetes mellitus without complication ([place])   . Hyperlipidemia   . Hypertension   . Peripheral neuropathy     Past Surgical History:  Procedure Laterality Date  . ABDOMINAL HYSTERECTOMY    . IR LUMBAR DISC ASPIRATION W/IMG GUIDE  06/18/2018    Allergies  Allergen Reactions  . Sulfa Antibiotics Rash    Social History   Tobacco Use  . Smoking status: Never Smoker  . Smokeless tobacco: Never Used  Substance Use Topics  . Alcohol use: Never    Frequency: Never    Family History  Problem Relation Age of Onset  . Cancer Mother   . Stroke Mother   . Cancer Father   . Leukemia Father      Review of Systems  Positive ROS: as above  All other systems have been reviewed and were otherwise negative with the exception of those mentioned in the HPI and as above.  Objective: Vital signs in last 24 hours: Temp:  [98 F (36.7 C)-99.2 F (37.3 C)] 99.2 F (37.3 C) (02/19 1615) Pulse Rate:  [99-114] 99 (02/19 1615) Resp:  [16-19] 16 (02/19 1615) BP: (145-166)/(73-105)  145/73 (02/19 1615) SpO2:  [96 %-100 %] 98 % (02/19 1615)  General Appearance: Alert, cooperative, no distress, appears stated age Head: Normocephalic, without obvious abnormality, atraumatic Eyes: PERRL, conjunctiva/corneas clear, EOM's intact, fundi benign, both eyes      Lungs: respirations unlabored Heart: Regular rate and rhythm  NEUROLOGIC:   Mental status: A&O x4, no aphasia, good attention span, Memory and fund of knowledge Motor Exam - bilateral lower extremities 3/5 Sensory Exam - grossly normal Reflexes: symmetric, no pathologic reflexes, No Hoffman's, No clonus Coordination - grossly normal Gait - unable to test Balance - unable to test Cranial Nerves: I: smell Not tested  II: visual acuity  OS: na    OD: na  II: visual fields Full to confrontation  II: pupils Equal, round, reactive to light  III,VII: ptosis None  III,IV,VI: extraocular muscles  Full ROM  V: mastication   V: facial light touch sensation    V,VII: corneal reflex    VII: facial muscle function - upper    VII: facial muscle function - lower   VIII: hearing   IX: soft palate elevation    IX,X: gag reflex   XI: trapezius strength    XI: sternocleidomastoid strength   XI: neck flexion strength    XII: tongue strength      Data Review Lab Results  Component Value Date  WBC 4.6 07/08/2018   HGB 10.0 (L) 07/08/2018   HCT 32.5 (L) 07/08/2018   MCV 102.2 (H) 07/08/2018   PLT 269 07/08/2018   Lab Results  Component Value Date   NA 135 07/08/2018   K 3.5 07/08/2018   CL 97 (L) 07/08/2018   CO2 29 07/08/2018   BUN 15 07/08/2018   CREATININE 0.60 07/08/2018   GLUCOSE 108 (H) 07/08/2018   Lab Results  Component Value Date   INR 1.04 06/17/2018    Radiology: Vas [place] Lower Extremity Venous (dvt)  Result Date: 06/27/2018  Lower Venous Study Indications: Edema.  Performing Technologist: Toma Copier RVS  Examination Guidelines: A complete evaluation includes B-mode imaging, spectral  Doppler, color Doppler, and power Doppler as needed of all accessible portions of each vessel. Bilateral testing is considered an integral part of a complete examination. Limited examinations for reoccurring indications may be performed as noted.  Right Venous Findings: +---------+---------------+---------+-----------+----------+-------------------+          CompressibilityPhasicitySpontaneityPropertiesSummary             +---------+---------------+---------+-----------+----------+-------------------+ CFV      Full                                                             +---------+---------------+---------+-----------+----------+-------------------+ SFJ      Full                                                             +---------+---------------+---------+-----------+----------+-------------------+ FV Prox  Full                                                             +---------+---------------+---------+-----------+----------+-------------------+ FV Mid   Full                                                             +---------+---------------+---------+-----------+----------+-------------------+ FV DistalFull                                                             +---------+---------------+---------+-----------+----------+-------------------+ PFV      Full                                                             +---------+---------------+---------+-----------+----------+-------------------+ POP      Full                                                             +---------+---------------+---------+-----------+----------+-------------------+  PTV      Full                                                             +---------+---------------+---------+-----------+----------+-------------------+ PERO                                                  Difficult to image                                                         due to patient                                                            beimng unale to                                                           rotate the leg for                                                        optimal imaging     +---------+---------------+---------+-----------+----------+-------------------+  Right Technical Findings: Not visualized segments include Peroneal. See summary comments above  Left Venous Findings: +---------+---------------+---------+-----------+----------+-------------------+          CompressibilityPhasicitySpontaneityPropertiesSummary             +---------+---------------+---------+-----------+----------+-------------------+ CFV      Full           Yes      Yes                                      +---------+---------------+---------+-----------+----------+-------------------+ SFJ      Full                                                             +---------+---------------+---------+-----------+----------+-------------------+ FV Prox  Full           Yes      Yes                                      +---------+---------------+---------+-----------+----------+-------------------+ FV Mid  Full                                                             +---------+---------------+---------+-----------+----------+-------------------+ FV DistalFull           Yes      Yes                                      +---------+---------------+---------+-----------+----------+-------------------+ PFV      Full           Yes      Yes                                      +---------+---------------+---------+-----------+----------+-------------------+ POP      Full           Yes      Yes                                      +---------+---------------+---------+-----------+----------+-------------------+ PTV      Full                                                              +---------+---------------+---------+-----------+----------+-------------------+ PERO                                                  Color flow appears                                                        normal however                                                            difficult to image                                                        due to edema        +---------+---------------+---------+-----------+----------+-------------------+  Left Technical Findings: Not visualized segments include limited peroneal due to edema. See summary comments listed above   Summary: Right: There is no evidence of deep vein thrombosis in the lower extremity. However, portions of this examination were limited- see technologist comments above. No cystic structure found in  the popliteal fossa. Left: There is no evidence of deep vein thrombosis in the lower extremity. However, portions of this examination were limited- see technologist comments above. No cystic structure found in the popliteal fossa.  *See table(s) above for measurements and observations. Electronically signed by Monica Martinez MD on 06/27/2018 at 3:48:45 PM.    Final      Assessment/Plan: 68 year old with an extensive medical history who has been in rehab for several weeks now. With her worsening back pain an MRI was ordered which showed worsening diskitis at L1-2 and a large epidural abscess that extends from L1-L2 up to T12 causing moderate spinal stenosis. I do believe that this is a failure of medical therapy with antibiotics. I would recommend switching her to vanc rifampin and zosyn for better coverage. According to MD, the weakness in her legs has not gotten any worse. I do not think that surgery would be in her best interest right now as the epidural abscess is very ventral and would be difficult to release without causing further harm. I will order a CT of her lumbar spine to further assess the disc space and  vertebral bodies. Would recommend medicine to admit to the hospital. She may need a fusion in the future but for now I think that treating the infection with appropriate antibiotics is the first step. Dr. Arnoldo Morale was consulted on January 27th. Will make sure that he is made aware of patient.   Ocie Cornfield [facility] 07/09/2018 5:36 PM

## 2018-07-09 NOTE — Progress Notes (Signed)
ID Progress Note:   Alerted by Linna Hoff, PA with rehab team about the change in Kaitlin Vargas's status with regards to her back pain that has been increasing. Repeat MRI noted with ongoing/worsening evidence of discitis and now mention of epidural abscess (this is has evolved from prior scan 3 weeks ago).   She has no neurologic compromise per report. Suspect this is the same MSSA she has been treated for. Would continue cefazolin as eshe has been getting 2g IV q8h and re-consult neurosurgery. She is now 3 weeks into an 8 week course of therapy for MSSA discitis and may be failing medical therapy and require surgical intervention.   D/W Dr. Tommy Medal - will see patient tomorrow AM formally.   Kaitlin Madeira, MSN, NP-C [facility] [email or web address] Pager: [phone] Office: [phone]

## 2018-07-09 NOTE — Progress Notes (Signed)
Chaplain responded to spiritual care request. Patient desires baptism.  Chaplain knocked on door and someone responded "Patient care."  Chaplain will return later this morning. Tamsen Snider Pager [phone]

## 2018-07-09 NOTE — Progress Notes (Signed)
Chaplain returned when patient was available.  Accompanied by another chaplain, the sacrament of baptism was given to patient per her request.  Will be available if requested. Tamsen Snider Pager [phone]

## 2018-07-09 NOTE — Progress Notes (Signed)
Occupational Therapy Session Note  Patient Details  Name: Kaitlin Vargas MRN: [id number] Date of Birth: April 09, 1951  Today's Date: 07/09/2018 OT Individual Time: 1194-1740 OT Individual Time Calculation (min): 35 min    Short Term Goals: Week 1:  OT Short Term Goal 1 (Week 1): Pt will tolerate standing for 2 mins with min A to increase endurance for BADL tasks OT Short Term Goal 1 - Progress (Week 1): Progressing toward goal OT Short Term Goal 2 (Week 1): Pt will complete 1 step of LB dressing task OT Short Term Goal 2 - Progress (Week 1): Met OT Short Term Goal 3 (Week 1): Pt will complete stand-piovt toilet transfer with mod A OT Short Term Goal 3 - Progress (Week 1): Met OT Short Term Goal 4 (Week 1): Pt will complete sit<>stand with min A OT Short Term Goal 4 - Progress (Week 1): Progressing toward goal  Skilled Therapeutic Interventions/Progress Updates:    1:1. Pt received in bed reporting 10/10 pain in back radiating down to LLE. Pt reporting wanting to use bathroom and wash up. Attempted to have pt stand at [facility], however despite MAX A pt unable to assume upright positioning at RW and OT safely assists pt back to sitting. Pt completes sit to stand with MOD A in stedy from elevated bed EOB<>toilet with MAX VC for upright posture and hip extension.  OT provides total A for hygiene and peri care as pt with small BM smear on rag. Pt dons pants in bed rolling B with VC for sequencing log rolling for pain relief. Exited session with heat applied to L hip and pt in bed. Pt missed 25 min d/t pain and fatigue. Exited session with pt seated in bed, call light in reach and all needs met  Therapy Documentation Precautions:  Precautions Precautions: Fall, Back Precaution Comments: back precautions for comfort Restrictions Weight Bearing Restrictions: No General:   Vital Signs:  Pain: Pain Assessment Pain Scale: 0-10 Pain Score: 10-Worst pain ever Pain Type: Chronic pain Pain Location:  Back Pain Orientation: Lower Pain Radiating Towards: legs Pain Descriptors / Indicators: Aching;Dull Pain Frequency: Constant Pain Onset: On-going Pain Intervention(s): Medication (See eMAR);Repositioned  Therapy/Group: Individual Therapy  Tonny Branch 07/09/2018, 10:00 AM

## 2018-07-09 NOTE — Patient Care Conference (Signed)
Inpatient RehabilitationTeam Conference and Plan of Care Update Date: 07/08/2018   Time: 2:30 PM    Patient Name: Kaitlin Vargas      Medical Record Number: [id number]  Date of Birth: Oct 05, 1950 Sex: Female         Room/Bed: 4W07C/4W07C-01 Payor Info: Payor: [employer] MEDICARE / Plan: [place] HMO / Product Type: *No Product type* /    Admitting Diagnosis: lumbar osteo  Admit Date/Time:  06/26/2018  3:22 PM Admission Comments: No comment available   Primary Diagnosis:  Lumbar discitis Principal Problem: Lumbar discitis  Patient Active Problem List   Diagnosis Date Noted  . Sinus tachycardia   . Hyponatremia   . Labile blood glucose   . Acute on chronic anemia   . Bacteremia   . Lumbar discitis 06/26/2018  . Cardiac arrest, cause unspecified ([place])   . Acute encephalopathy   . MSSA bacteremia 06/17/2018  . 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 DKA (diabetic ketoacidosis) ([place]) 06/16/2018  . Diabetic foot ulcer ([place]) 06/16/2018  . Acute hypoxemic respiratory failure ([place]) 06/07/2018  . Essential hypertension 06/07/2018  . Type 2 diabetes mellitus with hyperlipidemia ([place]) 06/07/2018  . Anemia 06/07/2018  . Acute bronchitis 06/07/2018    Expected Discharge Date: Expected Discharge Date: 07/12/18  Team Members Present: Physician leading conference: Dr. Alger Simons Social Worker Present: Lennart Pall, LCSW Nurse Present: Rayetta Pigg, RN PT Present: Roderic Ovens, PT OT Present: Cherylynn Ridges, OT SLP Present: Weston Anna, SLP PPS Coordinator present : Gunnar Fusi     Current Status/Progress Goal Weekly Team Focus  Medical   ongoing pain, urine retention, constipation, right foot wound healing, diabetes control inconsistent  pain control  pain, ongoing diabetic, wound care considerations, nutrition   Bowel/Bladder   inconitneent of bowel/bladder. I&O cath q 8hrs PRN  To be able to have normal bowel/bladder perttern with mod assist  Timed toileting q 2hrs while awake    Swallow/Nutrition/ Hydration             ADL's   Mod/max A BADL tasks, Mod A transfers/stadning 2/2 high pain  Goals to be downgraded to min A  activity tolerance, transfers, pain management, modified bathing/dressing   Mobility   min/mod A sit <> stand, unable to perform gait due to pain/cramping/fatigue  downgraded to min A  family ed, d/c planning, strengthening   Communication             Safety/Cognition/ Behavioral Observations            Pain   Complains of lower back pain, Norco PRN, MS contin and Robaxin on-board  <2  Assess and treat pain q shift and as needed   Skin   Wound to right foot, rash to BLE, MASD to groin  Maintain skin integrity with mod assist   Assess skin q shift and PRN    Rehab Goals Patient on target to meet rehab goals: Yes *See Care Plan and progress notes for long and short-term goals.     Barriers to Discharge  Current Status/Progress Possible Resolutions Date Resolved   Physician    Medical stability        see medical problem list      Nursing                  PT                    OT  SLP                SW                Discharge Planning/Teaching Needs:  Home with spouse and family who can provide 24/7 support.    Hands on education begun today with spouse.   Team Discussion:  Pain issues continue but maybe slightly better today?  Some incontinence still but cont with bladder today.  Mod assist OOB;  Min assist with ADLs.  Most goals downgraded to min assist with ADLs and mobility.  Review of DME and HH needs.  Revisions to Treatment Plan:  Some goals downgraded to min assist.    Continued Need for Acute Rehabilitation Level of Care: The patient requires daily medical management by a physician with specialized training in physical medicine and rehabilitation for the following conditions: Daily direction of a multidisciplinary physical rehabilitation program to ensure safe treatment while eliciting the highest  outcome that is of practical value to the patient.: Yes Daily medical management of patient stability for increased activity during participation in an intensive rehabilitation regime.: Yes Daily analysis of laboratory values and/or radiology reports with any subsequent need for medication adjustment of medical intervention for : Wound care problems;Neurological problems;Diabetes problems   I attest that I was present, lead the team conference, and concur with the assessment and plan of the team.   [name] 07/08/2018, 4:09 PM

## 2018-07-09 NOTE — Progress Notes (Signed)
Occupational Therapy Session Note  Patient Details  Name: Ninoshka Wainwright MRN: [id number] Date of Birth: 1950-12-23  Today's Date: 07/09/2018 OT Missed Time: 55 Minutes Missed Time Reason: Pain;Patient fatigue;Patient ill (comment)   Short Term Goals: Week 1:  OT Short Term Goal 1 (Week 1): Pt will tolerate standing for 2 mins with min A to increase endurance for BADL tasks OT Short Term Goal 1 - Progress (Week 1): Progressing toward goal OT Short Term Goal 2 (Week 1): Pt will complete 1 step of LB dressing task OT Short Term Goal 2 - Progress (Week 1): Met OT Short Term Goal 3 (Week 1): Pt will complete stand-piovt toilet transfer with mod A OT Short Term Goal 3 - Progress (Week 1): Met OT Short Term Goal 4 (Week 1): Pt will complete sit<>stand with min A OT Short Term Goal 4 - Progress (Week 1): Progressing toward goal Week 2:  OT Short Term Goal 1 (Week 2): Pt will tolerate standing for 2 mins with min A to increase endurance for BADL tasks OT Short Term Goal 2 (Week 2): Pt will complete sit<>stand with consistent min A OT Short Term Goal 3 (Week 2): Pt will be able to thread B LEs into pant legs using ADL AE and min verbal cues  Skilled Therapeutic Interventions/Progress Updates:    Pt received in bed stating she was not feeling well and had just gone through cathing and is waiting for a CT scan.  Pt's husband by her side. She stated she could not participate now.    Therapy Documentation Precautions:  Precautions Precautions: Fall, Back Precaution Comments: back precautions for comfort Restrictions Weight Bearing Restrictions: No   Pain: Pain Assessment Pain Scale: 0-10 Pain Score: 10-Worst pain ever Pain Type: Chronic pain Pain Location: Back Pain Orientation: Lower Pain Radiating Towards: legs Pain Descriptors / Indicators: Aching;Dull Pain Frequency: Constant Pain Onset: On-going Pain Intervention(s): Medication (See eMAR);Repositioned   ADL: ADL Eating: Set  up Grooming: Setup Upper Body Bathing: Minimal assistance Lower Body Bathing: Maximal assistance Upper Body Dressing: Minimal assistance Lower Body Dressing: Dependent Toileting: Not assessed Toilet Transfer: Maximal assistance Toilet Transfer Method: Stand pivot   Therapy/Group: Individual Therapy  [place] 07/09/2018, 10:06 AM

## 2018-07-09 NOTE — Progress Notes (Signed)
Occupational Therapy Note  Patient Details  Name: Kaitlin Vargas MRN: [id number] Date of Birth: 11/13/1950  Today's Date: 07/09/2018 OT Missed Time: 72 Minutes Missed Time Reason: CT/MRI  Pt missed 60 min skilled OT as pt is off unit receiving MRI. Will follow up per POC to make up missed time.   Lowella Dell Brenten Janney 07/09/2018, 2:15 PM

## 2018-07-10 ENCOUNTER — Inpatient Hospital Stay (HOSPITAL_COMMUNITY): Payer: Medicare HMO | Admitting: Occupational Therapy

## 2018-07-10 ENCOUNTER — Inpatient Hospital Stay (HOSPITAL_COMMUNITY)
Admission: AD | Admit: 2018-07-10 | Discharge: 2018-07-16 | DRG: 028 | Disposition: A | Discharge status: Rehabilitation Facility | Payer: Medicare HMO | Source: Ambulatory Visit | Attending: Family Medicine | Admitting: Family Medicine

## 2018-07-10 ENCOUNTER — Inpatient Hospital Stay (HOSPITAL_COMMUNITY): Payer: Medicare HMO | Admitting: Physical Therapy

## 2018-07-10 ENCOUNTER — Inpatient Hospital Stay (HOSPITAL_COMMUNITY): Payer: Medicare HMO

## 2018-07-10 ENCOUNTER — Other Ambulatory Visit: Payer: Self-pay | Admitting: Neurological Surgery

## 2018-07-10 DIAGNOSIS — D649 Anemia, unspecified: Secondary | ICD-10-CM | POA: Diagnosis not present

## 2018-07-10 DIAGNOSIS — I358 Other nonrheumatic aortic valve disorders: Secondary | ICD-10-CM | POA: Diagnosis present

## 2018-07-10 DIAGNOSIS — E538 Deficiency of other specified B group vitamins: Secondary | ICD-10-CM | POA: Diagnosis present

## 2018-07-10 DIAGNOSIS — R339 Retention of urine, unspecified: Secondary | ICD-10-CM | POA: Diagnosis not present

## 2018-07-10 DIAGNOSIS — E1165 Type 2 diabetes mellitus with hyperglycemia: Secondary | ICD-10-CM | POA: Diagnosis present

## 2018-07-10 DIAGNOSIS — L97519 Non-pressure chronic ulcer of other part of right foot with unspecified severity: Secondary | ICD-10-CM | POA: Diagnosis present

## 2018-07-10 DIAGNOSIS — I339 Acute and subacute endocarditis, unspecified: Secondary | ICD-10-CM | POA: Diagnosis not present

## 2018-07-10 DIAGNOSIS — I10 Essential (primary) hypertension: Secondary | ICD-10-CM | POA: Diagnosis present

## 2018-07-10 DIAGNOSIS — E1142 Type 2 diabetes mellitus with diabetic polyneuropathy: Secondary | ICD-10-CM

## 2018-07-10 DIAGNOSIS — M48061 Spinal stenosis, lumbar region without neurogenic claudication: Secondary | ICD-10-CM

## 2018-07-10 DIAGNOSIS — E1169 Type 2 diabetes mellitus with other specified complication: Secondary | ICD-10-CM | POA: Diagnosis present

## 2018-07-10 DIAGNOSIS — G061 Intraspinal abscess and granuloma: Secondary | ICD-10-CM

## 2018-07-10 DIAGNOSIS — R000 Tachycardia, unspecified: Secondary | ICD-10-CM | POA: Diagnosis not present

## 2018-07-10 DIAGNOSIS — E11621 Type 2 diabetes mellitus with foot ulcer: Secondary | ICD-10-CM | POA: Diagnosis present

## 2018-07-10 DIAGNOSIS — M4646 Discitis, unspecified, lumbar region: Secondary | ICD-10-CM | POA: Diagnosis present

## 2018-07-10 DIAGNOSIS — Z7982 Long term (current) use of aspirin: Secondary | ICD-10-CM

## 2018-07-10 DIAGNOSIS — Z981 Arthrodesis status: Secondary | ICD-10-CM | POA: Diagnosis not present

## 2018-07-10 DIAGNOSIS — B9561 Methicillin susceptible Staphylococcus aureus infection as the cause of diseases classified elsewhere: Secondary | ICD-10-CM | POA: Diagnosis present

## 2018-07-10 DIAGNOSIS — Z4789 Encounter for other orthopedic aftercare: Secondary | ICD-10-CM | POA: Diagnosis not present

## 2018-07-10 DIAGNOSIS — I340 Nonrheumatic mitral (valve) insufficiency: Secondary | ICD-10-CM | POA: Diagnosis not present

## 2018-07-10 DIAGNOSIS — E08621 Diabetes mellitus due to underlying condition with foot ulcer: Secondary | ICD-10-CM | POA: Diagnosis not present

## 2018-07-10 DIAGNOSIS — Z79899 Other long term (current) drug therapy: Secondary | ICD-10-CM

## 2018-07-10 DIAGNOSIS — E785 Hyperlipidemia, unspecified: Secondary | ICD-10-CM | POA: Diagnosis present

## 2018-07-10 DIAGNOSIS — Z419 Encounter for procedure for purposes other than remedying health state, unspecified: Secondary | ICD-10-CM

## 2018-07-10 DIAGNOSIS — G8929 Other chronic pain: Secondary | ICD-10-CM | POA: Diagnosis present

## 2018-07-10 DIAGNOSIS — K5903 Drug induced constipation: Secondary | ICD-10-CM | POA: Diagnosis not present

## 2018-07-10 DIAGNOSIS — Z882 Allergy status to sulfonamides status: Secondary | ICD-10-CM | POA: Diagnosis not present

## 2018-07-10 DIAGNOSIS — M4626 Osteomyelitis of vertebra, lumbar region: Secondary | ICD-10-CM | POA: Diagnosis present

## 2018-07-10 DIAGNOSIS — G062 Extradural and subdural abscess, unspecified: Secondary | ICD-10-CM | POA: Diagnosis not present

## 2018-07-10 DIAGNOSIS — D638 Anemia in other chronic diseases classified elsewhere: Secondary | ICD-10-CM | POA: Diagnosis present

## 2018-07-10 DIAGNOSIS — R195 Other fecal abnormalities: Secondary | ICD-10-CM | POA: Diagnosis not present

## 2018-07-10 DIAGNOSIS — L97509 Non-pressure chronic ulcer of other part of unspecified foot with unspecified severity: Secondary | ICD-10-CM

## 2018-07-10 DIAGNOSIS — E46 Unspecified protein-calorie malnutrition: Secondary | ICD-10-CM | POA: Diagnosis not present

## 2018-07-10 DIAGNOSIS — Z90710 Acquired absence of both cervix and uterus: Secondary | ICD-10-CM | POA: Diagnosis not present

## 2018-07-10 DIAGNOSIS — K219 Gastro-esophageal reflux disease without esophagitis: Secondary | ICD-10-CM | POA: Diagnosis present

## 2018-07-10 DIAGNOSIS — D62 Acute posthemorrhagic anemia: Secondary | ICD-10-CM | POA: Diagnosis not present

## 2018-07-10 DIAGNOSIS — E871 Hypo-osmolality and hyponatremia: Secondary | ICD-10-CM | POA: Diagnosis present

## 2018-07-10 DIAGNOSIS — B9689 Other specified bacterial agents as the cause of diseases classified elsewhere: Secondary | ICD-10-CM | POA: Diagnosis present

## 2018-07-10 DIAGNOSIS — Z95828 Presence of other vascular implants and grafts: Secondary | ICD-10-CM | POA: Diagnosis not present

## 2018-07-10 DIAGNOSIS — Z794 Long term (current) use of insulin: Secondary | ICD-10-CM

## 2018-07-10 DIAGNOSIS — E119 Type 2 diabetes mellitus without complications: Secondary | ICD-10-CM | POA: Diagnosis not present

## 2018-07-10 DIAGNOSIS — R7881 Bacteremia: Secondary | ICD-10-CM | POA: Diagnosis present

## 2018-07-10 DIAGNOSIS — I330 Acute and subacute infective endocarditis: Secondary | ICD-10-CM | POA: Diagnosis present

## 2018-07-10 DIAGNOSIS — I350 Nonrheumatic aortic (valve) stenosis: Secondary | ICD-10-CM | POA: Diagnosis not present

## 2018-07-10 DIAGNOSIS — G8918 Other acute postprocedural pain: Secondary | ICD-10-CM

## 2018-07-10 DIAGNOSIS — E669 Obesity, unspecified: Secondary | ICD-10-CM | POA: Diagnosis not present

## 2018-07-10 DIAGNOSIS — R7309 Other abnormal glucose: Secondary | ICD-10-CM | POA: Diagnosis not present

## 2018-07-10 LAB — GLUCOSE, CAPILLARY
Glucose-Capillary: 35 mg/dL — CL (ref 70–99)
Glucose-Capillary: 84 mg/dL (ref 70–99)
Glucose-Capillary: 87 mg/dL (ref 70–99)
Glucose-Capillary: 171 mg/dL — ABNORMAL HIGH (ref 70–99)
Glucose-Capillary: 197 mg/dL — ABNORMAL HIGH (ref 70–99)

## 2018-07-10 MED ORDER — METHOCARBAMOL 750 MG PO TABS
750.0000 mg | ORAL_TABLET | Freq: Four times a day (QID) | ORAL | Status: DC
  Administered 2018-07-10 – 2018-07-11 (×3): 750 mg via ORAL
  Filled 2018-07-10 (×3): qty 1

## 2018-07-10 MED ORDER — SENNOSIDES-DOCUSATE SODIUM 8.6-50 MG PO TABS: 2.0000 | ORAL_TABLET | Freq: Every day | ORAL | Status: AC

## 2018-07-10 MED ORDER — ALBUTEROL SULFATE (2.5 MG/3ML) 0.083% IN NEBU: 2.5000 mg | INHALATION_SOLUTION | RESPIRATORY_TRACT | Status: DC | PRN

## 2018-07-10 MED ORDER — CEFAZOLIN SODIUM-DEXTROSE 2-4 GM/100ML-% IV SOLN
2.0000 g | Freq: Three times a day (TID) | INTRAVENOUS | Status: DC
  Administered 2018-07-10 – 2018-07-13 (×7): 2 g via INTRAVENOUS
  Filled 2018-07-10 (×10): qty 100

## 2018-07-10 MED ORDER — LISINOPRIL 10 MG PO TABS
10.0000 mg | ORAL_TABLET | Freq: Every day | ORAL | Status: DC
  Administered 2018-07-12 – 2018-07-16 (×5): 10 mg via ORAL
  Filled 2018-07-10 (×5): qty 1

## 2018-07-10 MED ORDER — INSULIN ASPART 100 UNIT/ML ~~LOC~~ SOLN
0.0000 [IU] | Freq: Three times a day (TID) | SUBCUTANEOUS | Status: DC
  Administered 2018-07-10 – 2018-07-11 (×2): 2 [IU] via SUBCUTANEOUS

## 2018-07-10 MED ORDER — PRO-STAT SUGAR FREE PO LIQD
30.0000 mL | Freq: Two times a day (BID) | ORAL | Status: DC
  Administered 2018-07-10 – 2018-07-16 (×11): 30 mL via ORAL
  Filled 2018-07-10 (×11): qty 30

## 2018-07-10 MED ORDER — BETHANECHOL CHLORIDE 10 MG PO TABS: 10.0000 mg | ORAL_TABLET | Freq: Three times a day (TID) | ORAL | Status: DC

## 2018-07-10 MED ORDER — HEPARIN SODIUM (PORCINE) 5000 UNIT/ML IJ SOLN
5000.0000 [IU] | Freq: Three times a day (TID) | INTRAMUSCULAR | Status: DC
  Administered 2018-07-10: 5000 [IU] via SUBCUTANEOUS
  Filled 2018-07-10: qty 1

## 2018-07-10 MED ORDER — ASPIRIN 81 MG PO CHEW: 81.0000 mg | CHEWABLE_TABLET | Freq: Every day | ORAL | Status: DC

## 2018-07-10 MED ORDER — ALBUTEROL SULFATE (2.5 MG/3ML) 0.083% IN NEBU: 2.5000 mg | INHALATION_SOLUTION | RESPIRATORY_TRACT | 12 refills | Status: DC | PRN

## 2018-07-10 MED ORDER — GLUCERNA SHAKE PO LIQD
237.0000 mL | Freq: Three times a day (TID) | ORAL | Status: DC
  Administered 2018-07-10 – 2018-07-16 (×11): 237 mL via ORAL

## 2018-07-10 MED ORDER — PANTOPRAZOLE SODIUM 40 MG PO TBEC: 40.0000 mg | DELAYED_RELEASE_TABLET | Freq: Every day | ORAL | Status: DC

## 2018-07-10 MED ORDER — KETOCONAZOLE 2 % EX CREA
TOPICAL_CREAM | Freq: Two times a day (BID) | CUTANEOUS | Status: DC
  Administered 2018-07-10: 21:00:00 via TOPICAL
  Administered 2018-07-11: 1 via TOPICAL
  Administered 2018-07-12 – 2018-07-16 (×8): via TOPICAL
  Filled 2018-07-10: qty 15

## 2018-07-10 MED ORDER — BISACODYL 10 MG RE SUPP: 10.0000 mg | Freq: Every day | RECTAL | Status: DC | PRN

## 2018-07-10 MED ORDER — HEPARIN SODIUM (PORCINE) 5000 UNIT/ML IJ SOLN: 5000.0000 [IU] | Freq: Three times a day (TID) | INTRAMUSCULAR | Status: DC

## 2018-07-10 MED ORDER — ADULT MULTIVITAMIN W/MINERALS CH
1.0000 | ORAL_TABLET | Freq: Every day | ORAL | Status: DC
  Administered 2018-07-12 – 2018-07-16 (×5): 1 via ORAL
  Filled 2018-07-10 (×5): qty 1

## 2018-07-10 MED ORDER — METHOCARBAMOL 750 MG PO TABS: 750.0000 mg | ORAL_TABLET | Freq: Four times a day (QID) | ORAL | Status: DC

## 2018-07-10 MED ORDER — KETOCONAZOLE 2 % EX CREA: TOPICAL_CREAM | Freq: Two times a day (BID) | CUTANEOUS | 0 refills | Status: DC

## 2018-07-10 MED ORDER — SODIUM CHLORIDE 0.9% FLUSH
10.0000 mL | Freq: Two times a day (BID) | INTRAVENOUS | Status: DC
  Administered 2018-07-10 – 2018-07-16 (×8): 10 mL

## 2018-07-10 MED ORDER — SODIUM CHLORIDE 0.9% FLUSH
10.0000 mL | INTRAVENOUS | Status: DC | PRN
  Administered 2018-07-16: 20 mL
  Filled 2018-07-10: qty 40

## 2018-07-10 MED ORDER — INSULIN GLARGINE 100 UNIT/ML ~~LOC~~ SOLN
20.0000 [IU] | Freq: Every day | SUBCUTANEOUS | Status: DC
  Filled 2018-07-10: qty 0.2

## 2018-07-10 MED ORDER — FUROSEMIDE 40 MG PO TABS: 40.0000 mg | ORAL_TABLET | Freq: Every day | ORAL | Status: DC

## 2018-07-10 MED ORDER — HYDROCODONE-ACETAMINOPHEN 10-325 MG PO TABS: 1.0000 | ORAL_TABLET | ORAL | Status: DC | PRN

## 2018-07-10 MED ORDER — SORBITOL 70 % SOLN: 30.0000 mL | Freq: Every day | Status: DC | PRN

## 2018-07-10 MED ORDER — VITAMIN B-12 1000 MCG PO TABS: 1000.0000 ug | ORAL_TABLET | Freq: Every day | ORAL | Status: DC

## 2018-07-10 MED ORDER — INSULIN ASPART 100 UNIT/ML ~~LOC~~ SOLN: 0.0000 [IU] | Freq: Three times a day (TID) | SUBCUTANEOUS | 11 refills | Status: DC

## 2018-07-10 MED ORDER — MORPHINE SULFATE ER 15 MG PO TBCR
30.0000 mg | EXTENDED_RELEASE_TABLET | Freq: Two times a day (BID) | ORAL | Status: DC
  Administered 2018-07-10 – 2018-07-12 (×4): 30 mg via ORAL
  Filled 2018-07-10 (×4): qty 2

## 2018-07-10 MED ORDER — INSULIN GLARGINE 100 UNIT/ML ~~LOC~~ SOLN: 20.0000 [IU] | Freq: Every day | SUBCUTANEOUS | 11 refills | Status: DC

## 2018-07-10 MED ORDER — MORPHINE SULFATE ER 30 MG PO TBCR: 30.0000 mg | EXTENDED_RELEASE_TABLET | Freq: Two times a day (BID) | ORAL | 0 refills | Status: DC

## 2018-07-10 MED ORDER — VITAMIN B-12 1000 MCG PO TABS: 1000.0000 ug | ORAL_TABLET | Freq: Every day | ORAL | 0 refills | Status: AC

## 2018-07-10 MED ORDER — BISACODYL 10 MG RE SUPP: 10.0000 mg | Freq: Every day | RECTAL | 0 refills | Status: DC | PRN

## 2018-07-10 MED ORDER — METOPROLOL TARTRATE 25 MG PO TABS
25.0000 mg | ORAL_TABLET | Freq: Two times a day (BID) | ORAL | Status: DC
  Administered 2018-07-10 – 2018-07-11 (×2): 25 mg via ORAL
  Filled 2018-07-10: qty 1
  Filled 2018-07-10: qty 2
  Filled 2018-07-10: qty 1

## 2018-07-10 MED ORDER — FLEET ENEMA 7-19 GM/118ML RE ENEM
1.0000 | ENEMA | Freq: Every day | RECTAL | Status: DC | PRN
  Administered 2018-07-16: 1 via RECTAL
  Filled 2018-07-10: qty 1

## 2018-07-10 MED ORDER — SENNOSIDES-DOCUSATE SODIUM 8.6-50 MG PO TABS
2.0000 | ORAL_TABLET | Freq: Every day | ORAL | Status: DC
  Administered 2018-07-10 – 2018-07-11 (×2): 2 via ORAL
  Filled 2018-07-10 (×2): qty 2

## 2018-07-10 MED ORDER — BETHANECHOL CHLORIDE 10 MG PO TABS
10.0000 mg | ORAL_TABLET | Freq: Three times a day (TID) | ORAL | Status: DC
  Administered 2018-07-10 – 2018-07-11 (×3): 10 mg via ORAL
  Filled 2018-07-10 (×3): qty 1

## 2018-07-10 MED ORDER — GADOBUTROL 1 MMOL/ML IV SOLN
7.5000 mL | Freq: Once | INTRAVENOUS | Status: AC | PRN
  Administered 2018-07-10: 7.5 mL via INTRAVENOUS

## 2018-07-10 NOTE — Progress Notes (Signed)
Physical Therapy Session Note  Patient Details  Name: Samanth Mirkin MRN: [id number] Date of Birth: 22-Jan-1951    Skilled Therapeutic Interventions/Progress Updates:    pt missed 60 minutes skilled PT due to off unit for MRI  Therapy Documentation Precautions:  Precautions Precautions: Fall, Back Precaution Comments: back precautions for comfort Restrictions Weight Bearing Restrictions: No General: PT Amount of Missed Time (min): 60 Minutes PT Missed Treatment Reason: CT/MRI   Jeronimo Hellberg 07/10/2018, 9:39 AM

## 2018-07-10 NOTE — Progress Notes (Signed)
Occupational Therapy Session Note  Patient Details  Name: Shenika Quint MRN: [id number] Date of Birth: [birth year]  Today's Date: 07/10/2018 OT Individual Time: 7948-0165 OT Individual Time Calculation (min): 10 min  and Today's Date: 07/10/2018 OT Missed Time: 53 Minutes Missed Time Reason: Pain;Other (comment)(MD in room discussed POC)  Short Term Goals: Week 2:  OT Short Term Goal 1 (Week 2): Pt will tolerate standing for 2 mins with min A to increase endurance for BADL tasks OT Short Term Goal 2 (Week 2): Pt will complete sit<>stand with consistent min A OT Short Term Goal 3 (Week 2): Pt will be able to thread B LEs into pant legs using ADL AE and min verbal cues  Skilled Therapeutic Interventions/Progress Updates:    Pt greeted semi-reclined in bed. Pt was able to eat breakfast and states she would like to get out of bed, but needed pain medication for 9/10 pain before attempting. Nursing entered to administer medications. Discussed POC with pt as far as therapy goals, then another MD entered room to discuss medical POC. OT to follow up pending medical POC.   Therapy Documentation Precautions:  Precautions Precautions: Fall, Back Precaution Comments: back precautions for comfort Restrictions Weight Bearing Restrictions: No General: General OT Amount of Missed Time: 50 Minutes Pain: Pain Assessment Pain Scale: 0-10 Pain Score: 9 Pain Type: Chronic pain Pain Location: Back Pain Orientation: Lower Pain Radiating Towards: leg Pain Descriptors / Indicators: Discomfort Pain Frequency: Constant Pain Onset: On-going Pain Intervention(s): Nursing administered pain medication   Therapy/Group: Individual Therapy  Valma Cava 07/10/2018, 12:33 PM

## 2018-07-10 NOTE — Significant Event (Signed)
Hypoglycemic Event  CBG: 47  Treatment: 8 oz juice/soda  Symptoms: None  Follow-up CBG: Time:2154 CBG Result:83  Possible Reasons for Event: Medication Regimen  Comments/MD notified: Hypoglycemia protocol followed. Pt was asymptomatic. Rechecked blood glucose 15 minutes after the completion of 8 oz soda. Reesa Chew PA-C notified and stated to give patient bedtime snack. Bedtime snack administered.     Jacquelynn Cree Alysiana Ethridge

## 2018-07-10 NOTE — Progress Notes (Signed)
Inpatient Rehabilitation Admissions Coordinator  Patient readmitted to acute from CIR. Admitted to CIR 06/26/2018. I will follow her progress to assist.  Danne Baxter, RN, MSN Rehab Admissions Coordinator [phone] 07/10/2018 2:39 PM

## 2018-07-10 NOTE — Progress Notes (Addendum)
[place] PHYSICAL MEDICINE & REHABILITATION PROGRESS NOTE   Subjective/Complaints: After feeling better yesterday and having a good day bowel reports patient stating this morning that her pain is worse than ever and that we are not doing anything to help her.  And episodes of hypoglycemia after treating hyperglycemia at lunch.  Patient reports cramps in both her legs and pain in the legs and back.  ROS: Patient denies fever, rash, sore throat, blurred vision, nausea, vomiting, diarrhea, cough, shortness of breath or chest pain,  headache, or mood change.   Objective:   Ct Lumbar Spine Wo Contrast  Result Date: 07/09/2018 CLINICAL DATA:  Low back pain. Assess stenosis. History of discitis osteomyelitis. EXAM: CT LUMBAR SPINE WITHOUT CONTRAST TECHNIQUE: Multidetector CT imaging of the lumbar spine was performed without intravenous contrast administration. Multiplanar CT image reconstructions were also generated. COMPARISON:  MRI of the lumbar spine July 09, 2018 and CT abdomen and pelvis June 16, 2018. FINDINGS: SEGMENTATION: For the purposes of this report the last well-formed intervertebral disc space is reported as L5-S1. ALIGNMENT: Maintained lumbar lordosis. Minimal grade 1 L4-5 anterolisthesis. VERTEBRAE: Severe L1-2 disc height loss with subsidence, destructive bony changes L1 inferior and L2 superior endplates with mild L2 height loss, progressed from prior examination. Remaining lumbar vertebral bodies intact. Remaining disc heights preserved. PARASPINAL AND OTHER SOFT TISSUES: Abnormal soft tissue contiguous with RIGHT crus of the diaphragm with punctate calcifications. Abnormal prevertebral soft tissue swelling with punctate calcifications. Through L2-3 with bilateral iliopsoas fat stranding. Mild calcific atherosclerosis aortoiliac vessels. DISC LEVELS: Soft tissue and punctate bony fragments at L1-2 result in severe canal stenosis with narrowed neural foramen which may affect the  traversing L2 nerves. Neural foraminal stenosis better demonstrated on today's MRI. Progressive widening LEFT L4-5 facet at 3 mm. IMPRESSION: 1. 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 Severe L1-2 canal stenosis. 2. Prevertebral phlegmon/abscess better demonstrated on today's MRI; given presence of punctate calcifications, recommend exclusion of tuberculosis. 3. Increased widening of LEFT L4-5 facet may be infectious or inflammatory. Grade 1 L4-5 anterolisthesis without spondylolysis. 4. These results will be called to the ordering clinician or representative by the professional radiologist assistant, and communication documented in [facility] Dashboard. Electronically Signed   By: Elon Alas M.D.   On: 07/09/2018 19:23   Mr Lumbar Spine W Wo Contrast  Result Date: 07/09/2018 CLINICAL DATA:  Back pain.  Follow-up discitis osteomyelitis. EXAM: MRI LUMBAR SPINE WITHOUT AND WITH CONTRAST TECHNIQUE: Multiplanar and multiecho pulse sequences of the lumbar spine were obtained without and with intravenous contrast. CONTRAST:  7.7 cc Gadavist COMPARISON:  MRI of the lumbar spine June 16, 2018. FINDINGS: SEGMENTATION: For the purposes of this report, the last well-formed intervertebral disc is reported as L5-S1. ALIGNMENT: Maintained lumbar lordosis. Grade 1 L4-5 anterolisthesis without spondylolysis. VERTEBRAE: Slight interval expansion of L1-2 disc height loss with worsening rim enhancing fluid extending into the prevertebral and epidural space. Diffusely low T1 and bright T2 signal within the L1 and L2 vertebral bodies. Remaining lumbar vertebral bodies and disc spaces intact. Scattered focal fat versus hemangiomas. CONUS MEDULLARIS AND CAUDA EQUINA: Conus medullaris deformity and faint suspected cord edema at L1-2. No nerve root clumping or leptomeningeal enhancement. PARASPINAL AND OTHER SOFT TISSUES:Moderately motion degraded axial sequences. Epidural abscess from T12-L2  0.6 x 0.7 x 6.3 cm (transverse by AP by cc) ventral epidural abscess. Dorsal epidural enhancement consistent with infection without focal fluid. Rim enhancing abscess contiguous with RIGHT crus of the diaphragm measuring to  2.1 cm with prevertebral phlegmon from T11-12 through L2-3. Bilateral iliopsoas muscle myositis without focal fluid collection though axial sequences motion degraded. Subcentimeter layering gallstones. DISC LEVELS: T11-12: Ventral epidural abscess measuring to 3 mm without canal stenosis or neural foraminal narrowing. T12-L1: Ventral epidural abscess measuring to 4 mm. Mild facet arthropathy and ligamentum flavum redundancy. Mild canal stenosis is slightly narrowed LEFT lateral recess which may affect the traversing LEFT L1 nerve. No neural foraminal narrowing. L1-2: Ventral epidural abscess. Enhancement within RIGHT facet. Moderate canal stenosis with narrowed lateral recesses which may affect the traversing L2 nerve. Soft tissue resulting in moderate to severe neural foraminal narrowing. L2-3: No disc bulge, canal stenosis nor neural foraminal narrowing. Mild facet arthropathy. L3-4: Annular bulging asymmetric to the RIGHT, mild facet arthropathy. No canal stenosis. Mild RIGHT neural foraminal narrowing. L4-5: Anterolisthesis. Annular bulging, moderate to severe facet arthropathy and ligamentum flavum redundancy with bilateral facet effusions, enhancing on the LEFT. No canal stenosis. Moderate RIGHT neural foraminal narrowing. L5-S1: No disc bulge, canal stenosis nor neural foraminal narrowing. Mild facet arthropathy. IMPRESSION: 1. 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 Prevertebral abscess contiguous with RIGHT crus of the diaphragm measuring to 2.1 cm. Iliopsoas myositis. 2. Worsening moderate canal stenosis L1-2 due to epidural collection. Moderate to severe L1-2 neural foraminal effacement. 3. Worsening LEFT L4-5 facet enhancement may be reactive or infectious.  Electronically Signed   By: Elon Alas M.D.   On: 07/09/2018 15:05   Recent Labs    07/08/18 0515  WBC 4.6  HGB 10.0*  HCT 32.5*  PLT 269   Recent Labs    07/08/18 0515  NA 135  K 3.5  CL 97*  CO2 29  GLUCOSE 108*  BUN 15  CREATININE 0.60  CALCIUM 8.3*    Intake/Output Summary (Last 24 hours) at 07/10/2018 0757 Last data filed at 07/10/2018 0656 Gross per 24 hour  Intake 1280 ml  Output 3050 ml  Net -1770 ml     Physical Exam: Vital Signs Blood pressure (!) 143/69, pulse 93, temperature 98 F (36.7 C), temperature source Oral, resp. rate 18, height 4\' 10"  (1.473 m), weight 77.4 kg, SpO2 97 %.  Constitutional: No distress . Vital signs reviewed. HEENT: EOMI, oral membranes moist Neck: supple Cardiovascular: RRR without murmur. No JVD    Respiratory: CTA Bilaterally without wheezes or rales. Normal effort    GI: BS +, non-tender, non-distended  Musc: Low back remains TTP, both legs tender as well.  Neurological: Alert and oriented x 3 HOH Motor: Bilateral UE 4/5 proximal to distal.  Bilateral LE: remain limited by pain 2- to 2/5 proximally, 3+/5 ADF.--Continue pain inhibition.  Both knees are tight but some of this is contracture related.  She refuses to move these much on her own.  Reflexes are trace to absent. Skin:lesions bilateral lower extremities healed.  Sacrum dressed Right foot ulceration granulated in nicely/closed.  Psych: irritable and anxious   Assessment/Plan: 1. Functional deficits secondary to Lumbar discitis which require 3+ hours per day of interdisciplinary therapy in a comprehensive inpatient rehab setting.  Physiatrist is providing close team supervision and 24 hour management of active medical problems listed below.  Physiatrist and rehab team continue to assess barriers to discharge/monitor patient progress toward functional and medical goals  Care Tool:  Bathing    Body parts bathed by patient: Right arm, Chest, Left arm,  Abdomen, Face, Right upper leg, Left upper leg, Front perineal area(declines feet)   Body parts bathed by  helper: Buttocks Body parts n/a: Front perineal area, Buttocks   Bathing assist Assist Level: Minimal Assistance - Patient > 75%     Upper Body Dressing/Undressing Upper body dressing   What is the patient wearing?: Dress    Upper body assist Assist Level: Minimal Assistance - Patient > 75%(zipper)    Lower Body Dressing/Undressing Lower body dressing      What is the patient wearing?: Incontinence brief, Pants     Lower body assist Assist for lower body dressing: Total Assistance - Patient < 25%     Toileting Toileting Toileting Activity did not occur Landscape architect and hygiene only): Refused  Toileting assist Assist for toileting: Maximal Assistance - Patient 25 - 49%     Transfers Chair/bed transfer  Transfers assist  Chair/bed transfer activity did not occur: Safety/medical concerns  Chair/bed transfer assist level: Maximal Assistance - Patient 25 - 49%     Locomotion Ambulation   Ambulation assist      Assist level: Moderate Assistance - Patient 50 - 74% Assistive device: Walker-rolling Max distance: 15'   Walk 10 feet activity   Assist  Walk 10 feet activity did not occur: Safety/medical concerns  Assist level: Moderate Assistance - Patient - 50 - 74% Assistive device: Walker-rolling   Walk 50 feet activity   Assist Walk 50 feet with 2 turns activity did not occur: Safety/medical concerns  Assist level: Minimal Assistance - Patient > 75%      Walk 150 feet activity   Assist Walk 150 feet activity did not occur: Safety/medical concerns         Walk 10 feet on uneven surface  activity   Assist Walk 10 feet on uneven surfaces activity did not occur: Safety/medical concerns         Wheelchair     Assist Will patient use wheelchair at discharge?: No      Wheelchair assist level: Supervision/Verbal cueing Max  wheelchair distance: 150    Wheelchair 50 feet with 2 turns activity    Assist        Assist Level: Supervision/Verbal cueing   Wheelchair 150 feet activity     Assist     Assist Level: Supervision/Verbal cueing    Medical Problem List and Plan: 1.Decreased functional mobilitysecondary to L1-2 discitis/ osteomyelitis as well as right foot ulcerationcomplicated by cardiac arrest  -Continue therapies as tolerated.  Per family reports patient's not too far from her baseline  -pain a limiting factor.  Tried to provide positive reinforcement to patient today.  -I have reviewed MRI/CT in detail. CT does demonstrate "severe" central stenosis.  Pt is certainly no different neurologically than before. Pain is persistent. Has been inconsistently better with changes in analgesics.   -await further recs/discussion from NS and ID as to plan moving forward  -explained situation in detail to patient who expressed an understanding  2. DVT Prophylaxis/Anticoagulation: Subcutaneous heparin.     Vascular studies limited, but negative for DVT 3. Pain Management:Hydrocodone as needed  -reduced Robaxin to 500mg  q6---increase back to 750mg  d/t spasms   -heat, ice   to left knee, back  -increased ms contin to 30mg  q12 2/17 4. Mood:Provide emotional support 5. Neuropsych: This patientiscapable of making decisions on herown behalf. 6. Skin/Wound Care/right foot wound ulceration:  -wound healing nicely, continue current care 7. Fluids/Electrolytes/Nutrition:encourage po  I personally reviewed the patient's labs today.   8. MSSA bacteremia.IV Ancef until 08/13/2018. Follow-up per infectious disease 9. Acute on chronic anemia.   -diet/fe+  supp  -Hgb 10.0 2/18  -stool ob negative 2/13    10. Diabetes mellitus with peripheral neuropathy. Hemoglobin A1c 8.7. Lantus insulin 25 units daily.    Hypoglycemic more often than not recently  -reduce lantus to 20u daily 11. Hypertension.  Lisinopril 10 mg daily,Lasix 40 mg daily,Lopressor 25 mg twice a day.   Borderline to reasonable control on 2/20 12. B12 deficiency.Continue vitamin B12 13.  Hyponatremia  Sodium 135 on 2/18    14.  Urine retention: Continue intermittent catheterizations as needed.  ua neg, ucx neg    Urecholine trial with improved emptying 15. Constipation: bowel regimen augmented   LOS: 14 days A FACE TO FACE EVALUATION WAS PERFORMED  Meredith Staggers 07/10/2018, 7:57 AM

## 2018-07-10 NOTE — Progress Notes (Signed)
Patient ID: Kaitlin Vargas, female   DOB: [birth year], 68 y.o.   MRN: [id number] Patient seen and examined.  She seems to be in less pain today.  She is sitting comfortably and eating dinner.  She seems to be moving her legs in bed okay.  Reviewed her imaging including her MRI and her CT scan once again.  She has progressive destruction of the L1-2 disc space with developing kyphosis at that level.  She has epidural abscess.  He has pain which is limited her mobility for about 3 weeks.  I have recommended L1-2 decompressive laminectomy followed by instrumented fusion T10-L3.  I'M not sure I can do much to decompress the epidural abscess or treat the discitis, I will try to reach up under the dura with ball probe to see if I can release some of the phlegmon or abscess.  However, my intent with the surgery is to hopefully stabilize the L1 -2 segment to allow for some pain control and mobilization.  I do not believe I am doing the surgery with the intent to treat the infection in some way, as a believe that antibiotics have to do this.  If I can stabilize the segment it may improve her pain syndrome and allow for more mobilization.  Spoken to her at length.  She is in agreement with moving forward.  She understands the risk of the surgery include but not limited to bleeding, infection, CSF leak, nerve injury, spinal cord injury, numbness, weakness, paralysis, loss of bowel bladder or sexual function, pseudoarthrosis, hardware failure, placed hardware, need for further surgery, adjacent level stenosis, lack of relief of symptoms, worsening symptoms, and anesthesia risk including DVT pneumonia MI and death.  She agrees to proceed

## 2018-07-10 NOTE — Progress Notes (Signed)
Social Work  Discharge Note  The overall goal for the admission was met for:   Discharge location: No - transfer back to acute due to medical issues.  (CIR d/c plan had been for pt to d/c home with spouse who can provide 24/7 assistance)  Length of Stay: No - pt transferred back to acute.  (CIR had targeted a d/c date of 07/12/18)  Discharge activity level: No - (CIR had min assist goals - not yet met)  Home/community participation: No  Services provided included: MD, RD, PT, OT, RN, Pharmacy and [place] [facility]  Follow-up services arranged: NA  Comments (or additional information):  Patient/Family verbalized understanding of follow-up arrangements: NA  Individual responsible for coordination of the follow-up plan: pt  Confirmed correct DME delivered: NA    Kaitlin Vargas

## 2018-07-10 NOTE — Discharge Summary (Signed)
Physician Discharge Summary  Patient ID: Kaitlin Vargas MRN: [id number] DOB/AGE: 1950-06-10 68 y.o.  Admit date: 06/26/2018 Discharge date: 07/10/2018  Admission Diagnoses:lumbar discitis with osteomyelitis  Discharge Diagnoses:  Principal Problem:   Lumbar discitis Active Problems:   Essential hypertension   Type 2 diabetes mellitus with hyperlipidemia (HCC)   Diabetic foot ulcer (HCC)   Sinus tachycardia   Hyponatremia   Labile blood glucose   Acute on chronic anemia   Bacteremia   Epidural abscess   Discharged Condition: guarded  Hospital Course: patient is a 68 year old right handed female history of hypertension hyperlipidemia. Reported back pain 4 weeks. Lives with spouse. Independent with assistive device prior to admission. She recently came to the hospital for cough diagnosed with adenovirus in mid-January received antibiotic coverage discharged to home. Continued back pain lower extremities bouts of urinary incontinence and stool incontinence. Presented to [facility] hospital 06/16/2018 with increasing back pain CT scan of the spine show what appeared to be osteomyelitis she was transferred to [facility] interventional radiology for lumbar puncture and during procedure noted increase active bleeding as well as cardiac arrest requiring CPR 90 seconds. She did not require intubation. It was felt cardiac arrest likely due to respiratory arrest following sedation. 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 Also noted right foot ulceration. MRI the foot negative for acute bony or joint abnormality. Small volume of fluid in the subcutaneous tissue and the first TMT joint. Infectious disease consulted with GPC's growing from lumbar aspirate. Placed on anabolic therapy. TEE negative for vegetation. Maintained on cefazolin in 6-8 weeks through 08/13/2018.Subcutaneous heparin for DVT prophylaxis.  Hospital course pain management. Acute on chronic anemia 9.9. Bouts of hyponatremia. Patient was admitted to inpatient rehabilitation services to 11/08/2018 with slow but progressive gains. Moderate assist for bed mobility sit to stand moderate assist tolerating standing shifting her weight 2 minutes wheelchair mobility using bilateral upper extremities. Noted on 07/09/2018 patient with increasing back pain . MRI lumbar spine with and without contrast showed worsening L1-2 discitis osteomyelitis with large epidural abscess. Prevertebral abscess contiguous with right crus of the diaphragm measuring 2.1 cm. Worsening moderate canal stenosis. With these noted findings infectious disease as well as neurosurgery consulted in regards to antibiotic therapy possible need for surgical intervention. After collaboration with both neurosurgery and infectious disease patient was discharged to acute care services awaiting plan for possible surgical intervention.  Consults: Dr. Sherley Bounds neurosurgery, Dr. Drucilla Schmidt infectious disease  Significant Diagnostic Studies: see history of present illness  Treatments: antibiotics:IV Ancef  Discharge Exam: Blood pressure (!) 143/69, pulse 93, temperature 98 F (36.7 C), temperature source Oral, resp. rate 18, height 4\' 10"  (1.473 m), weight 77.4 kg, SpO2 97 %. EOMs intact. Neck supple nontender no JVD cardiac rate controlled. Lungs clear to auscultation without wheeze abdomen soft nontender good bowel sounds. Neurologically patient was alert mood was a bit flat followed full commands.  Disposition: Discharge disposition: [place] Not Defined         Follow-up Information    Meredith Staggers, MD Follow up.   Specialty:  Physical Medicine and Rehabilitation Why:  office to call for appointment Contact information: [address] [place] [address] [phone]        Thayer Headings, MD Follow up.   Specialty:   Infectious Diseases Why:  call for appointment Contact information: [address]  [place]           Signed: Lavon Paganini [place] 07/10/2018, 10:48 AM

## 2018-07-10 NOTE — Progress Notes (Signed)
Report called to 4NP, pt cathed prior to being transferred. Pt A+O able to make needs known.

## 2018-07-10 NOTE — H&P (Signed)
History and Physical    Kaitlin Vargas EHU:[id number] DOB: [birth year] DOA: 06/26/2018  PCP: [name], MD  Patient coming from: inpatient rehab   I have personally briefly reviewed patient's old medical records available.   Chief Complaint: back pain   HPI: Kaitlin Vargas is a 68 y.o. female with medical history significant of hypertension, hyperlipidemia, B12 deficiency, type 2 diabetes currently on insulin.  Patient had suffered from back pain for about 4 weeks when she was admitted earlier.  Patient presented to [facility] on 06/16/2018 with increasing back pain after recent treatment for bronchitis and COPD.  A CT scan of the spine showed osteomyelitis and she was transferred to [facility].  She had cardiac arrest during procedure requiring CPR for about 1 to 2 minutes.  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  Blood cultures showed MSSA.  She does have generalized weakness on both lower extremities and was followed by ID and neurosurgery.  Echo was negative for endocarditis.  On 06/26/2018 she was transferred to inpatient rehab to continue inpatient rehab with IV antibiotics and PICC line on right arm.  Patient does have pain issues and she is on long-acting morphine as well as oxycodone.  Since last 1 day, patient is started complaining of increasing back pain.  MRI of the lumbar spine with and without contrast showed worsening L1-2 discitis osteomyelitis with last epidural abscess.  Also showed prevertebral abscess.  ID and neurosurgery consulted.  After much discussion, neurosurgery decided to explore the abscess as it is not responding to antibiotics and requested transfer to medical floor. On my evaluation, patient complains that she is not able to walk on legs and that is for more than a month. Patient was having severe lower back pain, 10 out of 10, worse on mobility without radiation and no associated incontinence, nausea  vomiting. Also noted blood sugars less than 40 in the evening on 07/08/2018 and 07/09/2018, Levemir dose was reduced from 25 to 20 units in the morning.  Review of Systems: As per HPI otherwise 10 point review of systems negative.  No fever or chills.  No flulike symptoms.  Past Medical History:  Diagnosis Date  . Anemia   . Diabetes mellitus without complication ([place])   . Hyperlipidemia   . Hypertension   . Peripheral neuropathy     Past Surgical History:  Procedure Laterality Date  . ABDOMINAL HYSTERECTOMY    . IR LUMBAR DISC ASPIRATION W/IMG GUIDE  06/18/2018     reports that she has never smoked. She has never used smokeless tobacco. She reports that she does not drink alcohol or use drugs.  Allergies  Allergen Reactions  . Sulfa Antibiotics Rash    Family History  Problem Relation Age of Onset  . Cancer Mother   . Stroke Mother   . Cancer Father   . Leukemia Father      Prior to Admission medications   Medication Sig Start Date End Date Taking? Authorizing Provider  albuterol (PROVENTIL HFA;VENTOLIN HFA) 108 (90 Base) MCG/ACT inhaler Inhale 2 puffs into the lungs every 6 (six) hours as needed for wheezing or shortness of breath. 06/09/18   Manuella Ghazi, Pratik D, DO  albuterol (PROVENTIL) (2.5 MG/3ML) 0.083% nebulizer solution Take 3 mLs (2.5 mg total) by nebulization every 2 (two) hours as needed for wheezing or shortness of breath. 07/10/18   Angiulli, Lavon Paganini, PA-C  aspirin 81 MG chewable tablet Chew 81 mg by mouth  every morning.     [provider]  bethanechol (URECHOLINE) 10 MG tablet Take 1 tablet (10 mg total) by mouth 3 (three) times daily. 07/10/18   Angiulli, Lavon Paganini, PA-C  bisacodyl (DULCOLAX) 10 MG suppository Place 1 suppository (10 mg total) rectally daily as needed for moderate constipation. 07/10/18   Angiulli, Lavon Paganini, PA-C  ceFAZolin (ANCEF) IVPB Inject 2 g into the vein every 8 (eight) hours. Indication: MSSA discitis/osteomyelitis Last Day of  Therapy: 08/13/2018 Labs - Once weekly:  CBC/D and BMP, Labs - Every other week:  ESR and CRP 06/26/18 08/19/18  Shelly Coss, MD  furosemide (LASIX) 20 MG tablet Take 2 tablets (40 mg total) by mouth daily. 06/27/18   Shelly Coss, MD  glipiZIDE (GLUCOTROL XL) 10 MG 24 hr tablet Take 10 mg by mouth 2 (two) times daily.  04/28/18   [provider]  heparin 5000 UNIT/ML injection Inject 1 mL (5,000 Units total) into the skin every 8 (eight) hours. 07/10/18   Angiulli, Lavon Paganini, PA-C  HYDROcodone-acetaminophen (NORCO) 10-325 MG tablet Take 1 tablet by mouth every 4 (four) hours as needed for moderate pain. 06/26/18   Shelly Coss, MD  insulin aspart (NOVOLOG) 100 UNIT/ML injection Inject 0-9 Units into the skin 3 (three) times daily with meals. 07/10/18   Angiulli, Lavon Paganini, PA-C  insulin glargine (LANTUS) 100 UNIT/ML injection Inject 0.2 mLs (20 Units total) into the skin daily. 07/11/18   Angiulli, Lavon Paganini, PA-C  ketoconazole (NIZORAL) 2 % cream Apply topically 2 (two) times daily. 07/10/18   Angiulli, Lavon Paganini, PA-C  lisinopril (PRINIVIL,ZESTRIL) 20 MG tablet Take 10 mg by mouth daily.  02/06/18   [provider]  metFORMIN (GLUCOPHAGE) 1000 MG tablet Take 1,000 mg by mouth 2 (two) times daily with a meal.  07/02/17 07/02/18  [provider]  methocarbamol (ROBAXIN) 500 MG tablet Take 1 tablet (500 mg total) by mouth every 6 (six) hours as needed for muscle spasms. 06/26/18   Shelly Coss, MD  methocarbamol (ROBAXIN) 750 MG tablet Take 1 tablet (750 mg total) by mouth every 6 (six) hours. 07/10/18   Angiulli, Lavon Paganini, PA-C  metoprolol tartrate (LOPRESSOR) 25 MG tablet Take 1 tablet (25 mg total) by mouth 2 (two) times daily. 06/26/18   Shelly Coss, MD  morphine (MS CONTIN) 30 MG 12 hr tablet Take 1 tablet (30 mg total) by mouth every 12 (twelve) hours. 07/10/18   Angiulli, Lavon Paganini, PA-C  Multiple Vitamin (THERA) TABS Take 1 tablet by mouth daily.     [provider]   pantoprazole (PROTONIX) 40 MG tablet Take 1 tablet (40 mg total) by mouth daily. 06/09/18 06/09/19  Manuella Ghazi, Pratik D, DO  pioglitazone (ACTOS) 30 MG tablet Take 30 mg by mouth daily.  04/28/18   [provider]  senna-docusate (SENOKOT-S) 8.6-50 MG tablet Take 2 tablets by mouth at bedtime. 07/10/18   Angiulli, Lavon Paganini, PA-C  vitamin B-12 (CYANOCOBALAMIN) 1000 MCG tablet Take 1 tablet (1,000 mcg total) by mouth daily for 30 days. 07/10/18 08/09/18  Cathlyn Parsons, PA-C    Physical Exam: Vitals:   07/09/18 1615 07/09/18 1937 07/09/18 2144 07/10/18 0614  BP: (!) 145/73 132/67 (!) 164/83 (!) 143/69  Pulse: 99 99 (!) 101 93  Resp: _0 Temp: 99.2 F (37.3 C) 98.4 F (36.9 C)  98 F (36.7 C)  TempSrc: Oral   Oral  SpO2: 98% 98% 100% 97%  Weight:  Height:        Constitutional: NAD, calm, comfortable Vitals:   07/09/18 1615 07/09/18 1937 07/09/18 2144 07/10/18 0614  BP: (!) 145/73 132/67 (!) 164/83 (!) 143/69  Pulse: 99 99 (!) 101 93  Resp: _0 Temp: 99.2 F (37.3 C) 98.4 F (36.9 C)  98 F (36.7 C)  TempSrc: Oral   Oral  SpO2: 98% 98% 100% 97%  Weight:      Height:       Eyes: PERRL, lids and conjunctivae normal Not in any distress.  On room air.  Chronically sick looking. ENMT: Mucous membranes are moist. Posterior pharynx clear of any exudate or lesions.Normal dentition.  Neck: normal, supple, no masses, no thyromegaly Respiratory: clear to auscultation bilaterally, no wheezing, no crackles. Normal respiratory effort. No accessory muscle use.  Cardiovascular: Regular rate and rhythm, no murmurs / rubs / gallops. No extremity edema. 2+ pedal pulses. No carotid bruits.  Abdomen: no tenderness, no masses palpated. No hepatosplenomegaly. Bowel sounds positive.  Musculoskeletal: no clubbing / cyanosis. No joint deformity upper and lower extremities. Good ROM, no contractures. Normal muscle tone.  Skin: no rashes, lesions, ulcers. No  induration Neurologic: CN 2-12 grossly intact. Sensation intact, DTR normal. Strength 5/5 in both upper extremities. Strength 2-3/5 on both lower extremities.  Patient complains of pain on mobility. Psychiatric: Normal judgment and insight. Alert and oriented x 3. Normal mood.     Labs on Admission: I have personally reviewed following labs and imaging studies  CBC: Recent Labs  Lab 07/04/18 0432 07/05/18 0334 07/08/18 0515  WBC 5.3 6.8 4.6  HGB 7.2* 10.6* 10.0*  HCT 24.4* 33.4* 32.5*  MCV 106.6* 99.1 102.2*  PLT 283 277 938   Basic Metabolic Panel: Recent Labs  Lab 07/08/18 0515  NA 135  K 3.5  CL 97*  CO2 29  GLUCOSE 108*  BUN 15  CREATININE 0.60  CALCIUM 8.3*   GFR: Estimated Creatinine Clearance: 58.1 mL/min (by C-G formula based on SCr of 0.6 mg/dL). Liver Function Tests: Recent Labs  Lab 07/08/18 0515  AST 14*  ALT <5  ALKPHOS 121  BILITOT 0.5  PROT 5.7*  ALBUMIN 1.9*   No results for input(s): LIPASE, AMYLASE in the last 168 hours. No results for input(s): AMMONIA in the last 168 hours. Coagulation Profile: No results for input(s): INR, PROTIME in the last 168 hours. Cardiac Enzymes: No results for input(s): CKTOTAL, CKMB, CKMBINDEX, TROPONINI in the last 168 hours. BNP (last 3 results) No results for input(s): PROBNP in the last 8760 hours. HbA1C: No results for input(s): HGBA1C in the last 72 hours. CBG: Recent Labs  Lab 07/09/18 1709 07/09/18 2116 07/09/18 2154 07/10/18 0622 07/10/18 1154  GLUCAP 88 47* 83 84 87   Lipid Profile: No results for input(s): CHOL, HDL, LDLCALC, TRIG, CHOLHDL, LDLDIRECT in the last 72 hours. Thyroid Function Tests: No results for input(s): TSH, T4TOTAL, FREET4, T3FREE, THYROIDAB in the last 72 hours. Anemia Panel: No results for input(s): VITAMINB12, FOLATE, FERRITIN, TIBC, IRON, RETICCTPCT in the last 72 hours. Urine analysis:    Component Value Date/Time   COLORURINE YELLOW 07/06/2018 1332    APPEARANCEUR CLEAR 07/06/2018 1332   LABSPEC 1.011 07/06/2018 1332   PHURINE 7.0 07/06/2018 1332   GLUCOSEU 50 (A) 07/06/2018 1332   HGBUR NEGATIVE 07/06/2018 1332   BILIRUBINUR NEGATIVE 07/06/2018 1332   KETONESUR NEGATIVE 07/06/2018 1332   PROTEINUR NEGATIVE 07/06/2018 1332   NITRITE NEGATIVE 07/06/2018 1332   LEUKOCYTESUR  NEGATIVE 07/06/2018 1332    Radiological Exams on Admission: Ct Lumbar Spine Wo Contrast  Result Date: 07/09/2018 CLINICAL DATA:  Low back pain. Assess stenosis. History of discitis osteomyelitis. EXAM: CT LUMBAR SPINE WITHOUT CONTRAST TECHNIQUE: Multidetector CT imaging of the lumbar spine was performed without intravenous contrast administration. Multiplanar CT image reconstructions were also generated. COMPARISON:  MRI of the lumbar spine July 09, 2018 and CT abdomen and pelvis June 16, 2018. FINDINGS: SEGMENTATION: For the purposes of this report the last well-formed intervertebral disc space is reported as L5-S1. ALIGNMENT: Maintained lumbar lordosis. Minimal grade 1 L4-5 anterolisthesis. VERTEBRAE: Severe L1-2 disc height loss with subsidence, destructive bony changes L1 inferior and L2 superior endplates with mild L2 height loss, progressed from prior examination. Remaining lumbar vertebral bodies intact. Remaining disc heights preserved. PARASPINAL AND OTHER SOFT TISSUES: Abnormal soft tissue contiguous with RIGHT crus of the diaphragm with punctate calcifications. Abnormal prevertebral soft tissue swelling with punctate calcifications. Through L2-3 with bilateral iliopsoas fat stranding. Mild calcific atherosclerosis aortoiliac vessels. DISC LEVELS: Soft tissue and punctate bony fragments at L1-2 result in severe canal stenosis with narrowed neural foramen which may affect the traversing L2 nerves. Neural foraminal stenosis better demonstrated on today's MRI. Progressive widening LEFT L4-5 facet at 3 mm. IMPRESSION: 1. Worsening L1-2 discitis osteomyelitis with  progressive endplate destruction. Severe L1-2 canal stenosis. 2. Prevertebral phlegmon/abscess better demonstrated on today's MRI; given presence of punctate calcifications, recommend exclusion of tuberculosis. 3. Increased widening of LEFT L4-5 facet may be infectious or inflammatory. Grade 1 L4-5 anterolisthesis without spondylolysis. 4. These results will be called to the ordering clinician or representative by the professional radiologist assistant, and communication documented in [employer] Dashboard. Electronically Signed   By: Elon Alas M.D.   On: 07/09/2018 19:23   Mr Lumbar Spine W Wo Contrast  Result Date: 07/09/2018 CLINICAL DATA:  Back pain.  Follow-up discitis osteomyelitis. EXAM: MRI LUMBAR SPINE WITHOUT AND WITH CONTRAST TECHNIQUE: Multiplanar and multiecho pulse sequences of the lumbar spine were obtained without and with intravenous contrast. CONTRAST:  7.7 cc Gadavist COMPARISON:  MRI of the lumbar spine June 16, 2018. FINDINGS: SEGMENTATION: For the purposes of this report, the last well-formed intervertebral disc is reported as L5-S1. ALIGNMENT: Maintained lumbar lordosis. Grade 1 L4-5 anterolisthesis without spondylolysis. VERTEBRAE: Slight interval expansion of L1-2 disc height loss with worsening rim enhancing fluid extending into the prevertebral and epidural space. Diffusely low T1 and bright T2 signal within the L1 and L2 vertebral bodies. Remaining lumbar vertebral bodies and disc spaces intact. Scattered focal fat versus hemangiomas. CONUS MEDULLARIS AND CAUDA EQUINA: Conus medullaris deformity and faint suspected cord edema at L1-2. No nerve root clumping or leptomeningeal enhancement. PARASPINAL AND OTHER SOFT TISSUES:Moderately motion degraded axial sequences. Epidural abscess from T12-L2 0.6 x 0.7 x 6.3 cm (transverse by AP by cc) ventral epidural abscess. Dorsal epidural enhancement consistent with infection without focal fluid. Rim enhancing abscess contiguous with RIGHT  crus of the diaphragm measuring to 2.1 cm with prevertebral phlegmon from T11-12 through L2-3. Bilateral iliopsoas muscle myositis without focal fluid collection though axial sequences motion degraded. Subcentimeter layering gallstones. DISC LEVELS: T11-12: Ventral epidural abscess measuring to 3 mm without canal stenosis or neural foraminal narrowing. T12-L1: Ventral epidural abscess measuring to 4 mm. Mild facet arthropathy and ligamentum flavum redundancy. Mild canal stenosis is slightly narrowed LEFT lateral recess which may affect the traversing LEFT L1 nerve. No neural foraminal narrowing. L1-2: Ventral epidural abscess. Enhancement within RIGHT facet. Moderate canal  stenosis with narrowed lateral recesses which may affect the traversing L2 nerve. Soft tissue resulting in moderate to severe neural foraminal narrowing. L2-3: No disc bulge, canal stenosis nor neural foraminal narrowing. Mild facet arthropathy. L3-4: Annular bulging asymmetric to the RIGHT, mild facet arthropathy. No canal stenosis. Mild RIGHT neural foraminal narrowing. L4-5: Anterolisthesis. Annular bulging, moderate to severe facet arthropathy and ligamentum flavum redundancy with bilateral facet effusions, enhancing on the LEFT. No canal stenosis. Moderate RIGHT neural foraminal narrowing. L5-S1: No disc bulge, canal stenosis nor neural foraminal narrowing. Mild facet arthropathy. IMPRESSION: 1. 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 Prevertebral abscess contiguous with RIGHT crus of the diaphragm measuring to 2.1 cm. Iliopsoas myositis. 2. Worsening moderate canal stenosis L1-2 due to epidural collection. Moderate to severe L1-2 neural foraminal effacement. 3. Worsening LEFT L4-5 facet enhancement may be reactive or infectious. Electronically Signed   By: Elon Alas M.D.   On: 07/09/2018 15:05    EKG: Independently reviewed.  EKG from 06/17/2018 reviewed and is essentially normal with low  voltage.  Assessment/Plan Principal Problem:   Lumbar discitis Active Problems:   Essential hypertension   Type 2 diabetes mellitus with hyperlipidemia ([facility])   Diabetic foot ulcer ([facility])   Sinus tachycardia   Hyponatremia   Labile blood glucose   Acute on chronic anemia   Bacteremia   Epidural abscess     1.  Lumbar discitis, vertebral osteomyelitis with worsening abscess formation causing worsening pain and central canal stenosis: Agree with transfer to medical unit. Continue Ancef as planned by infectious disease. Further surgical management as per surgery, they are planning for posterior instrumentation and decompression, will keep n.p.o. past midnight. Continue neurochecks and monitoring.  2.  Type 2 diabetes with hyperglycemic episodes: Currently stabilized.  Her long-acting insulin doses have been reduced.  We will continue close monitoring.  3.  Severe back pain: Recurrent acute on chronic back pain.  Continue adequate long-acting as well as short-acting narcotics.  She will need more rehab after the procedure.  4.  Diabetic foot ulcer: Healed.  5.  Anemia of chronic disease: Currently hemoglobin is stable.  Agree with transfer to medical unit for anticipated procedure tomorrow.  DVT prophylaxis: SCDs.  Full code. Code Status: Full code. Family Communication: No family at bedside. Disposition Plan: Inpatient rehab. Consults called: Neurosurgery, infectious disease Admission status: Inpatient.   Barb Merino MD [facility] Pager [phone]  If 7PM-7AM, please contact night-coverage [email or web address] Password [facility]  07/10/2018, 12:31 PM

## 2018-07-10 NOTE — Progress Notes (Signed)
Subjective:   Worsening back pain  Antibiotics:  Anti-infectives (From admission, onward)   Start     Dose/Rate Route Frequency Ordered Stop   07/09/18 2200  ceFAZolin (ANCEF) IVPB 2g/100 mL premix     2 g 200 mL/hr over 30 Minutes Intravenous Every 8 hours 07/09/18 1820     07/09/18 2000  rifampin (RIFADIN) capsule 300 mg  Status:  Discontinued     300 mg Oral Every 12 hours 07/09/18 1743 07/09/18 1819   07/09/18 1800  vancomycin (VANCOCIN) 1,500 mg in sodium chloride 0.9 % 500 mL IVPB  Status:  Discontinued     1,500 mg 250 mL/hr over 120 Minutes Intravenous  Once 07/09/18 1748 07/09/18 2016   07/09/18 1800  piperacillin-tazobactam (ZOSYN) IVPB 3.375 g  Status:  Discontinued     3.375 g 12.5 mL/hr over 240 Minutes Intravenous Every 8 hours 07/09/18 1748 07/09/18 1800   06/26/18 2200  ceFAZolin (ANCEF) IVPB 2g/100 mL premix  Status:  Discontinued     2 g 200 mL/hr over 30 Minutes Intravenous Every 8 hours 06/26/18 1552 07/09/18 1743      Medications: Scheduled Meds: . aspirin  81 mg Oral Daily  . bethanechol  10 mg Oral TID  . feeding supplement (GLUCERNA SHAKE)  237 mL Oral TID WC  . feeding supplement (PRO-STAT SUGAR FREE 64)  30 mL Oral BID  . furosemide  40 mg Oral Daily  . heparin  5,000 Units Subcutaneous Q8H  . insulin aspart  0-9 Units Subcutaneous TID WC  . [START ON 07/11/2018] insulin glargine  20 Units Subcutaneous Daily  . ketoconazole   Topical BID  . lisinopril  10 mg Oral Daily  . methocarbamol  750 mg Oral Q6H  . metoprolol tartrate  25 mg Oral BID  . morphine  30 mg Oral Q12H  . multivitamin with minerals  1 tablet Oral Daily  . pantoprazole  40 mg Oral Daily  . senna-docusate  2 tablet Oral QHS  . sodium chloride flush  10-40 mL Intracatheter Q12H  . vitamin B-12  1,000 mcg Oral Daily   Continuous Infusions: .  ceFAZolin (ANCEF) IV Stopped (07/10/18 0710)   PRN Meds:.albuterol, bisacodyl, HYDROcodone-acetaminophen, sodium chloride  flush, sodium phosphate, sorbitol    Objective: Weight change:   Intake/Output Summary (Last 24 hours) at 07/10/2018 1027 Last data filed at 07/10/2018 0903 Gross per 24 hour  Intake 1040 ml  Output 4250 ml  Net -3210 ml   Blood pressure (!) 143/69, pulse 93, temperature 98 F (36.7 C), temperature source Oral, resp. rate 18, height 4\' 10"  (1.473 m), weight 77.4 kg, SpO2 97 %. Temp:  [98 F (36.7 C)-99.2 F (37.3 C)] 98 F (36.7 C) (02/20 0614) Pulse Rate:  [93-101] 93 (02/20 0614) Resp:  [16-18] 18 (02/20 0614) BP: (132-164)/(67-83) 143/69 (02/20 0614) SpO2:  [97 %-100 %] 97 % (02/20 9833)  Physical Exam: General: Alert and awake, oriented x3, not in any acute distress. HEENT: anicteric sclera, EOMI CVS regular rate, normal no murmurs gallops or rubs heard Chest: , no wheezing, no respiratory distress clear to auscultation bilaterally Abdomen: soft non-distended,  Skin: no rashes Neuro: She has a stronger grip and can flex and extend better on the right versus the left but she also is right-handed.  She has no other neurological deficits.  Her ability to raise her leg against gravity is compromised by her lower lumbar pain from her discitis.    CBC:  BMET Recent Labs    07/08/18 0515  NA 135  K 3.5  CL 97*  CO2 29  GLUCOSE 108*  BUN 15  CREATININE 0.60  CALCIUM 8.3*     Liver Panel  Recent Labs    07/08/18 0515  PROT 5.7*  ALBUMIN 1.9*  AST 14*  ALT <5  ALKPHOS 121  BILITOT 0.5       Sedimentation Rate No results for input(s): ESRSEDRATE in the last 72 hours. C-Reactive Protein No results for input(s): CRP in the last 72 hours.  Micro Results: Recent Results (from the past 720 hour(s))  Urine culture     Status: Abnormal   Collection Time: 06/16/18 12:57 PM  Result Value Ref Range Status   Specimen Description   Final    URINE, CLEAN CATCH Performed at [facility], [address] [place], [place]  [address]    Special Requests    Final    NONE Performed at [facility], [address] [place], [place] [address]    Culture (A)  Final    >=100,000 COLONIES/mL STAPHYLOCOCCUS AUREUS >=100,000 COLONIES/mL GRANULICATELLA ADIACENS Standardized susceptibility testing for this organism is not available. Performed at [facility], [place] [address], [place], [place] [address]    Report Status 06/20/2018 FINAL  Final   Organism ID, Bacteria STAPHYLOCOCCUS AUREUS (A)  Final      Susceptibility   Staphylococcus aureus - MIC*    CIPROFLOXACIN <=0.5 SENSITIVE Sensitive     GENTAMICIN <=0.5 SENSITIVE Sensitive     NITROFURANTOIN <=16 SENSITIVE Sensitive     OXACILLIN <=0.25 SENSITIVE Sensitive     TETRACYCLINE <=1 SENSITIVE Sensitive     VANCOMYCIN <=0.5 SENSITIVE Sensitive     TRIMETH/SULFA <=10 SENSITIVE Sensitive     CLINDAMYCIN <=0.25 SENSITIVE Sensitive     RIFAMPIN <=0.5 SENSITIVE Sensitive     Inducible Clindamycin NEGATIVE Sensitive     * >=100,000 COLONIES/mL STAPHYLOCOCCUS AUREUS  Culture, blood (routine x 2)     Status: Abnormal   Collection Time: 06/16/18  1:53 PM  Result Value Ref Range Status   Specimen Description   Final    LEFT ANTECUBITAL Performed at [facility], [address] [place], [place] [address]    Special Requests   Final    BOTTLES DRAWN AEROBIC ONLY Blood Culture results may not be optimal due to an inadequate volume of blood received in culture bottles Performed at [facility], [address] [place], [place] [address]    Culture  Setup Time   Final    AEROBIC BOTTLE ONLY GRAM POSITIVE COCCI Gram Stain Report Called to,Read Back By and Verified With: J HEARN,RN @0338  06/17/18 [facility] Performed at [facility], [address], [place], [place] [address]    Culture (A)  Final    STAPHYLOCOCCUS AUREUS SUSCEPTIBILITIES PERFORMED ON PREVIOUS CULTURE WITHIN THE LAST 5 DAYS. Performed at [facility], [place] [address] [place], [place] [address]    Report Status 06/19/2018 FINAL   Final  Culture, blood (routine x 2)     Status: Abnormal   Collection Time: 06/16/18  2:51 PM  Result Value Ref Range Status   Specimen Description   Final    BLOOD BLOOD RIGHT WRIST Performed at [facility], [address], [place],  [address]    Special Requests   Final    BOTTLES DRAWN AEROBIC AND ANAEROBIC Blood Culture results may not be optimal due to an inadequate volume of blood received in culture  bottles Performed at [facility], [address], [place], [place] [address]    Culture  Setup Time   Final    IN BOTH AEROBIC AND ANAEROBIC BOTTLES GRAM POSITIVE COCCI Gram Stain Report Called to,Read Back By and Verified With: J HEARN,RN @0338  06/17/18 [facility] CRITICAL RESULT CALLED TO, READ BACK BY AND VERIFIED WITH: PHARMD L POOLE [id number] [phone] MLM Performed at [facility], [place] [address], [place], [place] [address]    Culture STAPHYLOCOCCUS AUREUS (A)  Final   Report Status 06/19/2018 FINAL  Final   Organism ID, Bacteria STAPHYLOCOCCUS AUREUS  Final      Susceptibility   Staphylococcus aureus - MIC*    CIPROFLOXACIN <=0.5 SENSITIVE Sensitive     ERYTHROMYCIN >=8 RESISTANT Resistant     GENTAMICIN <=0.5 SENSITIVE Sensitive     OXACILLIN <=0.25 SENSITIVE Sensitive     TETRACYCLINE <=1 SENSITIVE Sensitive     VANCOMYCIN <=0.5 SENSITIVE Sensitive     TRIMETH/SULFA <=10 SENSITIVE Sensitive     CLINDAMYCIN <=0.25 SENSITIVE Sensitive     RIFAMPIN <=0.5 SENSITIVE Sensitive     Inducible Clindamycin NEGATIVE Sensitive     * STAPHYLOCOCCUS AUREUS  Blood Culture ID Panel (Reflexed)     Status: Abnormal   Collection Time: 06/16/18  2:51 PM  Result Value Ref Range Status   Enterococcus species NOT DETECTED NOT DETECTED Final   Listeria monocytogenes NOT DETECTED NOT DETECTED Final   Staphylococcus species DETECTED (A) NOT DETECTED Final    Comment: CRITICAL RESULT CALLED TO, READ BACK BY AND VERIFIED WITH: PHARMD L POOLE [id number] 0800 MLM    Staphylococcus aureus (BCID)  DETECTED (A) NOT DETECTED Final    Comment: Methicillin (oxacillin) susceptible Staphylococcus aureus (MSSA). Preferred therapy is anti staphylococcal beta lactam antibiotic (Cefazolin or Nafcillin), unless clinically contraindicated. CRITICAL RESULT CALLED TO, READ BACK BY AND VERIFIED WITH: PHARMD L POOLE [id number] 0800 MLM    Methicillin resistance NOT DETECTED NOT DETECTED Final   Streptococcus species NOT DETECTED NOT DETECTED Final   Streptococcus agalactiae NOT DETECTED NOT DETECTED Final   Streptococcus pneumoniae NOT DETECTED NOT DETECTED Final   Streptococcus pyogenes NOT DETECTED NOT DETECTED Final   Acinetobacter baumannii NOT DETECTED NOT DETECTED Final   Enterobacteriaceae species NOT DETECTED NOT DETECTED Final   Enterobacter cloacae complex NOT DETECTED NOT DETECTED Final   Escherichia coli NOT DETECTED NOT DETECTED Final   Klebsiella oxytoca NOT DETECTED NOT DETECTED Final   Klebsiella pneumoniae NOT DETECTED NOT DETECTED Final   Proteus species NOT DETECTED NOT DETECTED Final   Serratia marcescens NOT DETECTED NOT DETECTED Final   Haemophilus influenzae NOT DETECTED NOT DETECTED Final   Neisseria meningitidis NOT DETECTED NOT DETECTED Final   Pseudomonas aeruginosa NOT DETECTED NOT DETECTED Final   Candida albicans NOT DETECTED NOT DETECTED Final   Candida glabrata NOT DETECTED NOT DETECTED Final   Candida krusei NOT DETECTED NOT DETECTED Final   Candida parapsilosis NOT DETECTED NOT DETECTED Final   Candida tropicalis NOT DETECTED NOT DETECTED Final    Comment: Performed at [facility], [place], [place], [place] [address]  MRSA PCR Screening     Status: None   Collection Time: 06/17/18 12:13 PM  Result Value Ref Range Status   MRSA by PCR NEGATIVE NEGATIVE Final    Comment:        The GeneXpert MRSA Assay (FDA approved for NASAL specimens only), is one component of a comprehensive MRSA colonization surveillance program. It is not  intended to diagnose  MRSA infection nor to guide or monitor treatment for MRSA infections. Performed at [facility], [place] [address] [place], [place] [address]   Aerobic/Anaerobic Culture (surgical/deep wound)     Status: None   Collection Time: 06/18/18 12:02 PM  Result Value Ref Range Status   Specimen Description VERTEBRA DISC  Final   Special Requests NONE  Final   Gram Stain   Final    ABUNDANT WBC PRESENT, PREDOMINANTLY PMN ABUNDANT GRAM POSITIVE COCCI IN PAIRS IN CLUSTERS    Culture   Final    MODERATE STAPHYLOCOCCUS AUREUS NO ANAEROBES ISOLATED Performed at [facility], [address], [place], [place] [address]    Report Status 06/23/2018 FINAL  Final   Organism ID, Bacteria STAPHYLOCOCCUS AUREUS  Final      Susceptibility   Staphylococcus aureus - MIC*    CIPROFLOXACIN <=0.5 SENSITIVE Sensitive     ERYTHROMYCIN >=8 RESISTANT Resistant     GENTAMICIN <=0.5 SENSITIVE Sensitive     OXACILLIN <=0.25 SENSITIVE Sensitive     TETRACYCLINE <=1 SENSITIVE Sensitive     VANCOMYCIN <=0.5 SENSITIVE Sensitive     TRIMETH/SULFA <=10 SENSITIVE Sensitive     CLINDAMYCIN <=0.25 SENSITIVE Sensitive     RIFAMPIN <=0.5 SENSITIVE Sensitive     Inducible Clindamycin NEGATIVE Sensitive     * MODERATE STAPHYLOCOCCUS AUREUS  Culture, blood (routine x 2)     Status: None   Collection Time: 06/18/18  4:15 PM  Result Value Ref Range Status   Specimen Description BLOOD LEFT ARM  Final   Special Requests   Final    BOTTLES DRAWN AEROBIC ONLY Blood Culture adequate volume   Culture   Final    NO GROWTH 5 DAYS Performed at [facility], [address], [place], [place] [address]    Report Status 06/23/2018 FINAL  Final  Culture, blood (routine x 2)     Status: None   Collection Time: 06/18/18  4:49 PM  Result Value Ref Range Status   Specimen Description BLOOD LEFT ANTECUBITAL  Final   Special Requests   Final    BOTTLES DRAWN AEROBIC AND ANAEROBIC Blood Culture adequate volume   Culture    Final    NO GROWTH 5 DAYS Performed at [facility], [place] [address], [place], [place] [address]    Report Status 06/23/2018 FINAL  Final  Aerobic Culture (superficial specimen)     Status: None   Collection Time: 06/19/18  1:04 PM  Result Value Ref Range Status   Specimen Description WOUND  Final   Special Requests PERINEAL  Final   Gram Stain   Final    NO WBC SEEN FEW YEAST Performed at [facility], [place] [address], [place], [place] [address]    Culture MODERATE CANDIDA ALBICANS  Final   Report Status 06/23/2018 FINAL  Final  Urine Culture     Status: Abnormal   Collection Time: 07/06/18  8:37 AM  Result Value Ref Range Status   Specimen Description URINE, RANDOM  Final   Special Requests NONE  Final   Culture (A)  Final    <10,000 COLONIES/mL INSIGNIFICANT GROWTH Performed at [facility], [place] [address] [place], [place] [address]    Report Status 07/07/2018 FINAL  Final    Studies/Results: Ct Lumbar Spine Wo Contrast  Result Date: 07/09/2018 CLINICAL DATA:  Low back pain. Assess stenosis. History of discitis osteomyelitis. EXAM: CT LUMBAR  SPINE WITHOUT CONTRAST TECHNIQUE: Multidetector CT imaging of the lumbar spine was performed without intravenous contrast administration. Multiplanar CT image reconstructions were also generated. COMPARISON:  MRI of the lumbar spine July 09, 2018 and CT abdomen and pelvis June 16, 2018. FINDINGS: SEGMENTATION: For the purposes of this report the last well-formed intervertebral disc space is reported as L5-S1. ALIGNMENT: Maintained lumbar lordosis. Minimal grade 1 L4-5 anterolisthesis. VERTEBRAE: Severe L1-2 disc height loss with subsidence, destructive bony changes L1 inferior and L2 superior endplates with mild L2 height loss, progressed from prior examination. Remaining lumbar vertebral bodies intact. Remaining disc heights preserved. PARASPINAL AND OTHER SOFT TISSUES: Abnormal soft tissue contiguous with RIGHT crus  of the diaphragm with punctate calcifications. Abnormal prevertebral soft tissue swelling with punctate calcifications. Through L2-3 with bilateral iliopsoas fat stranding. Mild calcific atherosclerosis aortoiliac vessels. DISC LEVELS: Soft tissue and punctate bony fragments at L1-2 result in severe canal stenosis with narrowed neural foramen which may affect the traversing L2 nerves. Neural foraminal stenosis better demonstrated on today's MRI. Progressive widening LEFT L4-5 facet at 3 mm. IMPRESSION: 1. 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 Severe L1-2 canal stenosis. 2. Prevertebral phlegmon/abscess better demonstrated on today's MRI; given presence of punctate calcifications, recommend exclusion of tuberculosis. 3. Increased widening of LEFT L4-5 facet may be infectious or inflammatory. Grade 1 L4-5 anterolisthesis without spondylolysis. 4. These results will be called to the ordering clinician or representative by the professional radiologist assistant, and communication documented in [employer] Dashboard. Electronically Signed   By: Elon Alas M.D.   On: 07/09/2018 19:23   Mr Lumbar Spine W Wo Contrast  Result Date: 07/09/2018 CLINICAL DATA:  Back pain.  Follow-up discitis osteomyelitis. EXAM: MRI LUMBAR SPINE WITHOUT AND WITH CONTRAST TECHNIQUE: Multiplanar and multiecho pulse sequences of the lumbar spine were obtained without and with intravenous contrast. CONTRAST:  7.7 cc Gadavist COMPARISON:  MRI of the lumbar spine June 16, 2018. FINDINGS: SEGMENTATION: For the purposes of this report, the last well-formed intervertebral disc is reported as L5-S1. ALIGNMENT: Maintained lumbar lordosis. Grade 1 L4-5 anterolisthesis without spondylolysis. VERTEBRAE: Slight interval expansion of L1-2 disc height loss with worsening rim enhancing fluid extending into the prevertebral and epidural space. Diffusely low T1 and bright T2 signal within the L1 and L2 vertebral  bodies. Remaining lumbar vertebral bodies and disc spaces intact. Scattered focal fat versus hemangiomas. CONUS MEDULLARIS AND CAUDA EQUINA: Conus medullaris deformity and faint suspected cord edema at L1-2. No nerve root clumping or leptomeningeal enhancement. PARASPINAL AND OTHER SOFT TISSUES:Moderately motion degraded axial sequences. Epidural abscess from T12-L2 0.6 x 0.7 x 6.3 cm (transverse by AP by cc) ventral epidural abscess. Dorsal epidural enhancement consistent with infection without focal fluid. Rim enhancing abscess contiguous with RIGHT crus of the diaphragm measuring to 2.1 cm with prevertebral phlegmon from T11-12 through L2-3. Bilateral iliopsoas muscle myositis without focal fluid collection though axial sequences motion degraded. Subcentimeter layering gallstones. DISC LEVELS: T11-12: Ventral epidural abscess measuring to 3 mm without canal stenosis or neural foraminal narrowing. T12-L1: Ventral epidural abscess measuring to 4 mm. Mild facet arthropathy and ligamentum flavum redundancy. Mild canal stenosis is slightly narrowed LEFT lateral recess which may affect the traversing LEFT L1 nerve. No neural foraminal narrowing. L1-2: Ventral epidural abscess. Enhancement within RIGHT facet. Moderate canal stenosis with narrowed lateral recesses which may affect the traversing L2 nerve. Soft tissue resulting in moderate to severe neural foraminal narrowing. L2-3: No disc bulge, canal stenosis nor neural foraminal narrowing. Mild facet  arthropathy. L3-4: Annular bulging asymmetric to the RIGHT, mild facet arthropathy. No canal stenosis. Mild RIGHT neural foraminal narrowing. L4-5: Anterolisthesis. Annular bulging, moderate to severe facet arthropathy and ligamentum flavum redundancy with bilateral facet effusions, enhancing on the LEFT. No canal stenosis. Moderate RIGHT neural foraminal narrowing. L5-S1: No disc bulge, canal stenosis nor neural foraminal narrowing. Mild facet arthropathy. IMPRESSION: 1.  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 Prevertebral abscess contiguous with RIGHT crus of the diaphragm measuring to 2.1 cm. Iliopsoas myositis. 2. Worsening moderate canal stenosis L1-2 due to epidural collection. Moderate to severe L1-2 neural foraminal effacement. 3. Worsening LEFT L4-5 facet enhancement may be reactive or infectious. Electronically Signed   By: Elon Alas M.D.   On: 07/09/2018 15:05      Assessment/Plan:  INTERVAL HISTORY: We were asked to come see the patient again because of worsening back pain with an MRI with worsening findings of discitis and now an epidural abscess   Principal Problem:   Lumbar discitis Active Problems:   Essential hypertension   Type 2 diabetes mellitus with hyperlipidemia (HCC)   Diabetic foot ulcer ([place])   Sinus tachycardia   Hyponatremia   Labile blood glucose   Acute on chronic anemia   Bacteremia    Ithzel Fedorchak is a 68 y.o. female admitted with 4 week history of worsening/severe back pain and weakness. Found to have L1-L2 discitis/osteomyelitis and paravertebral abscess. Her blood cultures  revealed MSSA. Disc aspiration ALSO REVEALED MSSA.  She had an ulcer on her foot which was thought to be the source of her infection though imaging there was no evidence of osteomyelitis.  She underwent transthoracic echocardiogram but not transesophageal echocardiogram at the time due to concerns about risk of anesthesia.  She is been getting IV antibiotics on the rehab floor since then.  In the interim she developed abrupt worsening of her lower back pain and was reimaged with MRI which showed worsening faction at the L1-L2 disc large epidural abscess, a prevertebral abscess along the diaphragm with worsening canal stenosis at L1 and L2 due to encroachment by the epidural infection.  There is also moderate to severe L1-L2 neural foraminal and effacement and also L4-L5 facet enhancement.  We were consulted along with  neurosurgery due to the fact that he had worsening of her infection despite being on a highly targeted specific potent appropriately dosed antistaphylococcal cephalosporin.  Neurosurgery do not want to operate on her due to the complexity of her anatomy and the desire not to place hardware in an infected space.  There were attempts made to broaden her antibiotics yesterday but that will not make any difference in her outcome at all.  Is not unusual when dealing with this highly virulent organism for infection to progress despite appropriate antibiotics where the bug is given the right drug.  When this occurs and parts of the body where surgery is much more simple such as for example in a osteomyelitis and a toe it is very easy for a surgeon to achieve source control by simply amputating the toe.  I can appreciate that the spine is a very delicate part of the body and the consequences of things going wrong here are obviously dire.  That being said if she has " failed antibiotics" and the patient's immune system has also "failed" then the only intervention that is not been a good chance to succeed or fail would be a Neurosurgical intervention.  I certainly do not want her to have  to have hardware in her back that is infected and requires her to be on prolonged antibiotics but she may need it for [place] to have control of this infection.  Fortunately she does not have any new focal neurological signs.  Absent any neurosurgical intervention we are left with restarting " clock on her antistaphylococcal antibiotics" and hoping with further time will see improvement in her abscesses that have now declared themselves.  She will require close neurological monitoring.  Thing that was not done that would affect antimicrobial management before would be to ensure that she does not have central nervous system involvement of her metastatic methicillin sensitive staph aureus infection.  Therefore I will obtain  an MRI of the brain with and without contrast to ensure she has no evidence of a brain abscess or septic emboli to the brain which is another possibility if she had undiagnosed endocarditis.  I am skeptical that we will find this and she has no headache or new neurological findings.  However I would not order the scan and if we find infection in the brain we will switch to nafcillin.       LOS: 14 days   Alcide Evener 07/10/2018, 10:27 AM

## 2018-07-10 NOTE — H&P (Signed)
History and Physical    Kaitlin Vargas DJS:[id number] DOB: 1951/04/22 DOA: 07/10/2018  PCP: [name], MD  Patient coming from: inpatient rehab   I have personally briefly reviewed patient's old medical records available.   Chief Complaint: back pain   HPI: Kaitlin Vargas is a 68 y.o. female with medical history significant of hypertension, hyperlipidemia, B12 deficiency, type 2 diabetes currently on insulin.  Patient had suffered from back pain for about 4 weeks when she was admitted earlier.  Patient presented to [facility] on 06/16/2018 with increasing back pain after recent treatment for bronchitis and COPD.  A CT scan of the spine showed osteomyelitis and she was transferred to [facility].  She had cardiac arrest during procedure requiring CPR for about 1 to 2 minutes.  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  Blood cultures showed MSSA.  She does have generalized weakness on both lower extremities and was followed by ID and neurosurgery.  Echo was negative for endocarditis.  On 06/26/2018 she was transferred to inpatient rehab to continue inpatient rehab with IV antibiotics and PICC line on right arm.  Patient does have pain issues and she is on long-acting morphine as well as oxycodone.  Since last 1 day, patient is started complaining of increasing back pain.  MRI of the lumbar spine with and without contrast showed worsening L1-2 discitis osteomyelitis with last epidural abscess.  Also showed prevertebral abscess.  ID and neurosurgery consulted.  After much discussion, neurosurgery decided to explore the abscess as it is not responding to antibiotics and requested transfer to medical floor. On my evaluation, patient complains that she is not able to walk on legs and that is for more than a month. Patient was having severe lower back pain, 10 out of 10, worse on mobility without radiation and no associated incontinence, nausea  vomiting. Also noted blood sugars less than 40 in the evening on 07/08/2018 and 07/09/2018, Levemir dose was reduced from 25 to 20 units in the morning.  Review of Systems: As per HPI otherwise 10 point review of systems negative.  No fever or chills.  No flulike symptoms.  Past Medical History:  Diagnosis Date  . Anemia   . Diabetes mellitus without complication ([place])   . Hyperlipidemia   . Hypertension   . Peripheral neuropathy     Past Surgical History:  Procedure Laterality Date  . ABDOMINAL HYSTERECTOMY    . IR LUMBAR DISC ASPIRATION W/IMG GUIDE  06/18/2018     reports that she has never smoked. She has never used smokeless tobacco. She reports that she does not drink alcohol or use drugs.  Allergies  Allergen Reactions  . Sulfa Antibiotics Rash    Family History  Problem Relation Age of Onset  . Cancer Mother   . Stroke Mother   . Cancer Father   . Leukemia Father      Prior to Admission medications   Medication Sig Start Date End Date Taking? Authorizing Provider  albuterol (PROVENTIL HFA;VENTOLIN HFA) 108 (90 Base) MCG/ACT inhaler Inhale 2 puffs into the lungs every 6 (six) hours as needed for wheezing or shortness of breath. 06/09/18   Manuella Ghazi, Pratik D, DO  albuterol (PROVENTIL) (2.5 MG/3ML) 0.083% nebulizer solution Take 3 mLs (2.5 mg total) by nebulization every 2 (two) hours as needed for wheezing or shortness of breath. 07/10/18   Angiulli, Lavon Paganini, PA-C  aspirin 81 MG chewable tablet Chew 81 mg by mouth  every morning.     [provider]  bethanechol (URECHOLINE) 10 MG tablet Take 1 tablet (10 mg total) by mouth 3 (three) times daily. 07/10/18   Angiulli, Lavon Paganini, PA-C  bisacodyl (DULCOLAX) 10 MG suppository Place 1 suppository (10 mg total) rectally daily as needed for moderate constipation. 07/10/18   Angiulli, Lavon Paganini, PA-C  ceFAZolin (ANCEF) IVPB Inject 2 g into the vein every 8 (eight) hours. Indication: MSSA discitis/osteomyelitis Last Day of  Therapy: 08/13/2018 Labs - Once weekly:  CBC/D and BMP, Labs - Every other week:  ESR and CRP 06/26/18 08/19/18  Shelly Coss, MD  furosemide (LASIX) 20 MG tablet Take 2 tablets (40 mg total) by mouth daily. 06/27/18   Shelly Coss, MD  glipiZIDE (GLUCOTROL XL) 10 MG 24 hr tablet Take 10 mg by mouth 2 (two) times daily.  04/28/18   [provider]  heparin 5000 UNIT/ML injection Inject 1 mL (5,000 Units total) into the skin every 8 (eight) hours. 07/10/18   Angiulli, Lavon Paganini, PA-C  HYDROcodone-acetaminophen (NORCO) 10-325 MG tablet Take 1 tablet by mouth every 4 (four) hours as needed for moderate pain. 06/26/18   Shelly Coss, MD  insulin aspart (NOVOLOG) 100 UNIT/ML injection Inject 0-9 Units into the skin 3 (three) times daily with meals. 07/10/18   Angiulli, Lavon Paganini, PA-C  insulin glargine (LANTUS) 100 UNIT/ML injection Inject 0.2 mLs (20 Units total) into the skin daily. 07/11/18   Angiulli, Lavon Paganini, PA-C  ketoconazole (NIZORAL) 2 % cream Apply topically 2 (two) times daily. 07/10/18   Angiulli, Lavon Paganini, PA-C  lisinopril (PRINIVIL,ZESTRIL) 20 MG tablet Take 10 mg by mouth daily.  02/06/18   [provider]  metFORMIN (GLUCOPHAGE) 1000 MG tablet Take 1,000 mg by mouth 2 (two) times daily with a meal.  07/02/17 07/02/18  [provider]  methocarbamol (ROBAXIN) 500 MG tablet Take 1 tablet (500 mg total) by mouth every 6 (six) hours as needed for muscle spasms. 06/26/18   Shelly Coss, MD  methocarbamol (ROBAXIN) 750 MG tablet Take 1 tablet (750 mg total) by mouth every 6 (six) hours. 07/10/18   Angiulli, Lavon Paganini, PA-C  metoprolol tartrate (LOPRESSOR) 25 MG tablet Take 1 tablet (25 mg total) by mouth 2 (two) times daily. 06/26/18   Shelly Coss, MD  morphine (MS CONTIN) 30 MG 12 hr tablet Take 1 tablet (30 mg total) by mouth every 12 (twelve) hours. 07/10/18   Angiulli, Lavon Paganini, PA-C  Multiple Vitamin (THERA) TABS Take 1 tablet by mouth daily.     [provider]   pantoprazole (PROTONIX) 40 MG tablet Take 1 tablet (40 mg total) by mouth daily. 06/09/18 06/09/19  Manuella Ghazi, Pratik D, DO  pioglitazone (ACTOS) 30 MG tablet Take 30 mg by mouth daily.  04/28/18   [provider]  senna-docusate (SENOKOT-S) 8.6-50 MG tablet Take 2 tablets by mouth at bedtime. 07/10/18   Angiulli, Lavon Paganini, PA-C  vitamin B-12 (CYANOCOBALAMIN) 1000 MCG tablet Take 1 tablet (1,000 mcg total) by mouth daily for 30 days. 07/10/18 08/09/18  Cathlyn Parsons, PA-C    Physical Exam: Vitals:   07/10/18 1350  BP: (!) 148/75  Pulse: 99  Resp: 16  Temp: 99.2 F (37.3 C)  TempSrc: Oral  SpO2: 97%    Constitutional: NAD, calm, comfortable Vitals:   07/10/18 1350  BP: (!) 148/75  Pulse: 99  Resp: 16  Temp: 99.2 F (37.3 C)  TempSrc: Oral  SpO2: 97%   Eyes: PERRL, lids  and conjunctivae normal Not in any distress.  On room air.  Chronically sick looking. ENMT: Mucous membranes are moist. Posterior pharynx clear of any exudate or lesions.Normal dentition.  Neck: normal, supple, no masses, no thyromegaly Respiratory: clear to auscultation bilaterally, no wheezing, no crackles. Normal respiratory effort. No accessory muscle use.  Cardiovascular: Regular rate and rhythm, no murmurs / rubs / gallops. No extremity edema. 2+ pedal pulses. No carotid bruits.  Abdomen: no tenderness, no masses palpated. No hepatosplenomegaly. Bowel sounds positive.  Musculoskeletal: no clubbing / cyanosis. No joint deformity upper and lower extremities. Good ROM, no contractures. Normal muscle tone.  Skin: no rashes, lesions, ulcers. No induration Neurologic: CN 2-12 grossly intact. Sensation intact, DTR normal. Strength 5/5 in both upper extremities. Strength 2-3/5 on both lower extremities.  Patient complains of pain on mobility. Psychiatric: Normal judgment and insight. Alert and oriented x 3. Normal mood.     Labs on Admission: I have personally reviewed following labs and imaging  studies  CBC: Recent Labs  Lab 07/04/18 0432 07/05/18 0334 07/08/18 0515  WBC 5.3 6.8 4.6  HGB 7.2* 10.6* 10.0*  HCT 24.4* 33.4* 32.5*  MCV 106.6* 99.1 102.2*  PLT 283 277 979   Basic Metabolic Panel: Recent Labs  Lab 07/08/18 0515  NA 135  K 3.5  CL 97*  CO2 29  GLUCOSE 108*  BUN 15  CREATININE 0.60  CALCIUM 8.3*   GFR: Estimated Creatinine Clearance: 58.1 mL/min (by C-G formula based on SCr of 0.6 mg/dL). Liver Function Tests: Recent Labs  Lab 07/08/18 0515  AST 14*  ALT <5  ALKPHOS 121  BILITOT 0.5  PROT 5.7*  ALBUMIN 1.9*   No results for input(s): LIPASE, AMYLASE in the last 168 hours. No results for input(s): AMMONIA in the last 168 hours. Coagulation Profile: No results for input(s): INR, PROTIME in the last 168 hours. Cardiac Enzymes: No results for input(s): CKTOTAL, CKMB, CKMBINDEX, TROPONINI in the last 168 hours. BNP (last 3 results) No results for input(s): PROBNP in the last 8760 hours. HbA1C: No results for input(s): HGBA1C in the last 72 hours. CBG: Recent Labs  Lab 07/09/18 1709 07/09/18 2116 07/09/18 2154 07/10/18 0622 07/10/18 1154  GLUCAP 88 47* 83 84 87   Lipid Profile: No results for input(s): CHOL, HDL, LDLCALC, TRIG, CHOLHDL, LDLDIRECT in the last 72 hours. Thyroid Function Tests: No results for input(s): TSH, T4TOTAL, FREET4, T3FREE, THYROIDAB in the last 72 hours. Anemia Panel: No results for input(s): VITAMINB12, FOLATE, FERRITIN, TIBC, IRON, RETICCTPCT in the last 72 hours. Urine analysis:    Component Value Date/Time   COLORURINE YELLOW 07/06/2018 1332   APPEARANCEUR CLEAR 07/06/2018 1332   LABSPEC 1.011 07/06/2018 1332   PHURINE 7.0 07/06/2018 1332   GLUCOSEU 50 (A) 07/06/2018 1332   HGBUR NEGATIVE 07/06/2018 1332   BILIRUBINUR NEGATIVE 07/06/2018 1332   KETONESUR NEGATIVE 07/06/2018 1332   PROTEINUR NEGATIVE 07/06/2018 1332   NITRITE NEGATIVE 07/06/2018 1332   LEUKOCYTESUR NEGATIVE 07/06/2018 1332     Radiological Exams on Admission: Ct Lumbar Spine Wo Contrast  Result Date: 07/09/2018 CLINICAL DATA:  Low back pain. Assess stenosis. History of discitis osteomyelitis. EXAM: CT LUMBAR SPINE WITHOUT CONTRAST TECHNIQUE: Multidetector CT imaging of the lumbar spine was performed without intravenous contrast administration. Multiplanar CT image reconstructions were also generated. COMPARISON:  MRI of the lumbar spine July 09, 2018 and CT abdomen and pelvis June 16, 2018. FINDINGS: SEGMENTATION: For the purposes of this report the last well-formed intervertebral disc  space is reported as L5-S1. ALIGNMENT: Maintained lumbar lordosis. Minimal grade 1 L4-5 anterolisthesis. VERTEBRAE: Severe L1-2 disc height loss with subsidence, destructive bony changes L1 inferior and L2 superior endplates with mild L2 height loss, progressed from prior examination. Remaining lumbar vertebral bodies intact. Remaining disc heights preserved. PARASPINAL AND OTHER SOFT TISSUES: Abnormal soft tissue contiguous with RIGHT crus of the diaphragm with punctate calcifications. Abnormal prevertebral soft tissue swelling with punctate calcifications. Through L2-3 with bilateral iliopsoas fat stranding. Mild calcific atherosclerosis aortoiliac vessels. DISC LEVELS: Soft tissue and punctate bony fragments at L1-2 result in severe canal stenosis with narrowed neural foramen which may affect the traversing L2 nerves. Neural foraminal stenosis better demonstrated on today's MRI. Progressive widening LEFT L4-5 facet at 3 mm. IMPRESSION: 1. 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 Severe L1-2 canal stenosis. 2. Prevertebral phlegmon/abscess better demonstrated on today's MRI; given presence of punctate calcifications, recommend exclusion of tuberculosis. 3. Increased widening of LEFT L4-5 facet may be infectious or inflammatory. Grade 1 L4-5 anterolisthesis without spondylolysis. 4. These results will be  called to the ordering clinician or representative by the professional radiologist assistant, and communication documented in [facility] Dashboard. Electronically Signed   By: Elon Alas M.D.   On: 07/09/2018 19:23   Mr Jeri Cos [facility] Contrast  Result Date: 07/10/2018 CLINICAL DATA:  Encephalopathy.  Rule out infection EXAM: MRI HEAD WITHOUT AND WITH CONTRAST TECHNIQUE: Multiplanar, multiecho pulse sequences of the brain and surrounding structures were obtained without and with intravenous contrast. CONTRAST:  7.5 mL Gadovist IV COMPARISON:  None. FINDINGS: Brain: Generalized atrophy without hydrocephalus. Mild chronic changes in the white matter. No acute infarct. Negative for hemorrhage or mass. No edema. Normal enhancement postcontrast infusion. No enhancing mass or abscess. Leptomeningeal enhancement normal. Vascular: Normal vascular flow voids. Skull and upper cervical spine: Negative Sinuses/Orbits: Mild mucosal edema paranasal sinuses. Bilateral mastoid effusion. Bilateral cataract surgery Other: None IMPRESSION: No acute intracranial abnormality Mild atrophy and mild chronic microvascular ischemic change in the white matter. Electronically Signed   By: Franchot Gallo M.D.   On: 07/10/2018 12:31   Mr Lumbar Spine W Wo Contrast  Result Date: 07/09/2018 CLINICAL DATA:  Back pain.  Follow-up discitis osteomyelitis. EXAM: MRI LUMBAR SPINE WITHOUT AND WITH CONTRAST TECHNIQUE: Multiplanar and multiecho pulse sequences of the lumbar spine were obtained without and with intravenous contrast. CONTRAST:  7.7 cc Gadavist COMPARISON:  MRI of the lumbar spine June 16, 2018. FINDINGS: SEGMENTATION: For the purposes of this report, the last well-formed intervertebral disc is reported as L5-S1. ALIGNMENT: Maintained lumbar lordosis. Grade 1 L4-5 anterolisthesis without spondylolysis. VERTEBRAE: Slight interval expansion of L1-2 disc height loss with worsening rim enhancing fluid extending into the prevertebral and  epidural space. Diffusely low T1 and bright T2 signal within the L1 and L2 vertebral bodies. Remaining lumbar vertebral bodies and disc spaces intact. Scattered focal fat versus hemangiomas. CONUS MEDULLARIS AND CAUDA EQUINA: Conus medullaris deformity and faint suspected cord edema at L1-2. No nerve root clumping or leptomeningeal enhancement. PARASPINAL AND OTHER SOFT TISSUES:Moderately motion degraded axial sequences. Epidural abscess from T12-L2 0.6 x 0.7 x 6.3 cm (transverse by AP by cc) ventral epidural abscess. Dorsal epidural enhancement consistent with infection without focal fluid. Rim enhancing abscess contiguous with RIGHT crus of the diaphragm measuring to 2.1 cm with prevertebral phlegmon from T11-12 through L2-3. Bilateral iliopsoas muscle myositis without focal fluid collection though axial sequences motion degraded. Subcentimeter layering gallstones. DISC LEVELS: T11-12: Ventral epidural abscess measuring to  3 mm without canal stenosis or neural foraminal narrowing. T12-L1: Ventral epidural abscess measuring to 4 mm. Mild facet arthropathy and ligamentum flavum redundancy. Mild canal stenosis is slightly narrowed LEFT lateral recess which may affect the traversing LEFT L1 nerve. No neural foraminal narrowing. L1-2: Ventral epidural abscess. Enhancement within RIGHT facet. Moderate canal stenosis with narrowed lateral recesses which may affect the traversing L2 nerve. Soft tissue resulting in moderate to severe neural foraminal narrowing. L2-3: No disc bulge, canal stenosis nor neural foraminal narrowing. Mild facet arthropathy. L3-4: Annular bulging asymmetric to the RIGHT, mild facet arthropathy. No canal stenosis. Mild RIGHT neural foraminal narrowing. L4-5: Anterolisthesis. Annular bulging, moderate to severe facet arthropathy and ligamentum flavum redundancy with bilateral facet effusions, enhancing on the LEFT. No canal stenosis. Moderate RIGHT neural foraminal narrowing. L5-S1: No disc bulge,  canal stenosis nor neural foraminal narrowing. Mild facet arthropathy. IMPRESSION: 1. 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 Prevertebral abscess contiguous with RIGHT crus of the diaphragm measuring to 2.1 cm. Iliopsoas myositis. 2. Worsening moderate canal stenosis L1-2 due to epidural collection. Moderate to severe L1-2 neural foraminal effacement. 3. Worsening LEFT L4-5 facet enhancement may be reactive or infectious. Electronically Signed   By: Elon Alas M.D.   On: 07/09/2018 15:05    EKG: Independently reviewed.  EKG from 06/17/2018 reviewed and is essentially normal with low voltage.  Assessment/Plan Principal Problem:   MSSA bacteremia Active Problems:   Essential hypertension   Type 2 diabetes mellitus with hyperlipidemia (HCC)   Diabetic foot ulcer ([place])   Epidural abscess     1.  Lumbar discitis, vertebral osteomyelitis with worsening abscess formation causing worsening pain and central canal stenosis: Agree with transfer to medical unit. Continue Ancef as planned by infectious disease. Further surgical management as per surgery, they are planning for posterior instrumentation and decompression, will keep n.p.o. past midnight. Continue neurochecks and monitoring.  2.  Type 2 diabetes with hyperglycemic episodes: Currently stabilized.  Her long-acting insulin doses have been reduced.  We will continue close monitoring.  3.  Severe back pain: Recurrent acute on chronic back pain.  Continue adequate long-acting as well as short-acting narcotics.  She will need more rehab after the procedure.  4.  Diabetic foot ulcer: Healed.  5.  Anemia of chronic disease: Currently hemoglobin is stable.  Agree with transfer to medical unit for anticipated procedure tomorrow.  DVT prophylaxis: SCDs.  Full code. Code Status: Full code. Family Communication: No family at bedside. Disposition Plan: Inpatient rehab. Consults called: Neurosurgery, infectious  disease Admission status: Inpatient.   Barb Merino MD [facility] Pager [phone]  If 7PM-7AM, please contact night-coverage [email or web address] Password [facility]  07/10/2018, 2:29 PM

## 2018-07-11 ENCOUNTER — Inpatient Hospital Stay (HOSPITAL_COMMUNITY): Payer: Medicare HMO | Admitting: Occupational Therapy

## 2018-07-11 ENCOUNTER — Encounter (HOSPITAL_COMMUNITY): Payer: Self-pay | Admitting: Anesthesiology

## 2018-07-11 ENCOUNTER — Inpatient Hospital Stay (HOSPITAL_COMMUNITY): Payer: Medicare HMO

## 2018-07-11 ENCOUNTER — Encounter (HOSPITAL_COMMUNITY)
Admission: AD | Disposition: A | Discharge status: Rehabilitation Facility | Payer: Self-pay | Source: Ambulatory Visit | Attending: Family Medicine

## 2018-07-11 ENCOUNTER — Inpatient Hospital Stay (HOSPITAL_COMMUNITY): Payer: Medicare HMO | Admitting: Anesthesiology

## 2018-07-11 ENCOUNTER — Inpatient Hospital Stay (HOSPITAL_COMMUNITY): Admission: RE | Admit: 2018-07-11 | Payer: Medicare HMO | Source: Home / Self Care | Admitting: Neurological Surgery

## 2018-07-11 ENCOUNTER — Other Ambulatory Visit: Payer: Self-pay

## 2018-07-11 DIAGNOSIS — M4646 Discitis, unspecified, lumbar region: Secondary | ICD-10-CM

## 2018-07-11 DIAGNOSIS — G061 Intraspinal abscess and granuloma: Principal | ICD-10-CM

## 2018-07-11 DIAGNOSIS — B9561 Methicillin susceptible Staphylococcus aureus infection as the cause of diseases classified elsewhere: Secondary | ICD-10-CM

## 2018-07-11 DIAGNOSIS — M4626 Osteomyelitis of vertebra, lumbar region: Secondary | ICD-10-CM

## 2018-07-11 DIAGNOSIS — M48061 Spinal stenosis, lumbar region without neurogenic claudication: Secondary | ICD-10-CM

## 2018-07-11 DIAGNOSIS — Z981 Arthrodesis status: Secondary | ICD-10-CM

## 2018-07-11 HISTORY — PX: POSTERIOR LUMBAR FUSION 4 LEVEL: SHX6037

## 2018-07-11 LAB — GLUCOSE, CAPILLARY
GLUCOSE-CAPILLARY: 198 mg/dL — AB (ref 70–99)
GLUCOSE-CAPILLARY: 162 mg/dL — AB (ref 70–99)
GLUCOSE-CAPILLARY: 224 mg/dL — AB (ref 70–99)
Glucose-Capillary: 183 mg/dL — ABNORMAL HIGH (ref 70–99)
Glucose-Capillary: 199 mg/dL — ABNORMAL HIGH (ref 70–99)
Glucose-Capillary: 212 mg/dL — ABNORMAL HIGH (ref 70–99)

## 2018-07-11 SURGERY — POSTERIOR LUMBAR FUSION 4 LEVEL
Anesthesia: General | Site: Back

## 2018-07-11 MED ORDER — PHENOL 1.4 % MT LIQD: 1.0000 | OROMUCOSAL | Status: DC | PRN

## 2018-07-11 MED ORDER — PANTOPRAZOLE SODIUM 40 MG PO TBEC
40.0000 mg | DELAYED_RELEASE_TABLET | Freq: Every day | ORAL | Status: DC
  Administered 2018-07-12 – 2018-07-16 (×5): 40 mg via ORAL
  Filled 2018-07-11 (×5): qty 1

## 2018-07-11 MED ORDER — ALBUMIN HUMAN 5 % IV SOLN
INTRAVENOUS | Status: DC | PRN
  Administered 2018-07-11 (×2): via INTRAVENOUS

## 2018-07-11 MED ORDER — ACETAMINOPHEN 160 MG/5ML PO SOLN: 325.0000 mg | Freq: Once | ORAL | Status: DC

## 2018-07-11 MED ORDER — BISACODYL 10 MG RE SUPP
10.0000 mg | Freq: Every day | RECTAL | Status: DC | PRN
  Filled 2018-07-11: qty 1

## 2018-07-11 MED ORDER — FENTANYL CITRATE (PF) 100 MCG/2ML IJ SOLN
INTRAMUSCULAR | Status: DC | PRN
  Administered 2018-07-11 (×4): 50 ug via INTRAVENOUS
  Administered 2018-07-11: 100 ug via INTRAVENOUS

## 2018-07-11 MED ORDER — SENNOSIDES-DOCUSATE SODIUM 8.6-50 MG PO TABS: 2.0000 | ORAL_TABLET | Freq: Every day | ORAL | Status: DC

## 2018-07-11 MED ORDER — CHLORHEXIDINE GLUCONATE CLOTH 2 % EX PADS: 6.0000 | MEDICATED_PAD | Freq: Once | CUTANEOUS | Status: DC

## 2018-07-11 MED ORDER — INSULIN ASPART 100 UNIT/ML ~~LOC~~ SOLN
0.0000 [IU] | Freq: Three times a day (TID) | SUBCUTANEOUS | Status: DC
  Administered 2018-07-12: 5 [IU] via SUBCUTANEOUS
  Administered 2018-07-12: 15 [IU] via SUBCUTANEOUS
  Administered 2018-07-13: 8 [IU] via SUBCUTANEOUS
  Administered 2018-07-13: 3 [IU] via SUBCUTANEOUS
  Administered 2018-07-14: 8 [IU] via SUBCUTANEOUS
  Administered 2018-07-14 (×2): 3 [IU] via SUBCUTANEOUS
  Administered 2018-07-15: 8 [IU] via SUBCUTANEOUS
  Administered 2018-07-15: 3 [IU] via SUBCUTANEOUS
  Administered 2018-07-16: 2 [IU] via SUBCUTANEOUS
  Administered 2018-07-16: 3 [IU] via SUBCUTANEOUS

## 2018-07-11 MED ORDER — HYDROMORPHONE HCL 1 MG/ML IJ SOLN
0.5000 mg | INTRAMUSCULAR | Status: DC | PRN
  Administered 2018-07-11: 0.5 mg via INTRAVENOUS
  Filled 2018-07-11: qty 1

## 2018-07-11 MED ORDER — ACETAMINOPHEN 325 MG PO TABS
650.0000 mg | ORAL_TABLET | ORAL | Status: DC | PRN
  Administered 2018-07-12: 650 mg via ORAL
  Filled 2018-07-11: qty 2

## 2018-07-11 MED ORDER — BUPIVACAINE HCL (PF) 0.25 % IJ SOLN
INTRAMUSCULAR | Status: DC | PRN
  Administered 2018-07-11: 10 mL

## 2018-07-11 MED ORDER — ONDANSETRON HCL 4 MG/2ML IJ SOLN
INTRAMUSCULAR | Status: DC | PRN
  Administered 2018-07-11: 4 mg via INTRAVENOUS

## 2018-07-11 MED ORDER — RIFAMPIN 300 MG PO CAPS
300.0000 mg | ORAL_CAPSULE | Freq: Two times a day (BID) | ORAL | Status: DC
  Administered 2018-07-11 – 2018-07-16 (×10): 300 mg via ORAL
  Filled 2018-07-11 (×11): qty 1

## 2018-07-11 MED ORDER — POTASSIUM CHLORIDE IN NACL 20-0.9 MEQ/L-% IV SOLN
INTRAVENOUS | Status: DC
  Administered 2018-07-11 – 2018-07-15 (×5): via INTRAVENOUS
  Filled 2018-07-11 (×7): qty 1000

## 2018-07-11 MED ORDER — VITAMIN B-12 1000 MCG PO TABS
1000.0000 ug | ORAL_TABLET | Freq: Every day | ORAL | Status: DC
  Administered 2018-07-12 – 2018-07-16 (×5): 1000 ug via ORAL
  Filled 2018-07-11 (×5): qty 1

## 2018-07-11 MED ORDER — MEPERIDINE HCL 50 MG/ML IJ SOLN: 6.2500 mg | INTRAMUSCULAR | Status: DC | PRN

## 2018-07-11 MED ORDER — ACETAMINOPHEN 325 MG PO TABS: 325.0000 mg | ORAL_TABLET | Freq: Once | ORAL | Status: DC

## 2018-07-11 MED ORDER — FUROSEMIDE 40 MG PO TABS
40.0000 mg | ORAL_TABLET | Freq: Every day | ORAL | Status: DC
  Administered 2018-07-12 – 2018-07-16 (×5): 40 mg via ORAL
  Filled 2018-07-11 (×5): qty 1

## 2018-07-11 MED ORDER — SODIUM CHLORIDE (PF) 0.9 % IJ SOLN
INTRAMUSCULAR | Status: DC | PRN
  Administered 2018-07-11: 5 mL

## 2018-07-11 MED ORDER — HYDROMORPHONE HCL 1 MG/ML IJ SOLN: 0.2500 mg | INTRAMUSCULAR | Status: DC | PRN

## 2018-07-11 MED ORDER — ONDANSETRON HCL 4 MG PO TABS: 4.0000 mg | ORAL_TABLET | Freq: Four times a day (QID) | ORAL | Status: DC | PRN

## 2018-07-11 MED ORDER — ONDANSETRON HCL 4 MG/2ML IJ SOLN: 4.0000 mg | Freq: Four times a day (QID) | INTRAMUSCULAR | Status: DC | PRN

## 2018-07-11 MED ORDER — BETHANECHOL CHLORIDE 10 MG PO TABS
10.0000 mg | ORAL_TABLET | Freq: Three times a day (TID) | ORAL | Status: DC
  Administered 2018-07-12 – 2018-07-16 (×14): 10 mg via ORAL
  Filled 2018-07-11 (×14): qty 1

## 2018-07-11 MED ORDER — METOPROLOL TARTRATE 25 MG PO TABS
25.0000 mg | ORAL_TABLET | Freq: Two times a day (BID) | ORAL | Status: DC
  Administered 2018-07-12 – 2018-07-16 (×9): 25 mg via ORAL
  Filled 2018-07-11 (×9): qty 1

## 2018-07-11 MED ORDER — SODIUM CHLORIDE 0.9 % IV SOLN
INTRAVENOUS | Status: DC | PRN
  Administered 2018-07-11: 16:00:00

## 2018-07-11 MED ORDER — SUGAMMADEX SODIUM 200 MG/2ML IV SOLN
INTRAVENOUS | Status: DC | PRN
  Administered 2018-07-11: 180 mg via INTRAVENOUS

## 2018-07-11 MED ORDER — MENTHOL 3 MG MT LOZG: 1.0000 | LOZENGE | OROMUCOSAL | Status: DC | PRN

## 2018-07-11 MED ORDER — SODIUM CHLORIDE 0.9% FLUSH
3.0000 mL | Freq: Two times a day (BID) | INTRAVENOUS | Status: DC
  Administered 2018-07-11 – 2018-07-16 (×7): 3 mL via INTRAVENOUS

## 2018-07-11 MED ORDER — ACETAMINOPHEN 650 MG RE SUPP: 650.0000 mg | RECTAL | Status: DC | PRN

## 2018-07-11 MED ORDER — LIDOCAINE 2% (20 MG/ML) 5 ML SYRINGE
INTRAMUSCULAR | Status: DC | PRN
  Administered 2018-07-11: 80 mg via INTRAVENOUS

## 2018-07-11 MED ORDER — ROCURONIUM BROMIDE 10 MG/ML (PF) SYRINGE
PREFILLED_SYRINGE | INTRAVENOUS | Status: DC | PRN
  Administered 2018-07-11: 20 mg via INTRAVENOUS
  Administered 2018-07-11: 10 mg via INTRAVENOUS
  Administered 2018-07-11: 50 mg via INTRAVENOUS

## 2018-07-11 MED ORDER — SODIUM CHLORIDE 0.9 % IV SOLN
INTRAVENOUS | Status: DC | PRN
  Administered 2018-07-11: 30 ug/min via INTRAVENOUS

## 2018-07-11 MED ORDER — THROMBIN 5000 UNITS EX SOLR
OROMUCOSAL | Status: DC | PRN
  Administered 2018-07-11: 16:00:00 via TOPICAL

## 2018-07-11 MED ORDER — SCOPOLAMINE 1 MG/3DAYS TD PT72
1.0000 | MEDICATED_PATCH | TRANSDERMAL | Status: DC
  Administered 2018-07-11: 1 via TRANSDERMAL

## 2018-07-11 MED ORDER — LACTATED RINGERS IV SOLN
INTRAVENOUS | Status: DC
  Administered 2018-07-11: 15:00:00 via INTRAVENOUS

## 2018-07-11 MED ORDER — HEPARIN SODIUM (PORCINE) 1000 UNIT/ML IJ SOLN
INTRAMUSCULAR | Status: AC
  Filled 2018-07-11: qty 1

## 2018-07-11 MED ORDER — ALBUTEROL SULFATE (2.5 MG/3ML) 0.083% IN NEBU: 2.5000 mg | INHALATION_SOLUTION | RESPIRATORY_TRACT | Status: DC | PRN

## 2018-07-11 MED ORDER — CEFAZOLIN SODIUM-DEXTROSE 2-4 GM/100ML-% IV SOLN
INTRAVENOUS | Status: AC
  Filled 2018-07-11: qty 100

## 2018-07-11 MED ORDER — 0.9 % SODIUM CHLORIDE (POUR BTL) OPTIME
TOPICAL | Status: DC | PRN
  Administered 2018-07-11: 1000 mL

## 2018-07-11 MED ORDER — HEPARIN SODIUM (PORCINE) 1000 UNIT/ML IJ SOLN
INTRAMUSCULAR | Status: DC | PRN
  Administered 2018-07-11: 5000 [IU]

## 2018-07-11 MED ORDER — ACETAMINOPHEN 10 MG/ML IV SOLN
INTRAVENOUS | Status: DC | PRN
  Administered 2018-07-11: 1000 mg via INTRAVENOUS

## 2018-07-11 MED ORDER — PROPOFOL 10 MG/ML IV BOLUS
INTRAVENOUS | Status: DC | PRN
  Administered 2018-07-11: 200 mg via INTRAVENOUS

## 2018-07-11 MED ORDER — METHOCARBAMOL 1000 MG/10ML IJ SOLN
500.0000 mg | Freq: Four times a day (QID) | INTRAVENOUS | Status: DC | PRN
  Filled 2018-07-11: qty 5

## 2018-07-11 MED ORDER — ASPIRIN 81 MG PO CHEW
81.0000 mg | CHEWABLE_TABLET | Freq: Every morning | ORAL | Status: DC
  Administered 2018-07-12 – 2018-07-16 (×5): 81 mg via ORAL
  Filled 2018-07-11 (×5): qty 1

## 2018-07-11 MED ORDER — DEXAMETHASONE SODIUM PHOSPHATE 10 MG/ML IJ SOLN
10.0000 mg | INTRAMUSCULAR | Status: AC
  Administered 2018-07-11: 10 mg via INTRAVENOUS
  Filled 2018-07-11: qty 1

## 2018-07-11 MED ORDER — THROMBIN 20000 UNITS EX SOLR
CUTANEOUS | Status: DC | PRN
  Administered 2018-07-11: 16:00:00 via TOPICAL

## 2018-07-11 MED ORDER — OXYCODONE HCL 5 MG PO TABS
5.0000 mg | ORAL_TABLET | ORAL | Status: DC | PRN
  Administered 2018-07-12 (×4): 5 mg via ORAL
  Filled 2018-07-11 (×4): qty 1

## 2018-07-11 MED ORDER — SCOPOLAMINE 1 MG/3DAYS TD PT72
MEDICATED_PATCH | TRANSDERMAL | Status: AC
  Filled 2018-07-11: qty 1

## 2018-07-11 MED ORDER — CEFAZOLIN SODIUM-DEXTROSE 2-4 GM/100ML-% IV SOLN
2.0000 g | Freq: Three times a day (TID) | INTRAVENOUS | Status: DC
  Administered 2018-07-11 – 2018-07-16 (×13): 2 g via INTRAVENOUS
  Filled 2018-07-11 (×16): qty 100

## 2018-07-11 MED ORDER — ACETAMINOPHEN 10 MG/ML IV SOLN: 1000.0000 mg | Freq: Once | INTRAVENOUS | Status: DC | PRN

## 2018-07-11 MED ORDER — SODIUM CHLORIDE 0.9 % IV SOLN: 250.0000 mL | INTRAVENOUS | Status: DC

## 2018-07-11 MED ORDER — LACTATED RINGERS IV SOLN: INTRAVENOUS | Status: DC

## 2018-07-11 MED ORDER — CEFAZOLIN IV (FOR PTA / DISCHARGE USE ONLY): 2.0000 g | Freq: Three times a day (TID) | INTRAVENOUS | Status: DC

## 2018-07-11 MED ORDER — CEFAZOLIN SODIUM-DEXTROSE 2-4 GM/100ML-% IV SOLN
2.0000 g | INTRAVENOUS | Status: DC
  Filled 2018-07-11: qty 100

## 2018-07-11 MED ORDER — METHOCARBAMOL 500 MG PO TABS
500.0000 mg | ORAL_TABLET | Freq: Four times a day (QID) | ORAL | Status: DC | PRN
  Administered 2018-07-12 – 2018-07-15 (×8): 500 mg via ORAL
  Filled 2018-07-11 (×8): qty 1

## 2018-07-11 MED ORDER — SODIUM CHLORIDE 0.9% FLUSH: 3.0000 mL | INTRAVENOUS | Status: DC | PRN

## 2018-07-11 MED ORDER — SENNA 8.6 MG PO TABS
1.0000 | ORAL_TABLET | Freq: Two times a day (BID) | ORAL | Status: DC
  Administered 2018-07-12 – 2018-07-16 (×9): 8.6 mg via ORAL
  Filled 2018-07-11 (×9): qty 1

## 2018-07-11 MED ORDER — VANCOMYCIN HCL 1000 MG IV SOLR
INTRAVENOUS | Status: DC | PRN
  Administered 2018-07-11: 1000 mg via TOPICAL

## 2018-07-11 MED ORDER — ACD FORMULA A 0.73-2.45-2.2 GM/100ML VI SOLN
Status: AC | PRN
  Administered 2018-07-11: 10 mL

## 2018-07-11 MED ORDER — PROMETHAZINE HCL 25 MG/ML IJ SOLN: 6.2500 mg | INTRAMUSCULAR | Status: DC | PRN

## 2018-07-11 SURGICAL SUPPLY — 73 items
BAG DECANTER FOR FLEXI CONT (MISCELLANEOUS) ×3 IMPLANT
BASKET BONE COLLECTION (BASKET) ×3 IMPLANT
BENZOIN TINCTURE PRP APPL 2/3 (GAUZE/BANDAGES/DRESSINGS) ×3 IMPLANT
BLADE CLIPPER SURG (BLADE) IMPLANT
BUR MATCHSTICK NEURO 3.0 LAGG (BURR) ×3 IMPLANT
CANISTER SUCT 3000ML PPV (MISCELLANEOUS) ×3 IMPLANT
CARTRIDGE OIL MAESTRO DRILL (MISCELLANEOUS) ×1 IMPLANT
CLOSURE WOUND 1/2 X4 (GAUZE/BANDAGES/DRESSINGS) ×1
CONT SPEC 4OZ CLIKSEAL STRL BL (MISCELLANEOUS) ×3 IMPLANT
COVER BACK TABLE 60X90IN (DRAPES) ×3 IMPLANT
COVER WAND RF STERILE (DRAPES) IMPLANT
DERMABOND ADVANCED (GAUZE/BANDAGES/DRESSINGS) ×2
DERMABOND ADVANCED .7 DNX12 (GAUZE/BANDAGES/DRESSINGS) ×1 IMPLANT
DIFFUSER DRILL AIR PNEUMATIC (MISCELLANEOUS) ×3 IMPLANT
DRAPE C-ARM 42X72 X-RAY (DRAPES) ×3 IMPLANT
DRAPE C-ARMOR (DRAPES) ×3 IMPLANT
DRAPE LAPAROTOMY 100X72X124 (DRAPES) ×3 IMPLANT
DRAPE POUCH INSTRU U-SHP 10X18 (DRAPES) IMPLANT
DRAPE SURG 17X23 STRL (DRAPES) ×3 IMPLANT
DRSG OPSITE POSTOP 4X10 (GAUZE/BANDAGES/DRESSINGS) ×3 IMPLANT
DURAPREP 26ML APPLICATOR (WOUND CARE) ×3 IMPLANT
ELECT REM PT RETURN 9FT ADLT (ELECTROSURGICAL) ×3
ELECTRODE REM PT RTRN 9FT ADLT (ELECTROSURGICAL) ×1 IMPLANT
EVACUATOR 1/8 PVC DRAIN (DRAIN) ×3 IMPLANT
FIBER BOAT ALLOFUSE 15CC (Bone Implant) ×3 IMPLANT
GAUZE 4X4 16PLY RFD (DISPOSABLE) IMPLANT
GLOVE BIO SURGEON STRL SZ 6.5 (GLOVE) ×10 IMPLANT
GLOVE BIO SURGEON STRL SZ7 (GLOVE) ×3 IMPLANT
GLOVE BIO SURGEON STRL SZ8 (GLOVE) ×6 IMPLANT
GLOVE BIO SURGEONS STRL SZ 6.5 (GLOVE) ×5
GLOVE BIOGEL PI IND STRL 6.5 (GLOVE) ×3 IMPLANT
GLOVE BIOGEL PI IND STRL 7.0 (GLOVE) ×1 IMPLANT
GLOVE BIOGEL PI IND STRL 7.5 (GLOVE) ×1 IMPLANT
GLOVE BIOGEL PI INDICATOR 6.5 (GLOVE) ×6
GLOVE BIOGEL PI INDICATOR 7.0 (GLOVE) ×2
GLOVE BIOGEL PI INDICATOR 7.5 (GLOVE) ×2
GOWN STRL REUS W/ TWL LRG LVL3 (GOWN DISPOSABLE) ×3 IMPLANT
GOWN STRL REUS W/ TWL XL LVL3 (GOWN DISPOSABLE) ×1 IMPLANT
GOWN STRL REUS W/TWL 2XL LVL3 (GOWN DISPOSABLE) IMPLANT
GOWN STRL REUS W/TWL LRG LVL3 (GOWN DISPOSABLE) ×6
GOWN STRL REUS W/TWL XL LVL3 (GOWN DISPOSABLE) ×2
HEMOSTAT POWDER KIT SURGIFOAM (HEMOSTASIS) ×3 IMPLANT
KIT BASIN OR (CUSTOM PROCEDURE TRAY) ×3 IMPLANT
KIT BONE MARROW PROCESS ANGEL (KITS) ×3 IMPLANT
KIT TURNOVER KIT B (KITS) ×3 IMPLANT
MILL MEDIUM DISP (BLADE) IMPLANT
NEEDLE HYPO 25X1 1.5 SAFETY (NEEDLE) ×3 IMPLANT
NS IRRIG 1000ML POUR BTL (IV SOLUTION) ×3 IMPLANT
OIL CARTRIDGE MAESTRO DRILL (MISCELLANEOUS) ×3
PACK LAMINECTOMY NEURO (CUSTOM PROCEDURE TRAY) ×3 IMPLANT
PAD ARMBOARD 7.5X6 YLW CONV (MISCELLANEOUS) ×12 IMPLANT
PUTTY DBM ALLOSYNC PURE 10CC (Putty) ×3 IMPLANT
ROD INVICTUS 300 (Rod) ×6 IMPLANT
SCREW ILIAC PA 5.5X40 (Screw) ×6 IMPLANT
SCREW KODIAK 5.5X45 (Screw) ×3 IMPLANT
SCREW KODIAK 6.5X40 (Screw) ×6 IMPLANT
SCREW PA 5X35 (Screw) ×6 IMPLANT
SCREW PA 5X40 (Screw) ×9 IMPLANT
SCREW PA 5X45 (Screw) ×6 IMPLANT
SET SCREW (Screw) ×24 IMPLANT
SET SCREW SPNE (Screw) ×12 IMPLANT
SPONGE LAP 4X18 RFD (DISPOSABLE) IMPLANT
SPONGE SURGIFOAM ABS GEL 100 (HEMOSTASIS) ×3 IMPLANT
STRIP CLOSURE SKIN 1/2X4 (GAUZE/BANDAGES/DRESSINGS) ×2 IMPLANT
SUT VIC AB 0 CT1 18XCR BRD8 (SUTURE) ×2 IMPLANT
SUT VIC AB 0 CT1 8-18 (SUTURE) ×4
SUT VIC AB 2-0 CP2 18 (SUTURE) ×6 IMPLANT
SUT VIC AB 3-0 SH 8-18 (SUTURE) ×6 IMPLANT
SYR CONTROL 10ML LL (SYRINGE) ×3 IMPLANT
TOWEL GREEN STERILE (TOWEL DISPOSABLE) ×3 IMPLANT
TOWEL GREEN STERILE FF (TOWEL DISPOSABLE) ×3 IMPLANT
TRAY FOLEY MTR SLVR 16FR STAT (SET/KITS/TRAYS/PACK) IMPLANT
WATER STERILE IRR 1000ML POUR (IV SOLUTION) ×3 IMPLANT

## 2018-07-11 NOTE — Progress Notes (Signed)
PROGRESS NOTE  Kaitlin Vargas TFT:[id number] DOB: [birth year] DOA: 07/10/2018 PCP: [name], MD  Brief History   Kaitlin Vargas is a 68 y.o. female with medical history significant of hypertension, hyperlipidemia, B12 deficiency, type 2 diabetes currently on insulin.  Patient had suffered from back pain for about 4 weeks when she was admitted earlier.  Patient presented to [facility] on 06/16/2018 with increasing back pain after recent treatment for bronchitis and COPD.  A CT scan of the spine showed osteomyelitis and she was transferred to [facility].  She had cardiac arrest during procedure requiring CPR for about 1 to 2 minutes.  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  Blood cultures showed MSSA.  She does have generalized weakness on both lower extremities and was followed by ID and neurosurgery.  Echo was negative for endocarditis.  On 06/26/2018 she was transferred to inpatient rehab to continue inpatient rehab with IV antibiotics and PICC line on right arm.  Patient does have pain issues and she is on long-acting morphine as well as oxycodone.  Since last 1 day, patient is started complaining of increasing back pain.  MRI of the lumbar spine with and without contrast showed worsening L1-2 discitis osteomyelitis with last epidural abscess.  Also showed prevertebral abscess.  ID and neurosurgery consulted.  After much discussion, neurosurgery decided to explore the abscess as it is not responding to antibiotics and requested transfer to medical floor. On my evaluation, patient complains that she is not able to walk on legs and that is for more than a month. Patient was having severe lower back pain, 10 out of 10, worse on mobility without radiation and no associated incontinence, nausea vomiting. Also noted blood sugars less than 40 in the evening on 07/08/2018 and 07/09/2018, Levemir dose was reduced from 25 to 20 units in the morning.  A & P    Assessment/Plan Principal Problem:   Lumbar discitis Active Problems:   Essential hypertension   Type 2 diabetes mellitus with hyperlipidemia (HCC)   Diabetic foot ulcer (HCC)   Sinus tachycardia   Hyponatremia   Labile blood glucose   Acute on chronic anemia   Bacteremia   Epidural abscess      Lumbar discitis, vertebral osteomyelitis with worsening abscess formation causing worsening pain and central canal stenosis: Pt transferred to hospitalist service on 07/10/2018. Continue Ancef as planned by infectious disease. Further surgical management as per surgery, they are planning for posterior instrumentation and decompression this afternoon. Pt is NPO for the procedure. Continue neurochecks and monitoring.  Type 2 diabetes with hyperglycemic episodes: Currently stabilized.  Her long-acting insulin doses have been reduced.  We will continue close monitoring. FSBS has been 162 - 199 in the last 24 hours.  Severe back pain: Recurrent acute on chronic back pain.  Continue adequate long-acting as well as short-acting narcotics.  She will need more rehab after the procedure.  Diabetic foot ulcer: Healed.  Anemia of chronic disease: Currently hemoglobin is stable.  DVT prophylaxis: SCDs.  Code Status: Full code. Family Communication: No family at bedside. Disposition Plan: Inpatient rehab. Consults called: Neurosurgery, infectious disease Admission status: Inpatient.  Kaitlin Remlinger, DO [facility] Direct contact: see [email or web address]  7PM-7AM contact night coverage as above 07/11/2018, 3:11 PM  LOS: 1 day   Consultants  . ID, Neurosurgery  Procedures  . Exploration and instrumentation of abscess.   Antibiotics  . IV Ancef as per ID.  Interval History/Subjective  The patient is resting comfortably. No new complaints.  Objective   Vitals:  Vitals:   07/11/18 0734 07/11/18 1132  BP: (!) 171/83 (!) 142/74  Pulse: 95 (!) 104  Resp:    Temp: 98.6 F (37 C) 98.6  F (37 C)  SpO2: 100% 94%    Exam:  Constitutional:  The patient is awake, alert, and oriented x 3. No acute distress. Respiratory:  . No wheezes, rales, or rhonchi. No tactile fremitus. . No increased work of breathing. Cardiovascular:  . Regular rate and rhythm. No murmurs, ectopy, or gallups. . No LE extremity edema   . Normal pedal pulses Abdomen:  . Abdomen soft, non-tender, nondistended . No hernias, masses, or organomegaly.  . Normoactive bowel sounds.  Musculoskeletal:  . No cyanosis, clubbing, or edema Skin:  . No rashes, lesions, ulcers . palpation of skin: no induration or nodules Neurologic:  . CN 2-12 intact . Sensation all 4 extremities intact Psychiatric:  . Mental status o Mood, affect appropriate o Orientation to person, place, time  . judgment and insight appear intact     I have personally reviewed the following:   Today's Data  . CBC, chemistry, cultures, imagery, or vitals.   Scheduled Meds: . [MAR Hold] aspirin  81 mg Oral Daily  . [MAR Hold] bethanechol  10 mg Oral TID  . Chlorhexidine Gluconate Cloth  6 each Topical Once   And  . Chlorhexidine Gluconate Cloth  6 each Topical Once  . [MAR Hold] feeding supplement (GLUCERNA SHAKE)  237 mL Oral TID WC  . [MAR Hold] feeding supplement (PRO-STAT SUGAR FREE 64)  30 mL Oral BID  . [MAR Hold] furosemide  40 mg Oral Daily  . [MAR Hold] insulin aspart  0-9 Units Subcutaneous TID WC  . [MAR Hold] insulin glargine  20 Units Subcutaneous Daily  . [MAR Hold] ketoconazole   Topical BID  . [MAR Hold] lisinopril  10 mg Oral Daily  . [MAR Hold] methocarbamol  750 mg Oral Q6H  . [MAR Hold] metoprolol tartrate  25 mg Oral BID  . [MAR Hold] morphine  30 mg Oral Q12H  . [MAR Hold] multivitamin with minerals  1 tablet Oral Daily  . [MAR Hold] pantoprazole  40 mg Oral Daily  . [MAR Hold] rifampin  300 mg Oral Q12H  . scopolamine  1 patch Transdermal Q72H  . scopolamine      . [MAR Hold] senna-docusate  2  tablet Oral QHS  . [MAR Hold] sodium chloride flush  10-40 mL Intracatheter Q12H  . [MAR Hold] vitamin B-12  1,000 mcg Oral Daily   Continuous Infusions: . ceFAZolin    . [MAR Hold]  ceFAZolin (ANCEF) IV 2 g (07/11/18 0034)  .  ceFAZolin (ANCEF) IV    . lactated ringers      Principal Problem:   MSSA bacteremia Active Problems:   Essential hypertension   Type 2 diabetes mellitus with hyperlipidemia (HCC)   Diabetic foot ulcer (HCC)   Epidural abscess   Abscess of spinal cord due to bacteria   LOS: 1 day

## 2018-07-11 NOTE — Progress Notes (Signed)
Beta blocker received last night. Tabatha, CRNA made aware, will give in OR.

## 2018-07-11 NOTE — Progress Notes (Signed)
OT Cancellation Text messaged Dr. Ronnald Ramp regarding activity orders.   07/11/18 0900  OT Visit Information  Last OT Received On 07/11/18  Reason Eval/Treat Not Completed Active bedrest order  Maurie Boettcher, OT/L   Acute OT Clinical Specialist [place] Pager [phone] Office [phone]

## 2018-07-11 NOTE — Progress Notes (Signed)
Subjective:   No new complaints  Antibiotics:  Anti-infectives (From admission, onward)   Start     Dose/Rate Route Frequency Ordered Stop   07/10/18 1415  ceFAZolin (ANCEF) IVPB 2g/100 mL premix     2 g 200 mL/hr over 30 Minutes Intravenous Every 8 hours 07/10/18 1412        Medications: Scheduled Meds: . aspirin  81 mg Oral Daily  . bethanechol  10 mg Oral TID  . feeding supplement (GLUCERNA SHAKE)  237 mL Oral TID WC  . feeding supplement (PRO-STAT SUGAR FREE 64)  30 mL Oral BID  . furosemide  40 mg Oral Daily  . insulin aspart  0-9 Units Subcutaneous TID WC  . insulin glargine  20 Units Subcutaneous Daily  . ketoconazole   Topical BID  . lisinopril  10 mg Oral Daily  . methocarbamol  750 mg Oral Q6H  . metoprolol tartrate  25 mg Oral BID  . morphine  30 mg Oral Q12H  . multivitamin with minerals  1 tablet Oral Daily  . pantoprazole  40 mg Oral Daily  . senna-docusate  2 tablet Oral QHS  . sodium chloride flush  10-40 mL Intracatheter Q12H  . vitamin B-12  1,000 mcg Oral Daily   Continuous Infusions: .  ceFAZolin (ANCEF) IV 2 g (07/11/18 0607)   PRN Meds:.albuterol, bisacodyl, HYDROcodone-acetaminophen, sodium chloride flush, sodium phosphate, sorbitol    Objective: Weight change:   Intake/Output Summary (Last 24 hours) at 07/11/2018 1011 Last data filed at 07/11/2018 0300 Gross per 24 hour  Intake 540 ml  Output 1550 ml  Net -1010 ml   Blood pressure (!) 171/83, pulse 95, temperature 98.6 F (37 C), temperature source Oral, resp. rate 18, SpO2 100 %. Temp:  [98.6 F (37 C)-99.3 F (37.4 C)] 98.6 F (37 C) (02/21 0734) Pulse Rate:  [92-103] 95 (02/21 0734) Resp:  [16-18] 18 (02/20 1943) BP: (146-171)/(66-84) 171/83 (02/21 0734) SpO2:  [96 %-100 %] 100 % (02/21 0734)  Physical Exam: General: Alert and awake, oriented x3, not in any acute distress. HEENT: anicteric sclera, EOMI CVS regular rate, normal no murmurs gallops or rubs  heard Chest: , no wheezing, no respiratory distress clear to auscultation bilaterally Abdomen: soft non-distended,   Neuro: She has a stronger grip and can flex and extend better on the right versus the left but she also is right-handed.  She has no other neurological deficits.    Skin toe without evidence of deep infection 07/11/2018:        CBC:    BMET No results for input(s): NA, K, CL, CO2, GLUCOSE, BUN, CREATININE, CALCIUM in the last 72 hours.   Liver Panel  No results for input(s): PROT, ALBUMIN, AST, ALT, ALKPHOS, BILITOT, BILIDIR, IBILI in the last 72 hours.     Sedimentation Rate No results for input(s): ESRSEDRATE in the last 72 hours. C-Reactive Protein No results for input(s): CRP in the last 72 hours.  Micro Results: Recent Results (from the past 720 hour(s))  Urine culture     Status: Abnormal   Collection Time: 06/16/18 12:57 PM  Result Value Ref Range Status   Specimen Description   Final    URINE, CLEAN CATCH Performed at [facility], [address] [place], [place] [address]    Special Requests   Final    NONE Performed at [facility], [address] [place], [place] [address]    Culture (A)  Final    >=  100,000 COLONIES/mL STAPHYLOCOCCUS AUREUS >=100,000 COLONIES/mL GRANULICATELLA ADIACENS Standardized susceptibility testing for this organism is not available. Performed at [facility], [place] [address], [place], [place] [address]    Report Status 06/20/2018 FINAL  Final   Organism ID, Bacteria STAPHYLOCOCCUS AUREUS (A)  Final      Susceptibility   Staphylococcus aureus - MIC*    CIPROFLOXACIN <=0.5 SENSITIVE Sensitive     GENTAMICIN <=0.5 SENSITIVE Sensitive     NITROFURANTOIN <=16 SENSITIVE Sensitive     OXACILLIN <=0.25 SENSITIVE Sensitive     TETRACYCLINE <=1 SENSITIVE Sensitive     VANCOMYCIN <=0.5 SENSITIVE Sensitive     TRIMETH/SULFA <=10 SENSITIVE Sensitive     CLINDAMYCIN <=0.25 SENSITIVE Sensitive     RIFAMPIN <=0.5  SENSITIVE Sensitive     Inducible Clindamycin NEGATIVE Sensitive     * >=100,000 COLONIES/mL STAPHYLOCOCCUS AUREUS  Culture, blood (routine x 2)     Status: Abnormal   Collection Time: 06/16/18  1:53 PM  Result Value Ref Range Status   Specimen Description   Final    LEFT ANTECUBITAL Performed at [facility], [address] [place], [place] [address]    Special Requests   Final    BOTTLES DRAWN AEROBIC ONLY Blood Culture results may not be optimal due to an inadequate volume of blood received in culture bottles Performed at [facility], [address] [place], [place] [address]    Culture  Setup Time   Final    AEROBIC BOTTLE ONLY GRAM POSITIVE COCCI Gram Stain Report Called to,Read Back By and Verified With: J HEARN,RN @0338  06/17/18 [facility] Performed at [facility], [address], [place], [place] [address]    Culture (A)  Final    STAPHYLOCOCCUS AUREUS SUSCEPTIBILITIES PERFORMED ON PREVIOUS CULTURE WITHIN THE LAST 5 DAYS. Performed at [facility], [place] [address] [place], [place] [address]    Report Status 06/19/2018 FINAL  Final  Culture, blood (routine x 2)     Status: Abnormal   Collection Time: 06/16/18  2:51 PM  Result Value Ref Range Status   Specimen Description   Final    BLOOD BLOOD RIGHT WRIST Performed at [facility], [address], [place], [place] [address]    Special Requests   Final    BOTTLES DRAWN AEROBIC AND ANAEROBIC Blood Culture results may not be optimal due to an inadequate volume of blood received in culture bottles Performed at [place] [place], [place] [address]    Culture  Setup Time   Final    IN BOTH AEROBIC AND ANAEROBIC BOTTLES GRAM POSITIVE COCCI Gram Stain Report Called to,Read Back By and Verified With: J HEARN,RN @0338  06/17/18 [facility] CRITICAL RESULT CALLED TO, READ BACK BY AND VERIFIED WITH: PHARMD L POOLE [id number] [phone] MLM Performed at [facility], [place] [address] [place], [place] [address]     Culture STAPHYLOCOCCUS AUREUS (A)  Final   Report Status 06/19/2018 FINAL  Final   Organism ID, Bacteria STAPHYLOCOCCUS AUREUS  Final      Susceptibility   Staphylococcus aureus - MIC*    CIPROFLOXACIN <=0.5 SENSITIVE Sensitive     ERYTHROMYCIN >=8 RESISTANT Resistant     GENTAMICIN <=0.5 SENSITIVE Sensitive     OXACILLIN <=0.25 SENSITIVE Sensitive     TETRACYCLINE <=1 SENSITIVE Sensitive     VANCOMYCIN <=0.5 SENSITIVE Sensitive     TRIMETH/SULFA <=10 SENSITIVE Sensitive     CLINDAMYCIN <=0.25 SENSITIVE Sensitive  RIFAMPIN <=0.5 SENSITIVE Sensitive     Inducible Clindamycin NEGATIVE Sensitive     * STAPHYLOCOCCUS AUREUS  Blood Culture ID Panel (Reflexed)     Status: Abnormal   Collection Time: 06/16/18  2:51 PM  Result Value Ref Range Status   Enterococcus species NOT DETECTED NOT DETECTED Final   Listeria monocytogenes NOT DETECTED NOT DETECTED Final   Staphylococcus species DETECTED (A) NOT DETECTED Final    Comment: CRITICAL RESULT CALLED TO, READ BACK BY AND VERIFIED WITH: PHARMD L POOLE [id number] [phone] MLM    Staphylococcus aureus (BCID) DETECTED (A) NOT DETECTED Final    Comment: Methicillin (oxacillin) susceptible Staphylococcus aureus (MSSA). Preferred therapy is anti staphylococcal beta lactam antibiotic (Cefazolin or Nafcillin), unless clinically contraindicated. CRITICAL RESULT CALLED TO, READ BACK BY AND VERIFIED WITH: PHARMD L POOLE [id number] 0800 MLM    Methicillin resistance NOT DETECTED NOT DETECTED Final   Streptococcus species NOT DETECTED NOT DETECTED Final   Streptococcus agalactiae NOT DETECTED NOT DETECTED Final   Streptococcus pneumoniae NOT DETECTED NOT DETECTED Final   Streptococcus pyogenes NOT DETECTED NOT DETECTED Final   Acinetobacter baumannii NOT DETECTED NOT DETECTED Final   Enterobacteriaceae species NOT DETECTED NOT DETECTED Final   Enterobacter cloacae complex NOT DETECTED NOT DETECTED Final   Escherichia coli NOT DETECTED NOT DETECTED Final    Klebsiella oxytoca NOT DETECTED NOT DETECTED Final   Klebsiella pneumoniae NOT DETECTED NOT DETECTED Final   Proteus species NOT DETECTED NOT DETECTED Final   Serratia marcescens NOT DETECTED NOT DETECTED Final   Haemophilus influenzae NOT DETECTED NOT DETECTED Final   Neisseria meningitidis NOT DETECTED NOT DETECTED Final   Pseudomonas aeruginosa NOT DETECTED NOT DETECTED Final   Candida albicans NOT DETECTED NOT DETECTED Final   Candida glabrata NOT DETECTED NOT DETECTED Final   Candida krusei NOT DETECTED NOT DETECTED Final   Candida parapsilosis NOT DETECTED NOT DETECTED Final   Candida tropicalis NOT DETECTED NOT DETECTED Final    Comment: Performed at [facility], [place]. [address], [place], [place] [address]  MRSA PCR Screening     Status: None   Collection Time: 06/17/18 12:13 PM  Result Value Ref Range Status   MRSA by PCR NEGATIVE NEGATIVE Final    Comment:        The GeneXpert MRSA Assay (FDA approved for NASAL specimens only), is one component of a comprehensive MRSA colonization surveillance program. It is not intended to diagnose MRSA infection nor to guide or monitor treatment for MRSA infections. Performed at [facility], [place] [address] [place], [place] [address]   Aerobic/Anaerobic Culture (surgical/deep wound)     Status: None   Collection Time: 06/18/18 12:02 PM  Result Value Ref Range Status   Specimen Description VERTEBRA DISC  Final   Special Requests NONE  Final   Gram Stain   Final    ABUNDANT WBC PRESENT, PREDOMINANTLY PMN ABUNDANT GRAM POSITIVE COCCI IN PAIRS IN CLUSTERS    Culture   Final    MODERATE STAPHYLOCOCCUS AUREUS NO ANAEROBES ISOLATED Performed at [facility], [address], [place], [place] [address]    Report Status 06/23/2018 FINAL  Final   Organism ID, Bacteria STAPHYLOCOCCUS AUREUS  Final      Susceptibility   Staphylococcus aureus - MIC*    CIPROFLOXACIN <=0.5 SENSITIVE Sensitive     ERYTHROMYCIN >=8  RESISTANT Resistant     GENTAMICIN <=0.5 SENSITIVE Sensitive     OXACILLIN <=0.25 SENSITIVE Sensitive  TETRACYCLINE <=1 SENSITIVE Sensitive     VANCOMYCIN <=0.5 SENSITIVE Sensitive     TRIMETH/SULFA <=10 SENSITIVE Sensitive     CLINDAMYCIN <=0.25 SENSITIVE Sensitive     RIFAMPIN <=0.5 SENSITIVE Sensitive     Inducible Clindamycin NEGATIVE Sensitive     * MODERATE STAPHYLOCOCCUS AUREUS  Culture, blood (routine x 2)     Status: None   Collection Time: 06/18/18  4:15 PM  Result Value Ref Range Status   Specimen Description BLOOD LEFT ARM  Final   Special Requests   Final    BOTTLES DRAWN AEROBIC ONLY Blood Culture adequate volume   Culture   Final    NO GROWTH 5 DAYS Performed at [facility], [address] [place], [place] [address]    Report Status 06/23/2018 FINAL  Final  Culture, blood (routine x 2)     Status: None   Collection Time: 06/18/18  4:49 PM  Result Value Ref Range Status   Specimen Description BLOOD LEFT ANTECUBITAL  Final   Special Requests   Final    BOTTLES DRAWN AEROBIC AND ANAEROBIC Blood Culture adequate volume   Culture   Final    NO GROWTH 5 DAYS Performed at [facility], [place] [address] [place], [place] [address]    Report Status 06/23/2018 FINAL  Final  Aerobic Culture (superficial specimen)     Status: None   Collection Time: 06/19/18  1:04 PM  Result Value Ref Range Status   Specimen Description WOUND  Final   Special Requests PERINEAL  Final   Gram Stain   Final    NO WBC SEEN FEW YEAST Performed at [facility], [place] [address], [place], [place] [address]    Culture MODERATE CANDIDA ALBICANS  Final   Report Status 06/23/2018 FINAL  Final  Urine Culture     Status: Abnormal   Collection Time: 07/06/18  8:37 AM  Result Value Ref Range Status   Specimen Description URINE, RANDOM  Final   Special Requests NONE  Final   Culture (A)  Final    <10,000 COLONIES/mL INSIGNIFICANT GROWTH Performed at [facility],  [place] [address] [place], [place] [address]    Report Status 07/07/2018 FINAL  Final    Studies/Results: Ct Lumbar Spine Wo Contrast  Result Date: 07/09/2018 CLINICAL DATA:  Low back pain. Assess stenosis. History of discitis osteomyelitis. EXAM: CT LUMBAR SPINE WITHOUT CONTRAST TECHNIQUE: Multidetector CT imaging of the lumbar spine was performed without intravenous contrast administration. Multiplanar CT image reconstructions were also generated. COMPARISON:  MRI of the lumbar spine July 09, 2018 and CT abdomen and pelvis June 16, 2018. FINDINGS: SEGMENTATION: For the purposes of this report the last well-formed intervertebral disc space is reported as L5-S1. ALIGNMENT: Maintained lumbar lordosis. Minimal grade 1 L4-5 anterolisthesis. VERTEBRAE: Severe L1-2 disc height loss with subsidence, destructive bony changes L1 inferior and L2 superior endplates with mild L2 height loss, progressed from prior examination. Remaining lumbar vertebral bodies intact. Remaining disc heights preserved. PARASPINAL AND OTHER SOFT TISSUES: Abnormal soft tissue contiguous with RIGHT crus of the diaphragm with punctate calcifications. Abnormal prevertebral soft tissue swelling with punctate calcifications. Through L2-3 with bilateral iliopsoas fat stranding. Mild calcific atherosclerosis aortoiliac vessels. DISC LEVELS: Soft tissue and punctate bony fragments at L1-2 result in severe canal stenosis with narrowed neural foramen which may affect the traversing L2 nerves. Neural foraminal stenosis better demonstrated on today's MRI. Progressive widening LEFT L4-5 facet at 3 mm. IMPRESSION:  1. 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 Severe L1-2 canal stenosis. 2. Prevertebral phlegmon/abscess better demonstrated on today's MRI; given presence of punctate calcifications, recommend exclusion of tuberculosis. 3. Increased widening of LEFT L4-5 facet may be infectious or inflammatory. Grade 1 L4-5  anterolisthesis without spondylolysis. 4. These results will be called to the ordering clinician or representative by the professional radiologist assistant, and communication documented in [employer] Dashboard. Electronically Signed   By: Elon Alas M.D.   On: 07/09/2018 19:23   Mr Jeri Cos QB Contrast  Result Date: 07/10/2018 CLINICAL DATA:  Encephalopathy.  Rule out infection EXAM: MRI HEAD WITHOUT AND WITH CONTRAST TECHNIQUE: Multiplanar, multiecho pulse sequences of the brain and surrounding structures were obtained without and with intravenous contrast. CONTRAST:  7.5 mL Gadovist IV COMPARISON:  None. FINDINGS: Brain: Generalized atrophy without hydrocephalus. Mild chronic changes in the white matter. No acute infarct. Negative for hemorrhage or mass. No edema. Normal enhancement postcontrast infusion. No enhancing mass or abscess. Leptomeningeal enhancement normal. Vascular: Normal vascular flow voids. Skull and upper cervical spine: Negative Sinuses/Orbits: Mild mucosal edema paranasal sinuses. Bilateral mastoid effusion. Bilateral cataract surgery Other: None IMPRESSION: No acute intracranial abnormality Mild atrophy and mild chronic microvascular ischemic change in the white matter. Electronically Signed   By: Franchot Gallo M.D.   On: 07/10/2018 12:31   Mr Lumbar Spine W Wo Contrast  Result Date: 07/09/2018 CLINICAL DATA:  Back pain.  Follow-up discitis osteomyelitis. EXAM: MRI LUMBAR SPINE WITHOUT AND WITH CONTRAST TECHNIQUE: Multiplanar and multiecho pulse sequences of the lumbar spine were obtained without and with intravenous contrast. CONTRAST:  7.7 cc Gadavist COMPARISON:  MRI of the lumbar spine June 16, 2018. FINDINGS: SEGMENTATION: For the purposes of this report, the last well-formed intervertebral disc is reported as L5-S1. ALIGNMENT: Maintained lumbar lordosis. Grade 1 L4-5 anterolisthesis without spondylolysis. VERTEBRAE: Slight interval expansion of L1-2 disc height loss with  worsening rim enhancing fluid extending into the prevertebral and epidural space. Diffusely low T1 and bright T2 signal within the L1 and L2 vertebral bodies. Remaining lumbar vertebral bodies and disc spaces intact. Scattered focal fat versus hemangiomas. CONUS MEDULLARIS AND CAUDA EQUINA: Conus medullaris deformity and faint suspected cord edema at L1-2. No nerve root clumping or leptomeningeal enhancement. PARASPINAL AND OTHER SOFT TISSUES:Moderately motion degraded axial sequences. Epidural abscess from T12-L2 0.6 x 0.7 x 6.3 cm (transverse by AP by cc) ventral epidural abscess. Dorsal epidural enhancement consistent with infection without focal fluid. Rim enhancing abscess contiguous with RIGHT crus of the diaphragm measuring to 2.1 cm with prevertebral phlegmon from T11-12 through L2-3. Bilateral iliopsoas muscle myositis without focal fluid collection though axial sequences motion degraded. Subcentimeter layering gallstones. DISC LEVELS: T11-12: Ventral epidural abscess measuring to 3 mm without canal stenosis or neural foraminal narrowing. T12-L1: Ventral epidural abscess measuring to 4 mm. Mild facet arthropathy and ligamentum flavum redundancy. Mild canal stenosis is slightly narrowed LEFT lateral recess which may affect the traversing LEFT L1 nerve. No neural foraminal narrowing. L1-2: Ventral epidural abscess. Enhancement within RIGHT facet. Moderate canal stenosis with narrowed lateral recesses which may affect the traversing L2 nerve. Soft tissue resulting in moderate to severe neural foraminal narrowing. L2-3: No disc bulge, canal stenosis nor neural foraminal narrowing. Mild facet arthropathy. L3-4: Annular bulging asymmetric to the RIGHT, mild facet arthropathy. No canal stenosis. Mild RIGHT neural foraminal narrowing. L4-5: Anterolisthesis. Annular bulging, moderate to severe facet arthropathy and ligamentum flavum redundancy with bilateral facet effusions, enhancing on the LEFT. No  canal stenosis.  Moderate RIGHT neural foraminal narrowing. L5-S1: No disc bulge, canal stenosis nor neural foraminal narrowing. Mild facet arthropathy. IMPRESSION: 1. 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 Prevertebral abscess contiguous with RIGHT crus of the diaphragm measuring to 2.1 cm. Iliopsoas myositis. 2. Worsening moderate canal stenosis L1-2 due to epidural collection. Moderate to severe L1-2 neural foraminal effacement. 3. Worsening LEFT L4-5 facet enhancement may be reactive or infectious. Electronically Signed   By: Elon Alas M.D.   On: 07/09/2018 15:05      Assessment/Plan:  INTERVAL HISTORY:   MRI brain without intracranial infection.  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 female admitted with 4 week history of worsening/severe back pain and weakness. Found to have L1-L2 discitis/osteomyelitis and paravertebral abscess. Her blood cultures  revealed MSSA. Disc aspiration ALSO REVEALED MSSA.  She had an ulcer on her foot which was thought to be the source of her infection though imaging there was no evidence of osteomyelitis.  She underwent transthoracic echocardiogram but not transesophageal echocardiogram at the time due to concerns about risk of anesthesia.  She is been getting IV antibiotics on the rehab floor since then.  In the interim she developed abrupt worsening of her lower back pain and was reimaged with MRI which showed worsening faction at the L1-L2 disc large epidural abscess, a prevertebral abscess along the diaphragm with worsening canal stenosis at L1 and L2 due to encroachment by the epidural infection.  There is also moderate to severe L1-L2 neural foraminal and effacement and also L4-L5 facet  enhancement.  We were consulted along with neurosurgery due to the fact that he had worsening of her infection despite being on a highly targeted specific potent appropriately dosed antistaphylococcal cephalosporin.  Neurosurgery initially did do not want to operate on her due to the complexity of her anatomy and the desire not to place hardware in an infected space.  Dr. Ronnald Ramp now is going to perform an L1-L2 decompressive laminectomy with fusion of T10-L3.  Optical that he can do much in the OR to decompress the epidural abscess though he mentions trying to reach up onto the dura with a probe to release some of this material.  DEEP INTRA-OP cultures will be helpful  She will need to have rifampin 300mg  BID added given placement of hardware.  Dr. Johnnye Sima will check in on her postoperatively this weekend and Dr. Baxter Flattery take over the service on Monday.      LOS: 1 day   Alcide Evener 07/11/2018, 10:11 AM

## 2018-07-11 NOTE — Progress Notes (Signed)
PT Cancellation Note  Patient Details Name: Kaitlin Vargas MRN: [id number] DOB: 07/14/1950   Cancelled Treatment:    Reason Eval/Treat Not Completed: Medical issues which prohibited therapy Patient scheduled for surgery today. Will follow-up when medically appropriate.    Lanney Gins, PT, DPT Supplemental Physical Therapist 07/11/18 10:31 AM Pager: [phone] Office: [phone]

## 2018-07-11 NOTE — Anesthesia Procedure Notes (Signed)
Procedure Name: Intubation Date/Time: 07/11/2018 2:58 PM Performed by: Cleda Daub, CRNA Pre-anesthesia Checklist: Patient identified, Emergency Drugs available, Suction available and Patient being monitored Patient Re-evaluated:Patient Re-evaluated prior to induction Oxygen Delivery Method: Circle system utilized Preoxygenation: Pre-oxygenation with 100% oxygen Induction Type: IV induction Ventilation: Mask ventilation without difficulty and Mask ventilation throughout procedure Laryngoscope Size: Mac and 3 Grade View: Grade I Tube type: Oral Tube size: 6.5 mm Number of attempts: 1 Airway Equipment and Method: Stylet Placement Confirmation: ETT inserted through vocal cords under direct vision,  positive ETCO2 and breath sounds checked- equal and bilateral Secured at: 20 cm Tube secured with: Tape Dental Injury: Teeth and Oropharynx as per pre-operative assessment

## 2018-07-11 NOTE — Op Note (Signed)
07/11/2018  6:02 PM  PATIENT:  Kaitlin Vargas  68 y.o. 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 Decompressive lumbar laminectomy lumbar 1-2 left with hemi-facetectomy and sublaminar decompression and discectomy.  Disc was sent for biopsy and culture 2. Posterior fixation T10-L3 inclusive using Alphatec cortical pedicle screws.  3. Intertransverse arthrodesis T10-L3 using morcellized autograft and allograft soaked with a bone marrow aspirate obtained through a separate fascial incision over the left iliac crest.  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 Imaging revealed epidural abscess with spontaneous discitis and destruction of the disc space consistent with stability and kyphosis. The patient tried a reasonable attempt at conservative medical measures without relief. I recommended decompression and instrumented fusion to address the stenosis as well as the segmental  instability.  Patient understood the risks, benefits, and alternatives and potential outcomes and wished to proceed.  PROCEDURE DETAILS:  The patient was brought to the operating room. After induction of generalized endotracheal anesthesia the patient was rolled into the prone position on chest rolls and all pressure points were padded. The patient's lumbar region was cleaned and then prepped with DuraPrep and draped in the usual sterile fashion. Anesthesia was injected and then a dorsal midline incision was made and carried down to the lumbosacral fascia. The fascia was opened and the paraspinous musculature was taken  down in a subperiosteal fashion to expose T10-L3. A self-retaining retractor was placed. Intraoperative fluoroscopy confirmed my level, and I started with placement of the L1-L3 cortical pedicle screws. The pedicle screw entry zones were identified utilizing surface landmarks and  AP and lateral fluoroscopy. I scored the cortex with the high-speed drill and then used the hand drill to drill an upward and outward direction into the pedicle. I then tapped line to line. I then placed a 5.0 x 40 mm cortical pedicle screw into the pedicles of L1 and L2 bilaterally 6.5 x 40 mm pedicle screws at L3.  I then dissected in a suprafascial plane to expose the iliac crest.  Open the fascia used a Jamshidi needle to extract 60 cc of bone marrow aspirate from the iliac crest.  This was then spun down by [facility] device and 2 to 4 cc of  BMAC was soaked on morselized allograft for later arthrodesis.  I dried the hole with Surgifoam and closed the fascia.  I then turned my attention to the decompression and complete lumbar laminectomies, hemi- facetectomies, and foraminotomies were performed at L1-2 on the left.  The disc space was entered and there was a release of some fluid.  I did not see exudate.  Did remove disc and sent it for pathology and culture.  Also removed some epidural phlegmon and palpated with a ball probe underneath the ventral dura and found no release of liquid exudate.  We then turned our attention to the placement of the thoracic pedicle screws. The pedicle screw entry zones were identified utilizing surface landmarks and AP and lateral fluoroscopy. I probed each pedicle with the pedicle probe and palpated with a ball probe and then tapped each pedicle with a 4.5 tap.  We palpated with a ball  probe to assure no break in the cortex. We then placed 5.0 x 40 mm pedicle screws into the pedicles bilaterally at T10 and 5.5 x 40 mm screws at T11 and T12. We then decorticated the transverse processes and laid a mixture of  morcellized autograft and allograft out over these to perform intertransverse arthrodesis at T10-L3. We then placed  rods into the multiaxial screw heads of the pedicle screws and locked these in position with the locking caps and anti-torque device. We then checked our construct with AP and lateral fluoroscopy. Irrigated with copious amounts of bacitracin-containing saline solution. Inspected the nerve roots once again to assure adequate decompression, lined to the dura with Gelfoam, placed powdered vancomycin into the wound, and closed the muscle and the fascia with 0 Vicryl. Closed the subcutaneous tissues with 2-0 Vicryl and subcuticular tissues with 3-0 Vicryl. The skin was closed with benzoin and Steri-Strips. Dressing was then applied, the patient was awakened from general anesthesia and transported to the recovery room in stable condition. At the end of the procedure all sponge, needle and instrument counts were correct.   PLAN OF CARE: admit to inpatient  PATIENT DISPOSITION:  PACU - hemodynamically stable.   Delay start of Pharmacological VTE agent (>24hrs) due to surgical blood loss or risk of bleeding:  yes

## 2018-07-11 NOTE — Progress Notes (Signed)
Report given to Gabe at 11am.

## 2018-07-11 NOTE — Progress Notes (Signed)
OT Cancellation Note  Patient Details Name: Kaitlin Vargas MRN: [id number] DOB: [birth year]   Cancelled Treatment:    Reason Eval/Treat Not Completed: Other (comment). Pt scheduled for surgery today. Will assess when medically appropriate.   Ramond Dial, OT/L   Acute OT Clinical Specialist Acute Rehabilitation Services Pager [phone] Office [phone]  07/11/2018, 10:08 AM

## 2018-07-11 NOTE — Anesthesia Preprocedure Evaluation (Addendum)
Anesthesia Evaluation  Patient identified by MRN, date of birth, ID band Patient awake    Reviewed: Allergy & Precautions, NPO status , Patient's Chart, lab work & pertinent test results  Airway Mallampati: II  TM Distance: >3 FB Neck ROM: Full    Dental  (+) Upper Dentures, Edentulous Lower   Pulmonary neg pulmonary ROS,     + decreased breath sounds      Cardiovascular hypertension, Pt. on home beta blockers  Rhythm:Regular Rate:Normal     Neuro/Psych negative psych ROS   GI/Hepatic Neg liver ROS, GERD  Medicated,  Endo/Other  diabetes, Type 2, Insulin Dependent  Renal/GU negative Renal ROS     Musculoskeletal  (+) Arthritis ,   Abdominal (+) + obese,   Peds  Hematology   Anesthesia Other Findings   Reproductive/Obstetrics                            Lab Results  Component Value Date   WBC 4.6 07/08/2018   HGB 10.0 (L) 07/08/2018   HCT 32.5 (L) 07/08/2018   MCV 102.2 (H) 07/08/2018   PLT 269 07/08/2018   Lab Results  Component Value Date   CREATININE 0.60 07/08/2018   BUN 15 07/08/2018   NA 135 07/08/2018   K 3.5 07/08/2018   CL 97 (L) 07/08/2018   CO2 29 07/08/2018   Lab Results  Component Value Date   INR 1.04 06/17/2018   Echo:  1. The left ventricle appears to be normal in size, has normal wall thickness >65% ejection fraction Spectral Doppler shows normal pattern of diastolic filling.  2. Normal left atrial size.  3. Normal right atrial size.  4. The mitral valve normal in structure.  5. Normal tricuspid valve.  6. Aortic valve normal.  7. No atrial level shunt detected by color flow Doppler.  8. Mild dilatation of the aortic root.  9. No vegetations detected.  Anesthesia Physical Anesthesia Plan  ASA: II  Anesthesia Plan: General   Post-op Pain Management:    Induction: Intravenous  PONV Risk Score and Plan: 4 or greater and Ondansetron, Dexamethasone,  Midazolam and Scopolamine patch - Pre-op  Airway Management Planned: Oral ETT  Additional Equipment: None  Intra-op Plan:   Post-operative Plan: Extubation in OR  Informed Consent: I have reviewed the patients History and Physical, chart, labs and discussed the procedure including the risks, benefits and alternatives for the proposed anesthesia with the patient or authorized representative who has indicated his/her understanding and acceptance.     Dental advisory given  Plan Discussed with: CRNA  Anesthesia Plan Comments:        Anesthesia Quick Evaluation

## 2018-07-11 NOTE — Transfer of Care (Signed)
Immediate Anesthesia Transfer of Care Note  Patient: [facility]  Procedure(s) Performed: Thoracic ten-Lumbar three Fusion with instrumentation-Lumbar one-two Decompressive Laminectomy  (N/A Back)  Patient Location: PACU  Anesthesia Type:General  Level of Consciousness: awake, alert , oriented and patient cooperative  Airway & Oxygen Therapy: Patient Spontanous Breathing and Patient connected to face mask oxygen  Post-op Assessment: Report given to RN and Post -op Vital signs reviewed and stable  Post vital signs: Reviewed and stable  Last Vitals:  Vitals Value Taken Time  BP 126/77 07/11/2018  6:08 PM  Temp 36.7 C 07/11/2018  6:08 PM  Pulse 101 07/11/2018  6:12 PM  Resp 21 07/11/2018  6:12 PM  SpO2 96 % 07/11/2018  6:12 PM  Vitals shown include unvalidated device data.  Last Pain:  Vitals:   07/11/18 1132  TempSrc: Oral  PainSc:       Patients Stated Pain Goal: 3 (41/03/01 3143)  Complications: No apparent anesthesia complications

## 2018-07-11 NOTE — Progress Notes (Signed)
PT Cancellation Note  Patient Details Name: Kaitlin Vargas MRN: [id number] DOB: 1950-06-21   Cancelled Treatment:    Reason Eval/Treat Not Completed: Active bedrest order Will need increased activity orders prior to PT eval/treat. Will follow.   Lanney Gins, PT, DPT Supplemental Physical Therapist 07/11/18 9:04 AM Pager: [phone] Office: [phone]

## 2018-07-11 NOTE — Progress Notes (Signed)
Report receive from Gabe at 1500, pt is in surgery.

## 2018-07-11 NOTE — Progress Notes (Signed)
[facility] Coord[facility] has been following pt on CIR for pharmacy services for IV ABX at home at DC.  Our team will continue to follow and support as needed for DC to home when ready.  [facility] is providing HH.  If patient discharges after hours, please call [phone].   Larry Sierras 07/11/2018, 6:10 AM

## 2018-07-12 DIAGNOSIS — Z981 Arthrodesis status: Secondary | ICD-10-CM

## 2018-07-12 DIAGNOSIS — Z95828 Presence of other vascular implants and grafts: Secondary | ICD-10-CM

## 2018-07-12 DIAGNOSIS — R7881 Bacteremia: Secondary | ICD-10-CM

## 2018-07-12 DIAGNOSIS — E119 Type 2 diabetes mellitus without complications: Secondary | ICD-10-CM

## 2018-07-12 LAB — BASIC METABOLIC PANEL
Anion gap: 9 (ref 5–15)
BUN: 17 mg/dL (ref 8–23)
CO2: 27 mmol/L (ref 22–32)
Calcium: 8.3 mg/dL — ABNORMAL LOW (ref 8.9–10.3)
Chloride: 94 mmol/L — ABNORMAL LOW (ref 98–111)
Creatinine, Ser: 0.65 mg/dL (ref 0.44–1.00)
GFR calc Af Amer: >60 mL/min (ref >60)
GFR calc non Af Amer: >60 mL/min (ref >60)
Glucose, Bld: 439 mg/dL — ABNORMAL HIGH (ref 70–99)
Potassium: 4.5 mmol/L (ref 3.5–5.1)
Sodium: 130 mmol/L — ABNORMAL LOW (ref 135–145)

## 2018-07-12 LAB — GLUCOSE, CAPILLARY
Glucose-Capillary: 281 mg/dL — ABNORMAL HIGH (ref 70–99)
Glucose-Capillary: 375 mg/dL — ABNORMAL HIGH (ref 70–99)
Glucose-Capillary: 404 mg/dL — ABNORMAL HIGH (ref 70–99)
Glucose-Capillary: 272 mg/dL — ABNORMAL HIGH (ref 70–99)
Glucose-Capillary: 171 mg/dL — ABNORMAL HIGH (ref 70–99)

## 2018-07-12 LAB — CBC
HCT: 25.6 % — ABNORMAL LOW (ref 36.0–46.0)
Hemoglobin: 7.6 g/dL — ABNORMAL LOW (ref 12.0–15.0)
MCH: 31 pg (ref 26.0–34.0)
MCHC: 29.7 g/dL — ABNORMAL LOW (ref 30.0–36.0)
MCV: 104.5 fL — ABNORMAL HIGH (ref 80.0–100.0)
Platelets: 301 10*3/uL (ref 150–400)
RBC: 2.45 MIL/uL — ABNORMAL LOW (ref 3.87–5.11)
RDW: 17.1 % — AB (ref 11.5–15.5)
WBC: 6.1 10*3/uL (ref 4.0–10.5)
nRBC: 0 % (ref 0.0–0.2)

## 2018-07-12 LAB — PREPARE RBC (CROSSMATCH)

## 2018-07-12 MED ORDER — FERROUS SULFATE 325 (65 FE) MG PO TABS
325.0000 mg | ORAL_TABLET | Freq: Every day | ORAL | Status: DC
  Administered 2018-07-13 – 2018-07-16 (×4): 325 mg via ORAL
  Filled 2018-07-12 (×4): qty 1

## 2018-07-12 MED ORDER — INSULIN GLARGINE 100 UNIT/ML ~~LOC~~ SOLN
20.0000 [IU] | Freq: Every day | SUBCUTANEOUS | Status: DC
  Administered 2018-07-12 – 2018-07-16 (×5): 20 [IU] via SUBCUTANEOUS
  Filled 2018-07-12 (×5): qty 0.2

## 2018-07-12 MED ORDER — SODIUM CHLORIDE 0.9% IV SOLUTION
Freq: Once | INTRAVENOUS | Status: AC
  Administered 2018-07-12: 13:00:00 via INTRAVENOUS

## 2018-07-12 NOTE — Progress Notes (Signed)
INFECTIOUS DISEASE PROGRESS NOTE  ID: Kaitlin Vargas is a 68 y.o. female with  Principal Problem:   MSSA bacteremia Active Problems:   Essential hypertension   Type 2 diabetes mellitus with hyperlipidemia (HCC)   Diabetic foot ulcer ([place])   Epidural abscess   Abscess of spinal cord due to bacteria   S/P lumbar fusion  Subjective: C/o back pain.   Abtx:  Anti-infectives (From admission, onward)   Start     Dose/Rate Route Frequency Ordered Stop   07/11/18 2200  ceFAZolin (ANCEF) IVPB  Status:  Discontinued    Note to Pharmacy:  Indication: MSSA discitis/osteomyelitis Last Day of Therapy: 08/13/2018 Labs - Once weekly:  CBC/D and BMP, Labs - Every other week:  ESR and CRP     2 g Intravenous Every 8 hours 07/11/18 2016 07/11/18 2157   07/11/18 2200  ceFAZolin (ANCEF) IVPB 2g/100 mL premix     2 g 200 mL/hr over 30 Minutes Intravenous Every 8 hours 07/11/18 2158 08/13/18 2159   07/11/18 1556  vancomycin (VANCOCIN) powder  Status:  Discontinued       As needed 07/11/18 1556 07/11/18 1803   07/11/18 1554  bacitracin 50,000 Units in sodium chloride 0.9 % 500 mL irrigation  Status:  Discontinued       As needed 07/11/18 1554 07/11/18 1803   07/11/18 1418  ceFAZolin (ANCEF) 2-4 GM/100ML-% IVPB  Status:  Discontinued    Note to Pharmacy:  [facility]   : cabinet override      07/11/18 1418 07/11/18 1517   07/11/18 1200  ceFAZolin (ANCEF) IVPB 2g/100 mL premix  Status:  Discontinued     2 g 200 mL/hr over 30 Minutes Intravenous On call to O.R. 07/11/18 1155 07/11/18 1854   07/11/18 1130  rifampin (RIFADIN) capsule 300 mg     300 mg Oral Every 12 hours 07/11/18 1017     07/10/18 1415  ceFAZolin (ANCEF) IVPB 2g/100 mL premix     2 g 200 mL/hr over 30 Minutes Intravenous Every 8 hours 07/10/18 1412        Medications:  Scheduled: . sodium chloride   Intravenous Once  . aspirin  81 mg Oral q morning - 10a  . bethanechol  10 mg Oral TID  . feeding supplement (GLUCERNA SHAKE)   237 mL Oral TID WC  . feeding supplement (PRO-STAT SUGAR FREE 64)  30 mL Oral BID  . [START ON 07/13/2018] ferrous sulfate  325 mg Oral Q breakfast  . furosemide  40 mg Oral Daily  . insulin aspart  0-15 Units Subcutaneous TID WC  . insulin glargine  20 Units Subcutaneous Daily  . ketoconazole   Topical BID  . lisinopril  10 mg Oral Daily  . metoprolol tartrate  25 mg Oral BID  . morphine  30 mg Oral Q12H  . multivitamin with minerals  1 tablet Oral Daily  . pantoprazole  40 mg Oral Daily  . rifampin  300 mg Oral Q12H  . senna  1 tablet Oral BID  . sodium chloride flush  10-40 mL Intracatheter Q12H  . sodium chloride flush  3 mL Intravenous Q12H  . vitamin B-12  1,000 mcg Oral Daily    Objective: Vital signs in last 24 hours: Temp:  [97.7 F (36.5 C)-99.1 F (37.3 C)] 99.1 F (37.3 C) (02/22 1126) Pulse Rate:  [98-115] 105 (02/22 1126) Resp:  [15-23] 16 (02/22 1126) BP: (111-160)/(61-86) 111/74 (02/22 1126) SpO2:  [92 %-100 %] 98 % (  02/22 0722) Weight:  [72.6 kg] 72.6 kg (02/21 1433)   General appearance: alert, cooperative and no distress Resp: clear to auscultation bilaterally Cardio: regular rate and rhythm GI: normal findings: bowel sounds normal and soft, non-tender Extremities: edema 1+ and RUE PIC clean  Lab Results Recent Labs    07/12/18 0722 07/12/18 1143  WBC 6.1  --   HGB 7.6*  --   HCT 25.6*  --   NA  --  130*  K  --  4.5  CL  --  94*  CO2  --  27  BUN  --  17  CREATININE  --  0.65   Liver Panel No results for input(s): PROT, ALBUMIN, AST, ALT, ALKPHOS, BILITOT, BILIDIR, IBILI in the last 72 hours. Sedimentation Rate No results for input(s): ESRSEDRATE in the last 72 hours. C-Reactive Protein No results for input(s): CRP in the last 72 hours.  Microbiology: Recent Results (from the past 240 hour(s))  Urine Culture     Status: Abnormal   Collection Time: 07/06/18  8:37 AM  Result Value Ref Range Status   Specimen Description URINE, RANDOM   Final   Special Requests NONE  Final   Culture (A)  Final    <10,000 COLONIES/mL INSIGNIFICANT GROWTH Performed at [facility], [address] [place], [place] [address]    Report Status 07/07/2018 FINAL  Final  Aerobic/Anaerobic Culture (surgical/deep wound)     Status: None (Preliminary result)   Collection Time: 07/11/18  4:57 PM  Result Value Ref Range Status   Specimen Description TISSUE  Final   Special Requests L1 2 DISC SPACE  Final   Gram Stain   Final    NO WBC SEEN NO ORGANISMS SEEN Performed at [facility], [place] [address] [place], [place] [address]    Culture CULTURE REINCUBATED FOR BETTER GROWTH  Final   Report Status PENDING  Incomplete  Aerobic/Anaerobic Culture (surgical/deep wound)     Status: None (Preliminary result)   Collection Time: 07/11/18  4:58 PM  Result Value Ref Range Status   Specimen Description WOUND  Final   Special Requests L1 2 DISC SPACE  Final   Gram Stain   Final    RARE WBC PRESENT, PREDOMINANTLY PMN NO ORGANISMS SEEN Performed at [facility], [place] [address] [place], [place] [address]    Culture NO GROWTH < 24 HOURS  Final   Report Status PENDING  Incomplete    Studies/Results: Dg Thoracolumabar Spine  Result Date: 07/11/2018 CLINICAL DATA:  Thoracolumbar fusion.  Spinal infection. EXAM: THORACOLUMBAR SPINE 1V COMPARISON:  Lumbar MRI 07/09/2018 FINDINGS: C-arm images were obtained of the thoracic and lumbar spine. Bilateral pedicle screw and posterior rod fusion extending from T10 through L3. Endplate erosion at Y8-5 consistent with infection IMPRESSION: Pedicle screw and posterior rod fusion T10 through L3. Electronically Signed   By: Franchot Gallo M.D.   On: 07/11/2018 19:30   Dg C-arm 1-60 Min  Result Date: 07/11/2018 CLINICAL DATA:  Thoracolumbar fusion.  Spinal infection. EXAM: THORACOLUMBAR SPINE 1V COMPARISON:  Lumbar MRI 07/09/2018 FINDINGS: C-arm images were obtained of the thoracic and lumbar spine.  Bilateral pedicle screw and posterior rod fusion extending from T10 through L3. Endplate erosion at O2-7 consistent with infection IMPRESSION: Pedicle screw and posterior rod fusion T10 through L3. Electronically Signed   By: Franchot Gallo M.D.   On: 07/11/2018 19:30     Assessment/Plan: DM2 MSSA bacteremia T10-L3 fusion (2-21) L1-2 abscess  Total days of antibiotics: 2 ancef-rifampin  Repeat BCx sent 2-21 Needs TEE Watch op Cx.  Has PIC, will need prolonged IV anbx. I explained this to her and her family.  Will continue to watch, will chart check on Sunday, available as needed.          Bobby Rumpf MD, FACP Infectious Diseases (pager) [phone] [email or web address] 07/12/2018, 12:55 PM  LOS: 2 days

## 2018-07-12 NOTE — Evaluation (Signed)
Physical Therapy Evaluation Patient Details Name: Kaitlin Vargas MRN: [id number] DOB: 1950-06-22 Today's Date: 07/12/2018   History of Present Illness  Pt is a 68 y.o. female who was admitted to [facility] 06/16/18 with back pain. 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 She transferred to [facility] for lumbar puncture on 1/29; during procedure, pt with active bleeding and cardiac arrest requiring CPR. PMH includes peripheral neuropathy, HTN, DM.  On 06/26/2018 she was transferred to inpatient rehab to continue inpatient rehab with IV antibiotics and PICC line on right arm. Abscess not responding to antibiotics. Pt with continued pain and inability to ambulate. On 07/10/18 pt re-admitted to acute care. She underwent posterior fixation T10-L3 07/11/18.     Clinical Impression  Patient is s/p above surgery resulting in the deficits listed below (see PT Problem List). PTA on 06/16/18 pt lived at home with husband, modified independent with mobility. On eval, she required min assist bed mobility, min assist sit to stand and min assist ambulation 5 feet with RW. Pt educated on 3/3 back precautions. Mobility limited by pain. Patient will benefit from skilled PT to increase their independence and safety with mobility (while adhering to their precautions) to allow discharge to the venue listed below.     Follow Up Recommendations CIR    Equipment Recommendations  Other (comment)(TBD)    Recommendations for Other Services Rehab consult     Precautions / Restrictions Precautions Precautions: Fall;Back Precaution Comments: Pt educated on 3/3 back precautions.  Required Braces or Orthoses: Spinal Brace Spinal Brace: Lumbar corset;Applied in sitting position Restrictions Other Position/Activity Restrictions: brace not available at time of eval      Mobility  Bed Mobility Overal bed mobility: Needs Assistance Bed Mobility: Rolling;Sidelying to Sit Rolling: Min assist Sidelying to sit: Mod  assist;HOB elevated       General bed mobility comments: +rail, cues for logroll, assist to elevate trunk and scoot to EOB  Transfers Overall transfer level: Needs assistance Equipment used: Rolling walker (2 wheeled) Transfers: Sit to/from Stand Sit to Stand: Min assist;From elevated surface         General transfer comment: cues for hand placement, assist to power up and stabilize balance  Ambulation/Gait Ambulation/Gait assistance: Min assist Gait Distance (Feet): 5 Feet Assistive device: Rolling walker (2 wheeled) Gait Pattern/deviations: Step-through pattern;Decreased stride length Gait velocity: decreased   General Gait Details: cues for sequencing and to stay close to [employer] Mobility    Modified Rankin (Stroke Patients Only)       Balance Overall balance assessment: Needs assistance Sitting-balance support: Feet supported;No upper extremity supported Sitting balance-Leahy Scale: Fair     Standing balance support: Bilateral upper extremity supported;During functional activity Standing balance-Leahy Scale: Poor Standing balance comment: reliant on RW and external support                             Pertinent Vitals/Pain Pain Assessment: 0-10 Pain Score: 10-Worst pain ever Pain Location: sx site Pain Descriptors / Indicators: Sore Pain Intervention(s): Monitored during session;Repositioned    Home Living Family/patient expects to be discharged to:: Private residence Living Arrangements: Spouse/significant other;Children Available Help at Discharge: Family;Available 24 hours/day Type of Home: House Home Access: Ramped entrance     Home Layout: One level Home Equipment: Walker - 2 wheels;Cane - single point Additional Comments: Ambulated with cane and RW PTA  Prior Function Level of Independence: Independent with assistive device(s)               Hand Dominance        Extremity/Trunk  Assessment   Upper Extremity Assessment Upper Extremity Assessment: Generalized weakness    Lower Extremity Assessment Lower Extremity Assessment: Generalized weakness    Cervical / Trunk Assessment Cervical / Trunk Assessment: Other exceptions Cervical / Trunk Exceptions: s/p fusion  Communication   Communication: No difficulties  Cognition Arousal/Alertness: Awake/alert Behavior During Therapy: WFL for tasks assessed/performed Overall Cognitive Status: No family/caregiver present to determine baseline cognitive functioning Area of Impairment: Safety/judgement;Problem solving;Memory                     Memory: Decreased recall of precautions   Safety/Judgement: Decreased awareness of deficits   Problem Solving: Slow processing;Difficulty sequencing;Requires verbal cues        General Comments General comments (skin integrity, edema, etc.): Max HR 120 during session    Exercises     Assessment/Plan    PT Assessment Patient needs continued PT services  PT Problem List Decreased strength;Decreased coordination;Cardiopulmonary status limiting activity;Pain;Decreased activity tolerance;Decreased knowledge of use of DME;Decreased balance;Decreased safety awareness;Decreased mobility;Decreased knowledge of precautions       PT Treatment Interventions DME instruction;Therapeutic exercise;Gait training;Balance training;Stair training;Neuromuscular re-education;Functional mobility training;Therapeutic activities;Patient/family education    PT Goals (Current goals can be found in the Care Plan section)  Acute Rehab PT Goals Patient Stated Goal: decrease pain PT Goal Formulation: With patient Time For Goal Achievement: 07/26/18 Potential to Achieve Goals: Good    Frequency Min 5X/week   Barriers to discharge        Co-evaluation               AM-PAC PT "6 Clicks" Mobility  Outcome Measure Help needed turning from your back to your side while in a flat  bed without using bedrails?: A Little Help needed moving from lying on your back to sitting on the side of a flat bed without using bedrails?: A Little Help needed moving to and from a bed to a chair (including a wheelchair)?: A Little Help needed standing up from a chair using your arms (e.g., wheelchair or bedside chair)?: A Little Help needed to walk in hospital room?: A Lot Help needed climbing 3-5 steps with a railing? : Total 6 Click Score: 15    End of Session Equipment Utilized During Treatment: Gait belt Activity Tolerance: Patient limited by pain Patient left: in chair;with call bell/phone within reach Nurse Communication: Mobility status PT Visit Diagnosis: Unsteadiness on feet (R26.81);Difficulty in walking, not elsewhere classified (R26.2);Muscle weakness (generalized) (M62.81)    Time: 7253-6644 PT Time Calculation (min) (ACUTE ONLY): 21 min   Charges:   PT Evaluation $PT Eval Moderate Complexity: 1 Mod          Lorrin Goodell, PT  Office # [phone] Pager [phone]   Lorriane Shire 07/12/2018, 8:53 AM

## 2018-07-12 NOTE — Evaluation (Signed)
Occupational Therapy Evaluation Patient Details Name: Kaitlin Vargas MRN: [id number] DOB: 1951-03-06 Today's Date: 07/12/2018    History of Present Illness Pt is a 68 y.o. female who was admitted to [facility] 06/16/18 with back pain. 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 She transferred to [facility] for lumbar puncture on 1/29; during procedure, pt with active bleeding and cardiac arrest requiring CPR. PMH includes peripheral neuropathy, HTN, DM.  On 06/26/2018 she was transferred to inpatient rehab to continue inpatient rehab with IV antibiotics and PICC line on right arm. Abscess not responding to antibiotics. Pt with continued pain and inability to ambulate. On 07/10/18 pt re-admitted to acute care. She underwent posterior fixation T10-L3 07/11/18.    Clinical Impression   Patient admitted for above an limited by problem list below, including generalized weakness, pain, decreased activity tolerance, and impaired cognition. Patient seated in recliner upon entry, agreeable to OT-- reports has been up for "a long time".  BP assessed seated at 90/54.  Patient agreeable to transfer back to supine, but requires max assist +2 to ascend into standing due to hypotensive and prolonged sitting.  Transitioned to supine with max assist, BP improved to 113/60 in supine. Requires min assist for UB ADLs and total assist for LB ADLs.  Continues to report increased pain in back, decreasing from 10/10 to 9/10 with changing positions. Educated on safety, precautions, mobility, and importance of sitting up throughout the day with rest breaks (ie each meal, instead of all day as she is progressing)- pt verbalized understanding. Throughout session required maximal redirection and cueing due to poor attention, recall and sequencing. Patient will benefit from continued OT services while admitted and after dc at CIR level in order to optimize independence and safety with ADLs/mobility.     Follow Up Recommendations  CIR;Supervision/Assistance - 24 hour    Equipment Recommendations  Other (comment)(TBd at next venue of care)    Recommendations for Other Services       Precautions / Restrictions Precautions Precautions: Fall;Back Precaution Comments: patient able to recall 1/3 precautions, re-educated on precautions and requires cueing to adhere to during functional tasks  Required Braces or Orthoses: Spinal Brace Spinal Brace: Lumbar corset;Applied in sitting position Restrictions Weight Bearing Restrictions: No Other Position/Activity Restrictions: brace not available at time of eval(RN reports she plans to follow up with orthotech )      Mobility Bed Mobility Overal bed mobility: Needs Assistance Bed Mobility: Rolling;Sit to Sidelying Rolling: Min assist       Sit to sidelying: Max assist General bed mobility comments: + rail, requires assist for B LEs to EOB and trunk support  Transfers Overall transfer level: Needs assistance Equipment used: Rolling walker (2 wheeled) Transfers: Sit to/from Stand Sit to Stand: Max assist;+2 physical assistance;+2 safety/equipment Stand pivot transfers: Max assist;+2 safety/equipment;+2 physical assistance       General transfer comment: requires cueing for hand placement and safety, to follow precautions and to sequencing task; +2 assist to fully ascend and pivot to EOB using RW    Balance Overall balance assessment: Needs assistance Sitting-balance support: Feet supported;No upper extremity supported Sitting balance-Leahy Scale: Fair     Standing balance support: Bilateral upper extremity supported;During functional activity Standing balance-Leahy Scale: Poor Standing balance comment: reliant on RW and external support                           ADL either performed or assessed with clinical judgement  ADL Overall ADL's : Needs assistance/impaired     Grooming: Set up;Sitting   Upper Body Bathing: Minimal  assistance;Sitting   Lower Body Bathing: Total assistance;+2 for physical assistance;Sit to/from stand   Upper Body Dressing : Minimal assistance;Sitting   Lower Body Dressing: Total assistance;+2 for physical assistance;Sit to/from stand   Toilet Transfer: +2 for physical assistance;+2 for safety/equipment;Stand-pivot;RW;Maximal assistance Toilet Transfer Details (indicate cue type and reason): simulated from recliner to EOB          Functional mobility during ADLs: Maximal assistance;+2 for physical assistance;+2 for safety/equipment;Rolling walker General ADL Comments: patient requires constant redirection to attend to current task, limited by pain, tolerance and hypotensive during session      Vision         Perception     Praxis      Pertinent Vitals/Pain Pain Assessment: 0-10 Pain Score: 10-Worst pain ever Pain Location: back and legs  Pain Descriptors / Indicators: Discomfort;Grimacing;Operative site guarding Pain Intervention(s): Monitored during session;Repositioned     Hand Dominance     Extremity/Trunk Assessment Upper Extremity Assessment Upper Extremity Assessment: Generalized weakness   Lower Extremity Assessment Lower Extremity Assessment: Defer to PT evaluation   Cervical / Trunk Assessment Cervical / Trunk Assessment: Other exceptions Cervical / Trunk Exceptions: s/p fusion   Communication Communication Communication: No difficulties   Cognition Arousal/Alertness: Awake/alert Behavior During Therapy: WFL for tasks assessed/performed Overall Cognitive Status: No family/caregiver present to determine baseline cognitive functioning Area of Impairment: Attention;Memory;Following commands;Safety/judgement;Awareness;Problem solving                   Current Attention Level: Sustained Memory: Decreased recall of precautions Following Commands: Follows one step commands with increased time Safety/Judgement: Decreased awareness of  deficits;Decreased awareness of safety Awareness: Emergent Problem Solving: Slow processing;Difficulty sequencing;Requires verbal cues General Comments: patient requires cueing for safety awareness, poor attention to task and highly distracted lines    General Comments  Hypotensive BP 90/54 seated in recliner upon entry, RN assisted therapist to EOB with BP 113/60 supine     Exercises     Shoulder Instructions      Home Living Family/patient expects to be discharged to:: Private residence Living Arrangements: Spouse/significant other;Children Available Help at Discharge: Family;Available 24 hours/day Type of Home: House Home Access: Ramped entrance     Home Layout: One level     Bathroom Shower/Tub: Teacher, early years/pre: Standard     Home Equipment: Environmental consultant - 2 wheels;Cane - single point          Prior Functioning/Environment Level of Independence: Independent with assistive device(s)                 OT Problem List: Decreased strength;Decreased activity tolerance;Impaired balance (sitting and/or standing);Decreased cognition;Decreased coordination;Decreased safety awareness;Decreased knowledge of use of DME or AE;Decreased knowledge of precautions;Pain      OT Treatment/Interventions: Self-care/ADL training;DME and/or AE instruction;Patient/family education;Therapeutic exercise;Therapeutic activities;Balance training    OT Goals(Current goals can be found in the care plan section) Acute Rehab OT Goals Patient Stated Goal: decrease pain OT Goal Formulation: With patient Time For Goal Achievement: 07/26/18 Potential to Achieve Goals: Good  OT Frequency: Min 2X/week   Barriers to D/C:            Co-evaluation              AM-PAC OT "6 Clicks" Daily Activity     Outcome Measure Help from another person eating meals?: A Little Help from another  person taking care of personal grooming?: A Little Help from another person toileting, which  includes using toliet, bedpan, or urinal?: Total Help from another person bathing (including washing, rinsing, drying)?: A Lot Help from another person to put on and taking off regular upper body clothing?: A Little Help from another person to put on and taking off regular lower body clothing?: Total 6 Click Score: 13   End of Session Equipment Utilized During Treatment: Rolling walker Nurse Communication: Mobility status;Precautions  Activity Tolerance: Patient limited by pain;Patient limited by lethargy Patient left: in chair;with call bell/phone within reach;with bed alarm set;with nursing/sitter in room  OT Visit Diagnosis: Unsteadiness on feet (R26.81);Pain;Other symptoms and signs involving cognitive function;Muscle weakness (generalized) (M62.81) Pain - part of body: (back)                Time: 1445-1516 OT Time Calculation (min): 31 min Charges:  OT General Charges $OT Visit: 1 Visit OT Evaluation $OT Eval Moderate Complexity: 1 Mod OT Treatments $Self Care/Home Management : 8-22 mins  Delight Stare, OT Acute Rehabilitation Services Pager [phone] Office [phone]   Delight Stare 07/12/2018, 3:34 PM

## 2018-07-12 NOTE — Progress Notes (Signed)
Patient ID: Kaitlin Vargas, female   DOB: 04/25/1951, 68 y.o.   MRN: [id number] Subjective: Patient reports she feels better, OOB to chair  Objective: Vital signs in last 24 hours: Temp:  [97.7 F (36.5 C)-98.8 F (37.1 C)] 98.1 F (36.7 C) (02/22 0722) Pulse Rate:  [98-115] 115 (02/22 0722) Resp:  [15-23] 17 (02/22 0722) BP: (115-160)/(61-86) 115/61 (02/22 0722) SpO2:  [92 %-100 %] 98 % (02/22 0722) Weight:  [72.6 kg] 72.6 kg (02/21 1433)  Intake/Output from previous day: 02/21 0701 - 02/22 0700 In: 3267.2 [P.O.:480; I.V.:1827.2; IV Piggyback:910] Out: 2995 [Urine:2450; Drains:95; Blood:450] Intake/Output this shift: No intake/output data recorded.  Neurologic: Grossly normal with improved movement of legs  Lab Results: Lab Results  Component Value Date   WBC 6.1 07/12/2018   HGB 7.6 (L) 07/12/2018   HCT 25.6 (L) 07/12/2018   MCV 104.5 (H) 07/12/2018   PLT 301 07/12/2018   Lab Results  Component Value Date   INR 1.04 06/17/2018   BMET Lab Results  Component Value Date   NA 135 07/08/2018   K 3.5 07/08/2018   CL 97 (L) 07/08/2018   CO2 29 07/08/2018   GLUCOSE 108 (H) 07/08/2018   BUN 15 07/08/2018   CREATININE 0.60 07/08/2018   CALCIUM 8.3 (L) 07/08/2018    Studies/Results: Dg Thoracolumabar Spine  Result Date: 07/11/2018 CLINICAL DATA:  Thoracolumbar fusion.  Spinal infection. EXAM: THORACOLUMBAR SPINE 1V COMPARISON:  Lumbar MRI 07/09/2018 FINDINGS: C-arm images were obtained of the thoracic and lumbar spine. Bilateral pedicle screw and posterior rod fusion extending from T10 through L3. Endplate erosion at K4-4 consistent with infection IMPRESSION: Pedicle screw and posterior rod fusion T10 through L3. Electronically Signed   By: Franchot Gallo M.D.   On: 07/11/2018 19:30   Mr Jeri Cos WN Contrast  Result Date: 07/10/2018 CLINICAL DATA:  Encephalopathy.  Rule out infection EXAM: MRI HEAD WITHOUT AND WITH CONTRAST TECHNIQUE: Multiplanar, multiecho pulse sequences  of the brain and surrounding structures were obtained without and with intravenous contrast. CONTRAST:  7.5 mL Gadovist IV COMPARISON:  None. FINDINGS: Brain: Generalized atrophy without hydrocephalus. Mild chronic changes in the white matter. No acute infarct. Negative for hemorrhage or mass. No edema. Normal enhancement postcontrast infusion. No enhancing mass or abscess. Leptomeningeal enhancement normal. Vascular: Normal vascular flow voids. Skull and upper cervical spine: Negative Sinuses/Orbits: Mild mucosal edema paranasal sinuses. Bilateral mastoid effusion. Bilateral cataract surgery Other: None IMPRESSION: No acute intracranial abnormality Mild atrophy and mild chronic microvascular ischemic change in the white matter. Electronically Signed   By: Franchot Gallo M.D.   On: 07/10/2018 12:31   Dg C-arm 1-60 Min  Result Date: 07/11/2018 CLINICAL DATA:  Thoracolumbar fusion.  Spinal infection. EXAM: THORACOLUMBAR SPINE 1V COMPARISON:  Lumbar MRI 07/09/2018 FINDINGS: C-arm images were obtained of the thoracic and lumbar spine. Bilateral pedicle screw and posterior rod fusion extending from T10 through L3. Endplate erosion at U2-7 consistent with infection IMPRESSION: Pedicle screw and posterior rod fusion T10 through L3. Electronically Signed   By: Franchot Gallo M.D.   On: 07/11/2018 19:30    Assessment/Plan: Doing well after surgery, seems improved  Acute blood loss anemia vs anemia of chronic diease vs both -  Add iron, consider transfusion since tachycardic. Repeat CBC tomorrow  Check bmp, tachycardic and urine looks concentrated  Estimated body mass index is 33.44 kg/m as calculated from the following:   Height as of this encounter: 4\' 10"  (1.473 m).   Weight as of this  encounter: 72.6 kg.    LOS: 2 days    Eustace Moore 07/12/2018, 9:11 AM

## 2018-07-12 NOTE — Progress Notes (Signed)
PROGRESS NOTE    Kaitlin Vargas  HYI:[id number] DOB: 06-Apr-1951 DOA: 07/10/2018 PCP: [name], MD   Brief Narrative: Kaitlin Vargas is a 68 y.o. femalewith medical history significant ofhypertension, hyperlipidemia, B12 deficiency, type 2 diabetes currently on insulin. Patient is being treated for MSSA bacteremia and developed worsening back pain found to have worsening abscess, s/p surgery per neurosurgery.   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 -ID recommendations: Continue Cefazolin and add Rifampin  Spinal cord abscess S/p lumbar laminectomy, posterior fixation, arthrodesis on 2/21. Cultures obtained on 2/21 -Neurosurgery recommendations  Essential hypertension -Continue metoprolol and lisinopril  Tachycardia Sinus. Hemoglobin of 7.6 s/p surgery. -Continue home metoprolol  Acute blood loss anemia In setting of surgery. -2 units PRBC  Diabetes mellitus, type 2 -Continue SSI -Restart Lantus 20 units  Increased nutrition needs -Continue protein supplement  Chronic pain Continue MS contin   DVT prophylaxis: SCDs Code Status:   Code Status: Full Code Family Communication: None at bedside Disposition Plan: Discharge pending infectious workup   Consultants:   Neurosurgery  Infectious disease  Procedures:  1. Decompressive lumbar laminectomy lumbar 1-2 left with hemi-facetectomy and sublaminar decompression and discectomy.  Disc was sent for biopsy and culture; Posterior fixation T10-L3 inclusive using Alphatec cortical pedicle screws; Intertransverse arthrodesis T10-L3 using morcellized autograft and allograft soaked with a bone marrow aspirate obtained through a separate  fascial incision over the left iliac crest. (07/11/2018)  Antimicrobials:  Cefazolin  Rifampin    Subjective: Back pain improved.  Objective: Vitals:   07/11/18 2130 07/11/18 2330 07/12/18 0324 07/12/18 0722  BP: (!) 141/77 126/74 138/71 115/61  Pulse: (!) 113 (!) 107 (!) 111 (!) 115  Resp:  (!) 23 (!) 21 17  Temp:  98.8 F (37.1 C) 97.7 F (36.5 C) 98.1 F (36.7 C)  TempSrc:  Oral Oral Oral  SpO2:  97% 100% 98%  Weight:      Height:        Intake/Output Summary (Last 24 hours) at 07/12/2018 1049 Last data filed at 07/12/2018 0335 Gross per 24 hour  Intake 3267.21 ml  Output 2995 ml  Net 272.21 ml   Filed Weights   07/11/18 1433  Weight: 72.6 kg    Examination:  General exam: Appears calm and comfortable Respiratory system: Clear to auscultation. Respiratory effort normal. Cardiovascular system: S1 & S2 heard, Tachycardia. No murmurs, rubs, gallops or clicks. Gastrointestinal system: Abdomen is nondistended, soft and nontender. No organomegaly or masses felt. Normal bowel sounds heard. Central nervous system: Alert and oriented. No focal neurological deficits. Extremities: No edema. No calf tenderness Skin: No cyanosis. No rashes Psychiatry: Judgement and insight appear normal. Mood & affect appropriate.     Data Reviewed: I have personally reviewed following labs and imaging studies  CBC: Recent Labs  Lab 07/08/18 0515 07/12/18 0722  WBC 4.6 6.1  HGB 10.0* 7.6*  HCT 32.5* 25.6*  MCV 102.2* 104.5*  PLT 269 786   Basic Metabolic Panel: Recent Labs  Lab 07/08/18 0515  NA 135  K 3.5  CL 97*  CO2 29  GLUCOSE 108*  BUN 15  CREATININE 0.60  CALCIUM 8.3*   GFR: Estimated Creatinine Clearance: 56.2 mL/min (  by C-G formula based on SCr of 0.6 mg/dL). Liver Function Tests: Recent Labs  Lab 07/08/18 0515  AST 14*  ALT <5  ALKPHOS 121  BILITOT 0.5  PROT 5.7*  ALBUMIN 1.9*   No results for input(s): LIPASE, AMYLASE in the last 168 hours. No  results for input(s): AMMONIA in the last 168 hours. Coagulation Profile: No results for input(s): INR, PROTIME in the last 168 hours. Cardiac Enzymes: No results for input(s): CKTOTAL, CKMB, CKMBINDEX, TROPONINI in the last 168 hours. BNP (last 3 results) No results for input(s): PROBNP in the last 8760 hours. HbA1C: No results for input(s): HGBA1C in the last 72 hours. CBG: Recent Labs  Lab 07/11/18 1346 07/11/18 1426 07/11/18 1808 07/11/18 2113 07/12/18 0725  GLUCAP 199* 162* 212* 224* 281*   Lipid Profile: No results for input(s): CHOL, HDL, LDLCALC, TRIG, CHOLHDL, LDLDIRECT in the last 72 hours. Thyroid Function Tests: No results for input(s): TSH, T4TOTAL, FREET4, T3FREE, THYROIDAB in the last 72 hours. Anemia Panel: No results for input(s): VITAMINB12, FOLATE, FERRITIN, TIBC, IRON, RETICCTPCT in the last 72 hours. Sepsis Labs: No results for input(s): PROCALCITON, LATICACIDVEN in the last 168 hours.  Recent Results (from the past 240 hour(s))  Urine Culture     Status: Abnormal   Collection Time: 07/06/18  8:37 AM  Result Value Ref Range Status   Specimen Description URINE, RANDOM  Final   Special Requests NONE  Final   Culture (A)  Final    <10,000 COLONIES/mL INSIGNIFICANT GROWTH Performed at [facility], [address], [place], [place] [address]    Report Status 07/07/2018 FINAL  Final  Aerobic/Anaerobic Culture (surgical/deep wound)     Status: None (Preliminary result)   Collection Time: 07/11/18  4:57 PM  Result Value Ref Range Status   Specimen Description TISSUE  Final   Special Requests L1 2 DISC SPACE  Final   Gram Stain   Final    NO WBC SEEN NO ORGANISMS SEEN Performed at [facility], [place] [address], [place], [place] [address]    Culture CULTURE REINCUBATED FOR BETTER GROWTH  Final   Report Status PENDING  Incomplete  Aerobic/Anaerobic Culture (surgical/deep wound)     Status: None (Preliminary result)   Collection Time: 07/11/18   4:58 PM  Result Value Ref Range Status   Specimen Description WOUND  Final   Special Requests L1 2 DISC SPACE  Final   Gram Stain   Final    RARE WBC PRESENT, PREDOMINANTLY PMN NO ORGANISMS SEEN Performed at [facility], [place] [address], [place], [place] [address]    Culture PENDING  Incomplete   Report Status PENDING  Incomplete         Radiology Studies: Dg Thoracolumabar Spine  Result Date: 07/11/2018 CLINICAL DATA:  Thoracolumbar fusion.  Spinal infection. EXAM: THORACOLUMBAR SPINE 1V COMPARISON:  Lumbar MRI 07/09/2018 FINDINGS: C-arm images were obtained of the thoracic and lumbar spine. Bilateral pedicle screw and posterior rod fusion extending from T10 through L3. Endplate erosion at V8-9 consistent with infection IMPRESSION: Pedicle screw and posterior rod fusion T10 through L3. Electronically Signed   By: Franchot Gallo M.D.   On: 07/11/2018 19:30   Mr Jeri Cos FY Contrast  Result Date: 07/10/2018 CLINICAL DATA:  Encephalopathy.  Rule out infection EXAM: MRI HEAD WITHOUT AND WITH CONTRAST TECHNIQUE: Multiplanar, multiecho pulse sequences of the brain and surrounding structures were obtained without and with intravenous contrast. CONTRAST:  7.5 mL Gadovist  IV COMPARISON:  None. FINDINGS: Brain: Generalized atrophy without hydrocephalus. Mild chronic changes in the white matter. No acute infarct. Negative for hemorrhage or mass. No edema. Normal enhancement postcontrast infusion. No enhancing mass or abscess. Leptomeningeal enhancement normal. Vascular: Normal vascular flow voids. Skull and upper cervical spine: Negative Sinuses/Orbits: Mild mucosal edema paranasal sinuses. Bilateral mastoid effusion. Bilateral cataract surgery Other: None IMPRESSION: No acute intracranial abnormality Mild atrophy and mild chronic microvascular ischemic change in the white matter. Electronically Signed   By: Franchot Gallo M.D.   On: 07/10/2018 12:31   Dg C-arm 1-60 Min  Result Date:  07/11/2018 CLINICAL DATA:  Thoracolumbar fusion.  Spinal infection. EXAM: THORACOLUMBAR SPINE 1V COMPARISON:  Lumbar MRI 07/09/2018 FINDINGS: C-arm images were obtained of the thoracic and lumbar spine. Bilateral pedicle screw and posterior rod fusion extending from T10 through L3. Endplate erosion at S8-1 consistent with infection IMPRESSION: Pedicle screw and posterior rod fusion T10 through L3. Electronically Signed   By: Franchot Gallo M.D.   On: 07/11/2018 19:30        Scheduled Meds: . aspirin  81 mg Oral q morning - 10a  . bethanechol  10 mg Oral TID  . feeding supplement (GLUCERNA SHAKE)  237 mL Oral TID WC  . feeding supplement (PRO-STAT SUGAR FREE 64)  30 mL Oral BID  . [START ON 07/13/2018] ferrous sulfate  325 mg Oral Q breakfast  . furosemide  40 mg Oral Daily  . insulin aspart  0-15 Units Subcutaneous TID WC  . ketoconazole   Topical BID  . lisinopril  10 mg Oral Daily  . metoprolol tartrate  25 mg Oral BID  . morphine  30 mg Oral Q12H  . multivitamin with minerals  1 tablet Oral Daily  . pantoprazole  40 mg Oral Daily  . rifampin  300 mg Oral Q12H  . senna  1 tablet Oral BID  . sodium chloride flush  10-40 mL Intracatheter Q12H  . sodium chloride flush  3 mL Intravenous Q12H  . vitamin B-12  1,000 mcg Oral Daily   Continuous Infusions: . sodium chloride    . 0.9 % NaCl with KCl 20 mEq / L 75 mL/hr at 07/11/18 2222  .  ceFAZolin (ANCEF) IV 2 g (07/12/18 0512)  .  ceFAZolin (ANCEF) IV 2 g (07/12/18 0601)  . methocarbamol (ROBAXIN) IV       LOS: 2 days     Cordelia Poche, MD [facility] 07/12/2018, 10:49 AM  If 7PM-7AM, please contact night-coverage [email or web address]

## 2018-07-12 NOTE — Progress Notes (Signed)
CRITICAL VALUE ALERT  Critical Value: Sample tissue site from surgery, gram stain negative, culture is growing staph aureus   Date & Time Notied:  07/12/2018 1545  Provider Notified: Dr. Johnnye Sima   Orders Received/Actions taken: No new orders

## 2018-07-13 LAB — TYPE AND SCREEN
ABO/RH(D): B POS
Antibody Screen: NEGATIVE
Unit division: 0
Unit division: 0

## 2018-07-13 LAB — BPAM RBC
Blood Product Expiration Date: 202003122359
Blood Product Expiration Date: 202003132359
ISSUE DATE / TIME: 202002221256
ISSUE DATE / TIME: 202002221615
Unit Type and Rh: 7300
Unit Type and Rh: 7300

## 2018-07-13 LAB — BASIC METABOLIC PANEL
Anion gap: 7 (ref 5–15)
BUN: 14 mg/dL (ref 8–23)
CHLORIDE: 98 mmol/L (ref 98–111)
CO2: 24 mmol/L (ref 22–32)
Calcium: 8 mg/dL — ABNORMAL LOW (ref 8.9–10.3)
Creatinine, Ser: 0.56 mg/dL (ref 0.44–1.00)
GFR calc Af Amer: >60 mL/min (ref >60)
GFR calc non Af Amer: >60 mL/min (ref >60)
Glucose, Bld: 323 mg/dL — ABNORMAL HIGH (ref 70–99)
Potassium: 4.4 mmol/L (ref 3.5–5.1)
Sodium: 129 mmol/L — ABNORMAL LOW (ref 135–145)

## 2018-07-13 LAB — CBC
HCT: 30.9 % — ABNORMAL LOW (ref 36.0–46.0)
Hemoglobin: 10 g/dL — ABNORMAL LOW (ref 12.0–15.0)
MCH: 31 pg (ref 26.0–34.0)
MCHC: 32.4 g/dL (ref 30.0–36.0)
MCV: 95.7 fL (ref 80.0–100.0)
Platelets: 288 10*3/uL (ref 150–400)
RBC: 3.23 MIL/uL — ABNORMAL LOW (ref 3.87–5.11)
RDW: 19.5 % — ABNORMAL HIGH (ref 11.5–15.5)
WBC: 6.7 10*3/uL (ref 4.0–10.5)
nRBC: 0 % (ref 0.0–0.2)

## 2018-07-13 LAB — GLUCOSE, CAPILLARY
Glucose-Capillary: 243 mg/dL — ABNORMAL HIGH (ref 70–99)
Glucose-Capillary: 281 mg/dL — ABNORMAL HIGH (ref 70–99)
Glucose-Capillary: 206 mg/dL — ABNORMAL HIGH (ref 70–99)
Glucose-Capillary: 102 mg/dL — ABNORMAL HIGH (ref 70–99)

## 2018-07-13 MED ORDER — MORPHINE SULFATE ER 15 MG PO TBCR
15.0000 mg | EXTENDED_RELEASE_TABLET | Freq: Two times a day (BID) | ORAL | Status: DC
  Administered 2018-07-13 – 2018-07-16 (×7): 15 mg via ORAL
  Filled 2018-07-13 (×7): qty 1

## 2018-07-13 MED ORDER — POLYETHYLENE GLYCOL 3350 17 G PO PACK
17.0000 g | PACK | Freq: Every day | ORAL | Status: DC
  Administered 2018-07-13 – 2018-07-16 (×4): 17 g via ORAL
  Filled 2018-07-13 (×4): qty 1

## 2018-07-13 NOTE — Progress Notes (Signed)
PROGRESS NOTE    [name]  STM:[id number] DOB: [birth year] DOA: 07/10/2018 PCP: [name], MD   Brief Narrative: Kaitlin Vargas is a 68 y.o. femalewith medical history significant ofhypertension, hyperlipidemia, B12 deficiency, type 2 diabetes currently on insulin. Patient is being treated for MSSA bacteremia and developed worsening back pain found to have worsening abscess, s/p surgery per neurosurgery.   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 -ID recommendations: Continue Cefazolin and add Rifampin  Spinal cord abscess S/p lumbar laminectomy, posterior fixation, arthrodesis on 2/21. Cultures obtained on 2/21 -Neurosurgery recommendations  Essential hypertension -Continue metoprolol and lisinopril  Tachycardia Sinus. Hemoglobin of 7.6 s/p surgery. Did not improve with blood transfusion. Sinus rhythm on telemetry -Continue home metoprolol -EKG  Acute blood loss anemia In setting of surgery. S/p 2 units of PRBC on 2/22. Hemoglobin improved from 7.6 to 10.0  Diabetes mellitus, type 2 -Continue SSI -Continue Lantus 20 units; may need to increase if blood sugar continues to be elevated  Increased nutrition needs -Continue protein supplement  Chronic pain Continue MS contin  Confusion Slightly today. Unsure of etiology. Could be related to narcotics -BMP today   DVT prophylaxis: SCDs Code Status:   Code Status: Full Code Family Communication: None at bedside Disposition Plan: Discharge pending infectious workup   Consultants:   Neurosurgery  Infectious disease  Procedures:  1. Decompressive lumbar laminectomy lumbar 1-2 left with hemi-facetectomy and sublaminar  decompression and discectomy.  Disc was sent for biopsy and culture; Posterior fixation T10-L3 inclusive using Alphatec cortical pedicle screws; Intertransverse arthrodesis T10-L3 using morcellized autograft and allograft soaked with a bone marrow aspirate obtained through a separate fascial incision over the left iliac crest. (07/11/2018)  Antimicrobials:  Cefazolin  Rifampin    Subjective: No concerns today.  Objective: Vitals:   07/12/18 1915 07/12/18 2100 07/12/18 2354 07/13/18 0300  BP: 113/60 136/67 130/65 134/72  Pulse: 99 (!) 107 (!) 105 (!) 106  Resp: 18 (!) 22 20 20   Temp: 98.4 F (36.9 C) 99.1 F (37.3 C) 99 F (37.2 C) 99.5 F (37.5 C)  TempSrc: Oral Oral Oral Oral  SpO2: 99% 98% 97% 98%  Weight:      Height:        Intake/Output Summary (Last 24 hours) at 07/13/2018 0916 Last data filed at 07/13/2018 0300 Gross per 24 hour  Intake 1619.96 ml  Output 1390 ml  Net 229.96 ml   Filed Weights   07/11/18 1433  Weight: 72.6 kg    Examination:  General exam: Appears calm and comfortable Respiratory system: Clear to auscultation. Respiratory effort normal. Cardiovascular system: S1 & S2 heard, tachycardia, normal rhythm. No murmurs, rubs, gallops or clicks. Gastrointestinal system: Abdomen is nondistended, soft and nontender. No organomegaly or masses felt. Normal bowel sounds heard. Central nervous system: Alert. No focal neurological deficits. Extremities: No edema. No calf tenderness Skin: No cyanosis. No rashes Psychiatry: Judgement and insight appear normal. Flat affect, depressed mood.      Data Reviewed: I have personally reviewed following labs and imaging studies  CBC: Recent Labs  Lab 07/08/18 0515 07/12/18 0722 07/13/18 0613  WBC 4.6 6.1 6.7  HGB 10.0* 7.6* 10.0*  HCT 32.5* 25.6*  30.9*  MCV 102.2* 104.5* 95.7  PLT 269 301 604   Basic Metabolic Panel: Recent Labs  Lab 07/08/18 0515 07/12/18 1143  NA 135 130*  K 3.5 4.5  CL 97* 94*    CO2 29 27  GLUCOSE 108* 439*  BUN 15 17  CREATININE 0.60 0.65  CALCIUM 8.3* 8.3*   GFR: Estimated Creatinine Clearance: 56.2 mL/min (by C-G formula based on SCr of 0.65 mg/dL). Liver Function Tests: Recent Labs  Lab 07/08/18 0515  AST 14*  ALT <5  ALKPHOS 121  BILITOT 0.5  PROT 5.7*  ALBUMIN 1.9*   No results for input(s): LIPASE, AMYLASE in the last 168 hours. No results for input(s): AMMONIA in the last 168 hours. Coagulation Profile: No results for input(s): INR, PROTIME in the last 168 hours. Cardiac Enzymes: No results for input(s): CKTOTAL, CKMB, CKMBINDEX, TROPONINI in the last 168 hours. BNP (last 3 results) No results for input(s): PROBNP in the last 8760 hours. HbA1C: No results for input(s): HGBA1C in the last 72 hours. CBG: Recent Labs  Lab 07/12/18 1128 07/12/18 1542 07/12/18 1749 07/12/18 2125 07/13/18 0819  GLUCAP 375* 404* 272* 171* 243*   Lipid Profile: No results for input(s): CHOL, HDL, LDLCALC, TRIG, CHOLHDL, LDLDIRECT in the last 72 hours. Thyroid Function Tests: No results for input(s): TSH, T4TOTAL, FREET4, T3FREE, THYROIDAB in the last 72 hours. Anemia Panel: No results for input(s): VITAMINB12, FOLATE, FERRITIN, TIBC, IRON, RETICCTPCT in the last 72 hours. Sepsis Labs: No results for input(s): PROCALCITON, LATICACIDVEN in the last 168 hours.  Recent Results (from the past 240 hour(s))  Urine Culture     Status: Abnormal   Collection Time: 07/06/18  8:37 AM  Result Value Ref Range Status   Specimen Description URINE, RANDOM  Final   Special Requests NONE  Final   Culture (A)  Final    <10,000 COLONIES/mL INSIGNIFICANT GROWTH Performed at [facility], [address], [place], [place] [address]    Report Status 07/07/2018 FINAL  Final  Aerobic/Anaerobic Culture (surgical/deep wound)     Status: None (Preliminary result)   Collection Time: 07/11/18  4:57 PM  Result Value Ref Range Status   Specimen Description TISSUE  Final    Special Requests L1 2 DISC SPACE  Final   Gram Stain NO WBC SEEN NO ORGANISMS SEEN   Final   Culture   Final    RARE STAPHYLOCOCCUS AUREUS CULTURE REINCUBATED FOR BETTER GROWTH CRITICAL RESULT CALLED TO, READ BACK BY AND VERIFIED WITH: C. MITCHELL,  RN CONCERNING CULTURE GROWTH AT 1191 ON 07/12/18 BY C. JESSUP, MLT. Performed at [facility], [place] [address], [place], [place] [address]    Report Status PENDING  Incomplete  Aerobic/Anaerobic Culture (surgical/deep wound)     Status: None (Preliminary result)   Collection Time: 07/11/18  4:58 PM  Result Value Ref Range Status   Specimen Description WOUND  Final   Special Requests L1 2 DISC SPACE  Final   Gram Stain   Final    RARE WBC PRESENT, PREDOMINANTLY PMN NO ORGANISMS SEEN Performed at [facility], [place] [address], [place],  [address]    Culture NO GROWTH < 24 HOURS  Final   Report Status PENDING  Incomplete         Radiology Studies: Dg Thoracolumabar Spine  Result Date: 07/11/2018 CLINICAL DATA:  Thoracolumbar fusion.  Spinal infection. EXAM: THORACOLUMBAR SPINE 1V COMPARISON:  Lumbar MRI 07/09/2018 FINDINGS: C-arm images were obtained  of the thoracic and lumbar spine. Bilateral pedicle screw and posterior rod fusion extending from T10 through L3. Endplate erosion at O6-7 consistent with infection IMPRESSION: Pedicle screw and posterior rod fusion T10 through L3. Electronically Signed   By: Franchot Gallo M.D.   On: 07/11/2018 19:30   Dg C-arm 1-60 Min  Result Date: 07/11/2018 CLINICAL DATA:  Thoracolumbar fusion.  Spinal infection. EXAM: THORACOLUMBAR SPINE 1V COMPARISON:  Lumbar MRI 07/09/2018 FINDINGS: C-arm images were obtained of the thoracic and lumbar spine. Bilateral pedicle screw and posterior rod fusion extending from T10 through L3. Endplate erosion at T2-4 consistent with infection IMPRESSION: Pedicle screw and posterior rod fusion T10 through L3. Electronically Signed   By: Franchot Gallo  M.D.   On: 07/11/2018 19:30        Scheduled Meds: . aspirin  81 mg Oral q morning - 10a  . bethanechol  10 mg Oral TID  . feeding supplement (GLUCERNA SHAKE)  237 mL Oral TID WC  . feeding supplement (PRO-STAT SUGAR FREE 64)  30 mL Oral BID  . ferrous sulfate  325 mg Oral Q breakfast  . furosemide  40 mg Oral Daily  . insulin aspart  0-15 Units Subcutaneous TID WC  . insulin glargine  20 Units Subcutaneous Daily  . ketoconazole   Topical BID  . lisinopril  10 mg Oral Daily  . metoprolol tartrate  25 mg Oral BID  . morphine  30 mg Oral Q12H  . multivitamin with minerals  1 tablet Oral Daily  . pantoprazole  40 mg Oral Daily  . rifampin  300 mg Oral Q12H  . senna  1 tablet Oral BID  . sodium chloride flush  10-40 mL Intracatheter Q12H  . sodium chloride flush  3 mL Intravenous Q12H  . vitamin B-12  1,000 mcg Oral Daily   Continuous Infusions: . sodium chloride    . 0.9 % NaCl with KCl 20 mEq / L 100 mL/hr at 07/12/18 2352  .  ceFAZolin (ANCEF) IV 2 g (07/13/18 0640)  .  ceFAZolin (ANCEF) IV 2 g (07/13/18 0543)  . methocarbamol (ROBAXIN) IV       LOS: 3 days     Cordelia Poche, MD [facility] 07/13/2018, 9:16 AM  If 7PM-7AM, please contact night-coverage [email or web address]

## 2018-07-13 NOTE — Progress Notes (Signed)
Physical Therapy Treatment Patient Details Name: Kaitlin Vargas MRN: [id number] DOB: June 26, 1950 Today's Date: 07/13/2018    History of Present Illness Pt is a 68 y.o. female who was admitted to [facility] 06/16/18 with back pain. 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 She transferred to [facility] for lumbar puncture on 1/29; during procedure, pt with active bleeding and cardiac arrest requiring CPR.  On 06/26/2018 she was transferred to inpatient rehab to continue inpatient rehab with IV antibiotics and PICC line on right arm. Abscess not responding to antibiotics. Pt with continued pain and inability to ambulate. On 07/10/18 pt re-admitted to acute care. She underwent posterior fixation T10-L3 07/11/18.  Post op anemia requiring PRBCs on 07/12/18.  PMH includes peripheral neuropathy, HTN, DM.      PT Comments    Pt remains impulsive, mildly confused.  Still awaiting back brace.  RN to call MD to clarify what type before paging ortho tech.  Pt requires mod assist to stand with walker to get to [facility] and recliner chair.  Gait is not yet safe to attempt and will require a second person for safety (mostly because pt sits prematurely and has poor safety awareness).  PT will continue to follow acutely for safe mobility progression  Follow Up Recommendations  CIR     Equipment Recommendations  3in1 (PT);Wheelchair (measurements PT);Wheelchair cushion (measurements PT)    Recommendations for Other Services   NA     Precautions / Restrictions Precautions Precautions: Fall;Back Precaution Comments: reviewed log roll to help comply with back precautions.  Required Braces or Orthoses: Spinal Brace;Other Brace Spinal Brace: Applied in sitting position(still no brace, RN to get order/call) Other Brace: JP drain in back    Mobility  Bed Mobility Overal bed mobility: Needs Assistance Bed Mobility: Rolling;Sidelying to Sit Rolling: Min assist Sidelying to sit: Min assist       General bed  mobility comments: Min assist to help her initiate log roll technique, pt using railing to help, min assist to support trunk to come up to sitting EOB.   Transfers Overall transfer level: Needs assistance Equipment used: Rolling walker (2 wheeled) Transfers: Sit to/from [facility] Sit to Stand: Mod assist Stand pivot transfers: Mod assist       General transfer comment: Mod assist to stand to RW and transfer to [facility] and then recliner chair.  Pt impulsive and disreguarding cues for safety (keeping both hands on RW, sitting prematurely).  Needs near constant cues for safety.    Ambulation/Gait             General Gait Details: Pt is not ready for gait progression and will need two people to do so safely.           Balance Overall balance assessment: Needs assistance Sitting-balance support: Feet supported;Bilateral upper extremity supported Sitting balance-Leahy Scale: Fair Sitting balance - Comments: close supervision EOB.    Standing balance support: Bilateral upper extremity supported;During functional activity Standing balance-Leahy Scale: Poor Standing balance comment: needs support from RW and therapist.                             Cognition Arousal/Alertness: Awake/alert Behavior During Therapy: Impulsive Overall Cognitive Status: Impaired/Different from baseline Area of Impairment: Orientation;Attention;Following commands;Safety/judgement;Awareness;Problem solving                 Orientation Level: Disoriented to;Time Current Attention Level: Sustained Memory: Decreased recall of precautions Following Commands: Follows  one step commands inconsistently Safety/Judgement: Decreased awareness of safety;Decreased awareness of deficits Awareness: Emergent Problem Solving: Difficulty sequencing;Requires verbal cues;Requires tactile cues General Comments: Pt is very impulsive, not listening to my cues for safety, moving quickly and  sitting prematurely before it is safe to do so. Holding RW and 3-in-1 despite strong cues to hold the walker.  Pt able to ID her husband and tell me why she was here and that she was on rehab before.              Pertinent Vitals/Pain Pain Assessment: Faces Faces Pain Scale: Hurts little more Pain Location: back and legs  Pain Descriptors / Indicators: Grimacing;Guarding Pain Intervention(s): Limited activity within patient's tolerance;Monitored during session;Repositioned           PT Goals (current goals can now be found in the care plan section) Acute Rehab PT Goals Patient Stated Goal: decrease pain Progress towards PT goals: Progressing toward goals    Frequency    Min 5X/week      PT Plan Current plan remains appropriate       AM-PAC PT "6 Clicks" Mobility   Outcome Measure  Help needed turning from your back to your side while in a flat bed without using bedrails?: A Little Help needed moving from lying on your back to sitting on the side of a flat bed without using bedrails?: A Little Help needed moving to and from a bed to a chair (including a wheelchair)?: A Lot Help needed standing up from a chair using your arms (e.g., wheelchair or bedside chair)?: A Lot Help needed to walk in hospital room?: A Lot Help needed climbing 3-5 steps with a railing? : Total 6 Click Score: 13    End of Session Equipment Utilized During Treatment: Gait belt Activity Tolerance: Patient limited by pain Patient left: in chair;with call bell/phone within reach;with chair alarm set;with family/visitor present   PT Visit Diagnosis: Unsteadiness on feet (R26.81);Difficulty in walking, not elsewhere classified (R26.2);Muscle weakness (generalized) (M62.81)     Time: 4356-8616 PT Time Calculation (min) (ACUTE ONLY): 25 min  Charges:  $Therapeutic Activity: 23-37 mins                    Kaitlin Vargas, PT, DPT  Acute Rehabilitation [phone] pager #[phone]  office   07/13/2018, 10:25 AM

## 2018-07-13 NOTE — H&P (View-Only) (Signed)
PROGRESS NOTE    Kaitlin Vargas  TGG:[id number] DOB: [birth year] DOA: 07/10/2018 PCP: [name], MD   Brief Narrative: Kaitlin Vargas is a 68 y.o. femalewith medical history significant ofhypertension, hyperlipidemia, B12 deficiency, type 2 diabetes currently on insulin. Patient is being treated for MSSA bacteremia and developed worsening back pain found to have worsening abscess, s/p surgery per neurosurgery.   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 -ID recommendations: Continue Cefazolin and add Rifampin  Spinal cord abscess S/p lumbar laminectomy, posterior fixation, arthrodesis on 2/21. Cultures obtained on 2/21 -Neurosurgery recommendations  Essential hypertension -Continue metoprolol and lisinopril  Tachycardia Sinus. Hemoglobin of 7.6 s/p surgery. Did not improve with blood transfusion. Sinus rhythm on telemetry -Continue home metoprolol -EKG  Acute blood loss anemia In setting of surgery. S/p 2 units of PRBC on 2/22. Hemoglobin improved from 7.6 to 10.0  Diabetes mellitus, type 2 -Continue SSI -Continue Lantus 20 units; may need to increase if blood sugar continues to be elevated  Increased nutrition needs -Continue protein supplement  Chronic pain Continue MS contin  Confusion Slightly today. Unsure of etiology. Could be related to narcotics -BMP today   DVT prophylaxis: SCDs Code Status:   Code Status: Full Code Family Communication: None at bedside Disposition Plan: Discharge pending infectious workup   Consultants:   Neurosurgery  Infectious disease  Procedures:  1. Decompressive lumbar laminectomy lumbar 1-2 left with hemi-facetectomy and sublaminar  decompression and discectomy.  Disc was sent for biopsy and culture; Posterior fixation T10-L3 inclusive using Alphatec cortical pedicle screws; Intertransverse arthrodesis T10-L3 using morcellized autograft and allograft soaked with a bone marrow aspirate obtained through a separate fascial incision over the left iliac crest. (07/11/2018)  Antimicrobials:  Cefazolin  Rifampin    Subjective: No concerns today.  Objective: Vitals:   07/12/18 1915 07/12/18 2100 07/12/18 2354 07/13/18 0300  BP: 113/60 136/67 130/65 134/72  Pulse: 99 (!) 107 (!) 105 (!) 106  Resp: 18 (!) 22 20 20   Temp: 98.4 F (36.9 C) 99.1 F (37.3 C) 99 F (37.2 C) 99.5 F (37.5 C)  TempSrc: Oral Oral Oral Oral  SpO2: 99% 98% 97% 98%  Weight:      Height:        Intake/Output Summary (Last 24 hours) at 07/13/2018 0916 Last data filed at 07/13/2018 0300 Gross per 24 hour  Intake 1619.96 ml  Output 1390 ml  Net 229.96 ml   Filed Weights   07/11/18 1433  Weight: 72.6 kg    Examination:  General exam: Appears calm and comfortable Respiratory system: Clear to auscultation. Respiratory effort normal. Cardiovascular system: S1 & S2 heard, tachycardia, normal rhythm. No murmurs, rubs, gallops or clicks. Gastrointestinal system: Abdomen is nondistended, soft and nontender. No organomegaly or masses felt. Normal bowel sounds heard. Central nervous system: Alert. No focal neurological deficits. Extremities: No edema. No calf tenderness Skin: No cyanosis. No rashes Psychiatry: Judgement and insight appear normal. Flat affect, depressed mood.      Data Reviewed: I have personally reviewed following labs and imaging studies  CBC: Recent Labs  Lab 07/08/18 0515 07/12/18 0722 07/13/18 0613  WBC 4.6 6.1 6.7  HGB 10.0* 7.6* 10.0*  HCT 32.5* 25.6*  30.9*  MCV 102.2* 104.5* 95.7  PLT 269 301 188   Basic Metabolic Panel: Recent Labs  Lab 07/08/18 0515 07/12/18 1143  NA 135 130*  K 3.5 4.5  CL 97* 94*    CO2 29 27  GLUCOSE 108* 439*  BUN 15 17  CREATININE 0.60 0.65  CALCIUM 8.3* 8.3*   GFR: Estimated Creatinine Clearance: 56.2 mL/min (by C-G formula based on SCr of 0.65 mg/dL). Liver Function Tests: Recent Labs  Lab 07/08/18 0515  AST 14*  ALT <5  ALKPHOS 121  BILITOT 0.5  PROT 5.7*  ALBUMIN 1.9*   No results for input(s): LIPASE, AMYLASE in the last 168 hours. No results for input(s): AMMONIA in the last 168 hours. Coagulation Profile: No results for input(s): INR, PROTIME in the last 168 hours. Cardiac Enzymes: No results for input(s): CKTOTAL, CKMB, CKMBINDEX, TROPONINI in the last 168 hours. BNP (last 3 results) No results for input(s): PROBNP in the last 8760 hours. HbA1C: No results for input(s): HGBA1C in the last 72 hours. CBG: Recent Labs  Lab 07/12/18 1128 07/12/18 1542 07/12/18 1749 07/12/18 2125 07/13/18 0819  GLUCAP 375* 404* 272* 171* 243*   Lipid Profile: No results for input(s): CHOL, HDL, LDLCALC, TRIG, CHOLHDL, LDLDIRECT in the last 72 hours. Thyroid Function Tests: No results for input(s): TSH, T4TOTAL, FREET4, T3FREE, THYROIDAB in the last 72 hours. Anemia Panel: No results for input(s): VITAMINB12, FOLATE, FERRITIN, TIBC, IRON, RETICCTPCT in the last 72 hours. Sepsis Labs: No results for input(s): PROCALCITON, LATICACIDVEN in the last 168 hours.  Recent Results (from the past 240 hour(s))  Urine Culture     Status: Abnormal   Collection Time: 07/06/18  8:37 AM  Result Value Ref Range Status   Specimen Description URINE, RANDOM  Final   Special Requests NONE  Final   Culture (A)  Final    <10,000 COLONIES/mL INSIGNIFICANT GROWTH Performed at [facility], [address] [place], [place] [address]    Report Status 07/07/2018 FINAL  Final  Aerobic/Anaerobic Culture (surgical/deep wound)     Status: None (Preliminary result)   Collection Time: 07/11/18  4:57 PM  Result Value Ref Range Status   Specimen Description TISSUE  Final    Special Requests L1 2 DISC SPACE  Final   Gram Stain NO WBC SEEN NO ORGANISMS SEEN   Final   Culture   Final    RARE STAPHYLOCOCCUS AUREUS CULTURE REINCUBATED FOR BETTER GROWTH CRITICAL RESULT CALLED TO, READ BACK BY AND VERIFIED WITH: C. MITCHELL,  RN CONCERNING CULTURE GROWTH AT 6301 ON 07/12/18 BY C. JESSUP, MLT. Performed at [facility], [place] [address] [place], [place] [address]    Report Status PENDING  Incomplete  Aerobic/Anaerobic Culture (surgical/deep wound)     Status: None (Preliminary result)   Collection Time: 07/11/18  4:58 PM  Result Value Ref Range Status   Specimen Description WOUND  Final   Special Requests L1 2 DISC SPACE  Final   Gram Stain   Final    RARE WBC PRESENT, PREDOMINANTLY PMN NO ORGANISMS SEEN Performed at [facility], [place] [address], [place], [place] [address]    Culture NO GROWTH < 24 HOURS  Final   Report Status PENDING  Incomplete         Radiology Studies: Dg Thoracolumabar Spine  Result Date: 07/11/2018 CLINICAL DATA:  Thoracolumbar fusion.  Spinal infection. EXAM: THORACOLUMBAR SPINE 1V COMPARISON:  Lumbar MRI 07/09/2018 FINDINGS: C-arm images were obtained  of the thoracic and lumbar spine. Bilateral pedicle screw and posterior rod fusion extending from T10 through L3. Endplate erosion at N3-6 consistent with infection IMPRESSION: Pedicle screw and posterior rod fusion T10 through L3. Electronically Signed   By: Franchot Gallo M.D.   On: 07/11/2018 19:30   Dg C-arm 1-60 Min  Result Date: 07/11/2018 CLINICAL DATA:  Thoracolumbar fusion.  Spinal infection. EXAM: THORACOLUMBAR SPINE 1V COMPARISON:  Lumbar MRI 07/09/2018 FINDINGS: C-arm images were obtained of the thoracic and lumbar spine. Bilateral pedicle screw and posterior rod fusion extending from T10 through L3. Endplate erosion at R4-4 consistent with infection IMPRESSION: Pedicle screw and posterior rod fusion T10 through L3. Electronically Signed   By: Franchot Gallo  M.D.   On: 07/11/2018 19:30        Scheduled Meds: . aspirin  81 mg Oral q morning - 10a  . bethanechol  10 mg Oral TID  . feeding supplement (GLUCERNA SHAKE)  237 mL Oral TID WC  . feeding supplement (PRO-STAT SUGAR FREE 64)  30 mL Oral BID  . ferrous sulfate  325 mg Oral Q breakfast  . furosemide  40 mg Oral Daily  . insulin aspart  0-15 Units Subcutaneous TID WC  . insulin glargine  20 Units Subcutaneous Daily  . ketoconazole   Topical BID  . lisinopril  10 mg Oral Daily  . metoprolol tartrate  25 mg Oral BID  . morphine  30 mg Oral Q12H  . multivitamin with minerals  1 tablet Oral Daily  . pantoprazole  40 mg Oral Daily  . rifampin  300 mg Oral Q12H  . senna  1 tablet Oral BID  . sodium chloride flush  10-40 mL Intracatheter Q12H  . sodium chloride flush  3 mL Intravenous Q12H  . vitamin B-12  1,000 mcg Oral Daily   Continuous Infusions: . sodium chloride    . 0.9 % NaCl with KCl 20 mEq / L 100 mL/hr at 07/12/18 2352  .  ceFAZolin (ANCEF) IV 2 g (07/13/18 0640)  .  ceFAZolin (ANCEF) IV 2 g (07/13/18 0543)  . methocarbamol (ROBAXIN) IV       LOS: 3 days     Cordelia Poche, MD [facility] 07/13/2018, 9:16 AM  If 7PM-7AM, please contact night-coverage [email or web address]

## 2018-07-13 NOTE — Progress Notes (Signed)
  NEUROSURGERY PROGRESS NOTE   No issues overnight.  Appears more confused this am. Complains of mild back soreness particular where drain is   EXAM:  BP 134/72 (BP Location: Left Arm)   Pulse (!) 106   Temp 99.5 F (37.5 C) (Oral)   Resp 20   Ht 4\' 10"  (1.473 m)   Wt 72.6 kg   SpO2 98%   BMI 33.44 kg/m   Awake, alert Follows commands Confused at times, repeats what I ask CN grossly intact  MAEW with symmetric strength, non-focal exam Drain: 40cc past 24 hours. Incision wihtout active drainage   PLAN Appears more confused this am. Nursing to discuss with primary team. Otherwise, motor exam appears stable. Can remove drain due to minimal outpt in the past 24 hours Continue current care per [facility] and ID

## 2018-07-14 LAB — CBC
HCT: 30.9 % — ABNORMAL LOW (ref 36.0–46.0)
Hemoglobin: 9.9 g/dL — ABNORMAL LOW (ref 12.0–15.0)
MCH: 31.1 pg (ref 26.0–34.0)
MCHC: 32 g/dL (ref 30.0–36.0)
MCV: 97.2 fL (ref 80.0–100.0)
Platelets: 304 10*3/uL (ref 150–400)
RBC: 3.18 MIL/uL — ABNORMAL LOW (ref 3.87–5.11)
RDW: 18.7 % — ABNORMAL HIGH (ref 11.5–15.5)
WBC: 6.3 10*3/uL (ref 4.0–10.5)
nRBC: 0 % (ref 0.0–0.2)

## 2018-07-14 LAB — BASIC METABOLIC PANEL
Anion gap: 6 (ref 5–15)
BUN: 9 mg/dL (ref 8–23)
CO2: 25 mmol/L (ref 22–32)
Calcium: 8.1 mg/dL — ABNORMAL LOW (ref 8.9–10.3)
Chloride: 100 mmol/L (ref 98–111)
Creatinine, Ser: 0.45 mg/dL (ref 0.44–1.00)
GFR calc Af Amer: >60 mL/min (ref >60)
Glucose, Bld: 199 mg/dL — ABNORMAL HIGH (ref 70–99)
Potassium: 3.9 mmol/L (ref 3.5–5.1)
Sodium: 131 mmol/L — ABNORMAL LOW (ref 135–145)

## 2018-07-14 LAB — GLUCOSE, CAPILLARY
Glucose-Capillary: 197 mg/dL — ABNORMAL HIGH (ref 70–99)
Glucose-Capillary: 198 mg/dL — ABNORMAL HIGH (ref 70–99)
Glucose-Capillary: 268 mg/dL — ABNORMAL HIGH (ref 70–99)
Glucose-Capillary: 114 mg/dL — ABNORMAL HIGH (ref 70–99)

## 2018-07-14 LAB — AMMONIA: Ammonia: 21 umol/L (ref 9–35)

## 2018-07-14 MED ORDER — HYDROCODONE-ACETAMINOPHEN 7.5-325 MG PO TABS
1.0000 | ORAL_TABLET | Freq: Four times a day (QID) | ORAL | Status: DC | PRN
  Administered 2018-07-14 – 2018-07-16 (×5): 1 via ORAL
  Filled 2018-07-14 (×5): qty 1

## 2018-07-14 MED ORDER — ONDANSETRON 4 MG PO TBDP
4.0000 mg | ORAL_TABLET | Freq: Two times a day (BID) | ORAL | Status: DC
  Administered 2018-07-14 – 2018-07-16 (×4): 4 mg via ORAL
  Filled 2018-07-14 (×5): qty 1

## 2018-07-14 NOTE — Progress Notes (Signed)
   Kaitlin Vargas has been requested to perform a transesophageal echocardiogram on [name] Kaitlin Vargas for bacteremia.  After careful review of history and examination, the risks and benefits of transesophageal echocardiogram have been explained including risks of esophageal damage, perforation (1:10,000 risk), bleeding, pharyngeal hematoma as well as other potential complications associated with conscious sedation including aspiration, arrhythmia, respiratory failure and death. Alternatives to treatment were discussed, questions were answered. Patient is willing to proceed.   Procedure is scheduled for 07/15/2018 at 9:30am with Dr. Stanford Breed.   Darreld Mclean, PA-C 07/14/2018 12:33 PM

## 2018-07-14 NOTE — PMR Pre-admission (Signed)
PMR Admission Coordinator Pre-Admission Assessment  Patient: Kaitlin Vargas is an 68 y.o., female MRN: [id number] DOB: 03-Nov-1950 Height: '4\' 10"'  (147.3 cm) Weight: 72.6 kg  Insurance Information HMO: yes    PPO:      PCP:      IPA:      80/20:      OTHER: medicare advantage plan PRIMARY: [employer]      Policy#: [id number]      Subscriber: pt CM Name: Dot   Phone#: [phone] ext [phone]     Fax#: [phone] Pre-Cert#: [id number] for & days with updates due in 7 days to [facility] ext [phone]      Employer: retired Benefits:  Phone #: [phone]     Name: 07/14/2018 Eff. Date: 05/21/2018     Deduct: none      Out of Pocket Max: $3400      Life Max: none CIR: $295 co pay per day days 1 until 6      SNF: no co pay days 1 until 20: $178 co pay per day days 21 until 100 Outpatient: $40 co pay per visit     Co-Pay: visits per medical neccesity Home Health: 100%      Co-Pay: visits per medical neccesity DME: 80%     Co-Pay: 20% Providers: in network  SECONDARY: none       Medicaid Application Date:       Case Manager:  Disability Application Date:       Case Worker:   Emergency Facilities manager Information    Name Relation Home Work [place], [place] Spouse   [phone]      Current Medical History  Patient Admitting Diagnosis: Lumbar osteomyelitis; MSSA   History of Present Illness: Kaitlin Vargas a 68 year old right-handed female with history of hypertension, hyperlipidemia, B12 deficiency and diabetes mellitus. Patient with reported back pain 4 weeks. She continued to have back pain radiating to the lower extremities as well as bouts of urinary incontinence and stool incontinence. She presented to [facility] on 06/16/2018 with increasing back pain with lower extremity weakness and a CT scan of the spine showed what appeared to be osteomyelitis. She was transferred to [facility] for interventional radiology completed lumbar puncture and during the  procedure noted increase active bleeding as well as cardiac arrest requiring CPR 90 seconds. She did not require intubation. It was felt cardiac arrest likely due to respiratory arrest following sedation. 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 Patient also with right foot ulceration. MRI of the foot negative for acute bony or joint abnormality. Small volume of fluid in the subcutaneous tissue deep to the first TMT joint. Infectious disease consulted patient with GPCs growing from lumbar spine aspirate. Placed on antibiotic therapy. TEE negative for vegetation. She had been placed on Cefazolin: Per infectious disease through 08/13/2018.subcutaneous heparin for DVT prophylaxis. Hospital course of pain management maintained on long-acting MS Contin.Acute on chronic anemia 7.6 transfused 2 units of packed red blood cells 07/12/2018 and monitored closely initial bouts of hyponatremia 130. Therapy evaluations completed and patient was admitted for a comprehensive rehabilitation program to 06/26/2018.   Patient was progressing nicely at [facility] and developed increasing back pain 07/09/2018. 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 Also showed prevertebral abscess. Infectious disease as well as neurosurgery consulted and plan was to discharge to acute care  services for ongoing care 07/10/2018. After long discussion between neurosurgery and infectious disease it was decided to explore abscess as patient appeared not to be responding to antibiotics and underwent decompressive lumbar laminectomy lumbar 1-2 left with hemi-facetectomy and sublaminar decompression and discectomy. Disc was sent for biopsy and culture. Posterior fixation T10-L3 using cortical pedicle screws. Intertransverse arthrodesis T10-L3 with bone marrow aspirate obtained through a separate  fascial incision over the left iliac crest 07/11/2018 per Dr. Sherley Bounds. Back brace when out of bed applied in sitting position.Pain management with the use of MS Contin monitoring mental status for confusion. Infectious disease follow-up current plan is for 8 weeks of IV cefazolin plus oral rifampin 300 mg twice a day from date of surgery 07/11/2018. 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 Patient with bouts of urinary retention currently maintained on Urecholine.    Patient's medical record from [facility] has been reviewed by the rehabilitation admission coordinator and physician.   Past Medical History  Past Medical History:  Diagnosis Date  . Anemia   . Diabetes mellitus without complication ([place])   . Hyperlipidemia   . Hypertension   . Peripheral neuropathy     Family History   family history includes Cancer in her father and mother; Leukemia in her father; Stroke in her mother.  Prior Rehab/Hospitalizations Has the patient had major surgery during 100 days prior to admission? YesCIR 06/26/2018 until 07/10/2018 when readmitted to acute for surgery    Current Medications  Current Facility-Administered Medications:  .  0.9 %  sodium chloride infusion, 250 mL, Intravenous, Continuous, Crenshaw, Aaron Edelman S, MD .  0.9 % NaCl with KCl 20 mEq/ L  infusion, , Intravenous, Continuous, Crenshaw, Denice Bors, MD, Last Rate: 50 mL/hr at 07/16/18 0900 .  acetaminophen (TYLENOL) tablet 650 mg, 650 mg, Oral, Q4H PRN, 650 mg at 07/12/18 1428 **OR** acetaminophen (TYLENOL) suppository 650 mg, 650 mg, Rectal, Q4H PRN, Lelon Perla, MD .  albuterol (PROVENTIL) (2.5 MG/3ML) 0.083% nebulizer solution 2.5 mg, 2.5 mg,  Nebulization, Q2H PRN, Lelon Perla, MD .  aspirin chewable tablet 81 mg, 81 mg, Oral, q morning - 10a, Lelon Perla, MD, 81 mg at 07/16/18 0906 .  bethanechol (URECHOLINE) tablet 10 mg, 10 mg, Oral, TID, Lelon Perla, MD, 10 mg at 07/16/18 0906 .  bisacodyl (DULCOLAX) suppository 10 mg, 10 mg, Rectal, Daily PRN, Lelon Perla, MD .  ceFAZolin (ANCEF) IVPB 2g/100 mL premix, 2 g, Intravenous, Q8H, Lelon Perla, MD, Stopped at 07/16/18 [phone] .  feeding supplement (GLUCERNA SHAKE) (GLUCERNA SHAKE) liquid 237 mL, 237 mL, Oral, TID WC, Lelon Perla, MD, 237 mL at 07/16/18 1141 .  feeding supplement (PRO-STAT SUGAR FREE 64) liquid 30 mL, 30 mL, Oral, BID, Lelon Perla, MD, 30 mL at 07/16/18 0907 .  ferrous sulfate tablet 325 mg, 325 mg, Oral, Q breakfast, Crenshaw, Denice Bors, MD, 325 mg at 07/16/18 7035 .  furosemide (LASIX) tablet 40 mg, 40 mg, Oral, Daily, Lelon Perla, MD, 40 mg at 07/16/18 0907 .  HYDROcodone-acetaminophen (NORCO) 7.5-325 MG per tablet 1 tablet, 1 tablet, Oral, Q6H PRN, Lelon Perla, MD, 1 tablet at 07/15/18 2135 .  insulin aspart (novoLOG) injection 0-15 Units, 0-15 Units, Subcutaneous, TID WC,  Lelon Perla, MD, 2 Units at 07/16/18 1150 .  insulin glargine (LANTUS) injection 20 Units, 20 Units, Subcutaneous, Daily, Lelon Perla, MD, 20 Units at 07/16/18 0908 .  ketoconazole (NIZORAL) 2 % cream, , Topical, BID, Crenshaw, Denice Bors, MD .  lisinopril (PRINIVIL,ZESTRIL) tablet 10 mg, 10 mg, Oral, Daily, Lelon Perla, MD, 10 mg at 07/16/18 0907 .  menthol-cetylpyridinium (CEPACOL) lozenge 3 mg, 1 lozenge, Oral, PRN **OR** phenol (CHLORASEPTIC) mouth spray 1 spray, 1 spray, Mouth/Throat, PRN, Stanford Breed, Denice Bors, MD .  methocarbamol (ROBAXIN) tablet 500 mg, 500 mg, Oral, Q6H PRN, 500 mg at 07/15/18 2136 **OR** methocarbamol (ROBAXIN) 500 mg in dextrose 5 % 50 mL IVPB, 500 mg, Intravenous, Q6H PRN, Lelon Perla, MD .  metoprolol  tartrate (LOPRESSOR) tablet 25 mg, 25 mg, Oral, BID, Lelon Perla, MD, 25 mg at 07/16/18 0907 .  morphine (MS CONTIN) 12 hr tablet 15 mg, 15 mg, Oral, Q12H, Lelon Perla, MD, 15 mg at 07/16/18 0906 .  multivitamin with minerals tablet 1 tablet, 1 tablet, Oral, Daily, Lelon Perla, MD, 1 tablet at 07/16/18 0907 .  ondansetron (ZOFRAN) tablet 4 mg, 4 mg, Oral, Q6H PRN **OR** ondansetron (ZOFRAN) injection 4 mg, 4 mg, Intravenous, Q6H PRN, Lelon Perla, MD .  ondansetron (ZOFRAN-ODT) disintegrating tablet 4 mg, 4 mg, Oral, BID, Lelon Perla, MD, 4 mg at 07/16/18 [phone]  pantoprazole (PROTONIX) EC tablet 40 mg, 40 mg, Oral, Daily, Lelon Perla, MD, 40 mg at 07/16/18 0906 .  polyethylene glycol (MIRALAX / GLYCOLAX) packet 17 g, 17 g, Oral, BID, Samtani, Jai-Gurmukh, MD .  rifampin (RIFADIN) capsule 300 mg, 300 mg, Oral, TID, Carlyle Basques, MD .  senna (SENOKOT) tablet 8.6 mg, 1 tablet, Oral, BID, Lelon Perla, MD, 8.6 mg at 07/16/18 0906 .  sodium chloride flush (NS) 0.9 % injection 10-40 mL, 10-40 mL, Intracatheter, Q12H, Lelon Perla, MD, 10 mL at 07/16/18 0904 .  sodium chloride flush (NS) 0.9 % injection 10-40 mL, 10-40 mL, Intracatheter, PRN, Lelon Perla, MD .  sodium chloride flush (NS) 0.9 % injection 3 mL, 3 mL, Intravenous, Q12H, Lelon Perla, MD, 3 mL at 07/16/18 0904 .  sodium chloride flush (NS) 0.9 % injection 3 mL, 3 mL, Intravenous, PRN, Stanford Breed, Denice Bors, MD .  sodium phosphate (FLEET) 7-19 GM/118ML enema 1 enema, 1 enema, Rectal, Daily PRN, Lelon Perla, MD, 1 enema at 07/16/18 1132 .  sorbitol 70 % solution 30 mL, 30 mL, Oral, Daily PRN, Lelon Perla, MD .  vitamin B-12 (CYANOCOBALAMIN) tablet 1,000 mcg, 1,000 mcg, Oral, Daily, Stanford Breed Denice Bors, MD, 1,000 mcg at 07/16/18 0907  Patients Current Diet:   Diet Order            Diet - low sodium heart healthy        Diet heart healthy/carb modified Room service appropriate?  Yes with Assist; Fluid consistency: Thin  Diet effective now              Precautions / Restrictions Precautions Precautions: Fall, Back Precaution Booklet Issued: Yes (comment) Precautions/Special Needs: Orthotics/Bracing Precaution Comments: reviewed Spinal Brace: Applied in sitting position Other Brace: JP drain in back Restrictions Weight Bearing Restrictions: No Other Position/Activity Restrictions: brace not available at time of eval(RN reports she plans to follow up with orthotech )   Has the patient had 2 or more falls or a fall with injury in the past year?No  Prior  Activity Level Community (5-7x/wk): was not driving dut to vision issues. Needed RW and cane due to peripheral neuropathy  Prior Functional Level Do you want Prior Function Level of Independence: Independent with assistive device(s) from other? Self Care: Did the patient need help bathing, dressing, using the toilet or eating?  Independent  Indoor Mobility: Did the patient need assistance with walking from room to room (with or without device)? Independent  Stairs: Did the patient need assistance with internal or external stairs (with or without device)? Independent  Functional Cognition: Did the patient need help planning regular tasks such as shopping or remembering to take medications? [place] / [place] Devices/Equipment: Environmental consultant (specify type), Cane (specify quad or straight) Home Equipment: Walker - 2 wheels, Cane - single point  Prior Device Use: Indicate devices/aids used by the patient prior to current illness, exacerbation or injury? Walker  Prior Functional Level     Prior Functional Level Current Functional Level  Bed Mobility  Independent  min assist  Transfers Independent  Mod assist to power up and block feet from sliding  Mobility - Walk/Wheelchair Mod I with RW and cane    Mobility - Ambulation/Gait  Mod assist, +2 safety/equipment 65  feet with RW. Very shaky and required mod assist for walker management, and max directional VCs  Upper Body Dressing  Independent Minimal assistance, Sitting  Lower Body Dressing Independent Total assistance, +2 for physical assistance, Sit to/from stand  Grooming Independent Oral care, Wash/dry face, Brushing hair, Set up, Supervision/safety, Sitting  Eating/Drinking Independent   set up  Toilet Transfer Independent +2 for physical assistance, +2 for safety/equipment, Stand-pivot, RW, Maximal assistance  Bladder Continence continent    Bowel Management  continent    Stair Climbing Mod I   not attempted  Communication independent No difficulties  Memory intact Decreased recall of precautions  Cooking/Meal Prep  independent     Housework  Daughter assist   Money Management  independent   Driving  Did not drive due to vision issues     Special needs/care consideration BiPAP/CPAP n/a CPM n/a Continuous Drip IV PICC line Dialysis n/a Life Vest n/a Oxygen n/a Special Bed n/a Trach Size n/a Wound Vac  N/a Skin surgical incision with dressing; arms and feet with ecchymosis; moisture associated skin damage to perineum Bowel mgmt: incontinent LBM; patient complains of constipation on day of admit requesting fleets enema Bladder mgmt: external catheter Diabetic mgmt Hgb A1c 8.5 on 06/07/2018 Cefazolin via PICC with end date 09/05/2018   Tenna Child, RN  Registered Nurse  WOC  Consult Note  Addendum  Date of Service:  07/03/2018 2:11 PM       Related encounter: Admission (Discharged) from 06/26/2018 in [place] all '[x]' Manual'[x]' Template'[x]' Copied  Added by: '[x]' Tenna Child, RN  '[]' Hover for details [place] Nurse wound follow up Previous WOC consult was performed on 2/7; right foot wound has greatly improved since that time. Previous chronic, nonhealing ulcer (full thickness) at lateral right foot with macerated  callous. Patient states she has seen a podiatrist in the distant past for this problem. Wound type: Right foot with healing wound; no further depth or callous, dry peeling yellow skin lifted and removed easily, revealing partial thickness wound .2X.2X.1cm, pink and dry, surrounded by dark reddish-purple intact skin.  No odor or drainage.   Dressing procedure/placement/frequency: Discontinue use of xeroform; foam dressing applied to  protect from further injury.  Discussed plan of care with patient. Please re-consult if further assistance is needed.  Thank-you,  Julien Girt MSN, RN, [place], [place], CNS [phone]     Revision History                          Previous Home Environment Living Arrangements: Spouse/significant other  Lives With: Spouse, Daughter Available Help at Discharge: Family, Available 24 hours/day Type of Home: House Home Layout: One level Home Access: Ramped entrance Bathroom Shower/Tub: Chiropodist: Standard Bathroom Accessibility: Yes How Accessible: Accessible via walker [place]: No Additional Comments: Ambulated with cane and RW PTA  Discharge Living Setting Plans for Discharge Living Setting: Patient's home, Lives with (comment) Type of Home at Discharge: House Discharge Home Layout: One level Discharge Home Access: [place] entrance Discharge Bathroom Shower/Tub: Tub/shower unit, Curtain Discharge Bathroom Toilet: Standard Discharge Bathroom Accessibility: Yes How Accessible: Accessible via walker Does the patient have any problems obtaining your medications?: No  Social/Family/Support Systems Patient Roles: Spouse, Parent Contact Information: spouse,  John Anticipated Caregiver: Husband and Chemical engineer Information: see above Ability/Limitations of Caregiver: Husband is retired and Programmer, systems works third shift Caregiver Availability: 24/7 Discharge Plan Discussed with Primary Caregiver: Yes Is  Caregiver In Agreement with Plan?: Yes Does Caregiver/Family have Issues with Lodging/Transportation while Pt is in Rehab?: No  Goals/Additional Needs Patient/Family Goal for Rehab: supervision to min asisst with PT and OT Expected length of stay: ELOS 10 to 14 days Equipment Needs: IV antibiotics at d/c via PICC Pt/Family Agrees to Admission and willing to participate: Yes Program Orientation Provided & Reviewed with Pt/Caregiver Including Roles  & Responsibilities: Yes  Patient Condition: I have reviewed [facility] , spoken with patient and spouse.I met with patient at the bedside for inpatient rehabilitation assessment.  Patient will benefit from ongoing PT and OT, can actively participate in 3 hours of therapy a day 5 days of the week, and can make measurable gains during the admission.  Patient will also benefit from the coordinated team approach during an Inpatient Acute Rehabilitation admission.  The patient will receive intensive therapy as well as Rehabilitation physician, nursing, social worker, and care management interventions.  Due to bowel management, bladder management, safety, skin/wound care, disease management, medical administration, pain management, patient education the patient requires 24 hour a day rehabilitation nursing.  The patient is currently min to mod assist with mobility and basic ADLs.  Discharge setting and therapy post discharge at  home with home health is anticipated.  Patient has agreed to participate in the Acute Inpatient Rehabilitation Program and will admit today.  Preadmission Screen Completed By:  Cleatrice Burke RN MSN, 07/16/2018 12:44 PM ______________________________________________________________________   Discussed status with Dr. Posey Pronto  on  07/16/2018 at  1243 and received telephone approval for admission today.  Admission Coordinator:  Cleatrice Burke RN MSN, time [id number] Date  07/16/2018    Assessment/Plan: Diagnosis: Lumbar osteomyelitis; MSSA   1. Does the need for close, 24 hr/day  Medical supervision in concert with the patient's rehab needs make it unreasonable for this patient to be served in a less intensive setting? Yes  2. Co-Morbidities requiring supervision/potential complications: hypertension, hyperlipidemia, B12 deficiency and diabetes mellitus 3. Due to safety, disease management, pain management and patient education, does the patient require 24 hr/day rehab nursing? Yes 4. Does the patient require coordinated care of a physician, rehab  nurse, PT (1-2 hrs/day, 5 days/week) and OT (1-2 hrs/day, 5 days/week) to address physical and functional deficits in the context of the above medical diagnosis(es)? Yes Addressing deficits in the following areas: balance, endurance, locomotion, strength, transferring, bathing, dressing, toileting and psychosocial support 5. Can the patient actively participate in an intensive therapy program of at least 3 hrs of therapy 5 days a week? Yes 6. The potential for patient to make measurable gains while on inpatient rehab is excellent 7. Anticipated functional outcomes upon discharge from inpatients are: supervision and min assist PT, supervision and min assist OT, n/a SLP 8. Estimated rehab length of stay to reach the above functional goals is: 14-17 days. 9. Anticipated D/C setting: Home 10. Anticipated post D/C treatments: HH therapy and Home excercise program 11. Overall Rehab/Functional Prognosis: good  Delice Lesch, MD, ABPMR Julious Payer Audelia Acton RN MSN Admissions Coordinator 07/16/2018

## 2018-07-14 NOTE — Progress Notes (Signed)
Physical Therapy Treatment Patient Details Name: Kaitlin Vargas MRN: [id number] DOB: [birth year] Today's Date: 07/14/2018    History of Present Illness Pt is a 68 y.o. female who was admitted to [facility] 06/16/18 with back pain. 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 She transferred to [facility] for lumbar puncture on 1/29; during procedure, pt with active bleeding and cardiac arrest requiring CPR.  On 06/26/2018 she was transferred to inpatient rehab to continue inpatient rehab with IV antibiotics and PICC line on right arm. Abscess not responding to antibiotics. Pt with continued pain and inability to ambulate. On 07/10/18 pt re-admitted to acute care. She underwent posterior fixation T10-L3 07/11/18.  Post op anemia requiring PRBCs on 07/12/18.  PMH includes peripheral neuropathy, HTN, DM.      PT Comments    Pt improved cognitively today compared to yesterday. Pt shaky and very unsteady during ambulation requiring modAx2 for safe ambulation. Pt with inc HR to 147bpm during ambulation resulting in patient having to sit. Acute PT to cont to follow.    Follow Up Recommendations  CIR     Equipment Recommendations  3in1 (PT);Wheelchair (measurements PT);Wheelchair cushion (measurements PT)    Recommendations for Other Services Rehab consult     Precautions / Restrictions Precautions Precautions: Fall;Back Precaution Comments: reviewed precautions Required Braces or Orthoses: Spinal Brace;Other Brace Spinal Brace: Applied in sitting position Restrictions Weight Bearing Restrictions: No    Mobility  Bed Mobility Overal bed mobility: Needs Assistance Bed Mobility: Rolling;Sidelying to Sit Rolling: Min assist Sidelying to sit: Min assist       General bed mobility comments: pt initiated well, increased time, minA to complete tast  Transfers Overall transfer level: Needs assistance Equipment used: Rolling walker (2 wheeled) Transfers: Sit to/from Stand Sit to Stand: Mod  assist         General transfer comment: mod assist to power up and block feet from sliding out under her,  Ambulation/Gait Ambulation/Gait assistance: Mod assist;+2 safety/equipment Gait Distance (Feet): 65 Feet Assistive device: Rolling walker (2 wheeled) Gait Pattern/deviations: Step-through pattern Gait velocity: dec Gait velocity interpretation: <1.31 ft/sec, indicative of household ambulator General Gait Details: pt very shaky, requiring modA for walker management, and max diretional v/c's to contiuing stepping insteady of only pushing walker forward   Stairs             Wheelchair Mobility    Modified Rankin (Stroke Patients Only)       Balance Overall balance assessment: Needs assistance Sitting-balance support: Feet supported;Bilateral upper extremity supported Sitting balance-Leahy Scale: Fair     Standing balance support: Bilateral upper extremity supported;During functional activity Standing balance-Leahy Scale: Poor Standing balance comment: needs support from RW and therapist.                             Cognition Arousal/Alertness: Awake/alert   Overall Cognitive Status: Impaired/Different from baseline Area of Impairment: Orientation;Attention;Following commands;Safety/judgement;Awareness;Problem solving                   Current Attention Level: Sustained Memory: Decreased recall of precautions Following Commands: Follows one step commands with increased time;Follows one step commands consistently   Awareness: Emergent Problem Solving: Difficulty sequencing;Requires verbal cues;Requires tactile cues General Comments: pt much improved from yesterday however continues to demonstrate difficulty with sequencing to amb with walker      Exercises      General Comments General comments (skin integrity, edema, etc.): HR increased to  147 during amb, SPo2 >98% on RA, pt had urinary continence requiring donning of mesh underwear  and maxie pad      Pertinent Vitals/Pain Pain Assessment: 0-10 Pain Score: 8  Pain Location: back Pain Descriptors / Indicators: Grimacing;Guarding Pain Intervention(s): Limited activity within patient's tolerance    Home Living                      Prior Function            PT Goals (current goals can now be found in the care plan section) Acute Rehab PT Goals Patient Stated Goal: decrease pain Progress towards PT goals: Progressing toward goals    Frequency    Min 5X/week      PT Plan Current plan remains appropriate    Co-evaluation              AM-PAC PT "6 Clicks" Mobility   Outcome Measure  Help needed turning from your back to your side while in a flat bed without using bedrails?: A Little Help needed moving from lying on your back to sitting on the side of a flat bed without using bedrails?: A Little Help needed moving to and from a bed to a chair (including a wheelchair)?: A Lot Help needed standing up from a chair using your arms (e.g., wheelchair or bedside chair)?: A Lot Help needed to walk in hospital room?: A Lot Help needed climbing 3-5 steps with a railing? : Total 6 Click Score: 13    End of Session Equipment Utilized During Treatment: Gait belt;Back brace Activity Tolerance: Patient tolerated treatment well Patient left: in chair;with call bell/phone within reach;with chair alarm set;with nursing/sitter in room Nurse Communication: Mobility status PT Visit Diagnosis: Unsteadiness on feet (R26.81);Difficulty in walking, not elsewhere classified (R26.2);Muscle weakness (generalized) (M62.81)     Time: 4656-8127 PT Time Calculation (min) (ACUTE ONLY): 30 min  Charges:  $Gait Training: 8-22 mins $Therapeutic Activity: 8-22 mins                     Kittie Plater, PT, DPT Acute Rehabilitation Services Pager #: [phone] Office #: [phone]    Berline Lopes 07/14/2018, 1:19 PM

## 2018-07-14 NOTE — Care Management Note (Signed)
Case Management Note  Patient Details  Name: Kaitlin Vargas MRN: [id number] Date of Birth: 06-19-1950  Subjective/Objective:  Pt admitted on 07/10/2018 from inpatient rehab with pain and inability to ambulate.  She underwent posterior fixation T10-L3 07/11/2018.                    Action/Plan: PT/OT recommending return to CIR; CIR admissions coordinator following for possible readmission pending progress with therapies.    Expected Discharge Date:            Expected Discharge Plan:  IP Rehab Facility  In-House Referral:  Clinical Social Work  Discharge planning Services  CM Consult  Post Acute Care Choice:    Choice offered to:     DME Arranged:    DME Agency:     HH Arranged:    [place] Agency:     Status of Service:  In process, will continue to follow  If discussed at Long Length of Stay Meetings, dates discussed:    Additional Comments:  Reinaldo Raddle, RN, BSN  Trauma/Neuro ICU Case Manager [phone]

## 2018-07-14 NOTE — Progress Notes (Addendum)
PROGRESS NOTE    Kaitlin Vargas  VVO:[id number] DOB: 1950/08/08 DOA: 07/10/2018 PCP: [name], MD   Brief Narrative: Kaitlin Vargas is a 68 y.o. femalewith medical history significant ofhypertension, hyperlipidemia, B12 deficiency, type 2 diabetes currently on insulin. Patient is being treated for MSSA bacteremia and developed worsening back pain found to have worsening abscess, s/p surgery per neurosurgery.   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 -ID recommendations: Continue Cefazolin and add Rifampin -Transesophageal Echocardiogram per ID recommendations  Spinal cord abscess S/p lumbar laminectomy, posterior fixation, arthrodesis on 2/21. Cultures obtained on 2/21 with rare staphylococcus aureus -Neurosurgery recommendations -Wound culture sensitivities pending  Essential hypertension -Continue metoprolol and lisinopril  Tachycardia Sinus. Hemoglobin of 7.6 s/p surgery. Did not improve with blood transfusion. Sinus rhythm on telemetry -Continue home metoprolol  Acute blood loss anemia In setting of surgery. S/p 2 units of PRBC on 2/22. Hemoglobin improved from 7.6 to 10.0  Diabetes mellitus, type 2 -Continue SSI -Continue Lantus 20 units; may need to increase if blood sugar continues to be elevated  Increased nutrition needs -Continue protein supplement  Chronic pain Decreased to MS contin to 15 mg BID secondary to confusion  Confusion Appears resolved once decreased MS contin.   DVT prophylaxis: SCDs Code Status:   Code Status: Full Code Family Communication: None at bedside Disposition Plan: Discharge pending infectious workup   Consultants:   Neurosurgery  Infectious  disease  Procedures:  1. Decompressive lumbar laminectomy lumbar 1-2 left with hemi-facetectomy and sublaminar decompression and discectomy.  Disc was sent for biopsy and culture; Posterior fixation T10-L3 inclusive using Alphatec cortical pedicle screws; Intertransverse arthrodesis T10-L3 using morcellized autograft and allograft soaked with a bone marrow aspirate obtained through a separate fascial incision over the left iliac crest. (07/11/2018)  Antimicrobials:  Cefazolin  Rifampin    Subjective: No concerns today.  Objective: Vitals:   07/13/18 2300 07/14/18 0300 07/14/18 0800 07/14/18 0818  BP: 130/77 (!) 144/84 134/85 (!) 149/79  Pulse: (!) 101 (!) 104 (!) 102 99  Resp: 19 16 (!) 25 (!) 21  Temp: 98.7 F (37.1 C) 98.7 F (37.1 C)  98.2 F (36.8 C)  TempSrc: Oral Oral  Oral  SpO2: 99% 95% 98% 98%  Weight:      Height:        Intake/Output Summary (Last 24 hours) at 07/14/2018 1014 Last data filed at 07/14/2018 0954 Gross per 24 hour  Intake 3649.67 ml  Output 775 ml  Net 2874.67 ml   Filed Weights   07/11/18 1433  Weight: 72.6 kg    Examination:  General exam: Appears calm and comfortable Respiratory system: Clear to auscultation. Respiratory effort normal. Cardiovascular system: S1 & S2 heard, RRR. No murmurs, rubs, gallops or clicks. Gastrointestinal system: Abdomen is nondistended, soft and nontender. No organomegaly or masses felt. Normal bowel sounds heard. Central nervous system: Alert and oriented. No focal neurological deficits. Extremities: No edema. No calf tenderness Skin: No cyanosis. No rashes Psychiatry: Judgement and insight appear normal. Mood & affect appropriate.     Data Reviewed: I have personally reviewed following labs and imaging studies  CBC: Recent Labs  Lab 07/08/18 0515 07/12/18 0722 07/13/18 2694 07/14/18  0755  WBC 4.6 6.1 6.7 6.3  HGB 10.0* 7.6* 10.0* 9.9*  HCT 32.5* 25.6* 30.9* 30.9*  MCV 102.2* 104.5* 95.7 97.2    PLT 269 301 288 782   Basic Metabolic Panel: Recent Labs  Lab 07/08/18 0515 07/12/18 1143 07/13/18 0950 07/14/18 0755  NA 135 130* 129* 131*  K 3.5 4.5 4.4 3.9  CL 97* 94* 98 100  CO2 29 27 24 25   GLUCOSE 108* 439* 323* 199*  BUN 15 17 14 9   CREATININE 0.60 0.65 0.56 0.45  CALCIUM 8.3* 8.3* 8.0* 8.1*   GFR: Estimated Creatinine Clearance: 56.2 mL/min (by C-G formula based on SCr of 0.45 mg/dL). Liver Function Tests: Recent Labs  Lab 07/08/18 0515  AST 14*  ALT <5  ALKPHOS 121  BILITOT 0.5  PROT 5.7*  ALBUMIN 1.9*   No results for input(s): LIPASE, AMYLASE in the last 168 hours. Recent Labs  Lab 07/14/18 0755  AMMONIA 21   Coagulation Profile: No results for input(s): INR, PROTIME in the last 168 hours. Cardiac Enzymes: No results for input(s): CKTOTAL, CKMB, CKMBINDEX, TROPONINI in the last 168 hours. BNP (last 3 results) No results for input(s): PROBNP in the last 8760 hours. HbA1C: No results for input(s): HGBA1C in the last 72 hours. CBG: Recent Labs  Lab 07/13/18 0819 07/13/18 1127 07/13/18 1635 07/13/18 2116 07/14/18 0748  GLUCAP 243* 281* 206* 102* 197*   Lipid Profile: No results for input(s): CHOL, HDL, LDLCALC, TRIG, CHOLHDL, LDLDIRECT in the last 72 hours. Thyroid Function Tests: No results for input(s): TSH, T4TOTAL, FREET4, T3FREE, THYROIDAB in the last 72 hours. Anemia Panel: No results for input(s): VITAMINB12, FOLATE, FERRITIN, TIBC, IRON, RETICCTPCT in the last 72 hours. Sepsis Labs: No results for input(s): PROCALCITON, LATICACIDVEN in the last 168 hours.  Recent Results (from the past 240 hour(s))  Urine Culture     Status: Abnormal   Collection Time: 07/06/18  8:37 AM  Result Value Ref Range Status   Specimen Description URINE, RANDOM  Final   Special Requests NONE  Final   Culture (A)  Final    <10,000 COLONIES/mL INSIGNIFICANT GROWTH Performed at [facility], [address] [place], [place] [address]    Report  Status 07/07/2018 FINAL  Final  Aerobic/Anaerobic Culture (surgical/deep wound)     Status: None (Preliminary result)   Collection Time: 07/11/18  4:57 PM  Result Value Ref Range Status   Specimen Description TISSUE  Final   Special Requests L1 2 DISC SPACE  Final   Gram Stain   Final    NO WBC SEEN NO ORGANISMS SEEN Performed at [facility], [place] [address] [place], [place] [address]    Culture   Final    RARE STAPHYLOCOCCUS AUREUS SUSCEPTIBILITIES TO FOLLOW CRITICAL RESULT CALLED TO, READ BACK BY AND VERIFIED WITH: C. MITCHELL,  RN CONCERNING CULTURE GROWTH AT 7846 ON 07/12/18 BY C. JESSUP, MLT.    Report Status PENDING  Incomplete  Aerobic/Anaerobic Culture (surgical/deep wound)     Status: None (Preliminary result)   Collection Time: 07/11/18  4:58 PM  Result Value Ref Range Status   Specimen Description WOUND  Final   Special Requests L1 2 DISC SPACE  Final   Gram Stain   Final    RARE WBC PRESENT, PREDOMINANTLY PMN NO ORGANISMS SEEN Performed at [facility], [address], [place], [place] [address]    Culture   Final    NO GROWTH 2 DAYS  NO ANAEROBES ISOLATED; CULTURE IN PROGRESS FOR 5 DAYS   Report Status PENDING  Incomplete         Radiology Studies: No results found.      Scheduled Meds: . aspirin  81 mg Oral q morning - 10a  . bethanechol  10 mg Oral TID  . feeding supplement (GLUCERNA SHAKE)  237 mL Oral TID WC  . feeding supplement (PRO-STAT SUGAR FREE 64)  30 mL Oral BID  . ferrous sulfate  325 mg Oral Q breakfast  . furosemide  40 mg Oral Daily  . insulin aspart  0-15 Units Subcutaneous TID WC  . insulin glargine  20 Units Subcutaneous Daily  . ketoconazole   Topical BID  . lisinopril  10 mg Oral Daily  . metoprolol tartrate  25 mg Oral BID  . morphine  15 mg Oral Q12H  . multivitamin with minerals  1 tablet Oral Daily  . pantoprazole  40 mg Oral Daily  . polyethylene glycol  17 g Oral Daily  . rifampin  300 mg Oral Q12H  . senna   1 tablet Oral BID  . sodium chloride flush  10-40 mL Intracatheter Q12H  . sodium chloride flush  3 mL Intravenous Q12H  . vitamin B-12  1,000 mcg Oral Daily   Continuous Infusions: . sodium chloride    . 0.9 % NaCl with KCl 20 mEq / L 50 mL/hr at 07/14/18 0750  .  ceFAZolin (ANCEF) IV 2 g (07/14/18 0610)  . methocarbamol (ROBAXIN) IV       LOS: 4 days     Cordelia Poche, MD [facility] 07/14/2018, 10:14 AM  If 7PM-7AM, please contact night-coverage [email or web address]

## 2018-07-14 NOTE — Progress Notes (Signed)
PHARMACY CONSULT NOTE FOR:  OUTPATIENT  PARENTERAL ANTIBIOTIC THERAPY (OPAT)  Indication: MSSA L-spine discitis Regimen: Cefazolin 2 gm every 8 hours + rifampin 300 mg BID End date: 09/05/2018  IV antibiotic discharge orders are pended. To discharging provider:  please sign these orders via discharge navigator,  Select New Orders & click on the button choice - Manage This Unsigned Work.     Thank you for allowing pharmacy to be a part of this patient's care.  Jimmy Footman, PharmD, BCPS, BCIDP Infectious Diseases Clinical Pharmacist Phone: [phone] 07/14/2018, 4:22 PM

## 2018-07-14 NOTE — Social Work (Signed)
CSW acknowledging consult for SNF placement. Will follow for therapy recommendations. Currently recommendations are for CIR. If pt unable to return to CIR will facilitate placement at SNF.   [name], MSW, [place] Work [phone]

## 2018-07-14 NOTE — Progress Notes (Signed)
Patient ID: Kaitlin Vargas, female   DOB: 1951-04-27, 68 y.o.   MRN: [id number] Doing well, pain controlled, Na 129, hemovac 80 cc, moves legs well, denies NTW. On ancef and rifampin. Following.

## 2018-07-14 NOTE — Progress Notes (Signed)
[facility] for Infectious Disease  Date of Admission:  07/10/2018     Total days of antibiotics Cefazolin day 3 from surgery  Patient ID: Kaitlin Vargas is a 68 y.o. 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 aspirin  81 mg Oral q morning - 10a  . bethanechol  10 mg Oral TID  . feeding supplement (GLUCERNA SHAKE)  237 mL Oral TID WC  . feeding supplement (PRO-STAT SUGAR FREE 64)  30 mL Oral BID  . ferrous sulfate  325 mg Oral Q breakfast  . furosemide  40 mg Oral Daily  . insulin aspart  0-15 Units Subcutaneous TID WC  . insulin glargine  20 Units Subcutaneous Daily  . ketoconazole   Topical BID  . lisinopril  10 mg Oral Daily  . metoprolol tartrate  25 mg Oral BID  . morphine  15 mg Oral Q12H  . multivitamin with minerals  1 tablet Oral Daily  . ondansetron  4 mg Oral BID  . pantoprazole  40 mg Oral Daily  . polyethylene glycol  17 g Oral Daily  . rifampin  300 mg Oral Q12H  . senna  1 tablet Oral BID  . sodium chloride flush  10-40 mL Intracatheter Q12H  . sodium chloride flush  3 mL Intravenous Q12H  . vitamin B-12  1,000 mcg Oral Daily    SUBJECTIVE: Having some trouble with bowels (labile constipation and loose stools). One today but is getting miralax. Less back pain. Will be working with PT later today to get up and resume her rehab following recent surgery.   Review of Systems: Review of Systems  Constitutional: Negative for chills and fever.  HENT: Negative for tinnitus.   Eyes: Negative for blurred vision and photophobia.  Respiratory: Negative for cough and sputum production.   Cardiovascular: Negative for chest pain.  Gastrointestinal: Negative for diarrhea, nausea and vomiting.  Genitourinary: Negative for dysuria.    Musculoskeletal: Positive for back pain (improved).  Skin: Negative for rash.  Neurological: Negative for dizziness, focal weakness and headaches.  PICC line is without pain, drainage or erythema and is well maintained. No swelling or altered sensation in affected distal extremity.    Allergies  Allergen Reactions  . Sulfa Antibiotics Rash    OBJECTIVE: Vitals:   07/14/18 0300 07/14/18 0800 07/14/18 0818 07/14/18 1251  BP: (!) 144/84 134/85 (!) 149/79 108/79  Pulse: (!) 104 (!) 102 99 99  Resp: 16 (!) 25 (!) 21 19  Temp: 98.7 F (37.1 C)  98.2 F (36.8 C) 99.1 F (37.3 C)  TempSrc: Oral  Oral Oral  SpO2: 95% 98% 98% 99%  Weight:      Height:       Body mass index is 33.44 kg/m.  Physical Exam HENT:     Mouth/Throat:     Mouth: Mucous membranes are moist. No oral lesions.     Dentition: No dental abscesses.     Pharynx: No oropharyngeal exudate.  Eyes:     General: No scleral icterus.    Pupils: Pupils are equal, round, and reactive to light.  Cardiovascular:     Rate and Rhythm: Normal rate and regular rhythm.  Heart sounds: Normal heart sounds.  Pulmonary:     Effort: Pulmonary effort is normal.     Breath sounds: Normal breath sounds.  Abdominal:     General: There is no distension.     Palpations: Abdomen is soft.     Tenderness: There is no abdominal tenderness.  Musculoskeletal: Normal range of motion.        General: No tenderness.  Lymphadenopathy:     Cervical: No cervical adenopathy.  Skin:    General: Skin is warm and dry.     Findings: No rash.     Comments: PICC line - clean/dry dressing. Insertion site w/o erythema, tenderness, drainage, cording or distal swelling of affected extremity    Neurological:     General: No focal deficit present.     Mental Status: She is alert and oriented to person, place, and time.     Comments: Able to raise both legs to gravity easily and against resistance. Equal strength bilaterally.   Psychiatric:         Judgment: Judgment normal.     Lab Results Lab Results  Component Value Date   WBC 6.3 07/14/2018   HGB 9.9 (L) 07/14/2018   HCT 30.9 (L) 07/14/2018   MCV 97.2 07/14/2018   PLT 304 07/14/2018    Lab Results  Component Value Date   CREATININE 0.45 07/14/2018   BUN 9 07/14/2018   NA 131 (L) 07/14/2018   K 3.9 07/14/2018   CL 100 07/14/2018   CO2 25 07/14/2018    Lab Results  Component Value Date   ALT <5 07/08/2018   AST 14 (L) 07/08/2018   ALKPHOS 121 07/08/2018   BILITOT 0.5 07/08/2018     Microbiology: Recent Results (from the past 240 hour(s))  Urine Culture     Status: Abnormal   Collection Time: 07/06/18  8:37 AM  Result Value Ref Range Status   Specimen Description URINE, RANDOM  Final   Special Requests NONE  Final   Culture (A)  Final    <10,000 COLONIES/mL INSIGNIFICANT GROWTH Performed at [facility], [address], [place], [place] [address]    Report Status 07/07/2018 FINAL  Final  Aerobic/Anaerobic Culture (surgical/deep wound)     Status: None (Preliminary result)   Collection Time: 07/11/18  4:57 PM  Result Value Ref Range Status   Specimen Description TISSUE  Final   Special Requests L1 2 DISC SPACE  Final   Gram Stain NO WBC SEEN NO ORGANISMS SEEN   Final   Culture   Final    RARE STAPHYLOCOCCUS AUREUS CRITICAL RESULT CALLED TO, READ BACK BY AND VERIFIED WITH: C. MITCHELL,  RN CONCERNING CULTURE GROWTH AT 6578 ON 07/12/18 BY C. JESSUP, MLT.    Report Status PENDING  Incomplete   Organism ID, Bacteria STAPHYLOCOCCUS AUREUS  Final      Susceptibility   Staphylococcus aureus - MIC*    CIPROFLOXACIN <=0.5 SENSITIVE Sensitive     ERYTHROMYCIN >=8 RESISTANT Resistant     GENTAMICIN <=0.5 SENSITIVE Sensitive     OXACILLIN 0.5 SENSITIVE Sensitive     TETRACYCLINE <=1 SENSITIVE Sensitive     VANCOMYCIN 1 SENSITIVE Sensitive     TRIMETH/SULFA <=10 SENSITIVE Sensitive     CLINDAMYCIN <=0.25 SENSITIVE Sensitive     RIFAMPIN <=0.5  SENSITIVE Sensitive     Inducible Clindamycin Value in next row Sensitive      NEGATIVEPerformed at [facility] [address], [place], [place]  [address]    * RARE STAPHYLOCOCCUS AUREUS  Aerobic/Anaerobic Culture (surgical/deep wound)     Status: None (Preliminary result)   Collection Time: 07/11/18  4:58 PM  Result Value Ref Range Status   Specimen Description WOUND  Final   Special Requests L1 2 DISC SPACE  Final   Gram Stain   Final    RARE WBC PRESENT, PREDOMINANTLY PMN NO ORGANISMS SEEN    Culture   Final    NO GROWTH 3 DAYS NO ANAEROBES ISOLATED; CULTURE IN PROGRESS FOR 5 DAYS Performed at [facility] [address] [place], [place] [address]    Report Status PENDING  Incomplete          ASSESSMENT: Kaitlin Vargas is a 69 y.o. female with MSSA discitis and epidural abscess. About 3 weeks into therapy she developed increased pain; repeated imaging with ongoing destruction of L1-2 and developing kyphotic deformity. Due to limited mobility and pain she was taken to OR on 2/21 for debridement of epidural abscess/phlegmon and fusion to stabilize T10-L3. She continues on Rifampin 600 mg QD and Cefazolin IV. Intra-op cultures with MSSA (S-vanc, bactrim, tetracycline).   PLAN: 1. MSSA Bacteremia = TTE negative for any valve dysfunction or evidence of IE. TEE is currently being arranged - however not certain she needs this with no valvular dysfunction on TTE and no other evidence of metastatic infection that would indicate IE. Will d/w Dr. Baxter Flattery more. She previously cleared infection quickly. No evidence of relapsing bacteremia.   2. L1-2 Discitis with epidural abscess = s/p laminectomy and fusion Ronnald Ramp 2/21). Rifampin added d/t hardware insertion during fusion. Would re-start her antibiotic clock as of her surgery date for 8 weeks.   3. Medication Monitoring  = no major/significant DDIs with RIF on board. Will need LFTs added for monitoring to her labs (OPAT updated below)    4. Disposition = looks like PT is recommending re-admission back to CIR.    OPAT ORDERS:  Diagnosis: MSSA L-Spine discitis/epidural abscess in the setting of bacteremia   Culture Result: MSSA   Allergies  Allergen Reactions  . Sulfa Antibiotics Rash    Discharge antibiotics: Cefazolin 2 gm IV Q8h   Duration: 8 weeks   End Date: April 17th   [place] and Maintenance Per Protocol _x_ Please pull PIC at completion of IV antibiotics __ Please leave PIC in place until doctor has seen patient or been notified  Labs weekly while on IV antibiotics: _x_ CBC with differential __ BMP __ BMP TWICE WEEKLY** _x_ CMP _x_ CRP _x_ ESR __ Vancomycin trough  Fax weekly labs to [phone]  Clinic Follow Up Appt: April 9th @ 9:00 am with Dr. Glori Bickers, MSN, NP-C [facility] for [place]: [phone] Pager: [phone]  07/14/2018  1:18 PM

## 2018-07-14 NOTE — Progress Notes (Signed)
Inpatient Rehabilitation Admissions Coordinator  I met with patient and her spouse at bedside to discuss possible readmission to inpt rehab Pt would like to see how she does with therapy today and then I will follow up. [employer] will have to approval any rehab venue.  Danne Baxter, RN, MSN Rehab Admissions Coordinator [phone] 07/14/2018 9:08 AM

## 2018-07-14 NOTE — Progress Notes (Signed)
Occupational Therapy Treatment Patient Details Name: Kaitlin Vargas MRN: [id number] DOB: May 29, 1950 Today's Date: 07/14/2018    History of present illness Pt is a 68 y.o. female who was admitted to [facility] 06/16/18 with back pain. 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 She transferred to [facility] for lumbar puncture on 1/29; during procedure, pt with active bleeding and cardiac arrest requiring CPR.  On 06/26/2018 she was transferred to inpatient rehab to continue inpatient rehab with IV antibiotics and PICC line on right arm. Abscess not responding to antibiotics. Pt with continued pain and inability to ambulate. On 07/10/18 pt re-admitted to acute care. She underwent posterior fixation T10-L3 07/11/18.  Post op anemia requiring PRBCs on 07/12/18.  PMH includes peripheral neuropathy, HTN, DM.     OT comments  This 68 yo female admitted and underwent above presents to acute OT with increasing bed mobility slowly and able to sit EOB and perform 2 grooming activities fully with setup/S, but could not do hair due to too many knots. With attempt to side scoot up in bed pt with tendency for posterior lean. Pt needing cues to follow back precautions for bed mobility. She will continue to benefit from acute OT with follow up OT on CIR.  Follow Up Recommendations  CIR;Supervision/Assistance - 24 hour    Equipment Recommendations  Other (comment)(TBD next venue)       Precautions / Restrictions Precautions Precautions: Fall;Back Precaution Comments: reviewed precautions Required Braces or Orthoses: Spinal Brace;Other Brace Spinal Brace: Applied in sitting position Restrictions Weight Bearing Restrictions: No       Mobility Bed Mobility Overal bed mobility: Needs Assistance Bed Mobility: Rolling;Sidelying to Sit;Sit to Sidelying Rolling: Min assist(VCs for positioning and sequencing) Sidelying to sit: Min assist(increased time and A for trunk)     Sit to sidelying: Mod assist(for  legs)     Balance Overall balance assessment: Needs assistance Sitting-balance support: No upper extremity supported;Feet supported Sitting balance-Leahy Scale: Fair Sitting balance - Comments: If she goes to attempt to re-adjust self she has a tendency for posterior lean                     ADL either performed or assessed with clinical judgement   ADL Overall ADL's : Needs assistance/impaired     Grooming: Oral care;Wash/dry face;Brushing hair;Set up;Supervision/safety;Sitting Grooming Details (indicate cue type and reason): EOB                                     Vision Baseline Vision/History: Wears glasses Wears Glasses: Reading only Patient Visual Report: No change from baseline            Cognition Arousal/Alertness: Awake/alert Behavior During Therapy: Impulsive Overall Cognitive Status: Impaired/Different from baseline Area of Impairment: Following commands;Safety/judgement;Problem solving                   Memory: Decreased recall of precautions Following Commands: Follows one step commands with increased time Safety/Judgement: Decreased awareness of safety;Decreased awareness of deficits Problem Solving: Difficulty sequencing;Requires verbal cues General Comments: was going to put toothpaste in dentures. When asked if she normally uses fixodent she said no.                   Pertinent Vitals/ Pain       Pain Assessment: 0-10 Pain Score: 8  Pain Location: back Pain Descriptors / Indicators: Grimacing;Aching;Sore Pain Intervention(s):  Limited activity within patient's tolerance;Monitored during session;Repositioned     Prior Functioning/Environment              Frequency  Min 2X/week        Progress Toward Goals  OT Goals(current goals can now be found in the care plan section)  Progress towards OT goals: Progressing toward goals  Acute Rehab OT Goals Patient Stated Goal: decrease pain  Plan Discharge  plan remains appropriate       AM-PAC OT "6 Clicks" Daily Activity     Outcome Measure   Help from another person eating meals?: A Little Help from another person taking care of personal grooming?: A Little Help from another person toileting, which includes using toliet, bedpan, or urinal?: Total Help from another person bathing (including washing, rinsing, drying)?: A Lot Help from another person to put on and taking off regular upper body clothing?: A Little Help from another person to put on and taking off regular lower body clothing?: Total 6 Click Score: 13    End of Session    OT Visit Diagnosis: Unsteadiness on feet (R26.81);Pain;Other symptoms and signs involving cognitive function;Muscle weakness (generalized) (M62.81) Pain - part of body: (back)   Activity Tolerance Patient tolerated treatment well   Patient Left in bed;with call bell/phone within reach;with bed alarm set   Nurse Communication Patient requests pain meds        Time: 5686-1683 OT Time Calculation (min): 21 min  Charges: OT General Charges $OT Visit: 1 Visit OT Treatments $Self Care/Home Management : 8-22 mins  [place] [address], OTR/L Acute [employer] Pager [phone] Office [phone]      Almon Register 07/14/2018, 4:16 PM

## 2018-07-15 ENCOUNTER — Other Ambulatory Visit: Payer: Self-pay

## 2018-07-15 ENCOUNTER — Encounter (HOSPITAL_COMMUNITY)
Admission: AD | Disposition: A | Discharge status: Rehabilitation Facility | Payer: Self-pay | Source: Ambulatory Visit | Attending: Family Medicine

## 2018-07-15 ENCOUNTER — Inpatient Hospital Stay (HOSPITAL_COMMUNITY): Payer: Medicare HMO

## 2018-07-15 ENCOUNTER — Encounter (HOSPITAL_COMMUNITY): Payer: Self-pay | Admitting: *Deleted

## 2018-07-15 ENCOUNTER — Inpatient Hospital Stay: Payer: Medicare HMO | Admitting: Internal Medicine

## 2018-07-15 DIAGNOSIS — I340 Nonrheumatic mitral (valve) insufficiency: Secondary | ICD-10-CM

## 2018-07-15 DIAGNOSIS — R7881 Bacteremia: Secondary | ICD-10-CM

## 2018-07-15 HISTORY — PX: TEE WITHOUT CARDIOVERSION: SHX5443

## 2018-07-15 LAB — GLUCOSE, CAPILLARY
Glucose-Capillary: 208 mg/dL — ABNORMAL HIGH (ref 70–99)
Glucose-Capillary: 194 mg/dL — ABNORMAL HIGH (ref 70–99)
Glucose-Capillary: 273 mg/dL — ABNORMAL HIGH (ref 70–99)
Glucose-Capillary: 84 mg/dL (ref 70–99)

## 2018-07-15 SURGERY — ECHOCARDIOGRAM, TRANSESOPHAGEAL
Anesthesia: Moderate Sedation

## 2018-07-15 MED ORDER — SODIUM CHLORIDE 0.9 % IV SOLN: INTRAVENOUS | Status: DC

## 2018-07-15 MED ORDER — MIDAZOLAM HCL (PF) 10 MG/2ML IJ SOLN
INTRAMUSCULAR | Status: DC | PRN
  Administered 2018-07-15: 2 mg via INTRAVENOUS
  Administered 2018-07-15: 1 mg via INTRAVENOUS
  Administered 2018-07-15: 2 mg via INTRAVENOUS

## 2018-07-15 MED ORDER — FENTANYL CITRATE (PF) 100 MCG/2ML IJ SOLN
INTRAMUSCULAR | Status: AC
  Filled 2018-07-15: qty 2

## 2018-07-15 MED ORDER — MIDAZOLAM HCL (PF) 5 MG/ML IJ SOLN
INTRAMUSCULAR | Status: AC
  Filled 2018-07-15: qty 2

## 2018-07-15 MED ORDER — FENTANYL CITRATE (PF) 100 MCG/2ML IJ SOLN
INTRAMUSCULAR | Status: DC | PRN
  Administered 2018-07-15: 25 ug via INTRAVENOUS

## 2018-07-15 NOTE — H&P (Signed)
Physical Medicine and Rehabilitation Admission H&P    Chief complaint:Back pain  HPI: Kaitlin Vargas a 68 year old right-handed female with history of hypertension, hyperlipidemia, B12 deficiency and diabetes mellitus. Patient with reported back pain 4 weeks. Per chart review and patient, patient lives with spouse. One level home with ramped entrance. Independent with assistive device prior to admission. She recently came to the hospital with cough diagnosed with adenovirus in mid-January received antibiotic coverage recovering nicely was discharged to home. She continued to have back pain radiating to the lower extremities as well as bouts of urinary incontinence and stool incontinence. She presented to [facility] on 06/16/2018 with increasing back pain with lower extremity weakness and a CT scan of the spine showed what appeared to be osteomyelitis. She was transferred to [facility] for interventional radiology completed lumbar puncture and during the procedure noted increase active bleeding as well as cardiac arrest requiring CPR 90 seconds. She did not require intubation. It was felt cardiac arrest likely due to respiratory arrest following sedation. 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 Patient also with right foot ulceration. MRI of the foot negative for acute bony or joint abnormality. Small volume of fluid in the subcutaneous tissue deep to the first TMT joint. Infectious disease consulted patient with GPCs growing from lumbar spine aspirate. Placed on antibiotic therapy. TEE negative for vegetation. She had been placed on Cefazolin per infectious disease through 08/13/2018. Subcutaneous heparin for DVT prophylaxis. Hospital course of pain management maintained on long-acting MS Contin. Acute on chronic anemia 7.6 transfused 2 units of packed red blood cells 07/12/2018 and monitored  closely initial bouts of hyponatremia 130. Therapy evaluations completed and patient was admitted for a comprehensive rehabilitation program to 06/26/2018. Patient was progressing nicely and developed increasing back pain 07/09/2018. 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 Also showed prevertebral abscess. Infectious disease as well as neurosurgery consulted and plan was to discharge to acute care services for ongoing care 07/10/2018. After long discussion between neurosurgery and infectious disease it was decided to explore abscess as patient appeared not to be responding to antibiotics and underwent decompressive lumbar laminectomy lumbar 1-2 left with hemi-facetectomy and sublaminar decompression and discectomy. Disc was sent for biopsy and culture. Posterior fixation T10-L3 using cortical pedicle screws. Intertransverse arthrodesis T10-L3 with bone marrow aspirate obtained through a separate fascial incision over the left iliac crest 07/11/2018 per Dr. Sherley Bounds. Back brace when out of bed applied in sitting position.Pain management with the use of MS Contin monitoring mental status for confusion. Infectious disease follow-up current plan is for 8 weeks of IV cefazolin plus oral rifampin 300 mg twice a day from date of surgery 07/11/2018. TEE completed 07/15/2018 to rule out endocarditis showed a small oscillating density on the aortic side of aortic valve concerning for vegetation.  No further intervention and advised to continue antibiotic therapy as directed.follow-up cardiothoracic surgery in regards to aortic valve endocarditis no plan for surgical intervention. Patient with bouts of urinary retention currently maintained on Urecholine. Therapy evaluations resumed with steady progress. Patient is admitted to inpatient rehabilitation services to continue comprehensive therapies.  Review of Systems  HENT: Negative for hearing loss.    Eyes: Negative for blurred vision and double vision.  Respiratory: Positive for shortness of breath. Negative for cough.   Cardiovascular: Negative for chest pain.  Gastrointestinal: Positive for  constipation. Negative for nausea and vomiting.  Genitourinary: Negative for dysuria, flank pain and hematuria.  Musculoskeletal: Positive for back pain, joint pain and myalgias.  Skin: Negative for rash.  Neurological: Positive for weakness.  All other systems reviewed and are negative.  Past Medical History:  Diagnosis Date  . Anemia   . Diabetes mellitus without complication ([place])   . Hyperlipidemia   . Hypertension   . Peripheral neuropathy    Past Surgical History:  Procedure Laterality Date  . ABDOMINAL HYSTERECTOMY    . IR LUMBAR DISC ASPIRATION W/IMG GUIDE  06/18/2018   Family History  Problem Relation Age of Onset  . Cancer Mother   . Stroke Mother   . Cancer Father   . Leukemia Father    Social History:  reports that she has never smoked. She has never used smokeless tobacco. She reports that she does not drink alcohol or use drugs. Allergies:  Allergies  Allergen Reactions  . Sulfa Antibiotics Rash   Medications Prior to Admission  Medication Sig Dispense Refill  . albuterol (PROVENTIL) (2.5 MG/3ML) 0.083% nebulizer solution Take 3 mLs (2.5 mg total) by nebulization every 2 (two) hours as needed for wheezing or shortness of breath. 75 mL 12  . aspirin 81 MG chewable tablet Chew 81 mg by mouth every morning.     . bethanechol (URECHOLINE) 10 MG tablet Take 1 tablet (10 mg total) by mouth 3 (three) times daily.    . bisacodyl (DULCOLAX) 10 MG suppository Place 1 suppository (10 mg total) rectally daily as needed for moderate constipation. 12 suppository 0  . ceFAZolin (ANCEF) IVPB Inject 2 g into the vein every 8 (eight) hours. Indication: MSSA discitis/osteomyelitis Last Day of Therapy: 08/13/2018 Labs - Once weekly:  CBC/D and BMP, Labs - Every other week:  ESR and CRP  162 Units 0  . furosemide (LASIX) 20 MG tablet Take 2 tablets (40 mg total) by mouth daily.    . heparin 5000 UNIT/ML injection Inject 1 mL (5,000 Units total) into the skin every 8 (eight) hours. 1 mL   . HYDROcodone-acetaminophen (NORCO) 10-325 MG tablet Take 1 tablet by mouth every 4 (four) hours as needed for moderate pain. 30 tablet 0  . insulin aspart (NOVOLOG) 100 UNIT/ML injection Inject 0-9 Units into the skin 3 (three) times daily with meals. 10 mL 11  . insulin glargine (LANTUS) 100 UNIT/ML injection Inject 0.2 mLs (20 Units total) into the skin daily. 10 mL 11  . ketoconazole (NIZORAL) 2 % cream Apply topically 2 (two) times daily. 15 g 0  . methocarbamol (ROBAXIN) 750 MG tablet Take 1 tablet (750 mg total) by mouth every 6 (six) hours.    . metoprolol tartrate (LOPRESSOR) 25 MG tablet Take 1 tablet (25 mg total) by mouth 2 (two) times daily.    Marland Kitchen morphine (MS CONTIN) 30 MG 12 hr tablet Take 1 tablet (30 mg total) by mouth every 12 (twelve) hours.  0  . Multiple Vitamin (THERA) TABS Take 1 tablet by mouth daily.     . pantoprazole (PROTONIX) 40 MG tablet Take 1 tablet (40 mg total) by mouth daily. 30 tablet 1  . senna-docusate (SENOKOT-S) 8.6-50 MG tablet Take 2 tablets by mouth at bedtime.    . vitamin B-12 (CYANOCOBALAMIN) 1000 MCG tablet Take 1 tablet (1,000 mcg total) by mouth daily for 30 days. 30 tablet 0    Drug Regimen Review Drug regimen was reviewed and remains appropriate with no significant issues identified  Home: Home Living Family/patient expects to be discharged to:: Private residence Living Arrangements: Spouse/significant other Available Help at Discharge: Family, Available 24 hours/day Type of Home: House Home Access: [place]: One level Bathroom Shower/Tub: Chiropodist: Standard Bathroom Accessibility: Yes Home Equipment: Environmental consultant - 2 wheels, Cane - single point Additional Comments: Ambulated with cane and RW PTA   Lives With: Spouse, Daughter   Functional History: Prior Function Level of Independence: Independent with assistive device(s)  Functional Status:  Mobility: Bed Mobility Overal bed mobility: Needs Assistance Bed Mobility: Rolling, Sidelying to Sit, Sit to Sidelying Rolling: Min assist(VCs for positioning and sequencing) Sidelying to sit: Min assist(increased time and A for trunk) Sit to sidelying: Mod assist(for legs) General bed mobility comments: pt initiated well, increased time, minA to complete tast Transfers Overall transfer level: Needs assistance Equipment used: Rolling walker (2 wheeled) Transfers: Sit to/from Stand Sit to Stand: Mod assist Stand pivot transfers: Mod assist General transfer comment: mod assist to power up and block feet from sliding out under her, Ambulation/Gait Ambulation/Gait assistance: Mod assist, +2 safety/equipment Gait Distance (Feet): 65 Feet Assistive device: Rolling walker (2 wheeled) Gait Pattern/deviations: Step-through pattern General Gait Details: pt very shaky, requiring modA for walker management, and max diretional v/c's to contiuing stepping insteady of only pushing walker forward Gait velocity: dec Gait velocity interpretation: <1.31 ft/sec, indicative of household ambulator    ADL: ADL Overall ADL's : Needs assistance/impaired Grooming: Oral care, Wash/dry face, Brushing hair, Set up, Supervision/safety, Sitting Grooming Details (indicate cue type and reason): EOB Upper Body Bathing: Minimal assistance, Sitting Lower Body Bathing: Total assistance, +2 for physical assistance, Sit to/from stand Upper Body Dressing : Minimal assistance, Sitting Lower Body Dressing: Total assistance, +2 for physical assistance, Sit to/from stand Toilet Transfer: +2 for physical assistance, +2 for safety/equipment, Stand-pivot, RW, Maximal assistance Toilet Transfer Details (indicate cue type and reason): simulated from recliner to EOB  Functional  mobility during ADLs: Maximal assistance, +2 for physical assistance, +2 for safety/equipment, Rolling walker General ADL Comments: patient requires constant redirection to attend to current task, limited by pain, tolerance and hypotensive during session   Cognition: Cognition Overall Cognitive Status: Impaired/Different from baseline Orientation Level: (P) Oriented X4 Memory: Appears intact Safety/Judgment: Appears intact Cognition Arousal/Alertness: Awake/alert Behavior During Therapy: Impulsive Overall Cognitive Status: Impaired/Different from baseline Area of Impairment: Following commands, Safety/judgement, Problem solving Orientation Level: Disoriented to, Time Current Attention Level: Sustained Memory: Decreased recall of precautions Following Commands: Follows one step commands with increased time Safety/Judgement: Decreased awareness of safety, Decreased awareness of deficits Awareness: Emergent Problem Solving: Difficulty sequencing, Requires verbal cues General Comments: was going to put toothpaste in dentures. When asked if she normally uses fixodent she said no.  Physical Exam: Blood pressure 127/62, pulse 99, temperature 98.6 F (37 C), temperature source Oral, resp. rate 20, height 4' 10" (1.473 m), weight 72.6 kg, SpO2 97 %. Physical Exam  Vitals reviewed. Constitutional: She appears well-developed.  Obese  HENT:  Head: Normocephalic and atraumatic.  Eyes: EOM are normal. Right eye exhibits no discharge. Left eye exhibits no discharge.  Neck: Normal range of motion. Neck supple. No thyromegaly present.  Cardiovascular: Normal rate and regular rhythm.  Respiratory: Effort normal and breath sounds normal.  GI: Soft. Bowel sounds are normal. She exhibits no distension.  Musculoskeletal:     Comments: No edema or tenderness in extremities  Neurological: She is alert.  Makes eye contact with examiner.  Provides her name and age.  She could not recall her full  hospital course. Motor: Grossly 4-5/5 throughout [facility]  Skin:  Back brace in place with dressing due to back incision. Foam dressing in place to right foot  Psychiatric: Her affect is blunt. Her speech is delayed. She is slowed.    Results for orders placed or performed during the hospital encounter of 07/10/18 (from the past 48 hour(s))  Glucose, capillary     Status: Abnormal   Collection Time: 07/14/18  7:48 AM  Result Value Ref Range   Glucose-Capillary 197 (H) 70 - 99 mg/dL  Basic metabolic panel     Status: Abnormal   Collection Time: 07/14/18  7:55 AM  Result Value Ref Range   Sodium 131 (L) 135 - 145 mmol/L   Potassium 3.9 3.5 - 5.1 mmol/L   Chloride 100 98 - 111 mmol/L   CO2 25 22 - 32 mmol/L   Glucose, Bld 199 (H) 70 - 99 mg/dL   BUN 9 8 - 23 mg/dL   Creatinine, Ser 0.45 0.44 - 1.00 mg/dL   Calcium 8.1 (L) 8.9 - 10.3 mg/dL   GFR calc non Af Amer >60 >60 mL/min   GFR calc Af Amer >60 >60 mL/min   Anion gap 6 5 - 15    Comment: Performed at [facility], [place] [address] [place], [place] [address]  Ammonia     Status: None   Collection Time: 07/14/18  7:55 AM  Result Value Ref Range   Ammonia 21 9 - 35 umol/L    Comment: Performed at [facility], [place] [address] [place], [place] [address]  CBC     Status: Abnormal   Collection Time: 07/14/18  7:55 AM  Result Value Ref Range   WBC 6.3 4.0 - 10.5 K/uL   RBC 3.18 (L) 3.87 - 5.11 MIL/uL   Hemoglobin 9.9 (L) 12.0 - 15.0 g/dL   HCT 30.9 (L) 36.0 - 46.0 %   MCV 97.2 80.0 - 100.0 fL   MCH 31.1 26.0 - 34.0 pg   MCHC 32.0 30.0 - 36.0 g/dL   RDW 18.7 (H) 11.5 - 15.5 %   Platelets 304 150 - 400 K/uL   nRBC 0.0 0.0 - 0.2 %    Comment: Performed at [facility], [place] [address] [place], [place] [address]  Glucose, capillary     Status: Abnormal   Collection Time: 07/14/18 11:36 AM  Result Value Ref Range   Glucose-Capillary 198 (H) 70 - 99 mg/dL   Comment 1 Notify RN    Comment 2 Document in Chart     Glucose, capillary     Status: Abnormal   Collection Time: 07/14/18  3:44 PM  Result Value Ref Range   Glucose-Capillary 268 (H) 70 - 99 mg/dL   Comment 1 Notify RN    Comment 2 Document in Chart   Glucose, capillary     Status: Abnormal   Collection Time: 07/14/18  9:23 PM  Result Value Ref Range   Glucose-Capillary 114 (H) 70 - 99 mg/dL   Comment 1 Notify RN   Glucose, capillary     Status: Abnormal   Collection Time: 07/15/18  8:49 AM  Result Value Ref Range   Glucose-Capillary 208 (H) 70 - 99 mg/dL  Glucose, capillary     Status: Abnormal   Collection Time: 07/15/18 11:21 AM  Result Value Ref Range   Glucose-Capillary 194 (H) 70 - 99 mg/dL  Glucose, capillary  Status: Abnormal   Collection Time: 07/15/18  4:14 PM  Result Value Ref Range   Glucose-Capillary 273 (H) 70 - 99 mg/dL  Glucose, capillary     Status: None   Collection Time: 07/15/18  9:41 PM  Result Value Ref Range   Glucose-Capillary 84 70 - 99 mg/dL   No results found.     Medical Problem List and Plan: 1.  Decreased functional mobility lower extremity weakness secondary to MSSA lumbar spine discitis/osteomyelitis epidural abscess in the setting of bacteremia as well as right foot ulceration complicated by cardiac arrest.S/Pdecompressive lumbar laminectomy L-1-2 with hemi-facetectomy, posterior fixation T10-L3 07/11/2018 per Dr. Sherley Bounds. Back brace when out of bed applied in sitting position.  Admit to CIR 2.  Antithrombotics: -DVT/anticoagulation:  SCDs.  Initial vascular study to 12/08/2018 negative  -antiplatelet therapy: none 3. Pain Management: MS Contin 15 mg every 12 hours, Robaxin and hydrocodone as needed.Monitor mental status secondary to narcotics 4. Mood: Provide emotional support  -antipsychotic agents: none 5. Neuropsych: This patient is capable of making decisions on her own behalf. 6. Skin/Wound Care:  Foam dressing to right foot change every 3 days as needed..Routine skin checks 7.  Fluids/Electrolytes/Nutrition:  Routine in and out's with follow-up chemistries in a.m. 8. ID/MSSA bacteremia/epidural abscess.IV Ancef 2 g every 8 hours,rifampin 300 mg every 12 hours initiated 07/11/2018 8 weeks. Follow-up infectious disease 9. Acute on chronic anemia. Patient has been transfused 2 units pack red blood cells 07/12/2018 Follow-up CBC in a.m. Continue iron supplement 10. Essential hypertension. Lopressor 25 mg twice a day,Lasix 40 mg daily, lisinopril 10 mg daily. Monitor with increased mobility 11. Diabetes mellitus with peripheral neuropathy. Hemoglobin A1c 8.7. Lantus insulin 20 units daily. Check blood sugars before meals and at bedtime.  Monitor with increased mobility 12. Urinary retention. Urecholine 10 mg 3 times a day. Check PVR 3 13. B12 deficiency. Continue vitamin B-12  Cathlyn Parsons, PA-C 07/16/2018

## 2018-07-15 NOTE — Interval H&P Note (Signed)
History and Physical Interval Note:  07/15/2018 9:27 AM  Kaitlin Vargas  has presented today for surgery, with the diagnosis of BACTEREMIA  The various methods of treatment have been discussed with the patient and family. After consideration of risks, benefits and other options for treatment, the patient has consented to  Procedure(s): TRANSESOPHAGEAL ECHOCARDIOGRAM (TEE) (N/A) as a surgical intervention .  The patient's history has been reviewed, patient examined, no change in status, stable for surgery.  I have reviewed the patient's chart and labs.  Questions were answered to the patient's satisfaction.     Kirk Ruths

## 2018-07-15 NOTE — Anesthesia Postprocedure Evaluation (Signed)
Anesthesia Post Note  Patient: Kaitlin Vargas  Procedure(s) Performed: Thoracic ten-Lumbar three Fusion with instrumentation-Lumbar one-two Decompressive Laminectomy  (N/A Back)     Patient location during evaluation: PACU Anesthesia Type: General Level of consciousness: awake and alert Pain management: pain level controlled Vital Signs Assessment: post-procedure vital signs reviewed and stable Respiratory status: spontaneous breathing, nonlabored ventilation, respiratory function stable and patient connected to nasal cannula oxygen Cardiovascular status: blood pressure returned to baseline and stable Postop Assessment: no apparent nausea or vomiting Anesthetic complications: no    Last Vitals:  Vitals:   07/15/18 1615 07/15/18 2000  BP:    Pulse:    Resp:    Temp: 37 C 37.1 C  SpO2:      Last Pain:  Vitals:   07/15/18 2000  TempSrc: Oral  PainSc:                  Kaitlin Vargas

## 2018-07-15 NOTE — Progress Notes (Signed)
PT Cancellation Note  Patient Details Name: Carine Nordgren MRN: [id number] DOB: [birth year]   Cancelled Treatment:    Reason Eval/Treat Not Completed: Patient at procedure or test/unavailable. Pt off the floor at ENDO. Acute PT to return as able.  Kittie Plater, PT, DPT Acute Rehabilitation Services Pager #: [phone] Office #: [phone]    Berline Lopes 07/15/2018, 8:43 AM

## 2018-07-15 NOTE — Progress Notes (Signed)
Subjective: Patient reports some back pain when she got out of bed to walk yesterday but overall feels much better than before surgery.   Objective: Vital signs in last 24 hours: Temp:  [98 F (36.7 C)-99.1 F (37.3 C)] 98.2 F (36.8 C) (02/25 0407) Pulse Rate:  [96-102] 102 (02/25 0407) Resp:  [16-20] 16 (02/25 0407) BP: (108-139)/(62-79) 139/67 (02/25 0407) SpO2:  [99 %] 99 % (02/25 0407)  Intake/Output from previous day: 02/24 0701 - 02/25 0700 In: 3004.6 [P.O.:960; I.V.:1744.6; IV Piggyback:300] Out: 1250 [Urine:1250] Intake/Output this shift: No intake/output data recorded.  Neurologic: Grossly normal  Lab Results: Lab Results  Component Value Date   WBC 6.3 07/14/2018   HGB 9.9 (L) 07/14/2018   HCT 30.9 (L) 07/14/2018   MCV 97.2 07/14/2018   PLT 304 07/14/2018   Lab Results  Component Value Date   INR 1.04 06/17/2018   BMET Lab Results  Component Value Date   NA 131 (L) 07/14/2018   K 3.9 07/14/2018   CL 100 07/14/2018   CO2 25 07/14/2018   GLUCOSE 199 (H) 07/14/2018   BUN 9 07/14/2018   CREATININE 0.45 07/14/2018   CALCIUM 8.1 (L) 07/14/2018    Studies/Results: No results found.  Assessment/Plan: Postop day 4 thoracolumbar fusion. Doing well. abx per ID. Continue therapies.    LOS: 5 days    Ocie Cornfield [facility] 07/15/2018, 8:19 AM

## 2018-07-15 NOTE — Progress Notes (Signed)
[facility]  Date of Admission:  07/10/2018     Total days of antibiotics Cefazolin day 4 from surgery  Patient ID: Kaitlin Vargas is a 68 y.o. 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 aspirin  81 mg Oral q morning - 10a  . bethanechol  10 mg Oral TID  . feeding supplement (GLUCERNA SHAKE)  237 mL Oral TID WC  . feeding supplement (PRO-STAT SUGAR FREE 64)  30 mL Oral BID  . ferrous sulfate  325 mg Oral Q breakfast  . furosemide  40 mg Oral Daily  . insulin aspart  0-15 Units Subcutaneous TID WC  . insulin glargine  20 Units Subcutaneous Daily  . ketoconazole   Topical BID  . lisinopril  10 mg Oral Daily  . metoprolol tartrate  25 mg Oral BID  . morphine  15 mg Oral Q12H  . multivitamin with minerals  1 tablet Oral Daily  . ondansetron  4 mg Oral BID  . pantoprazole  40 mg Oral Daily  . polyethylene glycol  17 g Oral Daily  . rifampin  300 mg Oral Q12H  . senna  1 tablet Oral BID  . sodium chloride flush  10-40 mL Intracatheter Q12H  . sodium chloride flush  3 mL Intravenous Q12H  . vitamin B-12  1,000 mcg Oral Daily    SUBJECTIVE: Back from TEE. Unaware of results. Feeling well but weak in general. She tells me they are planning to send her back to CIR for ongoing rehab.   Review of Systems: Review of Systems  Constitutional: Negative for chills and fever.  HENT: Negative for tinnitus.   Eyes: Negative for blurred vision and photophobia.  Respiratory: Negative for cough and sputum production.   Cardiovascular: Negative for chest pain.  Gastrointestinal: Negative for diarrhea, nausea and vomiting.  Genitourinary: Negative for dysuria.  Musculoskeletal: Positive for back pain (improved).  Skin: Negative for rash.    Neurological: Positive for weakness (generalized). Negative for dizziness, focal weakness and headaches.  PICC line is without pain, drainage or erythema and is well maintained. No swelling or altered sensation in affected distal extremity.    Allergies  Allergen Reactions  . Sulfa Antibiotics Rash    OBJECTIVE: Vitals:   07/15/18 1000 07/15/18 1005 07/15/18 1010 07/15/18 1019  BP: (!) 142/74 (!) 113/53 (!) 148/73 127/62  Pulse: 100 94 99 99  Resp: (!) 21 (!) 25 (!) 21 20  Temp:    98 F (36.7 C)  TempSrc:    Oral  SpO2: 100% 100% 100% 97%  Weight:      Height:       Body mass index is 33.44 kg/m.  Physical Exam HENT:     Mouth/Throat:     Mouth: Mucous membranes are moist. No oral lesions.     Dentition: No dental abscesses.     Pharynx: No oropharyngeal exudate.  Eyes:     General: No scleral icterus.    Pupils: Pupils are equal, round, and reactive to light.  Cardiovascular:     Rate and Rhythm: Normal rate and regular rhythm.     Heart sounds: Normal heart sounds.  Pulmonary:  Effort: Pulmonary effort is normal.     Breath sounds: Normal breath sounds.  Abdominal:     General: There is no distension.     Palpations: Abdomen is soft.     Tenderness: There is no abdominal tenderness.  Musculoskeletal: Normal range of motion.        General: No tenderness.  Lymphadenopathy:     Cervical: No cervical adenopathy.  Skin:    General: Skin is warm and dry.     Findings: No rash.     Comments: PICC line - clean/dry dressing. Insertion site w/o erythema, tenderness, drainage, cording or distal swelling of affected extremity    Neurological:     General: No focal deficit present.     Mental Status: She is alert and oriented to person, place, and time.     Comments: Able to raise both legs to gravity easily and against resistance. Equal strength bilaterally.   Psychiatric:        Judgment: Judgment normal.     Lab Results Lab Results  Component Value Date    WBC 6.3 07/14/2018   HGB 9.9 (L) 07/14/2018   HCT 30.9 (L) 07/14/2018   MCV 97.2 07/14/2018   PLT 304 07/14/2018    Lab Results  Component Value Date   CREATININE 0.45 07/14/2018   BUN 9 07/14/2018   NA 131 (L) 07/14/2018   K 3.9 07/14/2018   CL 100 07/14/2018   CO2 25 07/14/2018    Lab Results  Component Value Date   ALT <5 07/08/2018   AST 14 (L) 07/08/2018   ALKPHOS 121 07/08/2018   BILITOT 0.5 07/08/2018     Microbiology: Recent Results (from the past 240 hour(s))  Urine Culture     Status: Abnormal   Collection Time: 07/06/18  8:37 AM  Result Value Ref Range Status   Specimen Description URINE, RANDOM  Final   Special Requests NONE  Final   Culture (A)  Final    <10,000 COLONIES/mL INSIGNIFICANT GROWTH Performed at [facility], [address] [place], [place] [address]    Report Status 07/07/2018 FINAL  Final  Aerobic/Anaerobic Culture (surgical/deep wound)     Status: None (Preliminary result)   Collection Time: 07/11/18  4:57 PM  Result Value Ref Range Status   Specimen Description TISSUE  Final   Special Requests L1 2 DISC SPACE  Final   Gram Stain   Final    NO WBC SEEN NO ORGANISMS SEEN Performed at [facility], [place] [address], [place], [place] [address]    Culture   Final    RARE STAPHYLOCOCCUS AUREUS CRITICAL RESULT CALLED TO, READ BACK BY AND VERIFIED WITH: C. MITCHELL,  RN CONCERNING CULTURE GROWTH AT 8099 ON 07/12/18 BY C. JESSUP, MLT. NO ANAEROBES ISOLATED; CULTURE IN PROGRESS FOR 5 DAYS    Report Status PENDING  Incomplete   Organism ID, Bacteria STAPHYLOCOCCUS AUREUS  Final      Susceptibility   Staphylococcus aureus - MIC*    CIPROFLOXACIN <=0.5 SENSITIVE Sensitive     ERYTHROMYCIN >=8 RESISTANT Resistant     GENTAMICIN <=0.5 SENSITIVE Sensitive     OXACILLIN 0.5 SENSITIVE Sensitive     TETRACYCLINE <=1 SENSITIVE Sensitive     VANCOMYCIN 1 SENSITIVE Sensitive     TRIMETH/SULFA <=10 SENSITIVE Sensitive     CLINDAMYCIN  <=0.25 SENSITIVE Sensitive     RIFAMPIN <=0.5 SENSITIVE Sensitive     Inducible Clindamycin NEGATIVE Sensitive     *  RARE STAPHYLOCOCCUS AUREUS  Aerobic/Anaerobic Culture (surgical/deep wound)     Status: None (Preliminary result)   Collection Time: 07/11/18  4:58 PM  Result Value Ref Range Status   Specimen Description WOUND  Final   Special Requests L1 2 DISC SPACE  Final   Gram Stain   Final    RARE WBC PRESENT, PREDOMINANTLY PMN NO ORGANISMS SEEN    Culture   Final    NO GROWTH 4 DAYS NO ANAEROBES ISOLATED; CULTURE IN PROGRESS FOR 5 DAYS Performed at [facility] [address] [place], [place] [address]    Report Status PENDING  Incomplete          ASSESSMENT: Kaitlin Vargas is a 68 y.o. female with MSSA discitis and epidural abscess. About 3 weeks into therapy she developed increased pain; repeated imaging with ongoing destruction of L1-2 and developing kyphotic deformity. Due to limited mobility and pain she was taken to OR on 2/21 for debridement of epidural abscess/phlegmon and fusion to stabilize T10-L3. She continues on Rifampin 600 mg QD and Cefazolin IV. Intra-op cultures with MSSA (S-vanc, bactrim, tetracycline).   PLAN: 1. MSSA Bacteremia = TTE negative for any valve dysfunction or evidence of IE. TEE is currently being arranged - however not certain she needs this with no valvular dysfunction on TTE and no other evidence of metastatic infection that would indicate IE. Will d/w Dr. Baxter Flattery more. She previously cleared infection quickly. No evidence of relapsing bacteremia.   2. Aortic Valve Endocarditis = TEE today with small oscillating density on aortic side of aortic valve concerning for vegetation. No symptoms of HF on exam. Would have CT surgery evaluate considering left sided staph aureus endocarditis to ensure no surgical intervention needed at this time. Will need repeat TTE at the end of treatment to re-evaluate and ensure follow up with cardiology team.    3. L1-2 Discitis with epidural abscess = s/p laminectomy and fusion Ronnald Ramp 2/21). Rifampin added d/t hardware insertion during fusion.   4. Medication Monitoring  = no major/significant DDIs with RIF on board. Will need LFTs added for monitoring to her labs (OPAT updated below)   5. Disposition = looks like PT is recommending re-admission back to CIR. She is aggreable and will continue to receive antibiotics there.   OPAT orders as previously outlined on 07/14/18 note  Janene Madeira, MSN, NP-C [facility] Cell: [phone] Pager: [phone]  07/15/2018  2:45 PM

## 2018-07-15 NOTE — Progress Notes (Signed)
    Transesophageal Echocardiogram Note  Kaitlin Vargas [id number] [birth year]  Procedure: Transesophageal Echocardiogram Indications: Bacteremia  Procedure Details Consent: Obtained Time Out: Verified patient identification, verified procedure, site/side was marked, verified correct patient position, special equipment/implants available, Radiology Safety Procedures followed,  medications/allergies/relevent history reviewed, required imaging and test results available.  Performed  Medications:  During this procedure the patient is administered a total of Versed 5 mg and Fentanyl 50 mcg  to achieve and maintain moderate conscious sedation.  The patient's heart rate, blood pressure, and oxygen saturation are monitored continuously during the procedure. The period of conscious sedation is 30 minutes, of which I was present face-to-face 100% of this time.  Normal LV function; small oscillating density on aortic side of aortic valve concerning for vegetation; no AI; likely needs 8 weeks of antibiotics and then repeat TEE.   Complications: No apparent complications Patient did tolerate procedure well.  Kirk Ruths, MD

## 2018-07-15 NOTE — Progress Notes (Addendum)
PROGRESS NOTE    Kaitlin Vargas  NWG:[id number] DOB: [birth year] DOA: 07/10/2018 PCP: [name], MD   Brief Narrative: Kaitlin Vargas is a 68 y.o. femalewith medical history significant ofhypertension, hyperlipidemia, B12 deficiency, type 2 diabetes currently on insulin. Patient is being treated for MSSA bacteremia and developed worsening back pain found to have worsening abscess, s/p surgery per neurosurgery.   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 Transesophageal Echocardiogram significant for possible vegetation. -ID recommendations: Continue Cefazolin and add Rifampin; TCTS consult  Spinal cord abscess S/p lumbar laminectomy, posterior fixation, arthrodesis on 2/21. Cultures obtained on 2/21 with rare staphylococcus aureus with sensitivities available -Neurosurgery recommendations -Antibiotics as mentioned above  Essential hypertension -Continue metoprolol and lisinopril  Tachycardia Sinus. Hemoglobin of 7.6 s/p surgery. Did not improve with blood transfusion. Sinus rhythm on telemetry. Improving. -Continue home metoprolol  Acute blood loss anemia In setting of surgery. S/p 2 units of PRBC on 2/22. Hemoglobin improved from 7.6 to 10.0  Diabetes mellitus, type 2 -Continue SSI -Continue Lantus 20 units; may need to increase if blood sugar continues to be elevated  Increased nutrition needs -Continue protein supplement  Chronic pain Decreased to MS contin to 15 mg BID secondary to confusion  Confusion Appears resolved once decreased MS contin.   DVT prophylaxis: SCDs Code Status:   Code Status: Full Code Family Communication: Husband at  bedside Disposition Plan: Discharge pending infectious workup   Consultants:   Neurosurgery  Infectious disease  Cardiothoracic surgery  Procedures:  1. Decompressive lumbar laminectomy lumbar 1-2 left with hemi-facetectomy and sublaminar decompression and discectomy.  Disc was sent for biopsy and culture; Posterior fixation T10-L3 inclusive using Alphatec cortical pedicle screws; Intertransverse arthrodesis T10-L3 using morcellized autograft and allograft soaked with a bone marrow aspirate obtained through a separate fascial incision over the left iliac crest. (07/11/2018)  Transesophageal Echocardiogram (07/15/2018) IMPRESSIONS    1. The left ventricle has normal systolic function, with an ejection fraction of 55-60%.  2. No evidence of left ventricular regional wall motion abnormalities.  3. The right ventricle has normal systolc function. The cavity was normal.  4. The mitral valve is normal in structure.  5. The tricuspid valve was normal in structure.  6. The aortic valve is tricuspid no stenosis of the aortic valve. A mobile aortic valve vegetation is present.  7. There is evidence of plaque in the descending aorta.  8. Normal LV function; oscillating density on aortic side of aortic valve concerning for vegetation; no AI; suggest 8 weeks of antibiotics and then repeat TEE.  Antimicrobials:  Cefazolin  Rifampin    Subjective: No issues today.  Objective: Vitals:   07/15/18 1000 07/15/18 1005 07/15/18 1010 07/15/18 1019  BP: (!) 142/74 (!) 113/53 (!) 148/73 127/62  Pulse: 100 94 99 99  Resp: (!) 21 (!) 25 (!) 21 20  Temp:    98 F (36.7 C)  TempSrc:    Oral  SpO2: 100% 100% 100% 97%  Weight:      Height:        Intake/Output Summary (Last 24 hours) at 07/15/2018 1420 Last data filed at 07/15/2018 5784 Gross per 24 hour  Intake  834.92 ml  Output 600 ml  Net 234.92 ml   Filed Weights   07/11/18 1433  Weight: 72.6 kg    Examination:  General exam:  Appears calm and comfortable Respiratory system: Clear to auscultation. Respiratory effort normal. Cardiovascular system: S1 & S2 heard, RRR. No murmurs, rubs, gallops or clicks. Gastrointestinal system: Abdomen is nondistended, soft and nontender. No organomegaly or masses felt. Normal bowel sounds heard. Central nervous system: Alert and oriented. No focal neurological deficits. Extremities: No edema. No calf tenderness Skin: No cyanosis. No rashes Psychiatry: Judgement and insight appear normal. Mood & affect appropriate.    Data Reviewed: I have personally reviewed following labs and imaging studies  CBC: Recent Labs  Lab 07/12/18 0722 07/13/18 0613 07/14/18 0755  WBC 6.1 6.7 6.3  HGB 7.6* 10.0* 9.9*  HCT 25.6* 30.9* 30.9*  MCV 104.5* 95.7 97.2  PLT 301 288 643   Basic Metabolic Panel: Recent Labs  Lab 07/12/18 1143 07/13/18 0950 07/14/18 0755  NA 130* 129* 131*  K 4.5 4.4 3.9  CL 94* 98 100  CO2 27 24 25   GLUCOSE 439* 323* 199*  BUN 17 14 9   CREATININE 0.65 0.56 0.45  CALCIUM 8.3* 8.0* 8.1*   GFR: Estimated Creatinine Clearance: 56.2 mL/min (by C-G formula based on SCr of 0.45 mg/dL). Liver Function Tests: No results for input(s): AST, ALT, ALKPHOS, BILITOT, PROT, ALBUMIN in the last 168 hours. No results for input(s): LIPASE, AMYLASE in the last 168 hours. Recent Labs  Lab 07/14/18 0755  AMMONIA 21   Coagulation Profile: No results for input(s): INR, PROTIME in the last 168 hours. Cardiac Enzymes: No results for input(s): CKTOTAL, CKMB, CKMBINDEX, TROPONINI in the last 168 hours. BNP (last 3 results) No results for input(s): PROBNP in the last 8760 hours. HbA1C: No results for input(s): HGBA1C in the last 72 hours. CBG: Recent Labs  Lab 07/14/18 1136 07/14/18 1544 07/14/18 2123 07/15/18 0849 07/15/18 1121  GLUCAP 198* 268* 114* 208* 194*   Lipid Profile: No results for input(s): CHOL, HDL, LDLCALC, TRIG, CHOLHDL, LDLDIRECT in the last 72  hours. Thyroid Function Tests: No results for input(s): TSH, T4TOTAL, FREET4, T3FREE, THYROIDAB in the last 72 hours. Anemia Panel: No results for input(s): VITAMINB12, FOLATE, FERRITIN, TIBC, IRON, RETICCTPCT in the last 72 hours. Sepsis Labs: No results for input(s): PROCALCITON, LATICACIDVEN in the last 168 hours.  Recent Results (from the past 240 hour(s))  Urine Culture     Status: Abnormal   Collection Time: 07/06/18  8:37 AM  Result Value Ref Range Status   Specimen Description URINE, RANDOM  Final   Special Requests NONE  Final   Culture (A)  Final    <10,000 COLONIES/mL INSIGNIFICANT GROWTH Performed at [facility], [address], [place], [place] [address]    Report Status 07/07/2018 FINAL  Final  Aerobic/Anaerobic Culture (surgical/deep wound)     Status: None (Preliminary result)   Collection Time: 07/11/18  4:57 PM  Result Value Ref Range Status   Specimen Description TISSUE  Final   Special Requests L1 2 DISC SPACE  Final   Gram Stain   Final    NO WBC SEEN NO ORGANISMS SEEN Performed at [facility], [place] [address], [place], [place] [address]    Culture   Final    RARE STAPHYLOCOCCUS AUREUS CRITICAL RESULT CALLED TO, READ BACK BY AND VERIFIED WITH: C. MITCHELL,  RN CONCERNING CULTURE GROWTH AT 6063 ON 07/12/18 BY C. JESSUP, MLT.  NO ANAEROBES ISOLATED; CULTURE IN PROGRESS FOR 5 DAYS    Report Status PENDING  Incomplete   Organism ID, Bacteria STAPHYLOCOCCUS AUREUS  Final      Susceptibility   Staphylococcus aureus - MIC*    CIPROFLOXACIN <=0.5 SENSITIVE Sensitive     ERYTHROMYCIN >=8 RESISTANT Resistant     GENTAMICIN <=0.5 SENSITIVE Sensitive     OXACILLIN 0.5 SENSITIVE Sensitive     TETRACYCLINE <=1 SENSITIVE Sensitive     VANCOMYCIN 1 SENSITIVE Sensitive     TRIMETH/SULFA <=10 SENSITIVE Sensitive     CLINDAMYCIN <=0.25 SENSITIVE Sensitive     RIFAMPIN <=0.5 SENSITIVE Sensitive     Inducible Clindamycin NEGATIVE Sensitive     * RARE  STAPHYLOCOCCUS AUREUS  Aerobic/Anaerobic Culture (surgical/deep wound)     Status: None (Preliminary result)   Collection Time: 07/11/18  4:58 PM  Result Value Ref Range Status   Specimen Description WOUND  Final   Special Requests L1 2 DISC SPACE  Final   Gram Stain   Final    RARE WBC PRESENT, PREDOMINANTLY PMN NO ORGANISMS SEEN    Culture   Final    NO GROWTH 4 DAYS NO ANAEROBES ISOLATED; CULTURE IN PROGRESS FOR 5 DAYS Performed at [facility], [address], [place], [place] [address]    Report Status PENDING  Incomplete         Radiology Studies: No results found.      Scheduled Meds: . aspirin  81 mg Oral q morning - 10a  . bethanechol  10 mg Oral TID  . feeding supplement (GLUCERNA SHAKE)  237 mL Oral TID WC  . feeding supplement (PRO-STAT SUGAR FREE 64)  30 mL Oral BID  . ferrous sulfate  325 mg Oral Q breakfast  . furosemide  40 mg Oral Daily  . insulin aspart  0-15 Units Subcutaneous TID WC  . insulin glargine  20 Units Subcutaneous Daily  . ketoconazole   Topical BID  . lisinopril  10 mg Oral Daily  . metoprolol tartrate  25 mg Oral BID  . morphine  15 mg Oral Q12H  . multivitamin with minerals  1 tablet Oral Daily  . ondansetron  4 mg Oral BID  . pantoprazole  40 mg Oral Daily  . polyethylene glycol  17 g Oral Daily  . rifampin  300 mg Oral Q12H  . senna  1 tablet Oral BID  . sodium chloride flush  10-40 mL Intracatheter Q12H  . sodium chloride flush  3 mL Intravenous Q12H  . vitamin B-12  1,000 mcg Oral Daily   Continuous Infusions: . sodium chloride    . 0.9 % NaCl with KCl 20 mEq / L 50 mL/hr at 07/14/18 1429  .  ceFAZolin (ANCEF) IV 2 g (07/15/18 1322)  . methocarbamol (ROBAXIN) IV       LOS: 5 days     Cordelia Poche, MD [facility] 07/15/2018, 2:20 PM  If 7PM-7AM, please contact night-coverage [email or web address]

## 2018-07-15 NOTE — Care Management Important Message (Signed)
Important Message  Patient Details  Name: Kaitlin Vargas MRN: [id number] Date of Birth: 20-Oct-1950   Medicare Important Message Given:  Yes    Rishita Petron [address] 07/15/2018, 3:48 PM

## 2018-07-16 ENCOUNTER — Inpatient Hospital Stay (HOSPITAL_COMMUNITY)
Admission: RE | Admit: 2018-07-16 | Discharge: 2018-07-29 | DRG: 559 | Disposition: A | Discharge status: Home Health | Payer: Medicare HMO | Source: Intra-hospital | Attending: Physical Medicine & Rehabilitation | Admitting: Physical Medicine & Rehabilitation

## 2018-07-16 ENCOUNTER — Other Ambulatory Visit: Payer: Self-pay

## 2018-07-16 ENCOUNTER — Encounter (HOSPITAL_COMMUNITY): Payer: Self-pay | Admitting: *Deleted

## 2018-07-16 DIAGNOSIS — Z981 Arthrodesis status: Secondary | ICD-10-CM | POA: Diagnosis not present

## 2018-07-16 DIAGNOSIS — G062 Extradural and subdural abscess, unspecified: Secondary | ICD-10-CM | POA: Diagnosis present

## 2018-07-16 DIAGNOSIS — D509 Iron deficiency anemia, unspecified: Secondary | ICD-10-CM | POA: Diagnosis present

## 2018-07-16 DIAGNOSIS — R32 Unspecified urinary incontinence: Secondary | ICD-10-CM | POA: Diagnosis present

## 2018-07-16 DIAGNOSIS — B9561 Methicillin susceptible Staphylococcus aureus infection as the cause of diseases classified elsewhere: Secondary | ICD-10-CM | POA: Diagnosis present

## 2018-07-16 DIAGNOSIS — M4646 Discitis, unspecified, lumbar region: Secondary | ICD-10-CM | POA: Diagnosis present

## 2018-07-16 DIAGNOSIS — E871 Hypo-osmolality and hyponatremia: Secondary | ICD-10-CM | POA: Diagnosis present

## 2018-07-16 DIAGNOSIS — D649 Anemia, unspecified: Secondary | ICD-10-CM

## 2018-07-16 DIAGNOSIS — E1169 Type 2 diabetes mellitus with other specified complication: Secondary | ICD-10-CM

## 2018-07-16 DIAGNOSIS — I350 Nonrheumatic aortic (valve) stenosis: Secondary | ICD-10-CM

## 2018-07-16 DIAGNOSIS — E1165 Type 2 diabetes mellitus with hyperglycemia: Secondary | ICD-10-CM | POA: Diagnosis present

## 2018-07-16 DIAGNOSIS — E538 Deficiency of other specified B group vitamins: Secondary | ICD-10-CM

## 2018-07-16 DIAGNOSIS — K5903 Drug induced constipation: Secondary | ICD-10-CM | POA: Diagnosis not present

## 2018-07-16 DIAGNOSIS — R2689 Other abnormalities of gait and mobility: Secondary | ICD-10-CM | POA: Diagnosis present

## 2018-07-16 DIAGNOSIS — E46 Unspecified protein-calorie malnutrition: Secondary | ICD-10-CM

## 2018-07-16 DIAGNOSIS — I10 Essential (primary) hypertension: Secondary | ICD-10-CM | POA: Diagnosis present

## 2018-07-16 DIAGNOSIS — E1142 Type 2 diabetes mellitus with diabetic polyneuropathy: Secondary | ICD-10-CM

## 2018-07-16 DIAGNOSIS — R7881 Bacteremia: Secondary | ICD-10-CM | POA: Diagnosis present

## 2018-07-16 DIAGNOSIS — K5900 Constipation, unspecified: Secondary | ICD-10-CM | POA: Diagnosis present

## 2018-07-16 DIAGNOSIS — Z882 Allergy status to sulfonamides status: Secondary | ICD-10-CM | POA: Diagnosis not present

## 2018-07-16 DIAGNOSIS — R339 Retention of urine, unspecified: Secondary | ICD-10-CM | POA: Diagnosis present

## 2018-07-16 DIAGNOSIS — E669 Obesity, unspecified: Secondary | ICD-10-CM | POA: Diagnosis present

## 2018-07-16 DIAGNOSIS — Z4789 Encounter for other orthopedic aftercare: Secondary | ICD-10-CM | POA: Diagnosis present

## 2018-07-16 DIAGNOSIS — Z6832 Body mass index (BMI) 32.0-32.9, adult: Secondary | ICD-10-CM

## 2018-07-16 DIAGNOSIS — R159 Full incontinence of feces: Secondary | ICD-10-CM | POA: Diagnosis present

## 2018-07-16 DIAGNOSIS — R195 Other fecal abnormalities: Secondary | ICD-10-CM | POA: Diagnosis not present

## 2018-07-16 DIAGNOSIS — L97401 Non-pressure chronic ulcer of unspecified heel and midfoot limited to breakdown of skin: Secondary | ICD-10-CM

## 2018-07-16 DIAGNOSIS — G8918 Other acute postprocedural pain: Secondary | ICD-10-CM

## 2018-07-16 DIAGNOSIS — E785 Hyperlipidemia, unspecified: Secondary | ICD-10-CM

## 2018-07-16 DIAGNOSIS — E08621 Diabetes mellitus due to underlying condition with foot ulcer: Secondary | ICD-10-CM

## 2018-07-16 DIAGNOSIS — M4626 Osteomyelitis of vertebra, lumbar region: Secondary | ICD-10-CM | POA: Diagnosis present

## 2018-07-16 DIAGNOSIS — E119 Type 2 diabetes mellitus without complications: Secondary | ICD-10-CM

## 2018-07-16 DIAGNOSIS — R600 Localized edema: Secondary | ICD-10-CM | POA: Diagnosis present

## 2018-07-16 DIAGNOSIS — Z8674 Personal history of sudden cardiac arrest: Secondary | ICD-10-CM

## 2018-07-16 DIAGNOSIS — I339 Acute and subacute endocarditis, unspecified: Secondary | ICD-10-CM

## 2018-07-16 DIAGNOSIS — E8809 Other disorders of plasma-protein metabolism, not elsewhere classified: Secondary | ICD-10-CM | POA: Diagnosis present

## 2018-07-16 LAB — AEROBIC/ANAEROBIC CULTURE W GRAM STAIN (SURGICAL/DEEP WOUND): Gram Stain: NONE SEEN

## 2018-07-16 LAB — AEROBIC/ANAEROBIC CULTURE (SURGICAL/DEEP WOUND): CULTURE: NO GROWTH

## 2018-07-16 LAB — GLUCOSE, CAPILLARY
Glucose-Capillary: 179 mg/dL — ABNORMAL HIGH (ref 70–99)
Glucose-Capillary: 138 mg/dL — ABNORMAL HIGH (ref 70–99)
Glucose-Capillary: 185 mg/dL — ABNORMAL HIGH (ref 70–99)

## 2018-07-16 MED ORDER — MORPHINE SULFATE ER 15 MG PO TBCR: 15.0000 mg | EXTENDED_RELEASE_TABLET | Freq: Two times a day (BID) | ORAL | 0 refills | Status: DC

## 2018-07-16 MED ORDER — ASPIRIN 81 MG PO CHEW
81.0000 mg | CHEWABLE_TABLET | Freq: Every day | ORAL | Status: DC
  Administered 2018-07-17 – 2018-07-29 (×13): 81 mg via ORAL
  Filled 2018-07-16 (×13): qty 1

## 2018-07-16 MED ORDER — VITAMIN B-12 1000 MCG PO TABS
1000.0000 ug | ORAL_TABLET | Freq: Every day | ORAL | Status: DC
  Administered 2018-07-17 – 2018-07-29 (×13): 1000 ug via ORAL
  Filled 2018-07-16 (×13): qty 1

## 2018-07-16 MED ORDER — ALTEPLASE 2 MG IJ SOLR
2.0000 mg | Freq: Once | INTRAMUSCULAR | Status: AC
  Administered 2018-07-16: 2 mg
  Filled 2018-07-16: qty 2

## 2018-07-16 MED ORDER — FLEET ENEMA 7-19 GM/118ML RE ENEM: 1.0000 | ENEMA | Freq: Every day | RECTAL | Status: DC | PRN

## 2018-07-16 MED ORDER — METHOCARBAMOL 1000 MG/10ML IJ SOLN
500.0000 mg | Freq: Four times a day (QID) | INTRAVENOUS | Status: DC | PRN
  Filled 2018-07-16: qty 5

## 2018-07-16 MED ORDER — SENNA 8.6 MG PO TABS
1.0000 | ORAL_TABLET | Freq: Two times a day (BID) | ORAL | Status: DC
  Administered 2018-07-16 – 2018-07-17 (×2): 8.6 mg via ORAL
  Filled 2018-07-16 (×2): qty 1

## 2018-07-16 MED ORDER — MORPHINE SULFATE ER 15 MG PO TBCR
15.0000 mg | EXTENDED_RELEASE_TABLET | Freq: Two times a day (BID) | ORAL | Status: DC
  Administered 2018-07-16 – 2018-07-22 (×13): 15 mg via ORAL
  Filled 2018-07-16 (×14): qty 1

## 2018-07-16 MED ORDER — ALBUTEROL SULFATE (2.5 MG/3ML) 0.083% IN NEBU: 2.5000 mg | INHALATION_SOLUTION | RESPIRATORY_TRACT | Status: DC | PRN

## 2018-07-16 MED ORDER — PRO-STAT SUGAR FREE PO LIQD
30.0000 mL | Freq: Two times a day (BID) | ORAL | Status: DC
  Administered 2018-07-16 – 2018-07-29 (×26): 30 mL via ORAL
  Filled 2018-07-16 (×27): qty 30

## 2018-07-16 MED ORDER — POLYETHYLENE GLYCOL 3350 17 G PO PACK: 17.0000 g | PACK | Freq: Two times a day (BID) | ORAL | Status: DC

## 2018-07-16 MED ORDER — METHOCARBAMOL 500 MG PO TABS: 500.0000 mg | ORAL_TABLET | Freq: Four times a day (QID) | ORAL | Status: DC | PRN

## 2018-07-16 MED ORDER — PANTOPRAZOLE SODIUM 40 MG PO TBEC
40.0000 mg | DELAYED_RELEASE_TABLET | Freq: Every day | ORAL | Status: DC
  Administered 2018-07-17 – 2018-07-29 (×13): 40 mg via ORAL
  Filled 2018-07-16 (×13): qty 1

## 2018-07-16 MED ORDER — POLYETHYLENE GLYCOL 3350 17 G PO PACK
17.0000 g | PACK | Freq: Two times a day (BID) | ORAL | Status: DC
  Administered 2018-07-16 – 2018-07-21 (×10): 17 g via ORAL
  Filled 2018-07-16 (×10): qty 1

## 2018-07-16 MED ORDER — ACETAMINOPHEN 650 MG RE SUPP: 650.0000 mg | RECTAL | Status: DC | PRN

## 2018-07-16 MED ORDER — FERROUS SULFATE 325 (65 FE) MG PO TABS: 325.0000 mg | ORAL_TABLET | Freq: Every day | ORAL | 3 refills | Status: DC

## 2018-07-16 MED ORDER — RIFAMPIN 300 MG PO CAPS
300.0000 mg | ORAL_CAPSULE | Freq: Three times a day (TID) | ORAL | Status: DC
  Administered 2018-07-16: 300 mg via ORAL
  Filled 2018-07-16 (×2): qty 1

## 2018-07-16 MED ORDER — LISINOPRIL 10 MG PO TABS
10.0000 mg | ORAL_TABLET | Freq: Every day | ORAL | Status: DC
  Administered 2018-07-17 – 2018-07-29 (×13): 10 mg via ORAL
  Filled 2018-07-16 (×13): qty 1

## 2018-07-16 MED ORDER — BETHANECHOL CHLORIDE 10 MG PO TABS
10.0000 mg | ORAL_TABLET | Freq: Three times a day (TID) | ORAL | Status: DC
  Administered 2018-07-16 – 2018-07-23 (×22): 10 mg via ORAL
  Filled 2018-07-16 (×22): qty 1

## 2018-07-16 MED ORDER — FUROSEMIDE 40 MG PO TABS
40.0000 mg | ORAL_TABLET | Freq: Every day | ORAL | Status: DC
  Administered 2018-07-16 – 2018-07-29 (×14): 40 mg via ORAL
  Filled 2018-07-16 (×14): qty 1

## 2018-07-16 MED ORDER — BISACODYL 10 MG RE SUPP: 10.0000 mg | Freq: Every day | RECTAL | Status: DC | PRN

## 2018-07-16 MED ORDER — METHOCARBAMOL 500 MG PO TABS
500.0000 mg | ORAL_TABLET | Freq: Four times a day (QID) | ORAL | Status: DC | PRN
  Administered 2018-07-16 – 2018-07-28 (×8): 500 mg via ORAL
  Filled 2018-07-16 (×8): qty 1

## 2018-07-16 MED ORDER — SODIUM CHLORIDE 0.9% FLUSH
10.0000 mL | INTRAVENOUS | Status: DC | PRN
  Administered 2018-07-18 – 2018-07-29 (×5): 10 mL
  Filled 2018-07-16 (×5): qty 40

## 2018-07-16 MED ORDER — INSULIN ASPART 100 UNIT/ML ~~LOC~~ SOLN
0.0000 [IU] | Freq: Three times a day (TID) | SUBCUTANEOUS | Status: DC
  Administered 2018-07-17: 5 [IU] via SUBCUTANEOUS
  Administered 2018-07-17 – 2018-07-18 (×2): 2 [IU] via SUBCUTANEOUS
  Administered 2018-07-18 – 2018-07-19 (×2): 3 [IU] via SUBCUTANEOUS
  Administered 2018-07-19: 5 [IU] via SUBCUTANEOUS
  Administered 2018-07-20: 3 [IU] via SUBCUTANEOUS
  Administered 2018-07-20: 2 [IU] via SUBCUTANEOUS
  Administered 2018-07-20: 5 [IU] via SUBCUTANEOUS
  Administered 2018-07-21: 8 [IU] via SUBCUTANEOUS
  Administered 2018-07-21: 2 [IU] via SUBCUTANEOUS
  Administered 2018-07-22 – 2018-07-23 (×4): 3 [IU] via SUBCUTANEOUS
  Administered 2018-07-23: 8 [IU] via SUBCUTANEOUS
  Administered 2018-07-23: 3 [IU] via SUBCUTANEOUS
  Administered 2018-07-24: 5 [IU] via SUBCUTANEOUS
  Administered 2018-07-24: 3 [IU] via SUBCUTANEOUS
  Administered 2018-07-24: 11 [IU] via SUBCUTANEOUS
  Administered 2018-07-25: 8 [IU] via SUBCUTANEOUS
  Administered 2018-07-25: 3 [IU] via SUBCUTANEOUS
  Administered 2018-07-25: 5 [IU] via SUBCUTANEOUS
  Administered 2018-07-26: 8 [IU] via SUBCUTANEOUS
  Administered 2018-07-26: 3 [IU] via SUBCUTANEOUS
  Administered 2018-07-26: 5 [IU] via SUBCUTANEOUS
  Administered 2018-07-27: 8 [IU] via SUBCUTANEOUS
  Administered 2018-07-27: 15 [IU] via SUBCUTANEOUS
  Administered 2018-07-27 – 2018-07-28 (×2): 5 [IU] via SUBCUTANEOUS
  Administered 2018-07-28 – 2018-07-29 (×3): 2 [IU] via SUBCUTANEOUS

## 2018-07-16 MED ORDER — ONDANSETRON HCL 4 MG/2ML IJ SOLN: 4.0000 mg | Freq: Four times a day (QID) | INTRAMUSCULAR | Status: DC | PRN

## 2018-07-16 MED ORDER — HYDROCODONE-ACETAMINOPHEN 7.5-325 MG PO TABS
1.0000 | ORAL_TABLET | Freq: Four times a day (QID) | ORAL | Status: DC | PRN
  Administered 2018-07-17 – 2018-07-28 (×23): 1 via ORAL
  Filled 2018-07-16 (×23): qty 1

## 2018-07-16 MED ORDER — INSULIN GLARGINE 100 UNIT/ML ~~LOC~~ SOLN
20.0000 [IU] | Freq: Every day | SUBCUTANEOUS | Status: DC
  Administered 2018-07-17 – 2018-07-21 (×5): 20 [IU] via SUBCUTANEOUS
  Filled 2018-07-16 (×5): qty 0.2

## 2018-07-16 MED ORDER — GLUCERNA SHAKE PO LIQD
237.0000 mL | Freq: Three times a day (TID) | ORAL | Status: DC
  Administered 2018-07-16 – 2018-07-29 (×31): 237 mL via ORAL

## 2018-07-16 MED ORDER — SORBITOL 70 % SOLN
30.0000 mL | Freq: Every day | Status: DC | PRN
  Administered 2018-07-27: 30 mL via ORAL
  Filled 2018-07-16 (×3): qty 30

## 2018-07-16 MED ORDER — FERROUS SULFATE 325 (65 FE) MG PO TABS
325.0000 mg | ORAL_TABLET | Freq: Every day | ORAL | Status: DC
  Administered 2018-07-17 – 2018-07-29 (×13): 325 mg via ORAL
  Filled 2018-07-16 (×13): qty 1

## 2018-07-16 MED ORDER — ONDANSETRON HCL 4 MG PO TABS: 4.0000 mg | ORAL_TABLET | Freq: Four times a day (QID) | ORAL | Status: DC | PRN

## 2018-07-16 MED ORDER — CEFAZOLIN SODIUM-DEXTROSE 2-4 GM/100ML-% IV SOLN
2.0000 g | Freq: Three times a day (TID) | INTRAVENOUS | Status: DC
  Administered 2018-07-17 – 2018-07-29 (×38): 2 g via INTRAVENOUS
  Filled 2018-07-16 (×39): qty 100

## 2018-07-16 MED ORDER — KETOCONAZOLE 2 % EX CREA
TOPICAL_CREAM | Freq: Two times a day (BID) | CUTANEOUS | Status: DC
  Administered 2018-07-16 – 2018-07-20 (×7): via TOPICAL
  Administered 2018-07-20: 1 via TOPICAL
  Administered 2018-07-21 (×2): via TOPICAL
  Administered 2018-07-22: 1 via TOPICAL
  Administered 2018-07-22 – 2018-07-27 (×10): via TOPICAL
  Administered 2018-07-27: 1 via TOPICAL
  Administered 2018-07-28 – 2018-07-29 (×3): via TOPICAL
  Filled 2018-07-16: qty 15

## 2018-07-16 MED ORDER — SODIUM CHLORIDE 0.9% FLUSH
10.0000 mL | Freq: Two times a day (BID) | INTRAVENOUS | Status: DC
  Administered 2018-07-17 – 2018-07-28 (×22): 10 mL

## 2018-07-16 MED ORDER — ACETAMINOPHEN 325 MG PO TABS
650.0000 mg | ORAL_TABLET | ORAL | Status: DC | PRN
  Administered 2018-07-16 – 2018-07-23 (×2): 650 mg via ORAL
  Filled 2018-07-16 (×3): qty 2

## 2018-07-16 MED ORDER — METOPROLOL TARTRATE 25 MG PO TABS
25.0000 mg | ORAL_TABLET | Freq: Two times a day (BID) | ORAL | Status: DC
  Administered 2018-07-16 – 2018-07-29 (×26): 25 mg via ORAL
  Filled 2018-07-16 (×26): qty 1

## 2018-07-16 MED ORDER — RIFAMPIN 300 MG PO CAPS: 300.0000 mg | ORAL_CAPSULE | Freq: Three times a day (TID) | ORAL | 0 refills | Status: DC

## 2018-07-16 MED ORDER — RIFAMPIN 300 MG PO CAPS
300.0000 mg | ORAL_CAPSULE | Freq: Three times a day (TID) | ORAL | Status: DC
  Administered 2018-07-16 – 2018-07-29 (×38): 300 mg via ORAL
  Filled 2018-07-16 (×39): qty 1

## 2018-07-16 MED ORDER — FLEET ENEMA 7-19 GM/118ML RE ENEM: 1.0000 | ENEMA | Freq: Every day | RECTAL | 0 refills | Status: DC | PRN

## 2018-07-16 MED ORDER — ADULT MULTIVITAMIN W/MINERALS CH
1.0000 | ORAL_TABLET | Freq: Every day | ORAL | Status: DC
  Administered 2018-07-17 – 2018-07-29 (×13): 1 via ORAL
  Filled 2018-07-16 (×13): qty 1

## 2018-07-16 MED ORDER — FLEET ENEMA 7-19 GM/118ML RE ENEM
1.0000 | ENEMA | Freq: Every day | RECTAL | Status: DC | PRN
  Administered 2018-07-17 – 2018-07-20 (×2): 1 via RECTAL
  Filled 2018-07-16 (×2): qty 1

## 2018-07-16 MED ORDER — CEFAZOLIN IV (FOR PTA / DISCHARGE USE ONLY): 2.0000 g | Freq: Three times a day (TID) | INTRAVENOUS | 0 refills | Status: AC

## 2018-07-16 NOTE — IPOC Note (Signed)
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  [facility] Problems: Active Problems:   MSSA bacteremia   S/P lumbar fusion   Drug induced constipation   Hypoalbuminemia due to protein-calorie malnutrition ([place])   Diabetes mellitus type 2 in obese Kaitlin [facility])     Functional Problem List: Nursing Bladder, Bowel, Edema, Endurance, Medication Management, Pain, Skin Integrity  PT Balance, Edema, Endurance, Sensory, Motor  OT Balance, Endurance, Motor, Pain, Sensory  SLP    TR         Basic ADL's: OT Grooming, Bathing, Dressing, Toileting, Eating     Advanced  ADL's: OT       Transfers: PT Bed Mobility, Bed to Chair, Car, Manufacturing systems engineer, Metallurgist: PT Emergency planning/management officer, Ambulation     Additional Impairments: OT None  SLP        TR      Anticipated Outcomes Item Anticipated Outcome  Self Feeding Independent  Swallowing      Basic self-care  Media planner Transfers Supervision  Bowel/Bladder  manage b/b with min A  Transfers  supervision  Locomotion  supervision  Communication     Cognition     Pain  maintain pain <3  Safety/Judgment  follow safety plan throughout pt. stay   Therapy Plan: PT Intensity: Minimum of 1-2 x/Kaitlin ,45 to 90 minutes PT Frequency: 5 out of 7 days PT Duration Estimated Length of Stay: 14 OT Intensity: Minimum of 1-2 x/Kaitlin, 45 to 90 minutes OT Frequency: 5 out of 7 days OT Duration/Estimated Length of Stay: 2 weeks      Team Interventions: Nursing Interventions Patient/Family Education, Pain Management, Discharge Planning, Medication Management, Bowel Management, Bladder Management, Skin Care/Wound Management, Psychosocial Support, Disease Management/Prevention  PT interventions Ambulation/gait training, Community reintegration, DME/adaptive  equipment instruction, Neuromuscular re-education, Stair training, UE/LE Strength taining/ROM, Wheelchair propulsion/positioning, Training and development officer, Discharge planning, Pain management, Therapeutic Activities, UE/LE Coordination activities, Functional mobility training, Patient/family education, Splinting/orthotics, Therapeutic Exercise  OT Interventions Training and development officer, Community reintegration, Discharge planning, Disease mangement/prevention, Engineer, drilling, Functional mobility training, Neuromuscular re-education, Pain management, Patient/family education, Psychosocial support, Self Care/advanced ADL retraining, Therapeutic Activities, Therapeutic Exercise, UE/LE Strength taining/ROM, UE/LE Coordination activities, Wheelchair propulsion/positioning  SLP Interventions    TR Interventions    SW/CM Interventions Discharge Planning, Psychosocial Support, Patient/Family Education   Barriers to Discharge MD  Medical stability and Wound care  Nursing IV antibiotics, Medical stability, Incontinence, Wound Care, Lack of/limited family support, Decreased caregiver support    PT      OT      SLP      SW       Team Discharge Planning: Destination: PT-Home ,OT- Home , SLP-  Projected Follow-up: PT-Home health PT, OT-  Home health OT, SLP-  Projected Equipment Needs: PT-To be determined, OT- To be determined, SLP-  Equipment Details: PT- , OT-Likely a tub transfer bench Patient/family involved in discharge planning: PT- Patient,  OT-Patient, SLP-   MD ELOS: 10-14 days. Medical Rehab Prognosis:  Good Assessment: 68 year old right-handed female with history of hypertension, hyperlipidemia, B12 deficiency and diabetes mellitus. Patient with reported back pain 4 weeks. She recently came to the [facility] with cough diagnosed with adenovirus in mid-January received antibiotic coverage recovering nicely was discharged to home. She continued to have back pain  radiating to the lower extremities as well as bouts of  urinary incontinence and stool incontinence. She presented to [facility] on 06/16/2018 with increasing back pain with lower extremity weakness and a CT scan of the spine showed what appeared to be osteomyelitis. She was transferred to [place] [facility] for interventional radiology completed lumbar puncture and during the procedure noted increase active bleeding as well as cardiac arrest requiring CPR 90 seconds. She did not require intubation. It was felt cardiac arrest likely due to respiratory arrest following sedation. 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 Patient also with right foot ulceration. MRI of the foot negative for acute bony or joint abnormality. Small volume of fluid in the subcutaneous tissue deep to the first TMT joint. Infectious disease consulted patient with GPCs growing from lumbar spine aspirate. Placed on antibiotic therapy. TEE negative for vegetation. She had been placed on Cefazolin per infectious disease through 08/13/2018. Subcutaneous heparin for DVT prophylaxis. [facility] course of pain management maintained on long-acting MS Contin. Acute on chronic anemia 7.6 transfused 2 units of packed red blood cells 07/12/2018 and monitored closely initial bouts of hyponatremia 130. Therapy evaluations completed and patient was admitted for a comprehensive rehabilitation program to 06/26/2018. Patient was progressing nicely and developed increasing back pain 07/09/2018. 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 Also showed prevertebral abscess. Infectious disease as well as neurosurgery consulted and plan was to discharge to acute care services for ongoing care 07/10/2018. After long discussion between neurosurgery and infectious disease it was decided to explore  abscess as patient appeared not to be responding to antibiotics and underwent decompressive lumbar laminectomy lumbar 1-2 left with hemi-facetectomy and sublaminar decompression and discectomy. Disc was sent for biopsy and culture. Posterior fixation T10-L3 using cortical pedicle screws. Intertransverse arthrodesis T10-L3 with bone marrow aspirate obtained through a separate fascial incision over the left iliac crest 07/11/2018 per Dr. Sherley Bounds. Back brace when out of bed applied in sitting position.Pain management with the use of MS Contin monitoring mental status for confusion. Infectious disease follow-up current plan is for 8 weeks of IV cefazolin plus oral rifampin 300 mg twice a Kaitlin from date of surgery 07/11/2018. TEE completed 07/15/2018 to rule out endocarditis showed a small oscillating density on the aortic side of aortic valve concerning for vegetation.  No further intervention and advised to continue antibiotic therapy as directed.follow-up cardiothoracic surgery in regards to aortic valve endocarditis no plan for surgical intervention. Patient with bouts of urinary retention currently maintained on Urecholine.  Patient with resulting functional deficits with mobility, self-care, endurance.  We will set goals for Supervision with PT/OT.  See Team Conference Notes for weekly updates to the plan of care

## 2018-07-16 NOTE — Progress Notes (Signed)
Physical Therapy Treatment Patient Details Name: Kaitlin [facility]argas MRN: [id number] DOB: [birth year] Today's Date: 07/16/2018    History of Present Illness Pt is a 68 y.o. female who was admitted to [facility] 06/16/18 with back pain. 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 She transferred to [facility] for lumbar puncture on 1/29; during procedure, pt with active bleeding and cardiac arrest requiring CPR.  On 06/26/2018 she was transferred to inpatient rehab to continue inpatient rehab with IV antibiotics and PICC line on right arm. Abscess not responding to antibiotics. Pt with continued pain and inability to ambulate. On 07/10/18 pt re-admitted to acute care. She underwent posterior fixation T10-L3 07/11/18.  Post op anemia requiring PRBCs on 07/12/18.  PMH includes peripheral neuropathy, HTN, DM.      PT Comments    Pt progressing well and demonstrated significant improvement both cognitively and functionally. Pt with incresaed ambulation tolerance and level of alertness and orientation. Pt cont to require assist for hygiene s/p urination and to don mesh underwear and pad. Pt did place pad in underwear and try to pull them up in sitting. Pt with impaired balance limiting ability to perform hygiene. Acute PT to cont to follow.    Follow Up Recommendations  CIR     Equipment Recommendations  3in1 (PT);Wheelchair (measurements PT);Wheelchair cushion (measurements PT)    Recommendations for Other Services Rehab consult     Precautions / Restrictions Precautions Precautions: Fall;Back Precaution Booklet Issued: Yes (comment) Precaution Comments: reviewed Required Braces or Orthoses: Spinal Brace Spinal Brace: Applied in sitting position Restrictions Weight Bearing Restrictions: No    Mobility  Bed Mobility Overal bed mobility: Needs Assistance Bed Mobility: Rolling;Sidelying to Sit;Sit to Sidelying Rolling: Min assist Sidelying to sit: Min assist       General bed mobility  comments: used bed rail, assist for trunk elevation  Transfers Overall transfer level: Needs assistance Equipment used: Rolling walker (2 wheeled) Transfers: Sit to/from Stand Sit to Stand: Mod assist Stand pivot transfers: Mod assist       General transfer comment: modA to power up, max directional verbal cues to complete std pvt to [facility], minA for walker management, verbal cues to push up from bed not pull up on walker  Ambulation/Gait Ambulation/Gait assistance: Mod assist;+2 safety/equipment Gait Distance (Feet): 80 Feet Assistive device: Rolling walker (2 wheeled) Gait Pattern/deviations: Step-through pattern Gait velocity: dec   General Gait Details: pt able to clear bilat feet today and stayed in walker, minA to control walker to prevent pushing to far out in front, remains shaky but less than last session   Stairs             Wheelchair Mobility    Modified Rankin (Stroke Patients Only)       Balance Overall balance assessment: Needs assistance Sitting-balance support: No upper extremity supported Sitting balance-Leahy Scale: Fair     Standing balance support: Bilateral upper extremity supported Standing balance-Leahy Scale: Poor Standing balance comment: dependent on RW                            Cognition Arousal/Alertness: Awake/alert Behavior During Therapy: WFL for tasks assessed/performed Overall Cognitive Status: Impaired/Different from baseline Area of Impairment: Problem solving                             Problem Solving: Requires verbal cues;Requires tactile cues;Difficulty sequencing;Slow processing General Comments: significantly better than  last session      Exercises      General Comments General comments (skin integrity, edema, etc.): HR increased to 139 during ambulation      Pertinent Vitals/Pain Pain Assessment: 0-10 Pain Score: 5  Pain Location: back and down L LE Pain Descriptors / Indicators:  Grimacing Pain Intervention(s): Monitored during session    Home Living                      Prior Function            PT Goals (current goals can now be found in the care plan section) Progress towards PT goals: Progressing toward goals    Frequency    Min 5X/week      PT Plan Current plan remains appropriate    Co-evaluation              AM-PAC PT "6 Clicks" Mobility   Outcome Measure  Help needed turning from your back to your side while in a flat bed without using bedrails?: A Little Help needed moving from lying on your back to sitting on the side of a flat bed without using bedrails?: A Little Help needed moving to and from a bed to a chair (including a wheelchair)?: A Lot Help needed standing up from a chair using your arms (e.g., wheelchair or bedside chair)?: A Lot Help needed to walk in hospital room?: A Lot Help needed climbing 3-5 steps with a railing? : Total 6 Click Score: 13    End of Session Equipment Utilized During Treatment: Gait belt;Back brace Activity Tolerance: Patient tolerated treatment well Patient left: in chair;with call bell/phone within reach;with chair alarm set;with nursing/sitter in room Nurse Communication: Mobility status PT Visit Diagnosis: Unsteadiness on feet (R26.81);Difficulty in walking, not elsewhere classified (R26.2);Muscle weakness (generalized) (M62.81)     Time: 0930-1000 PT Time Calculation (min) (ACUTE ONLY): 30 min  Charges:  $Gait Training: 8-22 mins $Therapeutic Activity: 8-22 mins                     Kittie Plater, PT, DPT Acute Rehabilitation Services Pager #: [phone] Office #: [phone]    Berline Lopes 07/16/2018, 11:24 AM

## 2018-07-16 NOTE — Progress Notes (Signed)
PROGRESS NOTE    Kaitlin [facility]argas  XNA:[id number] DOB: 07/07/1950 DOA: 07/10/2018 PCP: [name], MD   Brief Narrative: 27 fem hypertension, hyperlipidemia, B12 deficiency, type 2 diabetes currently on insulin.  Diagnosed in mid January with adenovirus and presented to [facility] 06/16/2018 with increasing back pain and apparently osteomyelitis Complicated course-during IR lumbar puncture procedure had cardiac arrest and CPR X 90 seconds She had an MRI significant for phlegmon L1-L2 discitis with osteomyelitis Infectious disease was consulted and patient was kept on antibiotics placed on cefazolin she was admitted to rehab for further rehab to her prior state  Noted on 2/19 with increasing back pain and MRI showed worsening discitis and large abscess Neurosurgery and infectious disease consulted with regard to antibiotic versus surgical therapy    Assessment & Plan:   Principal Problem:   MSSA bacteremia Active Problems:   Essential hypertension   Type 2 diabetes mellitus with hyperlipidemia ([place])   Diabetic foot ulcer ([place])   Epidural abscess   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 Transesophageal Echocardiogram significant for possible vegetation. -ID recommendations: Continue Cefazolin and add Rifampin-patient has a PICC line that was placed on 06/21/2018  TCTS consulted who have declined operative management at this time We are currently awaiting formal input from PT and nurse is aware to alert case manager for nearing discharge  Spinal cord abscess S/p lumbar laminectomy, posterior fixation, arthrodesis on 2/21. Cultures obtained on 2/21 with rare staphylococcus aureus with sensitivities available -Neurosurgery recommendations had wound VAC in place -Antibiotics as above  Essential hypertension -Continue metoprolol 25 twice daily and lisinopril 10 daily  Sinus  tachycardia -Continue home metoprolol  Acute blood loss anemia In setting of surgery. S/p 2 units of PRBC on 2/22. Hemoglobin improved from 7.6 to 10.0 ranges are currently acceptable of 9-10 range  Diabetes mellitus, type 2 -Continue SSI-Home medications include 20 units of Lantus as well as sliding scale -Blood sugar ranges 84-1 79  Increased nutrition needs -Continue protein supplement  Chronic pain Home meds include MS Contin 30 twice daily which was reduced to 15 twice daily secondary to confusion  Confusion Appears resolved once decreased MS contin.   DVT prophylaxis: SCDs Code Status:   Code Status: Full Code Family Communication: Husband at bedside Disposition Plan: Discharge pending infectious workup   Consultants:   Neurosurgery  Infectious disease  Cardiothoracic surgery  Procedures:  1. Decompressive lumbar laminectomy lumbar 1-2 left with hemi-facetectomy and sublaminar decompression and discectomy.  Disc was sent for biopsy and culture; Posterior fixation T10-L3 inclusive using Alphatec cortical pedicle screws; Intertransverse arthrodesis T10-L3 using morcellized autograft and allograft soaked with a bone marrow aspirate obtained through a separate fascial incision over the left iliac crest. (07/11/2018)  Transesophageal Echocardiogram (07/15/2018) IMPRESSIONS    1. The left ventricle has normal systolic function, with an ejection fraction of 55-60%.  2. No evidence of left ventricular regional wall motion abnormalities.  3. The right ventricle has normal systolc function. The cavity was normal.  4. The mitral valve is normal in structure.  5. The tricuspid valve was normal in structure.  6. The aortic valve is tricuspid no stenosis of the aortic valve. A mobile aortic valve vegetation is present.  7. There is evidence of plaque in the descending aorta.  8. Normal LV function; oscillating density on aortic side of aortic valve concerning for vegetation;  no  AI; suggest 8 weeks of antibiotics and then repeat TEE.  Antimicrobials:  Cefazolin  Rifampin    Subjective:  No new issues but is constipated and requesting something for the same No chest pain no fever no chills  Objective: Vitals:   07/16/18 0700 07/16/18 0800 07/16/18 0900 07/16/18 1000  BP:  (!) 149/86    Pulse: 96 99 (!) 104 (!) 114  Resp: 17 17 (!) 22 15  Temp:      TempSrc:      SpO2: 100% 98% 100% 99%  Weight:      Height:        Intake/Output Summary (Last 24 hours) at 07/16/2018 1012 Last data filed at 07/16/2018 1000 Gross per 24 hour  Intake 1718.34 ml  Output 1925 ml  Net -206.66 ml   Filed Weights   07/11/18 1433  Weight: 72.6 kg    Examination:  Pleasant awake alert no distress EOMI NCAT coherent Power 5/5 bilaterally S1-S2 no murmur rub or gallop Patient has an Aspen brace on Abdomen not examined Able to raise both legs off the ground and straighten legs out Neurologically overall intact   Data Reviewed: I have personally reviewed following labs and imaging studies  CBC: Recent Labs  Lab 07/12/18 0722 07/13/18 0613 07/14/18 0755  WBC 6.1 6.7 6.3  HGB 7.6* 10.0* 9.9*  HCT 25.6* 30.9* 30.9*  MCV 104.5* 95.7 97.2  PLT 301 288 127   Basic Metabolic Panel: Recent Labs  Lab 07/12/18 1143 07/13/18 0950 07/14/18 0755  NA 130* 129* 131*  K 4.5 4.4 3.9  CL 94* 98 100  CO2 27 24 25   GLUCOSE 439* 323* 199*  BUN 17 14 9   CREATININE 0.65 0.56 0.45  CALCIUM 8.3* 8.0* 8.1*   GFR: Estimated Creatinine Clearance: 56.2 mL/min (by C-G formula based on SCr of 0.45 mg/dL). Liver Function Tests: No results for input(s): AST, ALT, ALKPHOS, BILITOT, PROT, ALBUMIN in the last 168 hours. No results for input(s): LIPASE, AMYLASE in the last 168 hours. Recent Labs  Lab 07/14/18 0755  AMMONIA 21   Coagulation Profile: No results for input(s): INR, PROTIME in the last 168 hours. Cardiac Enzymes: No results for input(s): CKTOTAL, CKMB,  CKMBINDEX, TROPONINI in the last 168 hours. BNP (last 3 results) No results for input(s): PROBNP in the last 8760 hours. HbA1C: No results for input(s): HGBA1C in the last 72 hours. CBG: Recent Labs  Lab 07/15/18 0849 07/15/18 1121 07/15/18 1614 07/15/18 2141 07/16/18 0829  GLUCAP 208* 194* 273* 84 179*   Lipid Profile: No results for input(s): CHOL, HDL, LDLCALC, TRIG, CHOLHDL, LDLDIRECT in the last 72 hours. Thyroid Function Tests: No results for input(s): TSH, T4TOTAL, FREET4, T3FREE, THYROIDAB in the last 72 hours. Anemia Panel: No results for input(s): VITAMINB12, FOLATE, FERRITIN, TIBC, IRON, RETICCTPCT in the last 72 hours. Sepsis Labs: No results for input(s): PROCALCITON, LATICACIDVEN in the last 168 hours.  Recent Results (from the past 240 hour(s))  Aerobic/Anaerobic Culture (surgical/deep wound)     Status: None (Preliminary result)   Collection Time: 07/11/18  4:57 PM  Result Value Ref Range Status   Specimen Description TISSUE  Final   Special Requests L1 2 DISC SPACE  Final   Gram Stain   Final    NO WBC SEEN NO ORGANISMS SEEN Performed at [facility], [address], [place], [place] [address]    Culture   Final    RARE STAPHYLOCOCCUS AUREUS CRITICAL RESULT CALLED TO, READ BACK  BY AND VERIFIED WITH: C. MITCHELL,  RN CONCERNING CULTURE GROWTH AT 1443 ON 07/12/18 BY C. JESSUP, MLT. NO ANAEROBES ISOLATED; CULTURE IN PROGRESS FOR 5 DAYS    Report Status PENDING  Incomplete   Organism ID, Bacteria STAPHYLOCOCCUS AUREUS  Final      Susceptibility   Staphylococcus aureus - MIC*    CIPROFLOXACIN <=0.5 SENSITIVE Sensitive     ERYTHROMYCIN >=8 RESISTANT Resistant     GENTAMICIN <=0.5 SENSITIVE Sensitive     OXACILLIN 0.5 SENSITIVE Sensitive     TETRACYCLINE <=1 SENSITIVE Sensitive     VANCOMYCIN 1 SENSITIVE Sensitive     TRIMETH/SULFA <=10 SENSITIVE Sensitive     CLINDAMYCIN <=0.25 SENSITIVE Sensitive     RIFAMPIN <=0.5 SENSITIVE Sensitive      Inducible Clindamycin NEGATIVE Sensitive     * RARE STAPHYLOCOCCUS AUREUS  Aerobic/Anaerobic Culture (surgical/deep wound)     Status: None (Preliminary result)   Collection Time: 07/11/18  4:58 PM  Result Value Ref Range Status   Specimen Description WOUND  Final   Special Requests L1 2 DISC SPACE  Final   Gram Stain   Final    RARE WBC PRESENT, PREDOMINANTLY PMN NO ORGANISMS SEEN    Culture   Final    NO GROWTH 4 DAYS NO ANAEROBES ISOLATED; CULTURE IN PROGRESS FOR 5 DAYS Performed at [facility], [address], [place], [place] [address]    Report Status PENDING  Incomplete         Radiology Studies: No results found.      Scheduled Meds: . aspirin  81 mg Oral q morning - 10a  . bethanechol  10 mg Oral TID  . feeding supplement (GLUCERNA SHAKE)  237 mL Oral TID WC  . feeding supplement (PRO-STAT SUGAR FREE 64)  30 mL Oral BID  . ferrous sulfate  325 mg Oral Q breakfast  . furosemide  40 mg Oral Daily  . insulin aspart  0-15 Units Subcutaneous TID WC  . insulin glargine  20 Units Subcutaneous Daily  . ketoconazole   Topical BID  . lisinopril  10 mg Oral Daily  . metoprolol tartrate  25 mg Oral BID  . morphine  15 mg Oral Q12H  . multivitamin with minerals  1 tablet Oral Daily  . ondansetron  4 mg Oral BID  . pantoprazole  40 mg Oral Daily  . polyethylene glycol  17 g Oral Daily  . rifampin  300 mg Oral Q12H  . senna  1 tablet Oral BID  . sodium chloride flush  10-40 mL Intracatheter Q12H  . sodium chloride flush  3 mL Intravenous Q12H  . vitamin B-12  1,000 mcg Oral Daily   Continuous Infusions: . sodium chloride    . 0.9 % NaCl with KCl 20 mEq / L 50 mL/hr at 07/16/18 0900  .  ceFAZolin (ANCEF) IV Stopped (07/16/18 0646)  . methocarbamol (ROBAXIN) IV       LOS: 6 days   Verneita Griffes, MD [facility] 10:24 AM   If 7PM-7AM, please contact night-coverage [email or web address]

## 2018-07-16 NOTE — Progress Notes (Addendum)
Patient transfer to rehab via wheelchair with belongings and prescription.  Pt is alert and oriented.  RN updated regarding PICC line access.

## 2018-07-16 NOTE — Progress Notes (Signed)
Kaitlin Arn, MD  Physician  Physical Medicine and Rehabilitation  PMR Pre-admission  Signed  Date of Service:  07/16/2018 1:06 PM       Related encounter: Admission (Discharged) from 07/10/2018 in [facility]         Show:Clear all '[x]' Manual'[x]' Template'[x]' Copied  Added by: '[x]' Cristina Gong, RN'[x]' Kaitlin Arn, MD  '[]' Hover for details  PMR Admission Coordinator Pre-Admission Assessment  Patient: Kaitlin Vargas is an 68 y.o., female MRN: [id number] DOB: [birth year] Height: '4\' 10"'  (147.3 cm) Weight: 72.6 kg  Insurance Information HMO: yes    PPO:      PCP:      IPA:      80/20:      OTHER: medicare advantage plan PRIMARY: [employer] Medicare      Policy#: [id number]      Subscriber: pt CM Name: Dot   Phone#: [phone] ext [phone]     Fax#: [phone] Pre-Cert#: [id number] for & days with updates due in 7 days to [facility] ext [phone]      Employer: retired Benefits:  Phone #: [phone]     Name: 07/14/2018 Eff. Date: 05/21/2018     Deduct: none      Out of Pocket Max: $3400      Life Max: none CIR: $295 co pay per day days 1 until 6      SNF: no co pay days 1 until 20: $178 co pay per day days 21 until 100 Outpatient: $40 co pay per visit     Co-Pay: visits per medical neccesity Home Health: 100%      Co-Pay: visits per medical neccesity DME: 80%     Co-Pay: 20% Providers: in network  SECONDARY: none       Medicaid Application Date:       Case Manager:  Disability Application Date:       Case Worker:   Emergency Publishing copy Information    Name Relation Home Work [place], [place] Spouse   [phone]      Current Medical History  Patient Admitting Diagnosis: Lumbar osteomyelitis; MSSA   History of Present Illness: Kaitlin Vargas a 68 year old right-handed female with history of hypertension, hyperlipidemia, B12 deficiency and diabetes mellitus. Patient with reported back pain 4  weeks. She continued to have back pain radiating to the lower extremities as well as bouts of urinary incontinence and stool incontinence. She presented to [facility]2018 with increasing back pain with lower extremity weakness and a CT scan of the spine showed what appeared to be osteomyelitis. She was transferred to [facility] for interventional radiology completed lumbar puncture and during the procedure noted increase active bleeding as well as cardiac arrest requiring CPR 90 seconds. She did not require intubation. It was felt cardiac arrest likely due to respiratory arrest following sedation. 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 Patient also with right foot ulceration. MRI of the foot negative for acute bony or joint abnormality. Small volume of fluid in the subcutaneous tissue deep to the first TMT joint. Infectious disease consulted patient with GPCs growing from lumbar spine aspirate. Placed on antibiotic therapy. TEE negative for vegetation. She had been placed on Cefazolin: Per infectious disease through 08/13/2018.subcutaneous heparin for DVT prophylaxis. Hospital course of pain management maintained on long-acting MS Contin.Acute on  chronic anemia 7.6 transfused 2 units of packed red blood cells 07/12/2018 and monitored closely initial bouts of hyponatremia 130. Therapy evaluations completed and patient was admitted for a comprehensive rehabilitation program to 06/26/2018.   Patient was progressing nicely at [facility] and developed increasing back pain 07/09/2018. 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 Also showed prevertebral abscess. Infectious disease as well as neurosurgery consulted and plan was to discharge to acute care services for ongoing care 07/10/2018. After long discussion between neurosurgery and  infectious disease it was decided to explore abscess as patient appeared not to be responding to antibiotics and underwent decompressive lumbar laminectomy lumbar 1-2 left with hemi-facetectomy and sublaminar decompression and discectomy. Disc was sent for biopsy and culture. Posterior fixation T10-L3 using cortical pedicle screws. Intertransverse arthrodesis T10-L3 with bone marrow aspirate obtained through a separate fascial incision over the left iliac crest 07/11/2018 per Dr. Sherley Bounds. Back brace when out of bed applied in sitting position.Pain management with the use of MS Contin monitoring mental status for confusion. Infectious disease follow-up current plan is for 8 weeks of IV cefazolin plus oral rifampin 300 mg twice a day from date of surgery 07/11/2018. 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    Patient's medical record from [facility] has been reviewed by the rehabilitation admission coordinator and physician.   Past Medical History      Past Medical History:  Diagnosis Date  . Anemia   . Diabetes mellitus without complication ([place])   . Hyperlipidemia   . Hypertension   . Peripheral neuropathy     Family History   family history includes Cancer in her father and mother; Leukemia in her father; Stroke in her mother.  Prior Rehab/Hospitalizations Has the patient had major surgery during 100 days prior to admission? YesCIR 06/26/2018 until 07/10/2018 when readmitted to acute for surgery              Current Medications  Current Facility-Administered Medications:  .  0.9 %  sodium chloride infusion, 250 mL,  Intravenous, Continuous, Crenshaw, Aaron Edelman S, MD .  0.9 % NaCl with KCl 20 mEq/ L  infusion, , Intravenous, Continuous, Crenshaw, Denice Bors, MD, Last Rate: 50 mL/hr at 07/16/18 0900 .  acetaminophen (TYLENOL) tablet 650 mg, 650 mg, Oral, Q4H PRN, 650 mg at 07/12/18 1428 **OR** acetaminophen (TYLENOL) suppository 650 mg, 650 mg, Rectal, Q4H PRN, Lelon Perla, MD .  albuterol (PROVENTIL) (2.5 MG/3ML) 0.083% nebulizer solution 2.5 mg, 2.5 mg, Nebulization, Q2H PRN, Lelon Perla, MD .  aspirin chewable tablet 81 mg, 81 mg, Oral, q morning - 10a, Lelon Perla, MD, 81 mg at 07/16/18 0906 .  bethanechol (URECHOLINE) tablet 10 mg, 10 mg, Oral, TID, Lelon Perla, MD, 10 mg at 07/16/18 0906 .  bisacodyl (DULCOLAX) suppository 10 mg, 10 mg, Rectal, Daily PRN, Lelon Perla, MD .  ceFAZolin (ANCEF) IVPB 2g/100 mL premix, 2 g, Intravenous, Q8H, Lelon Perla, MD, Stopped at 07/16/18 [phone] .  feeding supplement (GLUCERNA SHAKE) (GLUCERNA SHAKE) liquid 237 mL, 237 mL, Oral, TID WC, Lelon Perla, MD, 237 mL at 07/16/18 1141 .  feeding supplement (PRO-STAT SUGAR FREE  64) liquid 30 mL, 30 mL, Oral, BID, Lelon Perla, MD, 30 mL at 07/16/18 0907 .  ferrous sulfate tablet 325 mg, 325 mg, Oral, Q breakfast, Crenshaw, Denice Bors, MD, 325 mg at 07/16/18 6503 .  furosemide (LASIX) tablet 40 mg, 40 mg, Oral, Daily, Lelon Perla, MD, 40 mg at 07/16/18 0907 .  HYDROcodone-acetaminophen (NORCO) 7.5-325 MG per tablet 1 tablet, 1 tablet, Oral, Q6H PRN, Lelon Perla, MD, 1 tablet at 07/15/18 2135 .  insulin aspart (novoLOG) injection 0-15 Units, 0-15 Units, Subcutaneous, TID WC, Lelon Perla, MD, 2 Units at 07/16/18 1150 .  insulin glargine (LANTUS) injection 20 Units, 20 Units, Subcutaneous, Daily, Lelon Perla, MD, 20 Units at 07/16/18 0908 .  ketoconazole (NIZORAL) 2 % cream, , Topical, BID, Crenshaw, Denice Bors, MD .  lisinopril (PRINIVIL,ZESTRIL) tablet 10 mg, 10 mg, Oral, Daily,  Lelon Perla, MD, 10 mg at 07/16/18 0907 .  menthol-cetylpyridinium (CEPACOL) lozenge 3 mg, 1 lozenge, Oral, PRN **OR** phenol (CHLORASEPTIC) mouth spray 1 spray, 1 spray, Mouth/Throat, PRN, Stanford Breed, Denice Bors, MD .  methocarbamol (ROBAXIN) tablet 500 mg, 500 mg, Oral, Q6H PRN, 500 mg at 07/15/18 2136 **OR** methocarbamol (ROBAXIN) 500 mg in dextrose 5 % 50 mL IVPB, 500 mg, Intravenous, Q6H PRN, Lelon Perla, MD .  metoprolol tartrate (LOPRESSOR) tablet 25 mg, 25 mg, Oral, BID, Lelon Perla, MD, 25 mg at 07/16/18 0907 .  morphine (MS CONTIN) 12 hr tablet 15 mg, 15 mg, Oral, Q12H, Lelon Perla, MD, 15 mg at 07/16/18 0906 .  multivitamin with minerals tablet 1 tablet, 1 tablet, Oral, Daily, Lelon Perla, MD, 1 tablet at 07/16/18 0907 .  ondansetron (ZOFRAN) tablet 4 mg, 4 mg, Oral, Q6H PRN **OR** ondansetron (ZOFRAN) injection 4 mg, 4 mg, Intravenous, Q6H PRN, Lelon Perla, MD .  ondansetron (ZOFRAN-ODT) disintegrating tablet 4 mg, 4 mg, Oral, BID, Lelon Perla, MD, 4 mg at 07/16/18 [phone] .  pantoprazole (PROTONIX) EC tablet 40 mg, 40 mg, Oral, Daily, Lelon Perla, MD, 40 mg at 07/16/18 0906 .  polyethylene glycol (MIRALAX / GLYCOLAX) packet 17 g, 17 g, Oral, BID, Samtani, Jai-Gurmukh, MD .  rifampin (RIFADIN) capsule 300 mg, 300 mg, Oral, TID, Carlyle Basques, MD .  senna (SENOKOT) tablet 8.6 mg, 1 tablet, Oral, BID, Lelon Perla, MD, 8.6 mg at 07/16/18 0906 .  sodium chloride flush (NS) 0.9 % injection 10-40 mL, 10-40 mL, Intracatheter, Q12H, Lelon Perla, MD, 10 mL at 07/16/18 0904 .  sodium chloride flush (NS) 0.9 % injection 10-40 mL, 10-40 mL, Intracatheter, PRN, Lelon Perla, MD .  sodium chloride flush (NS) 0.9 % injection 3 mL, 3 mL, Intravenous, Q12H, Lelon Perla, MD, 3 mL at 07/16/18 0904 .  sodium chloride flush (NS) 0.9 % injection 3 mL, 3 mL, Intravenous, PRN, Stanford Breed, Denice Bors, MD .  sodium phosphate (FLEET) 7-19 GM/118ML enema 1  enema, 1 enema, Rectal, Daily PRN, Lelon Perla, MD, 1 enema at 07/16/18 1132 .  sorbitol 70 % solution 30 mL, 30 mL, Oral, Daily PRN, Lelon Perla, MD .  vitamin B-12 (CYANOCOBALAMIN) tablet 1,000 mcg, 1,000 mcg, Oral, Daily, Lelon Perla, MD, 1,000 mcg at 07/16/18 0907  Patients Current Diet:      Diet Order             Diet - low sodium heart healthy         Diet heart healthy/carb modified Room  service appropriate? Yes with Assist; Fluid consistency: Thin  Diet effective now               Precautions / Restrictions Precautions Precautions: Fall, Back Precaution Booklet Issued: Yes (comment) Precautions/Special Needs: Orthotics/Bracing Precaution Comments: reviewed Spinal Brace: Applied in sitting position Other Brace: JP drain in back Restrictions Weight Bearing Restrictions: No Other Position/Activity Restrictions: brace not available at time of eval(RN reports she plans to follow up with orthotech )   Has the patient had 2 or more falls or a fall with injury in the past year?No  Prior Activity Level Community (5-7x/wk): was not driving dut to vision issues. Needed RW and cane due to peripheral neuropathy  Prior Functional Level Do you want Prior Function Level of Independence: Independent with assistive device(s) from other? Self Care: Did the patient need help bathing, dressing, using the toilet or eating?  Independent  Indoor Mobility: Did the patient need assistance with walking from room to room (with or without device)? Independent  Stairs: Did the patient need assistance with internal or external stairs (with or without device)? Independent  Functional Cognition: Did the patient need help planning regular tasks such as shopping or remembering to take medications? [place] / [place] Devices/Equipment: Environmental consultant (specify type), Cane (specify quad or straight) Home Equipment: Walker - 2  wheels, Cane - single point  Prior Device Use: Indicate devices/aids used by the patient prior to current illness, exacerbation or injury? Walker  Prior Functional Level   Prior Functional Level Current Functional Level  Bed Mobility  Independent  min assist  Transfers Independent  Mod assist to power up and block feet from sliding  Mobility - Walk/Wheelchair Mod I with RW and cane   Mobility - Ambulation/Gait  Mod assist, +2 safety/equipment 65 feet with RW. Very shaky and required mod assist for walker management, and max directional VCs  Upper Body Dressing  Independent Minimal assistance, Sitting  Lower Body Dressing Independent Total assistance, +2 for physical assistance, Sit to/from stand  Grooming Independent Oral care, Wash/dry face, Brushing hair, Set up, Supervision/safety, Sitting  Eating/Drinking Independent   set up  Toilet Transfer Independent +2 for physical assistance, +2 for safety/equipment, Stand-pivot, RW, Maximal assistance  Bladder Continence continent   Bowel Management  continent   Stair Climbing Mod I   not attempted  Communication independent No difficulties  Memory intact Decreased recall of precautions  Cooking/Meal Prep  independent     Housework  Daughter assist   Money Management  independent   Driving  Did not drive due to vision issues     Special needs/care consideration BiPAP/CPAP n/a CPM n/a Continuous Drip IV PICC line Dialysis n/a Life Vest n/a Oxygen n/a Special Bed n/a Trach Size n/a Wound Vac  N/a Skin surgical incision with dressing; arms and feet with ecchymosis; moisture associated skin damage to perineum Bowel mgmt: incontinent LBM; patient complains of constipation on day of admit requesting fleets enema Bladder mgmt: external catheter Diabetic mgmt Hgb A1c 8.5 on 06/07/2018 Cefazolin via PICC with end date 09/05/2018      Tenna Child, RN  Registered Nurse  WOC  Consult Note   Addendum   Date of  Service:  07/03/2018 2:11 PM         Related encounter: Admission (Discharged) from 06/26/2018 in [place] all '[x]' ?Manual'[x]' ?Template'[x]' ?Copied  Added by: '[x]' ?Tenna Child, RN  '[]' ?  Hover for details [place] Nurse wound follow up Previous WOC consult was performed on 2/7;right footwound has greatly improved since that time. 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 Patient states she has seen a podiatrist in the distant past for this problem. Wound type:Right foot with healing wound; no further depth or callous, dry peeling yellow skin lifted and removed easily, revealing partial thickness wound .2X.2X.1cm, pink and dry, surrounded by dark reddish-purple intact skin. No odor or drainage.  Dressing procedure/placement/frequency:Discontinue use of xeroform; foam dressing applied to protect from further injury. Discussed plan of care with patient. Please re-consult if further assistance is needed. Thank-you,  Julien Girt MSN, RN, [place], [place], CNS [phone]     Revision History                          Previous Home Environment Living Arrangements: Spouse/significant other  Lives With: Spouse, Daughter Available Help at Discharge: Family, Available 24 hours/day Type of Home: House Home Layout: One level Home Access: Ramped entrance Bathroom Shower/Tub: Chiropodist: Standard Bathroom Accessibility: Yes How Accessible: Accessible via walker [place]: No Additional Comments: Ambulated with cane and RW PTA  Discharge Living Setting Plans for Discharge Living Setting: Patient's home, Lives with (comment) Type of Home at Discharge: House Discharge Home Layout: One level Discharge Home Access: [place] entrance Discharge Bathroom Shower/Tub: Tub/shower unit, Curtain Discharge Bathroom Toilet:  Standard Discharge Bathroom Accessibility: Yes How Accessible: Accessible via walker Does the patient have any problems obtaining your medications?: No  Social/Family/Support Systems Patient Roles: Spouse, Parent Contact Information: spouse,  John Anticipated Caregiver: Husband and Chemical engineer Information: see above Ability/Limitations of Caregiver: Husband is retired and Programmer, systems works third shift Caregiver Availability: 24/7 Discharge Plan Discussed with Primary Caregiver: Yes Is Caregiver In Agreement with Plan?: Yes Does Caregiver/Family have Issues with Lodging/Transportation while Pt is in Rehab?: No  Goals/Additional Needs Patient/Family Goal for Rehab: supervision to min asisst with PT and OT Expected length of stay: ELOS 10 to 14 days Equipment Needs: IV antibiotics at d/c via PICC Pt/Family Agrees to Admission and willing to participate: Yes Program Orientation Provided & Reviewed with Pt/Caregiver Including Roles  & Responsibilities: Yes  Patient Condition: I have reviewed medical records from [facility] , spoken with patient and spouse.I met with patient at the bedside for inpatient rehabilitation assessment.  Patient will benefit from ongoing PT and OT, can actively participate in 3 hours of therapy a day 5 days of the week, and can make measurable gains during the admission.  Patient will also benefit from the coordinated team approach during an Inpatient Acute Rehabilitation admission.  The patient will receive intensive therapy as well as Rehabilitation physician, nursing, social worker, and care management interventions.  Due to bowel management, bladder management, safety, skin/wound care, disease management, medical administration, pain management, patient education the patient requires 24 hour a day rehabilitation nursing.  The patient is currently min to mod assist with mobility and basic ADLs.  Discharge setting and therapy post  discharge at  home with home health is anticipated.  Patient has agreed to participate in the Acute Inpatient Rehabilitation Program and will admit today.  Preadmission Screen Completed By:  Cleatrice Burke RN MSN, 07/16/2018 12:44 PM ______________________________________________________________________   Discussed status with Dr. Posey Pronto  on  07/16/2018 at  1243 and received telephone approval for admission today.  Admission Coordinator:  Danne Baxter  Jefm Bryant RN MSN, time 1243 Date  07/16/2018   Assessment/Plan: Diagnosis: Lumbar osteomyelitis; MSSA   1. Does the need for close, 24 hr/day  Medical supervision in concert with the patient's rehab needs make it unreasonable for this patient to be served in a less intensive setting? Yes  2. Co-Morbidities requiring supervision/potential complications: hypertension, hyperlipidemia, B12 deficiency and diabetes mellitus 3. Due to safety, disease management, pain management and patient education, does the patient require 24 hr/day rehab nursing? Yes 4. Does the patient require coordinated care of a physician, rehab nurse, PT (1-2 hrs/day, 5 days/week) and OT (1-2 hrs/day, 5 days/week) to address physical and functional deficits in the context of the above medical diagnosis(es)? Yes Addressing deficits in the following areas: balance, endurance, locomotion, strength, transferring, bathing, dressing, toileting and psychosocial support 5. Can the patient actively participate in an intensive therapy program of at least 3 hrs of therapy 5 days a week? Yes 6. The potential for patient to make measurable gains while on inpatient rehab is excellent 7. Anticipated functional outcomes upon discharge from inpatients are: supervision and min assist PT, supervision and min assist OT, n/a SLP 8. Estimated rehab length of stay to reach the above functional goals is: 14-17 days. 9. Anticipated D/C setting: Home 10. Anticipated post D/C treatments: HH  therapy and Home excercise program 11. Overall Rehab/Functional Prognosis: good  Delice Lesch, MD, ABPMR Julious Payer Audelia Acton RN MSN Admissions Coordinator 07/16/2018        Revision History

## 2018-07-16 NOTE — Progress Notes (Signed)
Spoke with RN regarding PICC line assessment. Currently no blood return present and difficult to flush. TPA dose given  Earlier today as documented. Recommended 2nd TPA dose if MD agrees. RN states will contact MD and consult VAS Team with any new orders.

## 2018-07-16 NOTE — Discharge Summary (Signed)
Physician Discharge Summary  White XIH:[id number] DOB: 11/12/1950 DOA: 07/10/2018  PCP: [name], MD  Admit date: 07/10/2018 Discharge date: 07/16/2018  Time spent: 45 minutes  Recommendations for Outpatient Follow-up:  1. cmet and cbc 1 week 2. 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  Diagnosed in mid January with adenovirus and presented to [facility] 06/16/2018 with increasing back pain and apparently osteomyelitis Complicated course-during IR lumbar puncture procedure had cardiac arrest and CPR X 90 seconds She had an MRI significant for phlegmon L1-L2 discitis with osteomyelitis Infectious disease was consulted and patient was kept on antibiotics placed on cefazolin she was admitted to rehab for further rehab to her prior state  Noted on 2/19 with increasing back pain and MRI showed worsening discitis and large abscess Neurosurgery and infectious disease consulted with regard to antibiotic versus surgical therapy  Hospital Course:   MSSA bacteremia Aortic valve endocarditis Patient has been on treatment with Cefazolin. Transesophageal Echocardiogram significant for possible vegetation. -ID recommendations: Continue Cefazolin and add Rifampin-patient has a PICC line that was placed on 06/21/2018  TCTS consulted who have declined operative management at this time and TOC TEE as OP by Cardiology  Spinal cord abscess S/p lumbar laminectomy, posterior fixation, arthrodesis  on 2/21. Cultures obtained on 2/21 with rare staphylococcus aureus with sensitivities available -Neurosurgery recommendations had wound VAC in place now off -Neurosurgery has determined needs brace when awake, no further recs -Antibiotics as above for 8 weeks through 09/02/2018 with IV antibiotics as well as Rifampin 300 tid  Essential hypertension -Continue metoprolol 25 twice daily and lisinopril 10 daily  Sinus tachycardia -Continue home metoprolol  Acute blood loss anemia In setting of surgery. S/p 2 units of PRBC on 2/22. Hemoglobin improved from 7.6 to 10.0 ranges are currently acceptable of 9-10 range  Diabetes mellitus, type 2 -Continue SSI-Home medications include 20 units of Lantus as well as sliding scale -Blood sugar ranges 84-1 79 -OP adjustment as prn  Increased nutrition needs -Continue protein supplement  Chronic pain Home meds include MS Contin 30 twice daily which was reduced to 15 twice daily secondary to confusion  Confusion Appears resolved once decreased MS contin.  Procedures: PROCEDURE:   1. Decompressive lumbar laminectomy lumbar 1-2 left with hemi-facetectomy and sublaminar decompression and discectomy.  Disc was sent for biopsy and culture 2. Posterior fixation T10-L3 inclusive using Alphatec cortical pedicle screws.   3. Intertransverse arthrodesis T10-L3 using morcellized autograft and allograft soaked with a bone marrow aspirate obtained through a separate fascial incision over the left iliac crest. (i.e. Studies not automatically included, echos, thoracentesis, etc; not x-rays)  Consultations:  ID  Neurosurg  Cardiothoracic  Cardiology  Discharge Exam: Vitals:   07/16/18 1000 07/16/18 1200  BP:    Pulse: (!) 114   Resp: 15   Temp:  98.7 F (37.1 C)  SpO2: 99%     General: awake alert pleasant in nad Cardiovascular:  s1 s 2no m/r/g Respiratory: clear no  added sound no rales no rhonchi s1 s2 no m/r/g abd soft nt nd no  rebound no guard Neuro intact no focal weak--able to lift both LE up  Discharge Instructions   Discharge Instructions    Diet - low sodium heart healthy   Complete by:  As directed    Home infusion instructions [facility] May follow [place] Dosing Protocol; May administer Cathflo as needed to maintain patency of vascular access device.; Flushing of vascular access device: per [facility] Protocol: 0.9% NaCl pre/post medica...   Complete by:  As directed    Instructions:  May follow [place] Dosing Protocol   Instructions:  May administer Cathflo as needed to maintain patency of vascular access device.   Instructions:  Flushing of vascular access device: per [facility] Protocol: 0.9% NaCl pre/post medication administration and prn patency; Heparin 100 u/ml, 35m for implanted ports and Heparin 10u/ml, 595mfor all other central venous catheters.   Instructions:  May follow [facility] Anaphylaxis Protocol for First Dose Administration in the home: 0.9% NaCl at 25-50 ml/hr to maintain IV access for protocol meds. Epinephrine 0.3 ml IV/IM PRN and Benadryl 25-50 IV/IM PRN s/s of anaphylaxis.   Instructions:  [facility] Coordinator (RN) to assist per patient IV care needs in the home PRN.   Increase activity slowly   Complete by:  As directed      Allergies as of 07/16/2018      Reactions   Sulfa Antibiotics Rash      Medication List    TAKE these medications   albuterol (2.5 MG/3ML) 0.083% nebulizer solution Commonly known as:  PROVENTIL Take 3 mLs (2.5 mg total) by nebulization every 2 (two) hours as needed for wheezing or shortness of breath.   aspirin 81 MG chewable tablet Chew 81 mg by mouth every morning.   bethanechol 10 MG tablet Commonly known as:  URECHOLINE Take 1 tablet (10 mg total) by mouth 3 (three) times daily.   bisacodyl 10 MG suppository Commonly known as:  DULCOLAX Place 1 suppository (10 mg total) rectally daily as needed for moderate constipation.    ceFAZolin  IVPB Commonly known as:  ANCEF Inject 2 g into the vein every 8 (eight) hours. Indication: MSSA L-spine discitis Last Day of Therapy:  09/05/2018 Labs - Once weekly:  CBC/D and BMP, Labs - Every other week:  ESR and CRP What changed:  additional instructions   ferrous sulfate 325 (65 FE) MG tablet Take 1 tablet (325 mg total) by mouth daily with breakfast. Start taking on:  July 17, 2018   furosemide 20 MG tablet Commonly known as:  LASIX Take 2 tablets (40 mg total) by mouth daily.   heparin 5000 UNIT/ML injection Inject 1 mL (5,000 Units total) into the skin every 8 (eight) hours.   HYDROcodone-acetaminophen 10-325 MG tablet Commonly known as:  NORCO Take 1 tablet by mouth every 4 (four) hours as needed for moderate pain.   insulin aspart 100 UNIT/ML injection Commonly known as:  novoLOG Inject 0-9 Units into the skin 3 (three) times daily with meals.   insulin glargine 100 UNIT/ML injection Commonly known as:  LANTUS Inject 0.2 mLs (20 Units total) into the skin daily.   ketoconazole 2 % cream Commonly known as:  NIZORAL Apply topically 2 (two) times daily.   methocarbamol 500 MG tablet Commonly known as:  ROBAXIN Take 1 tablet (500 mg total) by mouth every 6 (six) hours as needed for muscle spasms. What changed:  medication strength  how much to take  when to take this  reasons to take this   metoprolol tartrate 25 MG tablet Commonly known as:  LOPRESSOR Take 1 tablet (25 mg total) by mouth 2 (two) times daily.   morphine 15 MG 12 hr tablet Commonly known as:  MS CONTIN Take 1 tablet (15 mg total) by mouth every 12 (twelve) hours. What changed:    medication strength  how much to take   pantoprazole 40 MG tablet Commonly known as:  PROTONIX Take 1 tablet (40 mg total) by mouth daily.   rifampin 300 MG capsule Commonly known as:  RIFADIN Take 1 capsule (300 mg total) by mouth 3 (three) times daily.   senna-docusate 8.6-50 MG  tablet Commonly known as:  Senokot-S Take 2 tablets by mouth at bedtime.   sodium phosphate 7-19 GM/118ML Enem Place 133 mLs (1 enema total) rectally daily as needed for severe constipation.   THERA Tabs Take 1 tablet by mouth daily.   vitamin B-12 1000 MCG tablet Commonly known as:  CYANOCOBALAMIN Take 1 tablet (1,000 mcg total) by mouth daily for 30 days.            Home Infusion Instuctions  (From admission, onward)         Start     Ordered   07/16/18 0000  Home infusion instructions [facility] May follow [name] Dosing Protocol; May administer Cathflo as needed to maintain patency of vascular access device.; Flushing of vascular access device: per [facility] Protocol: 0.9% NaCl pre/post medica...    Question Answer Comment  Instructions May follow [place] Dosing Protocol   Instructions May administer Cathflo as needed to maintain patency of vascular access device.   Instructions Flushing of vascular access device: per [facility] Protocol: 0.9% NaCl pre/post medication administration and prn patency; Heparin 100 u/ml, 20m for implanted ports and Heparin 10u/ml, 538mfor all other central venous catheters.   Instructions May follow [facility] Anaphylaxis Protocol for First Dose Administration in the home: 0.9% NaCl at 25-50 ml/hr to maintain IV access for protocol meds. Epinephrine 0.3 ml IV/IM PRN and Benadryl 25-50 IV/IM PRN s/s of anaphylaxis.   Instructions [facility] Infusion Coordinator (RN) to assist per patient IV care needs in the home PRN.      07/16/18 1235           Durable Medical Equipment  (From admission, onward)         Start     Ordered   07/11/18 2017  DME Walker rolling  Once    Question:  Patient needs a walker to treat with the following condition  Answer:  S/P lumbar fusion   07/11/18 2016   07/11/18 2017  DME 3 n 1  Once     07/11/18 2016         Allergies  Allergen Reactions  . Sulfa Antibiotics Rash      The results of  significant diagnostics from this hospitalization (including imaging, microbiology, ancillary and laboratory) are listed below for reference.    Significant Diagnostic Studies: Dg Thoracolumabar Spine  Result Date: 07/11/2018 CLINICAL DATA:  Thoracolumbar fusion.  Spinal infection. EXAM: THORACOLUMBAR SPINE 1V COMPARISON:  Lumbar MRI 07/09/2018 FINDINGS: C-arm images were obtained of the thoracic and lumbar spine. Bilateral pedicle screw and posterior rod fusion extending from T10 through L3. Endplate erosion at L1D5-3onsistent with infection IMPRESSION: Pedicle screw and posterior rod fusion T10 through L3. Electronically Signed   By:  Franchot Gallo M.D.   On: 07/11/2018 19:30   Ct Lumbar Spine Wo Contrast  Result Date: 07/09/2018 CLINICAL DATA:  Low back pain. Assess stenosis. History of discitis osteomyelitis. EXAM: CT LUMBAR SPINE WITHOUT CONTRAST TECHNIQUE: Multidetector CT imaging of the lumbar spine was performed without intravenous contrast administration. Multiplanar CT image reconstructions were also generated. COMPARISON:  MRI of the lumbar spine July 09, 2018 and CT abdomen and pelvis June 16, 2018. FINDINGS: SEGMENTATION: For the purposes of this report the last well-formed intervertebral disc space is reported as L5-S1. ALIGNMENT: Maintained lumbar lordosis. Minimal grade 1 L4-5 anterolisthesis. VERTEBRAE: Severe L1-2 disc height loss with subsidence, destructive bony changes L1 inferior and L2 superior endplates with mild L2 height loss, progressed from prior examination. Remaining lumbar vertebral bodies intact. Remaining disc heights preserved. PARASPINAL AND OTHER SOFT TISSUES: Abnormal soft tissue contiguous with RIGHT crus of the diaphragm with punctate calcifications. Abnormal prevertebral soft tissue swelling with punctate calcifications. Through L2-3 with bilateral iliopsoas fat stranding. Mild calcific atherosclerosis aortoiliac vessels. DISC LEVELS: Soft tissue and punctate  bony fragments at L1-2 result in severe canal stenosis with narrowed neural foramen which may affect the traversing L2 nerves. Neural foraminal stenosis better demonstrated on today's MRI. Progressive widening LEFT L4-5 facet at 3 mm. IMPRESSION: 1. 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 Severe L1-2 canal stenosis. 2. Prevertebral phlegmon/abscess better demonstrated on today's MRI; given presence of punctate calcifications, recommend exclusion of tuberculosis. 3. Increased widening of LEFT L4-5 facet may be infectious or inflammatory. Grade 1 L4-5 anterolisthesis without spondylolysis. 4. These results will be called to the ordering clinician or representative by the professional radiologist assistant, and communication documented in [employer] Dashboard. Electronically Signed   By: Elon Alas M.D.   On: 07/09/2018 19:23   Mr Jeri Cos EX Contrast  Result Date: 07/10/2018 CLINICAL DATA:  Encephalopathy.  Rule out infection EXAM: MRI HEAD WITHOUT AND WITH CONTRAST TECHNIQUE: Multiplanar, multiecho pulse sequences of the brain and surrounding structures were obtained without and with intravenous contrast. CONTRAST:  7.5 mL Gadovist IV COMPARISON:  None. FINDINGS: Brain: Generalized atrophy without hydrocephalus. Mild chronic changes in the white matter. No acute infarct. Negative for hemorrhage or mass. No edema. Normal enhancement postcontrast infusion. No enhancing mass or abscess. Leptomeningeal enhancement normal. Vascular: Normal vascular flow voids. Skull and upper cervical spine: Negative Sinuses/Orbits: Mild mucosal edema paranasal sinuses. Bilateral mastoid effusion. Bilateral cataract surgery Other: None IMPRESSION: No acute intracranial abnormality Mild atrophy and mild chronic microvascular ischemic change in the white matter. Electronically Signed   By: Franchot Gallo M.D.   On: 07/10/2018 12:31   Mr Lumbar Spine W Wo Contrast  Result Date:  07/09/2018 CLINICAL DATA:  Back pain.  Follow-up discitis osteomyelitis. EXAM: MRI LUMBAR SPINE WITHOUT AND WITH CONTRAST TECHNIQUE: Multiplanar and multiecho pulse sequences of the lumbar spine were obtained without and with intravenous contrast. CONTRAST:  7.7 cc Gadavist COMPARISON:  MRI of the lumbar spine June 16, 2018. FINDINGS: SEGMENTATION: For the purposes of this report, the last well-formed intervertebral disc is reported as L5-S1. ALIGNMENT: Maintained lumbar lordosis. Grade 1 L4-5 anterolisthesis without spondylolysis. VERTEBRAE: Slight interval expansion of L1-2 disc height loss with worsening rim enhancing fluid extending into the prevertebral and epidural space. Diffusely low T1 and bright T2 signal within the L1 and L2 vertebral bodies. Remaining lumbar vertebral bodies and disc spaces intact. Scattered focal fat versus hemangiomas. CONUS MEDULLARIS AND CAUDA EQUINA: Conus medullaris deformity and faint suspected cord edema  at L1-2. No nerve root clumping or leptomeningeal enhancement. PARASPINAL AND OTHER SOFT TISSUES:Moderately motion degraded axial sequences. Epidural abscess from T12-L2 0.6 x 0.7 x 6.3 cm (transverse by AP by cc) ventral epidural abscess. Dorsal epidural enhancement consistent with infection without focal fluid. Rim enhancing abscess contiguous with RIGHT crus of the diaphragm measuring to 2.1 cm with prevertebral phlegmon from T11-12 through L2-3. Bilateral iliopsoas muscle myositis without focal fluid collection though axial sequences motion degraded. Subcentimeter layering gallstones. DISC LEVELS: T11-12: Ventral epidural abscess measuring to 3 mm without canal stenosis or neural foraminal narrowing. T12-L1: Ventral epidural abscess measuring to 4 mm. Mild facet arthropathy and ligamentum flavum redundancy. Mild canal stenosis is slightly narrowed LEFT lateral recess which may affect the traversing LEFT L1 nerve. No neural foraminal narrowing. L1-2: Ventral epidural  abscess. Enhancement within RIGHT facet. Moderate canal stenosis with narrowed lateral recesses which may affect the traversing L2 nerve. Soft tissue resulting in moderate to severe neural foraminal narrowing. L2-3: No disc bulge, canal stenosis nor neural foraminal narrowing. Mild facet arthropathy. L3-4: Annular bulging asymmetric to the RIGHT, mild facet arthropathy. No canal stenosis. Mild RIGHT neural foraminal narrowing. L4-5: Anterolisthesis. Annular bulging, moderate to severe facet arthropathy and ligamentum flavum redundancy with bilateral facet effusions, enhancing on the LEFT. No canal stenosis. Moderate RIGHT neural foraminal narrowing. L5-S1: No disc bulge, canal stenosis nor neural foraminal narrowing. Mild facet arthropathy. IMPRESSION: 1. 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 Prevertebral abscess contiguous with RIGHT crus of the diaphragm measuring to 2.1 cm. Iliopsoas myositis. 2. Worsening moderate canal stenosis L1-2 due to epidural collection. Moderate to severe L1-2 neural foraminal effacement. 3. Worsening LEFT L4-5 facet enhancement may be reactive or infectious. Electronically Signed   By: Elon Alas M.D.   On: 07/09/2018 15:05   Mr Lumbar Spine W Wo Contrast  Result Date: 06/16/2018 CLINICAL DATA:  Low back pain. Findings concerning for L1-2 discitis-osteomyelitis on abdominal CT. EXAM: MRI LUMBAR SPINE WITHOUT AND WITH CONTRAST TECHNIQUE: Multiplanar and multiecho pulse sequences of the lumbar spine were obtained without and with intravenous contrast. CONTRAST:  7 mL Gadavist COMPARISON:  CT abdomen and pelvis 06/16/2018 FINDINGS: Segmentation:  Standard. Alignment:  Normal. Vertebrae: There are erosive changes involving the L2 superior greater than L1 inferior endplates with extensive marrow edema and enhancement in the L2 greater than L1 vertebral bodies. There is asymmetric moderate disc space loss at L1-2. There is at most minimal ventral  epidural phlegmon at L1-2 without evidence of epidural abscess. Bone marrow signal elsewhere is mildly heterogeneous without a suspicious focal lesion identified. No fracture is evident. Conus medullaris and cauda equina: Conus extends to the upper L2 level. Conus and cauda equina appear normal. Paraspinal and other soft tissues: Extensive right-sided and milder left-sided paravertebral soft tissue inflammation centered at L1-2. Partially visualized rim enhancing fluid collections on the right extending from L1-2 superiorly into the region of the crus of the right hemidiaphragm with the larger collection measuring approximately 5 cm in length. Disc levels: T12-L1: Negative. L1-2: Discitis-osteomyelitis as described above. Mild left neural foraminal narrowing. No spinal stenosis. L2-3: Mild left facet hypertrophy without disc herniation or stenosis. L3-4: Minimal disc bulging and mild facet hypertrophy without stenosis. L4-5: Disc bulging greater to the right and moderate facet hypertrophy result in mild right neural foraminal stenosis without spinal stenosis. Left larger than right facet joint effusions. L5-S1: Mild facet arthrosis without stenosis. IMPRESSION: L1-2 discitis-osteomyelitis with extensive phlegmon and abscess in the right-sided paravertebral soft  tissues. Electronically Signed   By: Logan Bores M.D.   On: 06/16/2018 17:00   Mr Foot Right Wo Contrast  Result Date: 06/18/2018 CLINICAL DATA:  Right forefoot pain and swelling in a diabetic patient for 6 weeks. No known injury. EXAM: MRI OF THE RIGHT FOREFOOT WITHOUT CONTRAST TECHNIQUE: Multiplanar, multisequence MR imaging of the right forefoot was performed. No intravenous contrast was administered. COMPARISON:  Plain films right foot 06/16/2018 FINDINGS: Bones/Joint/Cartilage No acute bony or joint abnormality is identified. There is no bone marrow signal change to suggest osteomyelitis. Severe degenerative disease about the tarsometatarsal joints  is worst at the first TMT joint. Ligaments Intact. Muscles and Tendons Intact. No intramuscular fluid collection. Fatty atrophy of intrinsic musculature the foot is noted. Soft tissues Small volume of fluid subjacent to the first tarsometatarsal joint measures 1 cm transverse by 1.2 cm craniocaudal by 1.1 cm long and likely represents an adventitial bursa. IMPRESSION: Negative for acute bony or joint abnormality. Advanced osteoarthritis about the tarsometatarsal joints appears worst at the first TMT joint. Small volume of fluid in the subcutaneous tissues deep to the first TMT joint likely represents adventitial bursa formation. Abscess is possible but thought less likely. Electronically Signed   By: Inge Rise M.D.   On: 06/18/2018 10:24   US Arterial Abi (screening Lower Extremity)  Result Date: 06/17/2018 CLINICAL DATA:  Diabetes, right medial diabetic wound ulcer x4 days. Hypertension, hyperlipidemia. EXAM: NONINVASIVE PHYSIOLOGIC VASCULAR STUDY OF BILATERAL LOWER EXTREMITIES TECHNIQUE: Evaluation of both lower extremities were performed at rest, including calculation of ankle-brachial indices with single level Doppler, pressure and pulse volume recording. COMPARISON:  None. FINDINGS: Right ABI:  1.06 Left ABI:  1.0 Right Lower Extremity:  Normal arterial waveforms at the ankle. Left Lower Extremity:  Normal arterial waveforms at the ankle. IMPRESSION: No evidence of hemodynamically significant lower extremity arterial occlusive disease at rest. Electronically Signed   By: Lucrezia Europe M.D.   On: 06/17/2018 10:16   Dg Chest Port 1 View  Result Date: 06/21/2018 CLINICAL DATA:  Post PICC placement. EXAM: PORTABLE CHEST 1 VIEW COMPARISON:  06/20/2018 and older exams. FINDINGS: New right-sided PICC tip projects in the lower superior vena cava near the caval atrial junction. There is opacity the left lung base, that appears increased from the previous day's study although the difference may be due to  differences in patient positioning only. Mild opacity along minor fissure and at the right lung base is stable, likely atelectasis with small effusion. Suspect a left pleural effusion also with atelectasis versus pneumonia. Remainder of the lungs is clear.  No pneumothorax. IMPRESSION: 1. Right PICC tip projects in the lower superior vena cava near the caval atrial junction, well positioned. 2. Possible increase in lung base opacity on the left. Suspect a combination of pleural fluid with either atelectasis or pneumonia. 3. No convincing residual pulmonary edema. Electronically Signed   By: Lajean Manes M.D.   On: 06/21/2018 19:56   Dg Chest Port 1 View  Result Date: 06/20/2018 CLINICAL DATA:  Shortness of breath. EXAM: PORTABLE CHEST 1 VIEW COMPARISON:  Chest x-ray 06/07/2018. FINDINGS: Patient is rotated to the right. Mediastinal widening noted., this is most likely vascular related to AP portable technique. Cardiomegaly with bilateral interstitial prominence and small bilateral pleural effusions. Findings suggest CHF. Bilateral mid lung field subsegmental atelectasis. No pneumothorax. IMPRESSION: Findings consistent with congestive heart failure bilateral interstitial edema bilateral pleural effusions. Pneumonitis can not be excluded. Electronically Signed   By: Marcello Moores  Register   On: 06/20/2018 11:37   Dg Foot Complete Right  Result Date: 06/16/2018 CLINICAL DATA:  68 year old female with foot ulcer EXAM: RIGHT FOOT COMPLETE - 3+ VIEW COMPARISON:  None. FINDINGS: Circumferential soft tissue swelling of the forefoot. No radiopaque foreign body. No lytic changes visualized. Degenerative changes of the interphalangeal joints. Neuropathic midfoot changes including sclerotic changes, disruption of the Lisfranc joint/disorganization, loss of normal joint spaces of the cuneiform is a and cuboid as well as the cuboid navicular joint. Degenerative changes of the hindfoot. IMPRESSION: Circumferential soft tissue  swelling of the forefoot, should be amenable to direct inspection. Neuropathic changes of the midfoot, including disruption of the Lisfranc joint. Electronically Signed   By: Corrie Mckusick D.O.   On: 06/16/2018 19:44   Dg C-arm 1-60 Min  Result Date: 07/11/2018 CLINICAL DATA:  Thoracolumbar fusion.  Spinal infection. EXAM: THORACOLUMBAR SPINE 1V COMPARISON:  Lumbar MRI 07/09/2018 FINDINGS: C-arm images were obtained of the thoracic and lumbar spine. Bilateral pedicle screw and posterior rod fusion extending from T10 through L3. Endplate erosion at H0-6 consistent with infection IMPRESSION: Pedicle screw and posterior rod fusion T10 through L3. Electronically Signed   By: Franchot Gallo M.D.   On: 07/11/2018 19:30   Ct Renal Stone Study  Result Date: 06/16/2018 CLINICAL DATA:  Low back pain radiating into the stomach.  Nausea. EXAM: CT ABDOMEN AND PELVIS WITHOUT CONTRAST TECHNIQUE: Multidetector CT imaging of the abdomen and pelvis was performed following the standard protocol without IV contrast. COMPARISON:  None. FINDINGS: Lower chest: Heart size is mildly enlarged. Calcific coronary artery calcifications are identified. Mild dependent atelectasis noted. No pleural or pericardial effusion. Hepatobiliary: Multiple small stones are seen layering dependently within the gallbladder. There is no inflammatory change about the gallbladder. The liver and biliary tree appear normal. Pancreas: Unremarkable. No pancreatic ductal dilatation or surrounding inflammatory changes. The pancreas is atrophic. Spleen: Normal in size without focal abnormality. Adrenals/Urinary Tract: Small, benign left adrenal adenoma is noted. Right adrenal gland appears normal. The kidneys, ureters and urinary bladder are unremarkable. No hydronephrosis or stones. Stomach/Bowel: Stomach is within normal limits. Appendix appears normal. No evidence of bowel wall thickening, distention, or inflammatory changes. Vascular/Lymphatic: Aortic  atherosclerosis. No enlarged abdominal or pelvic lymph nodes. Reproductive: Status post hysterectomy. No adnexal masses. Other: Trace amount of presacral fluid is noted. Musculoskeletal: There is endplate destructive change at L1-2, much worse in the superior endplate of L2. Surrounding paraspinous inflammatory change is worse on the right. Imaged bones otherwise appear normal. IMPRESSION: Findings highly suspicious for discitis and osteomyelitis at L1-2 with associated paraspinous inflammatory change. MRI of the lumbar spine with and without contrast is recommended for further evaluation. Cardiomegaly. Gallstones without evidence of cholecystitis. Calcific aortic and coronary atherosclerosis. These results were called by telephone at the time of interpretation on 06/16/2018 at 3:39 pm to Dr. Nanda Quinton , who verbally acknowledged these results. Electronically Signed   By: Inge Rise M.D.   On: 06/16/2018 15:40   Vas [place] Lower Extremity Venous (dvt)  Result Date: 06/27/2018  Lower Venous Study Indications: Edema.  Performing Technologist: Toma Copier RVS  Examination Guidelines: A complete evaluation includes B-mode imaging, spectral Doppler, color Doppler, and power Doppler as needed of all accessible portions of each vessel. Bilateral testing is considered an integral part of a complete examination. Limited examinations for reoccurring indications may be performed as noted.  Right Venous Findings: +---------+---------------+---------+-----------+----------+-------------------+          CompressibilityPhasicitySpontaneityPropertiesSummary             +---------+---------------+---------+-----------+----------+-------------------+  CFV      Full                                                             +---------+---------------+---------+-----------+----------+-------------------+ SFJ      Full                                                              +---------+---------------+---------+-----------+----------+-------------------+ FV Prox  Full                                                             +---------+---------------+---------+-----------+----------+-------------------+ FV Mid   Full                                                             +---------+---------------+---------+-----------+----------+-------------------+ FV DistalFull                                                             +---------+---------------+---------+-----------+----------+-------------------+ PFV      Full                                                             +---------+---------------+---------+-----------+----------+-------------------+ POP      Full                                                             +---------+---------------+---------+-----------+----------+-------------------+ PTV      Full                                                             +---------+---------------+---------+-----------+----------+-------------------+ PERO                                                  Difficult to image  due to patient                                                            beimng unale to                                                           rotate the leg for                                                        optimal imaging     +---------+---------------+---------+-----------+----------+-------------------+  Right Technical Findings: Not visualized segments include Peroneal. See summary comments above  Left Venous Findings: +---------+---------------+---------+-----------+----------+-------------------+          CompressibilityPhasicitySpontaneityPropertiesSummary             +---------+---------------+---------+-----------+----------+-------------------+ CFV      Full           Yes      Yes                                       +---------+---------------+---------+-----------+----------+-------------------+ SFJ      Full                                                             +---------+---------------+---------+-----------+----------+-------------------+ FV Prox  Full           Yes      Yes                                      +---------+---------------+---------+-----------+----------+-------------------+ FV Mid   Full                                                             +---------+---------------+---------+-----------+----------+-------------------+ FV DistalFull           Yes      Yes                                      +---------+---------------+---------+-----------+----------+-------------------+ PFV      Full           Yes      Yes                                      +---------+---------------+---------+-----------+----------+-------------------+  POP      Full           Yes      Yes                                      +---------+---------------+---------+-----------+----------+-------------------+ PTV      Full                                                             +---------+---------------+---------+-----------+----------+-------------------+ PERO                                                  Color flow appears                                                        normal however                                                            difficult to image                                                        due to edema        +---------+---------------+---------+-----------+----------+-------------------+  Left Technical Findings: Not visualized segments include limited peroneal due to edema. See summary comments listed above   Summary: Right: There is no evidence of deep vein thrombosis in the lower extremity. However, portions of this examination were limited- see technologist comments above.  No cystic structure found in the popliteal fossa. Left: There is no evidence of deep vein thrombosis in the lower extremity. However, portions of this examination were limited- see technologist comments above. No cystic structure found in the popliteal fossa.  *See table(s) above for measurements and observations. Electronically signed by Monica Martinez MD on 06/27/2018 at 3:48:45 PM.    Final    [place] Ekg Site Rite  Result Date: 06/21/2018 If Site Rite image not attached, placement could not be confirmed due to current cardiac rhythm.  Ir Lumbar Disc Aspiration W/img Guide  Result Date: 06/18/2018 INDICATION: Diabetes, bacteremia, low back pain, suspected discitis L1-2 on recent MR. EXAM: LUMBAR L1-2 DISC ASPIRATION UNDER FLUOROSCOPY MEDICATIONS: Lidocaine 1% subcutaneous ANESTHESIA/SEDATION: Intravenous Fentanyl and Versed were administered as conscious sedation during continuous monitoring of the patient's level of consciousness and physiological / cardiorespiratory status by the radiology RN, with a total moderate sedation time of 15 minutes. PROCEDURE: Informed written consent was obtained from the patient after a thorough discussion of the procedural risks, benefits and alternatives. All questions were addressed. Maximal  Sterile Barrier Technique was utilized including caps, mask, sterile gowns, sterile gloves, sterile drape, hand hygiene and skin antiseptic. A timeout was performed prior to the initiation of the procedure. Patient placed prone. The appropriate interspace was identified under fluoroscopy, corresponding to previous cross-sectional imaging. An appropriate skin entry site was determined. After local infiltration with 1% lidocaine, a 17 gauge trocar needle was advanced into the lumbar L1-2 interspace from left posterolateral extraforaminal approach. Needle tip position within the interspace confirmed on biplane images. Approximately 2 mL of thin purulent-appearing material were aspirated,  sent for the requested laboratory studies. Towards the end of the case, accurate O2 sats were not being displayed. Bradycardia was identified. The patient was urgently turned supine onto the stretcher. Apnea was noted. Resuscitation efforts were initiated, including Narcan, with return of spontaneous breathing, good circulation, patient able to follow commands, pain-free. Patient was transferred to the ICU for observation. FLUOROSCOPY TIME:  54 seconds; 57 mGy COMPLICATIONS: Transient apnea and bradycardia, presumably sedation related. SIR level B: Nominal therapy (including overnight admission for observation), no consequence. IMPRESSION: Technically successful lumbar L1-2 disc aspiration under fluoroscopy. Electronically Signed   By: Lucrezia Europe M.D.   On: 06/18/2018 16:03    Microbiology: Recent Results (from the past 240 hour(s))  Aerobic/Anaerobic Culture (surgical/deep wound)     Status: None   Collection Time: 07/11/18  4:57 PM  Result Value Ref Range Status   Specimen Description TISSUE  Final   Special Requests L1 2 DISC SPACE  Final   Gram Stain NO WBC SEEN NO ORGANISMS SEEN   Final   Culture   Final    RARE STAPHYLOCOCCUS AUREUS CRITICAL RESULT CALLED TO, READ BACK BY AND VERIFIED WITH: C. MITCHELL,  RN CONCERNING CULTURE GROWTH AT 4709 ON 07/12/18 BY C. JESSUP, MLT. NO ANAEROBES ISOLATED Performed at [facility], [place] [address], [place], [place] [address]    Report Status 07/16/2018 FINAL  Final   Organism ID, Bacteria STAPHYLOCOCCUS AUREUS  Final      Susceptibility   Staphylococcus aureus - MIC*    CIPROFLOXACIN <=0.5 SENSITIVE Sensitive     ERYTHROMYCIN >=8 RESISTANT Resistant     GENTAMICIN <=0.5 SENSITIVE Sensitive     OXACILLIN 0.5 SENSITIVE Sensitive     TETRACYCLINE <=1 SENSITIVE Sensitive     VANCOMYCIN 1 SENSITIVE Sensitive     TRIMETH/SULFA <=10 SENSITIVE Sensitive     CLINDAMYCIN <=0.25 SENSITIVE Sensitive     RIFAMPIN <=0.5 SENSITIVE Sensitive      Inducible Clindamycin NEGATIVE Sensitive     * RARE STAPHYLOCOCCUS AUREUS  Aerobic/Anaerobic Culture (surgical/deep wound)     Status: None   Collection Time: 07/11/18  4:58 PM  Result Value Ref Range Status   Specimen Description WOUND  Final   Special Requests L1 2 DISC SPACE  Final   Gram Stain   Final    RARE WBC PRESENT, PREDOMINANTLY PMN NO ORGANISMS SEEN    Culture   Final    No growth aerobically or anaerobically. Performed at [facility], [place] [address] [place],  [address]    Report Status 07/16/2018 FINAL  Final     Labs: Basic Metabolic Panel: Recent Labs  Lab 07/12/18 1143 07/13/18 0950 07/14/18 0755  NA 130* 129* 131*  K 4.5 4.4 3.9  CL 94* 98 100  CO2 _0 GLUCOSE 439* 323* 199*  BUN _1 CREATININE 0.65 0.56 0.45  CALCIUM 8.3* 8.0* 8.1*  Liver Function Tests: No results for input(s): AST, ALT, ALKPHOS, BILITOT, PROT, ALBUMIN in the last 168 hours. No results for input(s): LIPASE, AMYLASE in the last 168 hours. Recent Labs  Lab 07/14/18 0755  AMMONIA 21   CBC: Recent Labs  Lab 07/12/18 0722 07/13/18 0613 07/14/18 0755  WBC 6.1 6.7 6.3  HGB 7.6* 10.0* 9.9*  HCT 25.6* 30.9* 30.9*  MCV 104.5* 95.7 97.2  PLT 301 288 304   Cardiac Enzymes: No results for input(s): CKTOTAL, CKMB, CKMBINDEX, TROPONINI in the last 168 hours. BNP: BNP (last 3 results) No results for input(s): BNP in the last 8760 hours.  ProBNP (last 3 results) No results for input(s): PROBNP in the last 8760 hours.  CBG: Recent Labs  Lab 07/15/18 1121 07/15/18 1614 07/15/18 2141 07/16/18 0829 07/16/18 1141  GLUCAP 194* 273* 84 179* 138*       Signed:  Nita Sells MD   [facility] 07/16/2018, 12:35 PM

## 2018-07-16 NOTE — Progress Notes (Signed)
VAST RN paged to call unit; spoke with Erline Levine, RN for pt regarding labs ordered with conflicting information (tomorrow am labs, 2/26 @ 1753). Erline Levine confirmed labs were indeed for am draw, not this evening.

## 2018-07-16 NOTE — Progress Notes (Signed)
Inpatient Rehabilitation Admissions Coordinator  I have insurance approval to readmit pt to inpt rehab today. I have contacted Dr. Verlon Au for d/c order, RN CM , SW and RN. I will make the arrangements to admit today.  Danne Baxter, RN, MSN Rehab Admissions Coordinator [phone] 07/16/2018 12:39 PM

## 2018-07-16 NOTE — Progress Notes (Signed)
Occupational Therapy Treatment Patient Details Name: Kaitlin Vargas MRN: [id number] DOB: 1950/08/05 Today's Date: 07/16/2018    History of present illness Pt is a 68 y.o. female who was admitted to [facility] 06/16/18 with back pain. 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 She transferred to [facility] for lumbar puncture on 1/29; during procedure, pt with active bleeding and cardiac arrest requiring CPR.  On 06/26/2018 she was transferred to inpatient rehab to continue inpatient rehab with IV antibiotics and PICC line on right arm. Abscess not responding to antibiotics. Pt with continued pain and inability to ambulate. On 07/10/18 pt re-admitted to acute care. She underwent posterior fixation T10-L3 07/11/18.  Post op anemia requiring PRBCs on 07/12/18.  PMH includes peripheral neuropathy, HTN, DM.     OT comments  Patient progressing slowly.  Transfers with mod assist, continues to require cueing for hand placement and technique. Continues to require total assist for toileting and LB dressing, standing with B UE support given min assist for balance.  Patient presents with improved cognition, attending to tasks and following commands consistently. Cueing for precautions during functional tasks, will benefit from AE training.d Will follow, CIR remains appropriate.     Follow Up Recommendations  CIR;Supervision/Assistance - 24 hour    Equipment Recommendations  Other (comment)(TBD )    Recommendations for Other Services Rehab consult    Precautions / Restrictions Precautions Precautions: Fall;Back Precaution Comments: reviewed with patient, cueing to adhere to during tasks  Required Braces or Orthoses: Spinal Brace Spinal Brace: Applied in sitting position(on upon entry, required adjustment in sitting ) Restrictions Weight Bearing Restrictions: No       Mobility Bed Mobility               General bed mobility comments: OOB in recliner upon entry '  Transfers Overall  transfer level: Needs assistance Equipment used: Rolling walker (2 wheeled) Transfers: Sit to/from Stand Sit to Stand: Mod assist         General transfer comment: mod assist to power up into standing, cueing for hand placement and safety     Balance Overall balance assessment: Needs assistance Sitting-balance support: Bilateral upper extremity supported;Feet supported Sitting balance-Leahy Scale: Fair Sitting balance - Comments: statically   Standing balance support: Bilateral upper extremity supported;During functional activity Standing balance-Leahy Scale: Poor Standing balance comment: dependent on RW and external support                           ADL either performed or assessed with clinical judgement   ADL Overall ADL's : Needs assistance/impaired     Grooming: Wash/dry hands;Wash/dry face;Oral care;Sitting;Minimal assistance Grooming Details (indicate cue type and reason): seated on 3:1, requires assist to brush hair              Lower Body Dressing: Total assistance;Sit to/from stand Lower Body Dressing Details (indicate cue type and reason): requires assist to adjust socks and don shoes, min assist in standing  Toilet Transfer: Moderate assistance;Ambulation;BSC;RW Toilet Transfer Details (indicate cue type and reason): to 3:1 commode  Toileting- Clothing Manipulation and Hygiene: Total assistance;Sit to/from stand Toileting - Clothing Manipulation Details (indicate cue type and reason): hygiene assist and clothing mgmt, min assist in standing              Vision       Perception     Praxis      Cognition Arousal/Alertness: Awake/alert Behavior During Therapy: [facility] for tasks assessed/performed  Overall Cognitive Status: Impaired/Different from baseline Area of Impairment: Safety/judgement;Problem solving;Memory                     Memory: Decreased recall of precautions;Decreased short-term memory   Safety/Judgement: Decreased  awareness of safety;Decreased awareness of deficits   Problem Solving: Slow processing;Requires verbal cues;Difficulty sequencing;Requires tactile cues General Comments: patient continues to require cueing to probelm sovling, sequencing and safety; limited recall of precautons         Exercises     Shoulder Instructions       General Comments VSS    Pertinent Vitals/ Pain       Pain Assessment: 0-10 Pain Score: 5  Pain Location: low back Pain Descriptors / Indicators: Grimacing Pain Intervention(s): Monitored during session;Repositioned  Home Living                                          Prior Functioning/Environment              Frequency  Min 2X/week        Progress Toward Goals  OT Goals(current goals can now be found in the care plan section)  Progress towards OT goals: Progressing toward goals  Acute Rehab OT Goals Patient Stated Goal: decrease pain OT Goal Formulation: With patient Time For Goal Achievement: 07/26/18 Potential to Achieve Goals: Good  Plan Discharge plan remains appropriate;Frequency remains appropriate    Co-evaluation                 AM-PAC OT "6 Clicks" Daily Activity     Outcome Measure   Help from another person eating meals?: None Help from another person taking care of personal grooming?: A Little Help from another person toileting, which includes using toliet, bedpan, or urinal?: Total Help from another person bathing (including washing, rinsing, drying)?: A Lot Help from another person to put on and taking off regular upper body clothing?: A Little Help from another person to put on and taking off regular lower body clothing?: Total 6 Click Score: 14    End of Session Equipment Utilized During Treatment: Rolling walker;Gait belt;Back brace  OT Visit Diagnosis: Unsteadiness on feet (R26.81);Pain;Other symptoms and signs involving cognitive function;Muscle weakness (generalized) (M62.81) Pain -  part of body: (back)   Activity Tolerance Patient tolerated treatment well   Patient Left in chair;with call bell/phone within reach;with chair alarm set;with nursing/sitter in room   Nurse Communication Mobility status        Time: 1432-1510 OT Time Calculation (min): 38 min  Charges: OT General Charges $OT Visit: 1 Visit OT Treatments $Self Care/Home Management : 38-52 mins  Delight Stare, [place] Pager [phone] Office [phone]    Delight Stare 07/16/2018, 3:58 PM

## 2018-07-16 NOTE — Evaluation (Signed)
Occupational Therapy Assessment and Plan  Patient Details  Name: Kaitlin Vargas MRN: [id number] Date of Birth: 04/25/1951  OT Diagnosis: lumbago (low back pain) and muscle weakness (generalized) Rehab Potential: Rehab Potential (ACUTE ONLY): Good ELOS: 2 weeks   Today's Date: 07/17/2018 Session 1 OT Individual Time: 6945-0388 OT Individual Time Calculation (min): 60 min   Problem List:  Patient Active Problem List   Diagnosis Date Noted  . Drug induced constipation   . Hypoalbuminemia due to protein-calorie malnutrition ([place])   . Diabetes mellitus type 2 in obese ([place])   . Vitamin B12 deficiency   . Urinary retention   . Diabetic peripheral neuropathy ([place])   . Postoperative pain   . 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 S/P lumbar fusion 07/11/2018  . Epidural abscess   . Sinus tachycardia   . Hyponatremia   . Labile blood glucose   . Acute on chronic anemia   . Bacteremia   . Lumbar discitis 06/26/2018  . Cardiac arrest, cause unspecified ([place])   . Acute encephalopathy   . MSSA bacteremia 06/17/2018  . 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 DKA (diabetic ketoacidosis) ([place]) 06/16/2018  . Diabetic foot ulcer ([place]) 06/16/2018  . Acute hypoxemic respiratory failure ([place]) 06/07/2018  . Essential hypertension 06/07/2018  . Type 2 diabetes mellitus with hyperlipidemia ([place]) 06/07/2018  . Anemia 06/07/2018  . Acute bronchitis 06/07/2018    Past Medical History:  Past Medical History:  Diagnosis Date  . Anemia   . Diabetes mellitus without complication ([place])   . Hyperlipidemia   . Hypertension   . Peripheral neuropathy    Past Surgical History:  Past Surgical History:  Procedure Laterality Date  . ABDOMINAL HYSTERECTOMY    . IR LUMBAR DISC ASPIRATION W/IMG GUIDE  06/18/2018  . TEE WITHOUT CARDIOVERSION N/A 07/15/2018   Procedure: TRANSESOPHAGEAL ECHOCARDIOGRAM (TEE);  Surgeon: Lelon Perla, MD;  Location: [facility] ENDOSCOPY;  Service: Cardiovascular;  Laterality: N/A;     Assessment & Plan Clinical Impression: Patient is a 68 y.o. year old female with recent admission to the hospital on 06/16/18 with back pain. 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 She transferred to [facility] for lumbar puncture on 1/29; during procedure, pt with active bleeding and cardiac arrest requiring CPR.  On 06/26/2018 she was transferred to inpatient rehab to continue inpatient rehab with IV antibiotics and PICC line on right arm. Abscess not responding to antibiotics. Pt with continued pain and inability to ambulate. On 07/10/18 pt re-admitted to acute care. She underwent posterior fixation T10-L3 07/11/18.  Post op anemia requiring PRBCs on 07/12/18.  PMH includes peripheral neuropathy, HTN, DM.  Patient transferred to CIR on 07/16/2018 .    Patient currently requires max with basic self-care skills secondary to muscle weakness, decreased activity tolerance and decreased standing balance, decreased postural control, decreased balance strategies and difficulty maintaining precautions.  Prior to hospitalization, patient could complete BADL with independent .  Patient will benefit from skilled intervention to increase independence with basic self-care skills prior to discharge home with care partner.  Anticipate patient will require 24 hour supervision and follow up home health.  OT - End of Session Endurance Deficit: Yes Endurance Deficit Description: Multiple rest breaks within BADL tasks OT Assessment Rehab Potential (ACUTE ONLY): Good OT Patient demonstrates impairments in the following area(s): Balance;Endurance;Motor;Pain;Sensory OT Basic ADL's Functional Problem(s): Grooming;Bathing;Dressing;Toileting;Eating OT Transfers Functional Problem(s): Toilet;Tub/Shower OT Additional Impairment(s): None OT Plan OT Intensity: Minimum of 1-2 x/day, 45 to 90 minutes  OT Frequency: 5 out of 7 days OT Duration/Estimated Length of Stay: 2 weeks OT Treatment/Interventions:  Balance/vestibular training;Community reintegration;Discharge planning;Disease mangement/prevention;DME/adaptive equipment instruction;Functional mobility training;Neuromuscular re-education;Pain management;Patient/family education;Psychosocial support;Self Care/advanced ADL retraining;Therapeutic Activities;Therapeutic Exercise;UE/LE Strength taining/ROM;UE/LE Coordination activities;Wheelchair propulsion/positioning OT Self Feeding Anticipated Outcome(s): Independent OT Basic Self-Care Anticipated Outcome(s): Supervision OT Toileting Anticipated Outcome(s): Supervision OT Bathroom Transfers Anticipated Outcome(s): Supervision OT Recommendation Patient destination: Home Follow Up Recommendations: Home health OT Equipment Recommended: To be determined Equipment Details: Likely a tub transfer bench   Skilled Therapeutic Intervention Initial eval completed with treatment provided to address functional transfers, back precautions, improved sit<>stand, standing tolerance, and adapted bathing/dressing skills. OT reviewed back precautions with pt able to state 1/3. Pt reported need for bathroom and completed bed mobility with mod A. TLSO donned sitting EOB, then stand-pivot to BSC with mod A. Pt voided bladder. Pt then pivoted to wc with mod A. Bathing/dressing completed wc at the sink with min/mod A to stand and OT assist to wash buttocks-pt noted to be incontinent of smear of BM. Total A for LB ADLs. Pt left seated in wc at end of session with safety belt on and needs met.   OT Evaluation Precautions/Restrictions  Precautions Precautions: Fall;Back Required Braces or Orthoses: Spinal Brace Spinal Brace: Applied in sitting position Restrictions Weight Bearing Restrictions: No Other Position/Activity Restrictions: (RN reports she plans to follow up with orthotech ) Pain Pain Assessment Pain Scale: 0-10 Pain Score: 5  Pain Type: Acute pain;Surgical pain Pain Location: Back Pain Orientation:  Mid;Lower Pain Descriptors / Indicators: Aching Pain Frequency: Constant Pain Onset: On-going Pain Intervention(s): Repositioned Home Living/Prior Functioning Home Living Family/patient expects to be discharged to:: Private residence Living Arrangements: Spouse/significant other Available Help at Discharge: Family, Available 24 hours/day Type of Home: House Home Access: Ramped entrance Home Layout: One level Bathroom Shower/Tub: Chiropodist: Standard Bathroom Accessibility: Yes Additional Comments: Ambulated with cane and RW PTA  Lives With: Spouse, Daughter IADL History Leisure and Hobbies: Enjoys going to shops and out to eat IADL Comments: Per pt report, independent with homemaking Prior Function Level of Independence: Independent with basic ADLs, Independent with homemaking with ambulation  Able to Take Stairs?: No Driving: No Vocation: Retired ADL ADL Eating: Set up Grooming: Setup Upper Body Bathing: Minimal assistance Lower Body Bathing: Maximal assistance Upper Body Dressing: Minimal assistance Lower Body Dressing: Dependent Toileting: Dependent Toilet Transfer: Maximal assistance Toilet Transfer Method: Stand pivot Vision Baseline Vision/History: Wears glasses Wears Glasses: Reading only Patient Visual Report: No change from baseline Perception  Perception: Within Functional Limits Praxis Praxis: Intact Cognition Overall Cognitive Status: Within Functional Limits for tasks assessed Arousal/Alertness: Awake/alert Orientation Level: Person;Place;Situation Person: Oriented Place: Oriented Situation: Oriented Year: 2020 Month: February Day of Week: Incorrect Memory: Appears intact Immediate Memory Recall: Sock;Blue;Bed Memory Recall: Sock;Blue;Bed Memory Recall Sock: Without Cue Memory Recall Blue: Without Cue Memory Recall Bed: With Cue Awareness: Appears intact Safety/Judgment: Appears intact Sensation Sensation Light Touch:  Impaired Detail Light Touch Impaired Details: Impaired RLE;Impaired LLE Proprioception: Impaired Detail Proprioception Impaired Details: Impaired RLE;Impaired LLE Coordination Gross Motor Movements are Fluid and Coordinated: Yes Fine Motor Movements are Fluid and Coordinated: Yes Motor  Motor Motor - Skilled Clinical Observations: generalized weakness Mobility  Transfers Sit to Stand: Moderate Assistance - Patient 50-74% Stand to Sit: Moderate Assistance - Patient 50-74%  Trunk/Postural Assessment  Cervical Assessment Cervical Assessment: (fwd head) Thoracic Assessment Thoracic Assessment: (mild kyphosis) Lumbar Assessment Lumbar Assessment: (posterior pelvic tilt)  Balance Balance Balance Assessed:  Yes Static Standing Balance Static Standing - Balance Support: During functional activity;Bilateral upper extremity supported Static Standing - Level of Assistance: 3: Mod assist Dynamic Standing Balance Dynamic Standing - Balance Support: During functional activity;Bilateral upper extremity supported Dynamic Standing - Level of Assistance: 3: Mod assist Extremity/Trunk Assessment RUE Assessment RUE Assessment: Exceptions to [facility] General Strength Comments: generalized weakness 3+/5 overall LUE Assessment LUE Assessment: Exceptions to [facility] General Strength Comments: generalized weakness 3+/5 overall     Refer to Care Plan for Long Term Goals  Recommendations for other services: None    Discharge Criteria: Patient will be discharged from OT if patient refuses treatment 3 consecutive times without medical reason, if treatment goals not met, if there is a change in medical status, if patient makes no progress towards goals or if patient is discharged from hospital.  The above assessment, treatment plan, treatment alternatives and goals were discussed and mutually agreed upon: by patient  Valma Cava 07/17/2018, 1:34 PM

## 2018-07-16 NOTE — Progress Notes (Signed)
Endocarditis Evaluation   Infective endocarditis (IE) is associated with a high rate of complications and mortality.  Specific aspects of clinical management are critical to optimizing the outcome of patients with IE. Therefore, the Pharmacy Residency Program at [facility] has initiated an evidence-based consult aimed at improving the management of IE at [facility].     Comments  Consult to ID Outpatient follow-up appointment to be made prior to discharge.  Utilization of ID recommended antibiotic therapy   Surgery Evaluation - complete Cardiothoracic surgery consult if the following indications are present:  - Heart failure associated to valve dysfunction - Endocarditis complicated by heart block or aortic abscess - Left-sided endocarditis caused by S. aureus, fungal, or multi-drug resistant organisms - Left-sided endocarditis with severe valve dysfunction - Large vegetation (>1 cm) on aortic or mitral valve - Persistence of infection 5-7 days after initiation of appropriate antibiotic therapy - Recurrent emboli and persistent vegetations despite appropriate antibiotic therapy - Any history with left-sided embolization with persistent vegetations on TEE  Consult electrophysiologist if presence of implanted cardiac device (pacemaker, ICD)   Consult to cardiology if left ventricular ejection fraction is <40% Evaluation for appropriate heart failure medications upon discharge: - ACE/ARB - Diuretic - Beta blocker  Outpatient follow-up appointment to be made prior to discharge.   Based on the pharmacist's evaluation of this patient, the following are recommended to complete a thorough evaluation and care plan to reflect guideline-recommended therapy.  1.   ID follow-up appointment to be made prior to discharge   Vertis Kelch, PharmD PGY1 Pharmacy Resident Phone [phone] 07/16/2018       10:38 AM

## 2018-07-16 NOTE — Care Management Note (Signed)
Case Management Note  Patient Details  Name: Kaitlin Vargas MRN: [id number] Date of Birth: [birth year]  Subjective/Objective:  Pt admitted on 07/10/2018 from inpatient rehab with pain and inability to ambulate.  She underwent posterior fixation T10-L3 07/11/2018.                    Action/Plan: PT/OT recommending return to CIR; CIR admissions coordinator following for possible readmission pending progress with therapies.    Expected Discharge Date:            Expected Discharge Plan:  IP Rehab Facility  In-House Referral:  Clinical Social Work  Discharge planning Services  CM Consult  Post Acute Care Choice:    Choice offered to:     DME Arranged:    DME Agency:     HH Arranged:    [place] Agency:     Status of Service:  Completed, signed off  If discussed at [employer], dates discussed:    Additional Comments:  07/16/2018 J. Gianmarco Roye, Therapist, sports, BSN Pt medically stable for discharge, and insurance authorization received for CIR admission today.  Plan dc to [facility] later today.  Reinaldo Raddle, RN, BSN  Trauma/Neuro ICU Case Manager [phone]

## 2018-07-16 NOTE — H&P (Signed)
Physical Medicine and Rehabilitation Admission H&P    Chief complaint:Back pain  HPI: Kaitlin Vargas a 68 year old right-handed female with history of hypertension, hyperlipidemia, B12 deficiency and diabetes mellitus. Patient with reported back pain 4 weeks. Per chart review and patient, patient lives with spouse. One level home with ramped entrance. Independent with assistive device prior to admission. She recently came to the hospital with cough diagnosed with adenovirus in mid-January received antibiotic coverage recovering nicely was discharged to home. She continued to have back pain radiating to the lower extremities as well as bouts of urinary incontinence and stool incontinence. She presented to [facility] on 06/16/2018 with increasing back pain with lower extremity weakness and a CT scan of the spine showed what appeared to be osteomyelitis. She was transferred to [facility] for interventional radiology completed lumbar puncture and during the procedure noted increase active bleeding as well as cardiac arrest requiring CPR 90 seconds. She did not require intubation. It was felt cardiac arrest likely due to respiratory arrest following sedation. 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 Patient also with right foot ulceration. MRI of the foot negative for acute bony or joint abnormality. Small volume of fluid in the subcutaneous tissue deep to the first TMT joint. Infectious disease consulted patient with GPCs growing from lumbar spine aspirate. Placed on antibiotic therapy. TEE negative for vegetation. She had been placed on Cefazolin per infectious disease through 08/13/2018. Subcutaneous heparin for DVT prophylaxis. Hospital course of pain management maintained on long-acting MS Contin. Acute on chronic anemia 7.6 transfused 2 units of packed red blood cells 07/12/2018 and monitored  closely initial bouts of hyponatremia 130. Therapy evaluations completed and patient was admitted for a comprehensive rehabilitation program to 06/26/2018. Patient was progressing nicely and developed increasing back pain 07/09/2018. 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 Also showed prevertebral abscess. Infectious disease as well as neurosurgery consulted and plan was to discharge to acute care services for ongoing care 07/10/2018. After long discussion between neurosurgery and infectious disease it was decided to explore abscess as patient appeared not to be responding to antibiotics and underwent decompressive lumbar laminectomy lumbar 1-2 left with hemi-facetectomy and sublaminar decompression and discectomy. Disc was sent for biopsy and culture. Posterior fixation T10-L3 using cortical pedicle screws. Intertransverse arthrodesis T10-L3 with bone marrow aspirate obtained through a separate fascial incision over the left iliac crest 07/11/2018 per Dr. Sherley Bounds. Back brace when out of bed applied in sitting position.Pain management with the use of MS Contin monitoring mental status for confusion. Infectious disease follow-up current plan is for 8 weeks of IV cefazolin plus oral rifampin 300 mg twice a day from date of surgery 07/11/2018. TEE completed 07/15/2018 to rule out endocarditis showed a small oscillating density on the aortic side of aortic valve concerning for vegetation.  No further intervention and advised to continue antibiotic therapy as directed.follow-up cardiothoracic surgery in regards to aortic valve endocarditis no plan for surgical intervention. Patient with bouts of urinary retention currently maintained on Urecholine. Therapy evaluations resumed with steady progress. Patient is admitted to inpatient rehabilitation services to continue comprehensive therapies.  Review of Systems  HENT: Negative for hearing loss.    Eyes: Negative for blurred vision and double vision.  Respiratory: Positive for shortness of breath. Negative for cough.   Cardiovascular: Negative for chest pain.  Gastrointestinal: Positive for  constipation. Negative for nausea and vomiting.  Genitourinary: Negative for dysuria, flank pain and hematuria.  Musculoskeletal: Positive for back pain, joint pain and myalgias.  Skin: Negative for rash.  Neurological: Positive for weakness.  All other systems reviewed and are negative.  Past Medical History:  Diagnosis Date  . Anemia   . Diabetes mellitus without complication ([place])   . Hyperlipidemia   . Hypertension   . Peripheral neuropathy    Past Surgical History:  Procedure Laterality Date  . ABDOMINAL HYSTERECTOMY    . IR LUMBAR DISC ASPIRATION W/IMG GUIDE  06/18/2018   Family History  Problem Relation Age of Onset  . Cancer Mother   . Stroke Mother   . Cancer Father   . Leukemia Father    Social History:  reports that she has never smoked. She has never used smokeless tobacco. She reports that she does not drink alcohol or use drugs. Allergies:  Allergies  Allergen Reactions  . Sulfa Antibiotics Rash   Medications Prior to Admission  Medication Sig Dispense Refill  . albuterol (PROVENTIL) (2.5 MG/3ML) 0.083% nebulizer solution Take 3 mLs (2.5 mg total) by nebulization every 2 (two) hours as needed for wheezing or shortness of breath. 75 mL 12  . aspirin 81 MG chewable tablet Chew 81 mg by mouth every morning.     . bethanechol (URECHOLINE) 10 MG tablet Take 1 tablet (10 mg total) by mouth 3 (three) times daily.    . bisacodyl (DULCOLAX) 10 MG suppository Place 1 suppository (10 mg total) rectally daily as needed for moderate constipation. 12 suppository 0  . ceFAZolin (ANCEF) IVPB Inject 2 g into the vein every 8 (eight) hours. Indication: MSSA discitis/osteomyelitis Last Day of Therapy: 08/13/2018 Labs - Once weekly:  CBC/D and BMP, Labs - Every other week:  ESR and CRP  162 Units 0  . furosemide (LASIX) 20 MG tablet Take 2 tablets (40 mg total) by mouth daily.    . heparin 5000 UNIT/ML injection Inject 1 mL (5,000 Units total) into the skin every 8 (eight) hours. 1 mL   . HYDROcodone-acetaminophen (NORCO) 10-325 MG tablet Take 1 tablet by mouth every 4 (four) hours as needed for moderate pain. 30 tablet 0  . insulin aspart (NOVOLOG) 100 UNIT/ML injection Inject 0-9 Units into the skin 3 (three) times daily with meals. 10 mL 11  . insulin glargine (LANTUS) 100 UNIT/ML injection Inject 0.2 mLs (20 Units total) into the skin daily. 10 mL 11  . ketoconazole (NIZORAL) 2 % cream Apply topically 2 (two) times daily. 15 g 0  . methocarbamol (ROBAXIN) 750 MG tablet Take 1 tablet (750 mg total) by mouth every 6 (six) hours.    . metoprolol tartrate (LOPRESSOR) 25 MG tablet Take 1 tablet (25 mg total) by mouth 2 (two) times daily.    Marland Kitchen morphine (MS CONTIN) 30 MG 12 hr tablet Take 1 tablet (30 mg total) by mouth every 12 (twelve) hours.  0  . Multiple Vitamin (THERA) TABS Take 1 tablet by mouth daily.     . pantoprazole (PROTONIX) 40 MG tablet Take 1 tablet (40 mg total) by mouth daily. 30 tablet 1  . senna-docusate (SENOKOT-S) 8.6-50 MG tablet Take 2 tablets by mouth at bedtime.    . vitamin B-12 (CYANOCOBALAMIN) 1000 MCG tablet Take 1 tablet (1,000 mcg total) by mouth daily for 30 days. 30 tablet 0    Drug Regimen Review Drug regimen was reviewed and remains appropriate with no significant issues identified  Home: Home Living Family/patient expects to be discharged to:: Private residence Living Arrangements: Spouse/significant other Available Help at Discharge: Family, Available 24 hours/day Type of Home: House Home Access: [place]: One level Bathroom Shower/Tub: Chiropodist: Standard Bathroom Accessibility: Yes Home Equipment: Environmental consultant - 2 wheels, Cane - single point Additional Comments: Ambulated with cane and RW PTA   Lives With: Spouse, Daughter   Functional History: Prior Function Level of Independence: Independent with assistive device(s)  Functional Status:  Mobility: Bed Mobility Overal bed mobility: Needs Assistance Bed Mobility: Rolling, Sidelying to Sit, Sit to Sidelying Rolling: Min assist(VCs for positioning and sequencing) Sidelying to sit: Min assist(increased time and A for trunk) Sit to sidelying: Mod assist(for legs) General bed mobility comments: pt initiated well, increased time, minA to complete tast Transfers Overall transfer level: Needs assistance Equipment used: Rolling walker (2 wheeled) Transfers: Sit to/from Stand Sit to Stand: Mod assist Stand pivot transfers: Mod assist General transfer comment: mod assist to power up and block feet from sliding out under her, Ambulation/Gait Ambulation/Gait assistance: Mod assist, +2 safety/equipment Gait Distance (Feet): 65 Feet Assistive device: Rolling walker (2 wheeled) Gait Pattern/deviations: Step-through pattern General Gait Details: pt very shaky, requiring modA for walker management, and max diretional v/c's to contiuing stepping insteady of only pushing walker forward Gait velocity: dec Gait velocity interpretation: <1.31 ft/sec, indicative of household ambulator    ADL: ADL Overall ADL's : Needs assistance/impaired Grooming: Oral care, Wash/dry face, Brushing hair, Set up, Supervision/safety, Sitting Grooming Details (indicate cue type and reason): EOB Upper Body Bathing: Minimal assistance, Sitting Lower Body Bathing: Total assistance, +2 for physical assistance, Sit to/from stand Upper Body Dressing : Minimal assistance, Sitting Lower Body Dressing: Total assistance, +2 for physical assistance, Sit to/from stand Toilet Transfer: +2 for physical assistance, +2 for safety/equipment, Stand-pivot, RW, Maximal assistance Toilet Transfer Details (indicate cue type and reason): simulated from recliner to EOB  Functional  mobility during ADLs: Maximal assistance, +2 for physical assistance, +2 for safety/equipment, Rolling walker General ADL Comments: patient requires constant redirection to attend to current task, limited by pain, tolerance and hypotensive during session   Cognition: Cognition Overall Cognitive Status: Impaired/Different from baseline Orientation Level: (P) Oriented X4 Memory: Appears intact Safety/Judgment: Appears intact Cognition Arousal/Alertness: Awake/alert Behavior During Therapy: Impulsive Overall Cognitive Status: Impaired/Different from baseline Area of Impairment: Following commands, Safety/judgement, Problem solving Orientation Level: Disoriented to, Time Current Attention Level: Sustained Memory: Decreased recall of precautions Following Commands: Follows one step commands with increased time Safety/Judgement: Decreased awareness of safety, Decreased awareness of deficits Awareness: Emergent Problem Solving: Difficulty sequencing, Requires verbal cues General Comments: was going to put toothpaste in dentures. When asked if she normally uses fixodent she said no.  Physical Exam: Blood pressure 127/62, pulse 99, temperature 98.6 F (37 C), temperature source Oral, resp. rate 20, height 4' 10" (1.473 m), weight 72.6 kg, SpO2 97 %. Physical Exam  Vitals reviewed. Constitutional: She appears well-developed.  Obese  HENT:  Head: Normocephalic and atraumatic.  Eyes: EOM are normal. Right eye exhibits no discharge. Left eye exhibits no discharge.  Neck: Normal range of motion. Neck supple. No thyromegaly present.  Cardiovascular: Normal rate and regular rhythm.  Respiratory: Effort normal and breath sounds normal.  GI: Soft. Bowel sounds are normal. She exhibits no distension.  Musculoskeletal:     Comments: No edema or tenderness in extremities  Neurological: She is alert.  Makes eye contact with examiner.  Provides her name and age.  She could not recall her full  hospital course. Motor: Grossly 4-5/5 throughout [facility]  Skin:  Back brace in place with dressing due to back incision. Foam dressing in place to right foot  Psychiatric: Her affect is blunt. Her speech is delayed. She is slowed.    Results for orders placed or performed during the hospital encounter of 07/10/18 (from the past 48 hour(s))  Glucose, capillary     Status: Abnormal   Collection Time: 07/14/18  7:48 AM  Result Value Ref Range   Glucose-Capillary 197 (H) 70 - 99 mg/dL  Basic metabolic panel     Status: Abnormal   Collection Time: 07/14/18  7:55 AM  Result Value Ref Range   Sodium 131 (L) 135 - 145 mmol/L   Potassium 3.9 3.5 - 5.1 mmol/L   Chloride 100 98 - 111 mmol/L   CO2 25 22 - 32 mmol/L   Glucose, Bld 199 (H) 70 - 99 mg/dL   BUN 9 8 - 23 mg/dL   Creatinine, Ser 0.45 0.44 - 1.00 mg/dL   Calcium 8.1 (L) 8.9 - 10.3 mg/dL   GFR calc non Af Amer >60 >60 mL/min   GFR calc Af Amer >60 >60 mL/min   Anion gap 6 5 - 15    Comment: Performed at [facility], [place] [address] [place], [place] [address]  Ammonia     Status: None   Collection Time: 07/14/18  7:55 AM  Result Value Ref Range   Ammonia 21 9 - 35 umol/L    Comment: Performed at [facility], [place] [address] [place], [place] [address]  CBC     Status: Abnormal   Collection Time: 07/14/18  7:55 AM  Result Value Ref Range   WBC 6.3 4.0 - 10.5 K/uL   RBC 3.18 (L) 3.87 - 5.11 MIL/uL   Hemoglobin 9.9 (L) 12.0 - 15.0 g/dL   HCT 30.9 (L) 36.0 - 46.0 %   MCV 97.2 80.0 - 100.0 fL   MCH 31.1 26.0 - 34.0 pg   MCHC 32.0 30.0 - 36.0 g/dL   RDW 18.7 (H) 11.5 - 15.5 %   Platelets 304 150 - 400 K/uL   nRBC 0.0 0.0 - 0.2 %    Comment: Performed at [facility], [place] [address], [place], [place] [address]  Glucose, capillary     Status: Abnormal   Collection Time: 07/14/18 11:36 AM  Result Value Ref Range   Glucose-Capillary 198 (H) 70 - 99 mg/dL   Comment 1 Notify RN    Comment 2 Document in Chart     Glucose, capillary     Status: Abnormal   Collection Time: 07/14/18  3:44 PM  Result Value Ref Range   Glucose-Capillary 268 (H) 70 - 99 mg/dL   Comment 1 Notify RN    Comment 2 Document in Chart   Glucose, capillary     Status: Abnormal   Collection Time: 07/14/18  9:23 PM  Result Value Ref Range   Glucose-Capillary 114 (H) 70 - 99 mg/dL   Comment 1 Notify RN   Glucose, capillary     Status: Abnormal   Collection Time: 07/15/18  8:49 AM  Result Value Ref Range   Glucose-Capillary 208 (H) 70 - 99 mg/dL  Glucose, capillary     Status: Abnormal   Collection Time: 07/15/18 11:21 AM  Result Value Ref Range   Glucose-Capillary 194 (H) 70 - 99 mg/dL  Glucose, capillary  Status: Abnormal   Collection Time: 07/15/18  4:14 PM  Result Value Ref Range   Glucose-Capillary 273 (H) 70 - 99 mg/dL  Glucose, capillary     Status: None   Collection Time: 07/15/18  9:41 PM  Result Value Ref Range   Glucose-Capillary 84 70 - 99 mg/dL   No results found.     Medical Problem List and Plan: 1.  Decreased functional mobility lower extremity weakness secondary to MSSA lumbar spine discitis/osteomyelitis epidural abscess in the setting of bacteremia as well as right foot ulceration complicated by cardiac arrest.S/Pdecompressive lumbar laminectomy L-1-2 with hemi-facetectomy, posterior fixation T10-L3 07/11/2018 per Dr. Sherley Bounds. Back brace when out of bed applied in sitting position.  Admit to CIR 2.  Antithrombotics: -DVT/anticoagulation:  SCDs.  Initial vascular study to 12/08/2018 negative  -antiplatelet therapy: none 3. Pain Management: MS Contin 15 mg every 12 hours, Robaxin and hydrocodone as needed.Monitor mental status secondary to narcotics 4. Mood: Provide emotional support  -antipsychotic agents: none 5. Neuropsych: This patient is capable of making decisions on her own behalf. 6. Skin/Wound Care:  Foam dressing to right foot change every 3 days as needed..Routine skin checks 7.  Fluids/Electrolytes/Nutrition:  Routine in and out's with follow-up chemistries in a.m. 8. ID/MSSA bacteremia/epidural abscess.IV Ancef 2 g every 8 hours,rifampin 300 mg every 12 hours initiated 07/11/2018 8 weeks. Follow-up infectious disease 9. Acute on chronic anemia. Patient has been transfused 2 units pack red blood cells 07/12/2018 Follow-up CBC in a.m. Continue iron supplement 10. Essential hypertension. Lopressor 25 mg twice a day,Lasix 40 mg daily, lisinopril 10 mg daily. Monitor with increased mobility 11. Diabetes mellitus with peripheral neuropathy. Hemoglobin A1c 8.7. Lantus insulin 20 units daily. Check blood sugars before meals and at bedtime.  Monitor with increased mobility 12. Urinary retention. Urecholine 10 mg 3 times a day. Check PVR 3 13. B12 deficiency. Continue vitamin B-12  Post Admission Physician Evaluation: 1. Preadmission assessment reviewed and changes made below. 2. Functional deficits secondary  to lumbar spine discitis/osteomyelitis with epidural abscess complicated by cardiac arrest status post decompressive lumbar laminectomy and posterior fixation. 3. Patient is admitted to receive collaborative, interdisciplinary care between the physiatrist, rehab nursing staff, and therapy team. 4. Patient's level of medical complexity and substantial therapy needs in context of that medical necessity cannot be provided at a lesser intensity of care such as a SNF. 5. Patient has experienced substantial functional loss from his/her baseline which was documented above under the "Functional History" and "Functional Status" headings.  Judging by the patient's diagnosis, physical exam, and functional history, the patient has potential for functional progress which will result in measurable gains while on inpatient rehab.  These gains will be of substantial and practical use upon discharge  in facilitating mobility and self-care at the household level. 6. Physiatrist will provide 24  hour management of medical needs as well as oversight of the therapy plan/treatment and provide guidance as appropriate regarding the interaction of the two. 7. 24 hour rehab nursing will assist with bladder management, bowel management, safety, skin/wound care, disease management, medication administration, pain management and patient education  and help integrate therapy concepts, techniques,education, etc. 8. PT will assess and treat for/with: Lower extremity strength, range of motion, stamina, balance, functional mobility, safety, adaptive techniques and equipment, wound care, coping skills, pain control, education. Goals are: Supervision/min A. 9. OT will assess and treat for/with: ADL's, functional mobility, safety, upper extremity strength, adaptive techniques and equipment, wound mgt, ego support, and  community reintegration.   Goals are: Min A. Therapy may not proceed with showering this patient. 10. Case Management and Social Worker will assess and treat for psychological issues and discharge planning. 11. Team conference will be held weekly to assess progress toward goals and to determine barriers to discharge. 12. Patient will receive at least 3 hours of therapy per day at least 5 days per week. 13. ELOS: 12-16 days.       14. Prognosis:  good  I have personally performed a face to face diagnostic evaluation, including, but not limited to relevant history and physical exam findings, of this patient and developed relevant assessment and plan.  Additionally, I have reviewed and concur with the physician assistant's documentation above.   Delice Lesch, MD, ABPMR Lavon Paganini Angiulli, PA-C 07/16/2018

## 2018-07-16 NOTE — Consult Note (Addendum)
[address]       [place] [address]             [phone]        Yzabelle Deveney [place] Medical Record #[id number] Date of Birth: 04-16-1951  Referring: Janene Madeira NP-C, Dr. Carlyle Basques MD Primary Care: [name], MD   Chief Complaint: Aortic valve endocarditis, MSSA bactermia   History of Present Illness:     This is a 68 year old female with a past medical history of type 2 diabetes mellitus, DKA, and diabetic foot ulcer, essential hypertension, and hyperlipidemia who according to medical records, initially presented to [facility] at the end of January with complaints of cough. She was treated for Adenovirus. She presented to [facility] ED again with complaints of back pain and difficulty walking. 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 She  was treated with Ancef. She was then transferred to [facility] for further evaluation and treatment. IR was consulted for image guided aspiration/possible drainage of the L1-2 disc space/surrounding soft tissues. After the procedure, she became apneic, bradycardic, then had no pulse (asystole vs PEA). She did not require defibrillation and had ROSC quickly. Blood cultures showed MSSA and she was put on Cefazolin. Per infectious disease, she would need this for 6-8 weeks.  Echo done 01/29 showed no evidence of endocarditis. She was discharged to CIR on 02/06.  Around 02/19 while still in CIR, she had worsening of her back pain. 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 Neurosurgery and infectious disease consults were obtained. After much discussion, neurosurgery decided to explore  the abscess as it is not responding to antibiotics and requested transfer to medical floor. Ultimately, patient underwent decompressive lumbar laminectomy lumbar 1-2 left with hemi-facetectomy and sublaminar decompression and discectomy, posterior fixation T10-L3 inclusive using Alphatec cortical pedicle screws, and intertransverse arthrodesis T10-L3 by Dr. Ronnald Ramp on 02/21.  She continued to be treated for MSSA bacteremia. Oral Rifampin was added to Cefazolin. TEE was obtained on 02/25 and there was no valvular structure abnormalities;however, there was small mobile aortic vegetation seen.   Current Activity/ Functional Status: Patient was independent with mobility/ambulation, transfers, ADL's, IADL's. She is still undergoing PT/OT therapy in hospital.   Zubrod Score: At the time of surgery this patient's most appropriate activity status/level should be described as: '[]'     0    Normal activity, no symptoms '[]'     1    Restricted in physical strenuous activity but ambulatory, able to do out light work '[]'     2    Ambulatory and capable of self care, unable to do work activities, up and about   more than 50%  Of the time                            '[x]'     3    Only limited self care, in bed greater than 50% of waking hours '[]'     4    Completely disabled, no self care, confined to bed or chair '[]'     5    Moribund  Past Medical History:  Diagnosis Date  . Anemia   . Diabetes mellitus  without complication ([place])   . Hyperlipidemia   . Hypertension   . Peripheral neuropathy     Past Surgical History:  Procedure Laterality Date  . ABDOMINAL HYSTERECTOMY    . IR LUMBAR DISC ASPIRATION W/IMG GUIDE  06/18/2018    Social History   Tobacco Use  Smoking Status Never Smoker  Smokeless Tobacco Never Used    Social History   Substance and Sexual Activity  Alcohol Use Never  . Frequency: Never     Allergies  Allergen Reactions  . Sulfa Antibiotics Rash    Current Facility-Administered  Medications  Medication Dose Route Frequency Provider Last Rate Last Dose  . 0.9 %  sodium chloride infusion  250 mL Intravenous Continuous Lelon Perla, MD      . 0.9 % NaCl with KCl 20 mEq/ L  infusion   Intravenous Continuous Lelon Perla, MD 50 mL/hr at 07/15/18 2134    . acetaminophen (TYLENOL) tablet 650 mg  650 mg Oral Q4H PRN Lelon Perla, MD   650 mg at 07/12/18 1428   Or  . acetaminophen (TYLENOL) suppository 650 mg  650 mg Rectal Q4H PRN Lelon Perla, MD      . albuterol (PROVENTIL) (2.5 MG/3ML) 0.083% nebulizer solution 2.5 mg  2.5 mg Nebulization Q2H PRN Lelon Perla, MD      . aspirin chewable tablet 81 mg  81 mg Oral q morning - 10a Lelon Perla, MD   81 mg at 07/15/18 1112  . bethanechol (URECHOLINE) tablet 10 mg  10 mg Oral TID Lelon Perla, MD   10 mg at 07/15/18 2136  . bisacodyl (DULCOLAX) suppository 10 mg  10 mg Rectal Daily PRN Lelon Perla, MD      . ceFAZolin (ANCEF) IVPB 2g/100 mL premix  2 g Intravenous Q8H Lelon Perla, MD 200 mL/hr at 07/16/18 0616 2 g at 07/16/18 0616  . feeding supplement (GLUCERNA SHAKE) (GLUCERNA SHAKE) liquid 237 mL  237 mL Oral TID WC Lelon Perla, MD   237 mL at 07/15/18 2142  . feeding supplement (PRO-STAT SUGAR FREE 64) liquid 30 mL  30 mL Oral BID Lelon Perla, MD   30 mL at 07/15/18 2136  . ferrous sulfate tablet 325 mg  325 mg Oral Q breakfast Lelon Perla, MD   325 mg at 07/16/18 0973  . furosemide (LASIX) tablet 40 mg  40 mg Oral Daily Lelon Perla, MD   40 mg at 07/15/18 1113  . HYDROcodone-acetaminophen (NORCO) 7.5-325 MG per tablet 1 tablet  1 tablet Oral Q6H PRN Lelon Perla, MD   1 tablet at 07/15/18 2135  . insulin aspart (novoLOG) injection 0-15 Units  0-15 Units Subcutaneous TID WC Lelon Perla, MD   8 Units at 07/15/18 1707  . insulin glargine (LANTUS) injection 20 Units  20 Units Subcutaneous Daily Lelon Perla, MD   20 Units at 07/15/18 1404  .  ketoconazole (NIZORAL) 2 % cream   Topical BID Lelon Perla, MD      . lisinopril (PRINIVIL,ZESTRIL) tablet 10 mg  10 mg Oral Daily Lelon Perla, MD   10 mg at 07/15/18 1114  . menthol-cetylpyridinium (CEPACOL) lozenge 3 mg  1 lozenge Oral PRN Lelon Perla, MD       Or  . phenol (CHLORASEPTIC) mouth spray 1 spray  1 spray Mouth/Throat PRN Lelon Perla, MD      . methocarbamol (ROBAXIN) tablet 500  mg  500 mg Oral Q6H PRN Lelon Perla, MD   500 mg at 07/15/18 2136   Or  . methocarbamol (ROBAXIN) 500 mg in dextrose 5 % 50 mL IVPB  500 mg Intravenous Q6H PRN Lelon Perla, MD      . metoprolol tartrate (LOPRESSOR) tablet 25 mg  25 mg Oral BID Lelon Perla, MD   25 mg at 07/15/18 2135  . morphine (MS CONTIN) 12 hr tablet 15 mg  15 mg Oral Q12H Lelon Perla, MD   15 mg at 07/15/18 2136  . multivitamin with minerals tablet 1 tablet  1 tablet Oral Daily Lelon Perla, MD   1 tablet at 07/15/18 1112  . ondansetron (ZOFRAN) tablet 4 mg  4 mg Oral Q6H PRN Lelon Perla, MD       Or  . ondansetron [facility]) injection 4 mg  4 mg Intravenous Q6H PRN Lelon Perla, MD      . ondansetron (ZOFRAN-ODT) disintegrating tablet 4 mg  4 mg Oral BID Lelon Perla, MD   4 mg at 07/16/18 [phone]  . pantoprazole (PROTONIX) EC tablet 40 mg  40 mg Oral Daily Lelon Perla, MD   40 mg at 07/15/18 1115  . polyethylene glycol (MIRALAX / GLYCOLAX) packet 17 g  17 g Oral Daily Lelon Perla, MD   17 g at 07/15/18 1117  . rifampin (RIFADIN) capsule 300 mg  300 mg Oral Q12H Lelon Perla, MD   300 mg at 07/16/18 3832  . senna (SENOKOT) tablet 8.6 mg  1 tablet Oral BID Lelon Perla, MD   8.6 mg at 07/15/18 2136  . sodium chloride flush (NS) 0.9 % injection 10-40 mL  10-40 mL Intracatheter Q12H Lelon Perla, MD   10 mL at 07/15/18 2136  . sodium chloride flush (NS) 0.9 % injection 10-40 mL  10-40 mL Intracatheter PRN Lelon Perla, MD      . sodium chloride  flush (NS) 0.9 % injection 3 mL  3 mL Intravenous Q12H Lelon Perla, MD   3 mL at 07/15/18 2137  . sodium chloride flush (NS) 0.9 % injection 3 mL  3 mL Intravenous PRN Lelon Perla, MD      . sodium phosphate (FLEET) 7-19 GM/118ML enema 1 enema  1 enema Rectal Daily PRN Lelon Perla, MD      . sorbitol 70 % solution 30 mL  30 mL Oral Daily PRN Lelon Perla, MD      . vitamin B-12 (CYANOCOBALAMIN) tablet 1,000 mcg  1,000 mcg Oral Daily Lelon Perla, MD   1,000 mcg at 07/15/18 1113    Medications Prior to Admission  Medication Sig Dispense Refill Last Dose  . albuterol (PROVENTIL) (2.5 MG/3ML) 0.083% nebulizer solution Take 3 mLs (2.5 mg total) by nebulization every 2 (two) hours as needed for wheezing or shortness of breath. 75 mL 12 07/09/2018  . aspirin 81 MG chewable tablet Chew 81 mg by mouth every morning.    06/15/2018 at Unknown time  . bethanechol (URECHOLINE) 10 MG tablet Take 1 tablet (10 mg total) by mouth 3 (three) times daily.     . bisacodyl (DULCOLAX) 10 MG suppository Place 1 suppository (10 mg total) rectally daily as needed for moderate constipation. 12 suppository 0   . ceFAZolin (ANCEF) IVPB Inject 2 g into the vein every 8 (eight) hours. Indication: MSSA discitis/osteomyelitis Last Day of Therapy: 08/13/2018 Labs - Once  weekly:  CBC/D and BMP, Labs - Every other week:  ESR and CRP 162 Units 0   . furosemide (LASIX) 20 MG tablet Take 2 tablets (40 mg total) by mouth daily.     . heparin 5000 UNIT/ML injection Inject 1 mL (5,000 Units total) into the skin every 8 (eight) hours. 1 mL    . HYDROcodone-acetaminophen (NORCO) 10-325 MG tablet Take 1 tablet by mouth every 4 (four) hours as needed for moderate pain. 30 tablet 0   . insulin aspart (NOVOLOG) 100 UNIT/ML injection Inject 0-9 Units into the skin 3 (three) times daily with meals. 10 mL 11   . insulin glargine (LANTUS) 100 UNIT/ML injection Inject 0.2 mLs (20 Units total) into the skin daily. 10 mL 11    . ketoconazole (NIZORAL) 2 % cream Apply topically 2 (two) times daily. 15 g 0   . methocarbamol (ROBAXIN) 750 MG tablet Take 1 tablet (750 mg total) by mouth every 6 (six) hours.     . metoprolol tartrate (LOPRESSOR) 25 MG tablet Take 1 tablet (25 mg total) by mouth 2 (two) times daily.     Marland Kitchen morphine (MS CONTIN) 30 MG 12 hr tablet Take 1 tablet (30 mg total) by mouth every 12 (twelve) hours.  0   . Multiple Vitamin (THERA) TABS Take 1 tablet by mouth daily.    06/15/2018 at Unknown time  . pantoprazole (PROTONIX) 40 MG tablet Take 1 tablet (40 mg total) by mouth daily. 30 tablet 1 06/15/2018 at Unknown time  . senna-docusate (SENOKOT-S) 8.6-50 MG tablet Take 2 tablets by mouth at bedtime.     . vitamin B-12 (CYANOCOBALAMIN) 1000 MCG tablet Take 1 tablet (1,000 mcg total) by mouth daily for 30 days. 30 tablet 0     Family History  Problem Relation Age of Onset  . Cancer Mother   . Stroke Mother   . Cancer Father   . Leukemia Father    Review of Systems:      Cardiac Review of Systems: Y or  [ N   ]= no  Chest Pain [ N   ]  SOB [ N  ]   Syncope  [ N ]   Presyncope Aqua.Slicker   ]  General Review of Systems: [Y] = yes [N  ]=no Constitional:  fatigue [ Y ]; nausea [ N ];  fever [ N ];                                                         Eye : blurred vision Aqua.Slicker  ];  Amaurosis fugax[ N ]; Resp: cough [ N ];  wheezing[ N ];  hemoptysis[N  ];  GI:   vomiting[ N ];  dysphagia[  ]; melena[ N ];  hematochezia Aqua.Slicker  ];  GU:  hematuria[N  ];                Skin: right heel ulcer [Y  ];, Musculosketetal:  back pain[ Y ];  Heme/Lymph:   anemia[Y  ];  Neuro: TIA Aqua.Slicker  ];   stroke[ N ];     Endocrine: diabetes[Y  ];               Physical Exam: BP 127/62   Pulse 99   Temp 98.6 F (37 C) (Oral)  Resp 20   Ht '4\' 10"'  (1.473 m)   Wt 72.6 kg   SpO2 97%   BMI 33.44 kg/m    General appearance: alert, cooperative and no distress Head: Normocephalic, without obvious abnormality, atraumatic Resp:  clear to auscultation bilaterally Cardio: RRR, no murmur Extremities: Ted hose in place. Diabetic ulcer right heel (dressing intact). Neurologic: Able to move all extremities. She has some back pain but less so than prior to surgery  Diagnostic Studies & Laboratory data:     Recent Radiology Findings:   No results found.   I have independently reviewed the above radiologic studies and discussed with the patient   Recent Lab Findings: Lab Results  Component Value Date   WBC 6.3 07/14/2018   HGB 9.9 (L) 07/14/2018   HCT 30.9 (L) 07/14/2018   PLT 304 07/14/2018   GLUCOSE 199 (H) 07/14/2018   ALT <5 07/08/2018   AST 14 (L) 07/08/2018   NA 131 (L) 07/14/2018   K 3.9 07/14/2018   CL 100 07/14/2018   CREATININE 0.45 07/14/2018   BUN 9 07/14/2018   CO2 25 07/14/2018   INR 1.04 06/17/2018   HGBA1C 8.7 (H) 06/20/2018    Assessment / Plan:   1. 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 Continue antibiotics. No indication for AVR. Can be followed by cardiology for signs of CHF and serial echocardiogram   Generally accepted indications for surgical treatment of endocarditis:  Valve abnormalities or regurgitation resulting in congestive heart failure Microorganisms that are not controlled by antimicrobial therapy (fungal) Endocarditis leading to valve dehiscence, perforation, rupture or fistula or large perivalvular abscess, recurrent emboli Persistent vegetation or fever/bacteremia despite optimal treatment vegetations  that are mobile and larger then>10 mm in diameter on the mitral valve vegetations that are increasing in size despite antimicrobial therapy Mitral "kissing" vegetation  2. ID-MSSA bactermia. She is on IV Cefazolin and oral Rifampin (needs 8 weeks per infectious disease)  3. Type 2 diabetes mellitus, diabetic foot ulcer -on Insulin. HGA1C 8.7 January of this year. Management per medicine.  4. Essential  hypertension-on Metoprolol 25 mg bid and Lisinopril 10 mg daily. Management per medicine.  5. Anemia-on ferrous sulfate. Management per medicine  I  spent 15 minutes counseling the patient face to face and 45 minutes overall.   Lars Pinks PA-C 07/16/2018 7:39 AM  Patients history and echocardiogram reviewed, no indication for cardiac surgery, will need to be followed by cardiology for chf and serial echocardiograms   Grace Isaac MD Beeper [phone] Office [phone] 07/16/2018 8:18 PM

## 2018-07-16 NOTE — Progress Notes (Signed)
Patient ID: Kaitlin Vargas, female   DOB: [birth year], 68 y.o.   MRN: [id number] Patient arrived to [phone] from 4NP14 with 2RN's via wheelchair. Re-oriented to unit processes, fall prevention plan, safety plan, scheduling process, and nurse call system. Patient resting in bed with bed alarm on and nurse call bell at patient side.

## 2018-07-16 NOTE — Progress Notes (Signed)
Subjective: Patient reports back pain 510, doing much better  Objective: Vital signs in last 24 hours: Temp:  [97.9 F (36.6 C)-98.8 F (37.1 C)] 98.6 F (37 C) (02/26 0300) Pulse Rate:  [96-114] 114 (02/26 1000) Resp:  [15-22] 15 (02/26 1000) BP: (149)/(86) 149/86 (02/26 0800) SpO2:  [98 %-100 %] 99 % (02/26 1000)  Intake/Output from previous day: 02/25 0701 - 02/26 0700 In: -  Out: 800 [Urine:800] Intake/Output this shift: Total I/O In: 1718.3 [P.O.:120; I.V.:1121.9; IV Piggyback:476.5] Out: 1125 [Urine:1125]  Neurologic: Grossly normal  Lab Results: Lab Results  Component Value Date   WBC 6.3 07/14/2018   HGB 9.9 (L) 07/14/2018   HCT 30.9 (L) 07/14/2018   MCV 97.2 07/14/2018   PLT 304 07/14/2018   Lab Results  Component Value Date   INR 1.04 06/17/2018   BMET Lab Results  Component Value Date   NA 131 (L) 07/14/2018   K 3.9 07/14/2018   CL 100 07/14/2018   CO2 25 07/14/2018   GLUCOSE 199 (H) 07/14/2018   BUN 9 07/14/2018   CREATININE 0.45 07/14/2018   CALCIUM 8.1 (L) 07/14/2018    Studies/Results: No results found.  Assessment/Plan: Continue therapies, no new nsgy recom   LOS: 6 days    Kaitlin Vargas 07/16/2018, 11:59 AM

## 2018-07-17 ENCOUNTER — Inpatient Hospital Stay: Payer: Self-pay

## 2018-07-17 ENCOUNTER — Inpatient Hospital Stay (HOSPITAL_COMMUNITY): Payer: Medicare HMO

## 2018-07-17 ENCOUNTER — Encounter (HOSPITAL_COMMUNITY): Payer: Self-pay | Admitting: Neurological Surgery

## 2018-07-17 ENCOUNTER — Inpatient Hospital Stay (HOSPITAL_COMMUNITY): Payer: Medicare HMO | Admitting: Occupational Therapy

## 2018-07-17 DIAGNOSIS — E871 Hypo-osmolality and hyponatremia: Secondary | ICD-10-CM

## 2018-07-17 DIAGNOSIS — D649 Anemia, unspecified: Secondary | ICD-10-CM

## 2018-07-17 DIAGNOSIS — E669 Obesity, unspecified: Secondary | ICD-10-CM

## 2018-07-17 DIAGNOSIS — R339 Retention of urine, unspecified: Secondary | ICD-10-CM

## 2018-07-17 DIAGNOSIS — E46 Unspecified protein-calorie malnutrition: Secondary | ICD-10-CM

## 2018-07-17 DIAGNOSIS — E1142 Type 2 diabetes mellitus with diabetic polyneuropathy: Secondary | ICD-10-CM

## 2018-07-17 DIAGNOSIS — R7881 Bacteremia: Secondary | ICD-10-CM

## 2018-07-17 DIAGNOSIS — K5903 Drug induced constipation: Secondary | ICD-10-CM

## 2018-07-17 DIAGNOSIS — I10 Essential (primary) hypertension: Secondary | ICD-10-CM

## 2018-07-17 DIAGNOSIS — E1169 Type 2 diabetes mellitus with other specified complication: Secondary | ICD-10-CM

## 2018-07-17 DIAGNOSIS — Z981 Arthrodesis status: Secondary | ICD-10-CM

## 2018-07-17 LAB — GLUCOSE, CAPILLARY
Glucose-Capillary: 127 mg/dL — ABNORMAL HIGH (ref 70–99)
Glucose-Capillary: 206 mg/dL — ABNORMAL HIGH (ref 70–99)
Glucose-Capillary: 90 mg/dL (ref 70–99)
Glucose-Capillary: 199 mg/dL — ABNORMAL HIGH (ref 70–99)

## 2018-07-17 LAB — CBC WITH DIFFERENTIAL/PLATELET
ABS IMMATURE GRANULOCYTES: 0.06 10*3/uL (ref 0.00–0.07)
Basophils Absolute: 0 10*3/uL (ref 0.0–0.1)
Basophils Relative: 1 %
Eosinophils Absolute: 0.1 10*3/uL (ref 0.0–0.5)
Eosinophils Relative: 2 %
HCT: 31.2 % — ABNORMAL LOW (ref 36.0–46.0)
Hemoglobin: 9.6 g/dL — ABNORMAL LOW (ref 12.0–15.0)
Immature Granulocytes: 1 %
Lymphocytes Relative: 26 %
Lymphs Abs: 1.6 10*3/uL (ref 0.7–4.0)
MCH: 30.4 pg (ref 26.0–34.0)
MCHC: 30.8 g/dL (ref 30.0–36.0)
MCV: 98.7 fL (ref 80.0–100.0)
MONOS PCT: 13 %
Monocytes Absolute: 0.8 10*3/uL (ref 0.1–1.0)
NRBC: 0 % (ref 0.0–0.2)
Neutro Abs: 3.6 10*3/uL (ref 1.7–7.7)
Neutrophils Relative %: 57 %
Platelets: 322 10*3/uL (ref 150–400)
RBC: 3.16 MIL/uL — ABNORMAL LOW (ref 3.87–5.11)
RDW: 17.2 % — ABNORMAL HIGH (ref 11.5–15.5)
WBC: 6.3 10*3/uL (ref 4.0–10.5)

## 2018-07-17 LAB — COMPREHENSIVE METABOLIC PANEL
ALT: 6 U/L (ref 0–44)
AST: 12 U/L — ABNORMAL LOW (ref 15–41)
Albumin: 2 g/dL — ABNORMAL LOW (ref 3.5–5.0)
Alkaline Phosphatase: 103 U/L (ref 38–126)
Anion gap: 7 (ref 5–15)
BUN: 17 mg/dL (ref 8–23)
CO2: 29 mmol/L (ref 22–32)
CREATININE: 0.57 mg/dL (ref 0.44–1.00)
Calcium: 8.4 mg/dL — ABNORMAL LOW (ref 8.9–10.3)
Chloride: 93 mmol/L — ABNORMAL LOW (ref 98–111)
GFR calc Af Amer: >60 mL/min (ref >60)
GFR calc non Af Amer: >60 mL/min (ref >60)
Glucose, Bld: 200 mg/dL — ABNORMAL HIGH (ref 70–99)
Potassium: 4.1 mmol/L (ref 3.5–5.1)
Sodium: 129 mmol/L — ABNORMAL LOW (ref 135–145)
Total Bilirubin: 0.8 mg/dL (ref 0.3–1.2)
Total Protein: 5.8 g/dL — ABNORMAL LOW (ref 6.5–8.1)

## 2018-07-17 MED ORDER — ALTEPLASE 2 MG IJ SOLR
2.0000 mg | Freq: Once | INTRAMUSCULAR | Status: AC
  Administered 2018-07-17: 2 mg
  Filled 2018-07-17: qty 2

## 2018-07-17 MED ORDER — SENNOSIDES-DOCUSATE SODIUM 8.6-50 MG PO TABS
2.0000 | ORAL_TABLET | Freq: Two times a day (BID) | ORAL | Status: DC
  Administered 2018-07-17 – 2018-07-23 (×13): 2 via ORAL
  Filled 2018-07-17 (×14): qty 2

## 2018-07-17 NOTE — Evaluation (Addendum)
Physical Therapy Assessment and Plan  Patient Details  Name: Kaitlin Vargas MRN: [id number] Date of Birth: 26-Feb-1951  PT Diagnosis: Abnormality of gait, Contracture of joint: knee L, Difficulty walking, Edema, Impaired sensation, Low back pain and Muscle weakness Rehab Potential: Good ELOS: 14   Today's Date: 07/17/2018 PT Individual [id number]  - 1015, 60 min      Problem List:  Patient Active Problem List   Diagnosis Date Noted  . Drug induced constipation   . Hypoalbuminemia due to protein-calorie malnutrition ([place])   . Diabetes mellitus type 2 in obese ([place])   . Vitamin B12 deficiency   . Urinary retention   . Diabetic peripheral neuropathy ([place])   . Postoperative pain   . 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 S/P lumbar fusion 07/11/2018  . Epidural abscess   . Sinus tachycardia   . Hyponatremia   . Labile blood glucose   . Acute on chronic anemia   . Bacteremia   . Lumbar discitis 06/26/2018  . Cardiac arrest, cause unspecified ([place])   . Acute encephalopathy   . MSSA bacteremia 06/17/2018  . 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 DKA (diabetic ketoacidosis) ([place]) 06/16/2018  . Diabetic foot ulcer ([place]) 06/16/2018  . Acute hypoxemic respiratory failure ([place]) 06/07/2018  . Essential hypertension 06/07/2018  . Type 2 diabetes mellitus with hyperlipidemia ([place]) 06/07/2018  . Anemia 06/07/2018  . Acute bronchitis 06/07/2018    Past Medical History:  Past Medical History:  Diagnosis Date  . Anemia   . Diabetes mellitus without complication ([place])   . Hyperlipidemia   . Hypertension   . Peripheral neuropathy    Past Surgical History:  Past Surgical History:  Procedure Laterality Date  . ABDOMINAL HYSTERECTOMY    . IR LUMBAR DISC ASPIRATION W/IMG GUIDE  06/18/2018  . TEE WITHOUT CARDIOVERSION N/A 07/15/2018   Procedure: TRANSESOPHAGEAL ECHOCARDIOGRAM (TEE);  Surgeon: Lelon Perla, MD;  Location: [facility] ENDOSCOPY;  Service: Cardiovascular;  Laterality: N/A;     Assessment & Plan Clinical Impression:   Patient transferred to CIR on 07/16/2018 .   Patient currently requires max with mobility secondary to muscle weakness and muscle joint tightness, decreased cardiorespiratoy endurance, unbalanced muscle activation and decreased sitting balance, decreased standing balance, decreased balance strategies and difficulty maintaining precautions.  Prior to hospitalization, patient was modified independent  with mobility and lived with Spouse, Daughter in a House home, PTA (1st one)_.  Home access is  Ramped entrance.  Patient will benefit from skilled PT intervention to maximize safe functional mobility, minimize fall risk and decrease caregiver burden for planned discharge home with 24 hour supervision.  Anticipate patient will benefit from follow up [place] at discharge.  PT - End of Session Activity Tolerance: Tolerates 10 - 20 min activity with multiple rests Endurance Deficit: Yes Endurance Deficit Description: fatigued after gait x 60' PT Assessment Rehab Potential (ACUTE/IP ONLY): Good PT Patient demonstrates impairments in the following area(s): Balance;Edema;Endurance;Sensory;Motor PT Transfers Functional Problem(s): Bed Mobility;Bed to Chair;Car;Furniture PT Locomotion Functional Problem(s): Wheelchair Mobility;Ambulation PT Plan PT Intensity: Minimum of 1-2 x/day ,45 to 90 minutes PT Frequency: 5 out of 7 days PT Duration Estimated Length of Stay: 14 PT Treatment/Interventions: Ambulation/gait training;Community reintegration;DME/adaptive equipment instruction;Neuromuscular re-education;Stair training;UE/LE Strength taining/ROM;Wheelchair propulsion/positioning;Balance/vestibular training;Discharge planning;Pain management;Therapeutic Activities;UE/LE Coordination activities;Functional mobility training;Patient/family education;Splinting/orthotics;Therapeutic Exercise PT Transfers Anticipated Outcome(s): supervision PT Locomotion Anticipated  Outcome(s): supervision PT Recommendation Follow Up Recommendations: Home health PT Patient destination: Home Equipment Recommended: To be determined  Skilled Therapeutic Intervention  Pt sitting up in w/c.  TLSO already donned.  PT and pt adjusted TLSO to better fit her with limited success due to her body habitus.  Pt able to state 2/3 back precautions, and needed VCS to avoid rotation when manipulating components of TLSO.  Mobility and locomotion assessed with pt using RW, as per PLOF. After sitting in w/c for some time, initial sit> stand required max assist; later sit> stands required mod assist.   ELOS and functional status at DC explained to pt.  Bed mobility not assessed due to time constraints.  Pt reported that she needed to use toilet.  Stand pivot with min assist, mod cues for technique.   Pt left sitting on toilet with LeeAnn, NT in attendance.    PT Evaluation Precautions/Restrictions Precautions Precautions: Fall;Back Precaution Comments: reviewed with patient, cueing to adhere to during tasks  Required Braces or Orthoses: Spinal Brace Spinal Brace: Applied in sitting position Restrictions Weight Bearing Restrictions: No  Pain 5/10 surgical back; premedicated   Home Living/Prior Functioning Home Living Available Help at Discharge: Family;Available 24 hours/day Type of Home: House Home Access: Ramped entrance Home Layout: One level Bathroom Shower/Tub: Chiropodist: Standard Bathroom Accessibility: Yes Additional Comments: Ambulated with cane and RW PTA  Lives With: Spouse;Daughter Prior Function Level of Independence: Independent with basic ADLs;Independent with homemaking with ambulation  Able to Take Stairs?: No Driving: No Vocation: Retired Vision/Perception - not changed; wears reading glasses    Cognition Overall Cognitive Status: Within Functional Limits for tasks assessed Arousal/Alertness: Awake/alert Orientation Level: Oriented  X4 Memory: Impaired(remembered 2/3 words after 10 min) Safety/Judgment: Appears intact Sensation Sensation Light Touch: Impaired Detail Peripheral sensation comments: peripheral neuropathy bil feet; LT absent L toes, impaired R toes Proprioception: Impaired Detail Proprioception Impaired Details: Impaired RLE;Impaired LLE(bil great toes) Coordination Gross Motor Movements are Fluid and Coordinated: Yes Fine Motor Movements are Fluid and Coordinated: Not tested Heel Shin Test: NT Motor  Motor Motor - Skilled Clinical Observations: generalized weakness  Mobility Bed Mobility Bed Mobility: Not assessed Transfers Sit to Stand: Maximal Assistance - Patient 25-49% Stand to Sit: Minimal Assistance - Patient > 75% Transfer (Assistive device): Rolling walker Locomotion  Gait Ambulation: Yes Gait Assistance: Minimal Assistance - Patient > 75% Gait Distance (Feet): 60 Feet Assistive device: Rolling walker Gait Gait: Yes Gait Pattern: Step-to pattern;Decreased dorsiflexion - right;Decreased dorsiflexion - left;Decreased hip/knee flexion - left;Decreased hip/knee flexion - right;Decreased trunk rotation;Trunk flexed Stairs / Additional Locomotion Stairs: No Wheelchair Mobility Wheelchair Mobility: Yes Wheelchair Assistance: Chartered loss adjuster: Both upper extremities Wheelchair Parts Management: Needs assistance Distance: 150  Trunk/Postural Assessment  Cervical Assessment Cervical Assessment: (fwd head) Thoracic Assessment Thoracic Assessment: (mild kyphosis) Lumbar Assessment Lumbar Assessment: (posterior pelvic tilt)  Balance Balance Balance Assessed: Yes Static Sitting Balance Static Sitting - Level of Assistance: 5: Stand by assistance Dynamic Sitting Balance Dynamic Sitting - Level of Assistance: 5: Stand by assistance Sitting balance - Comments: cues for back precautions  Static Standing Balance Static Standing - Balance Support: During  functional activity;Bilateral upper extremity supported Static Standing - Level of Assistance: 3: Mod assist Dynamic Standing Balance Dynamic Standing - Balance Support: Bilateral upper extremity supported Dynamic Standing - Level of Assistance: 4: Min assist(during gait with RW) Extremity Assessment      RLE Assessment RLE Assessment: Exceptions to WFL(mild non pitting edema R LLE and foot) Passive Range of Motion (PROM) Comments: - 5 degrees ankle DF; tight hamstrings, no measured General Strength Comments: grossly 3-/5  hip flexion, 4/5 knee extension, 3-/5 ankle DF LLE Assessment LLE Assessment: Exceptions to WFL(moderate edema knee; pt states OA PTA) Passive Range of Motion (PROM) Comments: 5 degrees ankole DF; - 39 degrees knee extension due to swelling and tight hamstrings General Strength Comments: grossly 3-/5 hip flexion, 3-/5 knee extension but able to take resistance, 4-/5 ankle DF    Refer to Care Plan for Long Term Goals  Recommendations for other services: None   Discharge Criteria: Patient will be discharged from PT if patient refuses treatment 3 consecutive times without medical reason, if treatment goals not met, if there is a change in medical status, if patient makes no progress towards goals or if patient is discharged from hospital.  The above assessment, treatment plan, treatment alternatives and goals were discussed and mutually agreed upon: by patient  Rita Prom 07/17/2018, 6:03 PM

## 2018-07-17 NOTE — Progress Notes (Signed)
Per nursing, patient was given "Data Collection Information Summary for Patients in Inpatient Rehabilitation Facilities with attached Privacy Act Statement Health Care Records" upon admission.    Patient information reviewed and entered into e[facility] System by Becky Arlon Bleier, PPS coordinator. Information including medical coding, function ability, and quality indicators will be reviewed and updated through discharge.   

## 2018-07-17 NOTE — Progress Notes (Signed)
[facility] PHYSICAL MEDICINE & REHABILITATION PROGRESS NOTE  Subjective/Complaints: Patient seen sitting up in bed this morning.  She states she did not sleep well overnight because she felt like she had to have a bowel movement and she feels like she has a bladder infection.  ROS: Denies CP, shortness of breath, nausea, vomiting, diarrhea.  Objective: Vital Signs: Blood pressure 124/83, pulse 99, temperature 98 F (36.7 C), resp. rate 16, height 4\' 11"  (1.499 m), weight 73.2 kg, SpO2 96 %. No results found. Recent Labs    07/17/18 0100  WBC 6.3  HGB 9.6*  HCT 31.2*  PLT 322   Recent Labs    07/17/18 0100  NA 129*  K 4.1  CL 93*  CO2 29  GLUCOSE 200*  BUN 17  CREATININE 0.57  CALCIUM 8.4*    Physical Exam: BP 124/83 (BP Location: Left Arm)   Pulse 99   Temp 98 F (36.7 C)   Resp 16   Ht 4\' 11"  (1.499 m)   Wt 73.2 kg   SpO2 96%   BMI 32.59 kg/m  Constitutional: She appears well-developed.  Obese  HENT:  Head: Normocephalic and atraumatic.  Eyes: EOM are normal. Right eye exhibits no discharge. Left eye exhibits no discharge.  Neck: Normal range of motion. Neck supple. No thyromegaly present.  Cardiovascular: Normal rate and regular rhythm.  Respiratory: Effort normal and breath sounds normal.  GI: Soft. Bowel sounds are normal. She exhibits no distension.  Musculoskeletal:     Comments: No edema or tenderness in extremities  Neurological: She is alert.  Makes eye contact with examiner.  Provides her name and age.  She could not recall her full hospital course. Motor: Grossly 4-5/5 throughout [facility]  Skin:  Back brace in place with dressing due to back incision. Foam dressing in place to right foot  Psychiatric: Her affect is blunt. Her speech is delayed. She is slowed  Assessment/Plan: 1. Functional deficits secondary to lumbar discitis/osteomyelitis with epidural abscess complicated by cardiac arrest status post decompressive lumbar laminectomy and  posterior fixation which require 3+ hours per day of interdisciplinary therapy in a comprehensive inpatient rehab setting.  Physiatrist is providing close team supervision and 24 hour management of active medical problems listed below.  Physiatrist and rehab team continue to assess barriers to discharge/monitor patient progress toward functional and medical goals  Care Tool:  Bathing    Body parts bathed by patient: Right arm, Left arm, Chest, Abdomen, Left upper leg, Right upper leg, Face   Body parts bathed by helper: Front perineal area, Buttocks, Right lower leg, Left lower leg     Bathing assist Assist Level: Maximal Assistance - Patient 24 - 49%     Upper Body Dressing/Undressing Upper body dressing   What is the patient wearing?: Pull over shirt    Upper body assist Assist Level: Minimal Assistance - Patient > 75%    Lower Body Dressing/Undressing Lower body dressing      What is the patient wearing?: Incontinence brief, Pants     Lower body assist Assist for lower body dressing: Total Assistance - Patient < 25%     Toileting Toileting    Toileting assist Assist for toileting: Maximal Assistance - Patient 25 - 49%     Transfers Chair/bed transfer  Transfers assist     Chair/bed transfer assist level: Moderate Assistance - Patient 50 - 74%     Locomotion Ambulation   Ambulation assist  Walk 10 feet activity   Assist           Walk 50 feet activity   Assist           Walk 150 feet activity   Assist           Walk 10 feet on uneven surface  activity   Assist           Wheelchair     Assist   Type of Wheelchair: Manual    Wheelchair assist level: Minimal Assistance - Patient > 75% Max wheelchair distance: 150    Wheelchair 50 feet with 2 turns activity    Assist        Assist Level: Minimal Assistance - Patient > 75%   Wheelchair 150 feet activity     Assist     Assist  Level: Minimal Assistance - Patient > 75%      Medical Problem List and Plan: 1.  Decreased functional mobility lower extremity weakness secondary to MSSA lumbar spine discitis/osteomyelitis epidural abscess in the setting of bacteremia as well as right foot ulceration complicated by cardiac arrest.S/Pdecompressive lumbar laminectomy L-1-2 with hemi-facetectomy, posterior fixation T10-L3 07/11/2018 per Dr. Sherley Bounds. Back brace when out of bed applied in sitting position.  Begin CIR 2.  Antithrombotics: -DVT/anticoagulation:  SCDs.  Initial vascular study to 12/08/2018 negative             -antiplatelet therapy: none 3. Pain Management: MS Contin 15 mg every 12 hours, Robaxin and hydrocodone as needed.Monitor mental status secondary to narcotics 4. Mood: Provide emotional support             -antipsychotic agents: none 5. Neuropsych: This patient is capable of making decisions on her own behalf. 6. Skin/Wound Care:  Foam dressing to right foot change every 3 days as needed..Routine skin checks 7. Fluids/Electrolytes/Nutrition:  Routine in and out's with follow-up 8. ID/MSSA bacteremia/epidural abscess.IV Ancef 2 g every 8 hours,rifampin 300 mg every 12 hours initiated 07/11/2018 8 weeks. Follow-up infectious disease 9. Acute on chronic anemia. Patient has been transfused 2 units pack red blood cells 07/12/2018. Continue iron supplement  Hemoglobin 9.6 on 2/27  Continue to monitor 10. Essential hypertension. Lopressor 25 mg twice a day,Lasix 40 mg daily, lisinopril 10 mg daily.   Monitor with increased mobility 11. Diabetes mellitus with peripheral neuropathy. Hemoglobin A1c 8.7. Lantus insulin 20 units daily. Check blood sugars before meals and at bedtime.    Monitor with increased mobility 12. Urinary retention. Urecholine 10 mg 3 times a day.   PVRs ordered  UA ordered 13. B12 deficiency. Continue vitamin B-12 14.  Hyponatremia  Sodium 129 on 2/27  Continue to monitor 15.   Hypoalbuminemia  Supplement initiated on 2/27 16.  Constipation  Bowel meds increased on 2/27  Will consider Movantik   LOS: 1 days A FACE TO FACE EVALUATION WAS PERFORMED   Lorie Phenix 07/17/2018, 12:44 PM

## 2018-07-17 NOTE — Progress Notes (Signed)
PICC line assessment completed post 2nd TPA dose. Line flushes more easily; however, still no blood return. No signs/symptoms of mal-placement. Recommended discussion with MD regarding possible PICC line exchange if needed for continued treatment

## 2018-07-17 NOTE — Plan of Care (Signed)
  Problem: Consults Goal: RH GENERAL PATIENT EDUCATION Description See Patient Education module for education specifics. Outcome: Progressing Goal: Skin Care Protocol Initiated - if Braden Score 18 or less Description If consults are not indicated, leave blank or document N/A Outcome: Progressing Goal: Diabetes Guidelines if Diabetic/Glucose > 140 Description If diabetic or lab glucose is > 140 mg/dl - Initiate Diabetes/Hyperglycemia Guidelines & Document Interventions  Outcome: Progressing   Problem: RH BOWEL ELIMINATION Goal: RH STG MANAGE BOWEL WITH ASSISTANCE Description STG Manage Bowel with mod I Assistance.  Outcome: Progressing

## 2018-07-17 NOTE — Plan of Care (Signed)
  Problem: Consults Goal: RH GENERAL PATIENT EDUCATION Description See Patient Education module for education specifics. Outcome: Progressing Goal: Skin Care Protocol Initiated - if Braden Score 18 or less Description If consults are not indicated, leave blank or document N/A Outcome: Progressing Goal: Diabetes Guidelines if Diabetic/Glucose > 140 Description If diabetic or lab glucose is > 140 mg/dl - Initiate Diabetes/Hyperglycemia Guidelines & Document Interventions  Outcome: Progressing Goal: RH GENERAL PATIENT EDUCATION Description See Patient Education module for education specifics. Outcome: Progressing   Problem: RH BOWEL ELIMINATION Goal: RH STG MANAGE BOWEL WITH ASSISTANCE Description STG Manage Bowel with mod I Assistance.  Outcome: Progressing Goal: RH STG MANAGE BOWEL W/MEDICATION W/ASSISTANCE Description STG Manage Bowel with Medication with mod I Assistance.  Outcome: Progressing   Problem: RH SKIN INTEGRITY Goal: RH STG SKIN FREE OF INFECTION/BREAKDOWN Description Incision site to remain free from infection, bottom to remain free from skin breakdown with mod I assistance.  Outcome: Progressing Goal: RH STG MAINTAIN SKIN INTEGRITY WITH ASSISTANCE Description STG Maintain Skin Integrity With mod I Assistance.  Outcome: Progressing Goal: RH STG ABLE TO PERFORM INCISION/WOUND CARE W/ASSISTANCE Description STG Able To Perform Incision and Wound Care With mod I Assistance.  Outcome: Progressing   Problem: RH SAFETY Goal: RH STG ADHERE TO SAFETY PRECAUTIONS W/ASSISTANCE/DEVICE Description STG Adhere to Safety Precautions With TLSO brace donned at the bedside and min Assistance and ambulating with the appropriate assistive Device.  Outcome: Progressing   Problem: RH PAIN MANAGEMENT Goal: RH STG PAIN MANAGED AT OR BELOW PT'S PAIN GOAL Description <3 on a 0-10 pain scale.  Outcome: Progressing   Problem: RH KNOWLEDGE DEFICIT GENERAL Goal: RH STG INCREASE  KNOWLEDGE OF SELF CARE AFTER HOSPITALIZATION Description Patient will demonstrate how to manage medications, how to don TLSO brace, how to ambulate safely with the rolling walker, and follow-up care with the MD post discharge with min assist from rehab staff.  Outcome: Progressing

## 2018-07-17 NOTE — Progress Notes (Signed)
Occupational Therapy Session Note  Patient Details  Name: Kaitlin Vargas MRN: [id number] Date of Birth: [birth year]  Today's Date: 07/17/2018 OT Individual Time: 1415-1526 OT Individual Time Calculation (min): 71 min   Short Term Goals: Week 1:  OT Short Term Goal 1 (Week 1): Pt will tolerate standing for 2 mins with min A to increase endurance for BADL tasks OT Short Term Goal 2 (Week 1): Pt will complete 1 step of LB dressing task OT Short Term Goal 3 (Week 1): Pt will complete stand-piovt toilet transfer with min A OT Short Term Goal 4 (Week 1): Pt will complete sit<>stand with min A  Skilled Therapeutic Interventions/Progress Updates:  Pt greeted semi-reclined in bed, agreeable to OT treatment session and reporting need to go to the bathroom. Pt came to sitting EOB with mod A. Stand-pivot to [facility] w/ RW and Mod A. Pt needed total A for clothing management. Successful void of bladder and smear of BM. Pt states she is constipated and nursing is aware. Sit><stand from commode with mod A and RW. Worked on standing balance and endurance with standing connect 4 activity. Pt tolerated 4 bouts of standing for 3-4 minute intervals, extended rest breaks in between sets. Stand-pivot back to bed at end of session with mod A. TLSO removed in sitting, then log roll back to bed with mod A. Pt left semi-reclined in bed with call bell in reach and needs met. Bed alarm set.   Therapy Documentation Precautions:  Precautions Precautions: Fall, Back Precaution Comments: reviewed with patient, cueing to adhere to during tasks  Required Braces or Orthoses: Spinal Brace Spinal Brace: Applied in sitting position Restrictions Weight Bearing Restrictions: No Other Position/Activity Restrictions: (RN reports she plans to follow up with [employer]otech ) Pain: Pain Assessment Pain Scale: 0-10 Pain Score: 5  Pain Type: Acute pain;Surgical pain Pain Location: Back Pain Orientation: Mid;Lower Pain Descriptors / Indicators:  Aching Pain Frequency: Constant Pain Onset: On-going Pain Intervention(s): Repositioned   Therapy/Group: Individual Therapy  Valma Cava 07/17/2018, 2:56 PM

## 2018-07-17 NOTE — Progress Notes (Signed)
IV nurse consulted due to inability to flush PICC. Per IV Nurse, pt's PICC is difficult to flush and has no blood return. Dan Angiulli-PA notified and received new order for 2nd dose of TPA, initiate peripheral IV for present time to administer IV antibiotics temporarily and reconsult in am for possible replacement of PICC if needed. IV nurse notified

## 2018-07-18 ENCOUNTER — Inpatient Hospital Stay (HOSPITAL_COMMUNITY): Payer: Medicare HMO | Admitting: Occupational Therapy

## 2018-07-18 ENCOUNTER — Inpatient Hospital Stay (HOSPITAL_COMMUNITY): Payer: Medicare HMO

## 2018-07-18 DIAGNOSIS — R7309 Other abnormal glucose: Secondary | ICD-10-CM

## 2018-07-18 LAB — GLUCOSE, CAPILLARY
GLUCOSE-CAPILLARY: 110 mg/dL — AB (ref 70–99)
Glucose-Capillary: 130 mg/dL — ABNORMAL HIGH (ref 70–99)
Glucose-Capillary: 183 mg/dL — ABNORMAL HIGH (ref 70–99)
Glucose-Capillary: 84 mg/dL (ref 70–99)

## 2018-07-18 MED ORDER — HYDROCORTISONE ACETATE 25 MG RE SUPP
25.0000 mg | Freq: Two times a day (BID) | RECTAL | Status: DC
  Administered 2018-07-18 – 2018-07-25 (×11): 25 mg via RECTAL
  Filled 2018-07-18 (×20): qty 1

## 2018-07-18 NOTE — Progress Notes (Signed)
Occupational Therapy Session Note  Patient Details  Name: Bemnet Trovato MRN: [id number] Date of Birth: 10/03/1950  Today's Date: 07/18/2018     Patient missed session due to both prepping for 2nd attempt to remove impaction and c/o pain in abdominal area and rectum due to impaction and following attempts on yesterday evening to remove impaction.   Patient left in the care of nursing staff who were prepping for another impaction removal (previous attempt reported yesterday) General OT Amount of Missed Time: 60 Minutes(60)  Pain:"God I have so much pain back there and in my stomach.  They worked on me last night.   I feel sick.   They are going to have to try to get me unimpacted again.   I need something to knock me out."  Therapy/Group: Individual Therapy  Alfredia Ferguson [facility] 07/18/2018, 8:03 PM

## 2018-07-18 NOTE — Progress Notes (Signed)
Occupational Therapy Session Note  Patient Details  Name: Kaitlin Vargas MRN: [id number] Date of Birth: 10/16/1950  Today's Date: 07/18/2018 OT Individual Time: 1430-1530 OT Individual Time Calculation (min): 60 min   Short Term Goals: Week 1:  OT Short Term Goal 1 (Week 1): Pt will tolerate standing for 2 mins with min A to increase endurance for BADL tasks OT Short Term Goal 2 (Week 1): Pt will complete 1 step of LB dressing task OT Short Term Goal 3 (Week 1): Pt will complete stand-piovt toilet transfer with min A OT Short Term Goal 4 (Week 1): Pt will complete sit<>stand with min A  Skilled Therapeutic Interventions/Progress Updates:    Pt greeted semi-reclined in bed with nursing present taking medications. Pt agreeable to OT treatment session. Pt reports need to go to the bathroom. Stand-pivot to [facility] with RW and min A. Pt voided bladder. Total A for peri-care and to pull up brief. Stand-pivot then to wc with RW and mod A 2/2 LE fatigue. Grooming tasks completed sitting in wc at the sink, then pt needed max A to don shoes. Blocked practice of donning TLSO with pt needing mod A and verbal cues for technique. B UE strength/coordination with wc propulsion to and from dayroom. Worked on standing balance/endurance with horse shoe toss and standing marches. Sit<>stands with mod A and RW and CGA for balance when tossing horse shoes. Pt propelled wc back to room and left seated in wc with alarm belt on and needs met.   Therapy Documentation Precautions:  Precautions Precautions: Fall, Back Precaution Comments: reviewed with patient, cueing to adhere to during tasks  Required Braces or Orthoses: Spinal Brace Spinal Brace: Applied in sitting position Restrictions Weight Bearing Restrictions: No Other Position/Activity Restrictions: (RN reports she plans to follow up with orthotech ) Pain: Pain Assessment Pain Score: 5  Pain Type: Surgical pain;Acute pain Pain Location: Back Pain Descriptors /  Indicators: Discomfort;Sore Pain Onset: Gradual Pain Intervention(s): Ambulation/increased activity;Distraction(RN medication early in session)  Therapy/Group: Individual Therapy  Valma Cava 07/18/2018, 2:47 PM

## 2018-07-18 NOTE — Progress Notes (Signed)
Physical Therapy Session Note  Patient Details  Name: Angelene Rome MRN: [id number] Date of Birth: [birth year]  Today's Date: 07/18/2018 PT Individual Time: 0942-1052 PT Individual Time Calculation (min): 70 min   Short Term Goals: Week 1:  PT Short Term Goal 1 (Week 1): pt will perform functional transfers with min A PT Short Term Goal 2 (Week 1): pt will perform gait x 100' with min A in controlled environment PT Short Term Goal 3 (Week 1): pt will adhere to back precautions during mobility and locomotion with min cues, wiht TLSO  Skilled Therapeutic Interventions/Progress Updates:    Patient seated on BSC with RN and NT in room.  RN delivering meds.  Assist to stand and for hygiene and donning brief with pt holding RW.  Sat in w/c with max A to don TLSO.  Patient propelled w/c x 150' with S with bilateral UE's.  Performed sit to stan dto RW mod A for lifting help and ambulated with RW x 40' then RW unstable due to malfunction so replaced with new youth RW and pt ambulated 120' with min A.  Patient seated in w/c for LE therex consisting of LAQ 2 x 10 w/ 2# weight, hip flexion x 10 and hip abduction with yellow t-band 2 x 10.  Standing in parallel bars for side stepping with cues for foot placement and close S to minguard A.  Stepping forward in parallel bars through floor ladder for foot clearance and step length with minguard A.  Patient ambulated towards her room about 120' with RW and min A.  Then propelled in w/c rest of the way to her room.  Requesting to toilet so ambulated into bathroom with RW and min A.  Assisted to pull up brief after and pt ambulated to bed with min A.  Sit to sidelying with cues for technique and mod A for LE's.  Sidelying to sit with S and increased time,  Then back to supine through sidelying, pt able to bring her feet up with S/minguard for positioning.  Left in supine per pt request with bed alarm on and call bell and table in reach.  Therapy Documentation Precautions:   Precautions Precautions: Fall, Back Precaution Comments: reviewed with patient, cueing to adhere to during tasks  Required Braces or Orthoses: Spinal Brace Spinal Brace: Applied in sitting position Restrictions Weight Bearing Restrictions: No Other Position/Activity Restrictions: (RN reports she plans to follow up with orthotech ) Pain: Pain Assessment Pain Score: 5  Pain Type: Surgical pain;Acute pain Pain Location: Back Pain Descriptors / Indicators: Discomfort;Sore Pain Onset: Gradual Pain Intervention(s): Ambulation/increased activity;Distraction(RN medication early in session)    Therapy/Group: Individual Therapy  Reginia Naas  [place], PT 07/18/2018, 1:25 PM

## 2018-07-18 NOTE — Care Management (Signed)
Inpatient Rehabilitation Center Individual Statement of Services  Patient Name:  Kaitlin Vargas  Date:  07/18/2018  Welcome to the [facility].  Our goal is to provide you with an individualized program based on your diagnosis and situation, designed to meet your specific needs.  With this comprehensive rehabilitation program, you will be expected to participate in at least 3 hours of rehabilitation therapies Monday-Friday, with modified therapy programming on the weekends.  Your rehabilitation program will include the following services:  Physical Therapy (PT), Occupational Therapy (OT), 24 hour per day rehabilitation nursing, Therapeutic Recreaction (TR), Neuropsychology, Case Management (Social Worker), Rehabilitation Medicine, Nutrition Services and Pharmacy Services  Weekly team conferences will be held on Tuesdays to discuss your progress.  Your Social Worker will talk with you frequently to get your input and to update you on team discussions.  Team conferences with you and your family in attendance may also be held.  Expected length of stay: 14 days   Overall anticipated outcome: supervision  Depending on your progress and recovery, your program may change. Your Social Worker will coordinate services and will keep you informed of any changes. Your Social Worker's name and contact numbers are listed  below.  The following services may also be recommended but are not provided by the [facility] will be made to provide these services after discharge if needed.  Arrangements include referral to agencies that provide these services.  Your insurance has been verified to be:  [facility] Your primary doctor is:  [place]  Pertinent information will be shared with your doctor and your insurance company.  Social  Worker:  [place], [place] or ([phone]   Information discussed with and copy given to patient by: Lennart Pall, 07/18/2018, 1:43 PM

## 2018-07-18 NOTE — Progress Notes (Signed)
[facility] PHYSICAL MEDICINE & REHABILITATION PROGRESS NOTE  Subjective/Complaints: Patient seen laying in bed this AM.  She states she slept well overnight.  She states she had a good first day of therapies yesterday.   ROS: Denies CP, shortness of breath, nausea, vomiting, diarrhea.  Objective: Vital Signs: Blood pressure 136/78, pulse 96, temperature 98.1 F (36.7 C), resp. rate 18, height 4\' 11"  (1.499 m), weight 73.2 kg, SpO2 99 %. [place] Ekg Site Rite  Result Date: 07/17/2018 If [facility] image not attached, placement could not be confirmed due to current cardiac rhythm.  Recent Labs    07/17/18 0100  WBC 6.3  HGB 9.6*  HCT 31.2*  PLT 322   Recent Labs    07/17/18 0100  NA 129*  K 4.1  CL 93*  CO2 29  GLUCOSE 200*  BUN 17  CREATININE 0.57  CALCIUM 8.4*    Physical Exam: BP 136/78   Pulse 96   Temp 98.1 F (36.7 C)   Resp 18   Ht 4\' 11"  (1.499 m)   Wt 73.2 kg   SpO2 99%   BMI 32.59 kg/m  Constitutional: She appears well-developed.  Obese  HENT:  Head: Normocephalic and atraumatic.  Eyes: EOM are normal. Right eye exhibits no discharge. Left eye exhibits no discharge.  Cardiovascular: RRR. No JVD.  Respiratory: Effort normal and breath sounds normal.  GI:. Bowel sounds are normal. Nondistended Musculoskeletal: No edema or tenderness in extremities  Neurological: She is alert.  Makes eye contact with examiner.  Motor: Grossly 4-5/5 throughout, unchanged HOH  Skin: Foam dressing in place to right foot  Psychiatric: Her affect is blunt. Her speech is delayed. She is slowed  Assessment/Plan: 1. Functional deficits secondary to lumbar discitis/osteomyelitis with epidural abscess complicated by cardiac arrest status post decompressive lumbar laminectomy and posterior fixation which require 3+ hours per day of interdisciplinary therapy in a comprehensive inpatient rehab setting.  Physiatrist is providing close team supervision and 24 hour management of  active medical problems listed below.  Physiatrist and rehab team continue to assess barriers to discharge/monitor patient progress toward functional and medical goals  Care Tool:  Bathing    Body parts bathed by patient: Right arm, Left arm, Chest, Abdomen, Left upper leg, Right upper leg, Face   Body parts bathed by helper: Front perineal area, Buttocks, Right lower leg, Left lower leg     Bathing assist Assist Level: Maximal Assistance - Patient 24 - 49%     Upper Body Dressing/Undressing Upper body dressing   What is the patient wearing?: Pull over shirt    Upper body assist Assist Level: Minimal Assistance - Patient > 75%    Lower Body Dressing/Undressing Lower body dressing      What is the patient wearing?: Underwear/pull up     Lower body assist Assist for lower body dressing: Total Assistance - Patient < 25%     Toileting Toileting    Toileting assist Assist for toileting: Maximal Assistance - Patient 25 - 49%     Transfers Chair/bed transfer  Transfers assist     Chair/bed transfer assist level: Moderate Assistance - Patient 50 - 74%     Locomotion Ambulation   Ambulation assist              Walk 10 feet activity   Assist           Walk 50 feet activity   Assist  Walk 150 feet activity   Assist           Walk 10 feet on uneven surface  activity   Assist Walk 10 feet on uneven surfaces activity did not occur: Safety/medical concerns(fatigue; late entry)         Wheelchair     Assist   Type of Wheelchair: Manual    Wheelchair assist level: Minimal Assistance - Patient > 75% Max wheelchair distance: 150    Wheelchair 50 feet with 2 turns activity    Assist        Assist Level: Minimal Assistance - Patient > 75%   Wheelchair 150 feet activity     Assist     Assist Level: Minimal Assistance - Patient > 75%      Medical Problem List and Plan: 1.  Decreased functional  mobility lower extremity weakness secondary to MSSA lumbar spine discitis/osteomyelitis epidural abscess in the setting of bacteremia as well as right foot ulceration complicated by cardiac arrest.S/Pdecompressive lumbar laminectomy L-1-2 with hemi-facetectomy, posterior fixation T10-L3 07/11/2018 per Dr. Sherley Bounds. Back brace when out of bed applied in sitting position.  Cont CIR 2.  Antithrombotics: -DVT/anticoagulation:  SCDs.  Initial vascular study to 12/08/2018 negative             -antiplatelet therapy: none 3. Pain Management: MS Contin 15 mg every 12 hours, Robaxin and hydrocodone as needed.Monitor mental status secondary to narcotics  Controlled at present 4. Mood: Provide emotional support             -antipsychotic agents: none 5. Neuropsych: This patient is capable of making decisions on her own behalf. 6. Skin/Wound Care:  Foam dressing to right foot change every 3 days as needed..Routine skin checks 7. Fluids/Electrolytes/Nutrition:  Routine in and out's with follow-up 8. ID/MSSA bacteremia/epidural abscess.IV Ancef 2 g every 8 hours,rifampin 300 mg every 12 hours initiated 07/11/2018 8 weeks. Follow-up infectious disease 9. Acute on chronic anemia. Patient has been transfused 2 units pack red blood cells 07/12/2018. Continue iron supplement  Hemoglobin 9.6 on 2/27  Labs ordered for Monday.  Continue to monitor 10. Essential hypertension. Lopressor 25 mg twice a day,Lasix 40 mg daily, lisinopril 10 mg daily.   Relatively controlled on 2/28  Monitor with increased mobility 11. Diabetes mellitus with peripheral neuropathy. Hemoglobin A1c 8.7. Lantus insulin 20 units daily. Check blood sugars before meals and at bedtime.    Labile on 2/28, monitor for trend  Monitor with increased mobility 12. Urinary retention. Urecholine 10 mg 3 times a day.   PVRs ordered, performed times 1, showing retention  Allergic to Sulfa  UA ordered, pending 13. B12 deficiency. Continue vitamin  B-12 14.  Hyponatremia  Sodium 129 on 2/27  Labs ordered for Monday  Continue to monitor 15.  Hypoalbuminemia  Supplement initiated on 2/27 16.  Constipation  Bowel meds increased on 2/27  Will consider Movantik if necessary  LOS: 2 days A FACE TO FACE EVALUATION WAS PERFORMED   Lorie Phenix 07/18/2018, 10:31 AM

## 2018-07-18 NOTE — Progress Notes (Signed)
Below is a copy of the original SW assessment from prior CIR admission.  Reviewed with pt and no changes needed.  Lennart Pall, LCSW        Dupree, Gardiner Rhyme, LCSW  Social Worker  Physical Medicine and Rehabilitation  Progress Notes  Signed  Date of Service:  06/27/2018 11:37 AM       Related encounter: Admission (Discharged) from 06/26/2018 in [facility]         Show:Clear all [x] Manual[x] Template[] Copied  Added by: [x] Dupree, Gardiner Rhyme, LCSW  [] Hover for details Social Work  Social Work Assessment and Plan  Patient Details  Name: Kaitlin Vargas MRN: [id number] Date of Birth: 1951-04-09  Today's Date: 06/27/2018  Problem List:      Patient Active Problem List   Diagnosis Date Noted  . Lumbar discitis 06/26/2018  . Cardiac arrest, cause unspecified ([place])   . Acute encephalopathy   . MSSA bacteremia 06/17/2018  . 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 DKA (diabetic ketoacidosis) ([place]) 06/16/2018  . Diabetic foot ulcer ([place]) 06/16/2018  . Acute hypoxemic respiratory failure ([place]) 06/07/2018  . Essential hypertension 06/07/2018  . Type 2 diabetes mellitus with hyperlipidemia ([place]) 06/07/2018  . Anemia 06/07/2018  . Acute bronchitis 06/07/2018   Past Medical History:      Past Medical History:  Diagnosis Date  . Anemia   . Diabetes mellitus without complication ([place])   . Hyperlipidemia   . Hypertension   . Peripheral neuropathy    Past Surgical History:       Past Surgical History:  Procedure Laterality Date  . ABDOMINAL HYSTERECTOMY    . IR LUMBAR DISC ASPIRATION W/IMG GUIDE  06/18/2018   Social History:  reports that she has never smoked. She has never used smokeless tobacco. She reports that she does not drink alcohol or use drugs.  Family / Support Systems Marital Status: Married Patient Roles: Spouse, Parent Spouse/Significant Other: John [phone] Children: Cindy-daughter lives  wiht them and another local daughter with grandchildren Other Supports: Church members and freinds Anticipated Caregiver: Husband and Cindy Ability/Limitations of Caregiver: Husband is retired and Programmer, systems works third shift Caregiver Availability: 24/7 Family Dynamics: Close knit family both daughter's are involved and supportive. Pt has sister's who are supportive and come by to visit her. Between their family and friends they have good support.  Social History Preferred language: English Religion:  Cultural Background: No issues Education: High School Read: Yes Write: Yes Employment Status: Retired Public relations account executive Issues: No issues Guardian/Conservator: None-according to MD pt is capable of making her own decisions while here   Abuse/Neglect Abuse/Neglect Assessment Can Be Completed: Yes Physical Abuse: Denies Verbal Abuse: Denies Sexual Abuse: Denies Exploitation of patient/patient's resources: Denies Self-Neglect: Denies  Emotional Status Pt's affect, behavior and adjustment status: Pt is motivated to do well and get back to her independent level. She realizes she will have pain but is hopeful this will get better each day. She does want to be pre-medicated prior to therapies, she will remind nurse of this. She is glad they finally found what was causing her pain. Recent Psychosocial Issues: other health issues was recently discharged from [facility] for bronchitis Psychiatric History: No history deferred depression screen due to coping appropriately with her back issues and IV antibiotics she will need until 3/25. Will monitor while here to see if would need to see neuro-psych while here Substance Abuse History: No issues  Patient / Family Perceptions,  Expectations & Goals Pt/Family understanding of illness & functional limitations: Pt and husband can explain her back issues and infection. Both talk with the MD's and feel they have a good understanding of her treatment plan  going forward. She is glad to be on the last leg of her journey before going home. Premorbid pt/family roles/activities: Wife, Mother, grandmother, retiree, church member, sister, etc Anticipated changes in roles/activities/participation: resume Pt/family expectations/goals: Pt states: " I hope to be in less pain and moving better, I will do my best while I am here."  Husband states: " She tries hard but I will help her and so will our daughter's if needed."  [employer]: None Premorbid Home Care/DME Agencies: None Transportation available at discharge: Family  Discharge Planning Living Arrangements: Spouse/significant other, Children Support Systems: Spouse/significant other, Children, Other relatives, Water engineer, Social worker community Type of Residence: Private residence Insurance underwriter Resources: Multimedia programmer (specify)([employer] Medicare) Financial Resources: Radio broadcast assistant Screen Referred: No Living Expenses: Own Money Management: Spouse, Patient Does the patient have any problems obtaining your medications?: No Home Management: Husband and daughter but pt does cook some when she feels up to it Patient/Family Preliminary Plans: Return home with husband and daughter assisting if needed. Prior to admission husband was having to help her due to her decline in function before MD's found the infection. Husband is in good health and able to assist. Social Work Anticipated Follow Up Needs: HH/OP  Clinical Impression Pleasant female who is willing to work hard and push through her pain to regain her independence. She is aware she will be on IV antibiotics until 3/25. She has never had home health but aware this will be arranged prior to her going home form rehab. Will await therapy evaluations and work on discharge needs. Her family is very involved and supportive.  Elease Hashimoto 06/27/2018, 11:37 AM

## 2018-07-19 ENCOUNTER — Inpatient Hospital Stay (HOSPITAL_COMMUNITY): Payer: Medicare HMO | Admitting: Occupational Therapy

## 2018-07-19 ENCOUNTER — Inpatient Hospital Stay (HOSPITAL_COMMUNITY): Payer: Medicare HMO | Admitting: Physical Therapy

## 2018-07-19 LAB — GLUCOSE, CAPILLARY
Glucose-Capillary: 109 mg/dL — ABNORMAL HIGH (ref 70–99)
Glucose-Capillary: 188 mg/dL — ABNORMAL HIGH (ref 70–99)
Glucose-Capillary: 211 mg/dL — ABNORMAL HIGH (ref 70–99)
Glucose-Capillary: 164 mg/dL — ABNORMAL HIGH (ref 70–99)

## 2018-07-19 MED ORDER — SODIUM CHLORIDE 0.9 % IV SOLN
INTRAVENOUS | Status: DC | PRN
  Administered 2018-07-19 – 2018-07-20 (×2): via INTRAVENOUS

## 2018-07-19 NOTE — Progress Notes (Signed)
Physical Therapy Session Note  Patient Details  Name: Kaitlin Vargas MRN: [id number] Date of Birth: [birth year]  Today's Date: 07/19/2018 PT Individual Time: 0900-1000 PT Individual Time Calculation (min): 60 min   Short Term Goals: Week 1:  PT Short Term Goal 1 (Week 1): pt will perform functional transfers with min A PT Short Term Goal 2 (Week 1): pt will perform gait x 100' with min A in controlled environment PT Short Term Goal 3 (Week 1): pt will adhere to back precautions during mobility and locomotion with min cues, wiht TLSO  Skilled Therapeutic Interventions/Progress Updates: Pt presented at EOB with nsg present having transferred [facility]. Pt initially required encouragement to participate in therapy due to pain from constipation. Pt With encouragement from nsg and PTA pt agreeable to therapy. Pt receiving pain meds prior to leaving room. Pt donned TLSO modA to maintain spinal precautions. Pt performed stand pivot to w/c minA and pt propelled to rehab gym supervision with x 1 rest break due to fatigue. Participated in gait training with RW x 137f with cues for erect posture. Pt participated in seated and standing therex as follows with all performed to fatigue (approx 10-15 reps bialterally): Seated Ankle pumps LAQ Ball adductor squeezes Hamstring pulls with level 1 resistance band  Standing at parallel bars Hip flexion Standing SLR Hip abd/add Hamstring curls Mini-squats  All activities required min cues for technique and to avoid excessive bending with standing activities. Pt then transported to day room and performed stand pivot to NuStep. Participated in L1 x 2 min then pt stating increased pain at "tailbone" with pt requesting to terminate. Pt ambulated back to room with RW CGA approx 120 ft. Pt required modA sit to supine for LE management as pt able to lift LLE but required assist for RLE. Pt also required verbal cues to maintain spinal precautions when transitioning to  supine as pt attempted to twist. Pt able to reposition self to comfort. Pt left in bed with call bell within reach, bed alarm on, and needs met.       Therapy Documentation Precautions:  Precautions Precautions: Fall, Back Precaution Comments: reviewed with patient, cueing to adhere to during tasks  Required Braces or Orthoses: Spinal Brace Spinal Brace: Applied in sitting position Restrictions Weight Bearing Restrictions: No Other Position/Activity Restrictions: (RN reports she plans to follow up with orthotech ) General:   Vital Signs:  Pain: Pain Assessment Pain Scale: 0-10 Pain Score: 5  Pain Type: Surgical pain Pain Location: Back Pain Orientation: Lower Pain Descriptors / Indicators: Aching Pain Onset: On-going Pain Intervention(s): Repositioned;Ambulation/increased activity   Therapy/Group: Individual Therapy  Bassam Dresch  Quentina Fronek, PTA  07/19/2018, 12:09 PM

## 2018-07-19 NOTE — Progress Notes (Signed)
Occupational Therapy Session Note  Patient Details  Name: Kaitlin Vargas MRN: [id number] Date of Birth: September 14, 1950  Today's Date: 07/19/2018 OT Individual Time: 1400-1459 OT Individual Time Calculation (min): 59 min   2 Short Term Goals: Week 1:  OT Short Term Goal 1 (Week 1): Pt will tolerate standing for 2 mins with min A to increase endurance for BADL tasks OT Short Term Goal 2 (Week 1): Pt will complete 1 step of LB dressing task OT Short Term Goal 3 (Week 1): Pt will complete stand-piovt toilet transfer with min A OT Short Term Goal 4 (Week 1): Pt will complete sit<>stand with min A  Skilled Therapeutic Interventions/Progress Updates:    Pt greeted semi-reclined in bed and agreeable to OT treatment session. Pt came to sitting EOB with HOB elevated and min A. Pt with improved carryover of how to don TLSO brace and was able to do with min A this afternoon. Pt reported need to urinate and ambulated to the bathroom w/ RW and min A. Max A for clothing management and peri-care. Pt ambulated back out of bathroom and tolerated standing at the sink to wash hands for 1 minute. Verbal cues for RW placement at the sink. Standing balance and knee extension with standing toe taps on small cones. Pt unable to achieve enough hip extension to reach the top of cones, so graded activity to toe tapping on cups. Min A for balance. Progressed to seated toe taps for hip strengthening and LE coordination. Pt ambulated 120 feet with RW and CGA. OT returned pt to room via wc for time management. Pt left seated in wc with alarm belt on and call bell in reach.    Therapy Documentation Precautions:  Precautions Precautions: Fall, Back Precaution Comments: reviewed with patient, cueing to adhere to during tasks  Required Braces or Orthoses: Spinal Brace Spinal Brace: Applied in sitting position Restrictions Weight Bearing Restrictions: No Other Position/Activity Restrictions: (RN reports she plans to follow up with  [employer] ) Pain: Pain Assessment Pain Scale: 0-10 Pain Score: 7  Pain Type: Surgical pain Pain Location: Back Pain Orientation: Lower Pain Descriptors / Indicators: Aching  Pain Onset: On-going Pain Intervention(s): Repositioned;Ambulation/increased activity   Therapy/Group: Individual Therapy  Valma Cava 07/19/2018, 2:17 PM

## 2018-07-19 NOTE — Progress Notes (Signed)
[place] PHYSICAL MEDICINE & REHABILITATION PROGRESS NOTE  Subjective/Complaints: Denies bowel or bladder issues  ROS: Denies CP, shortness of breath, nausea, vomiting, diarrhea.  Objective: Vital Signs: Blood pressure (!) 145/77, pulse 96, temperature 97.8 F (36.6 C), resp. rate 19, height 4\' 11"  (1.499 m), weight 73.2 kg, SpO2 97 %. [place] Ekg Site Rite  Result Date: 07/17/2018 If [facility] image not attached, placement could not be confirmed due to current cardiac rhythm.  Recent Labs    07/17/18 0100  WBC 6.3  HGB 9.6*  HCT 31.2*  PLT 322   Recent Labs    07/17/18 0100  NA 129*  K 4.1  CL 93*  CO2 29  GLUCOSE 200*  BUN 17  CREATININE 0.57  CALCIUM 8.4*    Physical Exam: BP (!) 145/77 (BP Location: Left Arm)   Pulse 96   Temp 97.8 F (36.6 C)   Resp 19   Ht 4\' 11"  (1.499 m)   Wt 73.2 kg   SpO2 97%   BMI 32.59 kg/m  Constitutional: She appears well-developed.  Obese  HENT:  Head: Normocephalic and atraumatic.  Eyes: EOM are normal. Right eye exhibits no discharge. Left eye exhibits no discharge.  Cardiovascular: RRR. No JVD.  Respiratory: Effort normal and breath sounds normal.  GI:. Bowel sounds are normal. Nondistended Musculoskeletal: No edema or tenderness in extremities  Neurological: She is alert.  Makes eye contact with examiner.  Motor: Grossly 4-5/5 throughout, unchanged HOH  Skin: Foam dressing in place to right foot  Psychiatric: Her affect is blunt. Her speech is delayed. She is slowed  Assessment/Plan: 1. Functional deficits secondary to lumbar discitis/osteomyelitis with epidural abscess complicated by cardiac arrest status post decompressive lumbar laminectomy and posterior fixation which require 3+ hours per day of interdisciplinary therapy in a comprehensive inpatient rehab setting.  Physiatrist is providing close team supervision and 24 hour management of active medical problems listed below.  Physiatrist and rehab team continue  to assess barriers to discharge/monitor patient progress toward functional and medical goals  Care Tool:  Bathing    Body parts bathed by patient: Right arm, Left arm, Chest, Abdomen, Left upper leg, Right upper leg, Face   Body parts bathed by helper: Front perineal area, Buttocks, Right lower leg, Left lower leg     Bathing assist Assist Level: Maximal Assistance - Patient 24 - 49%     Upper Body Dressing/Undressing Upper body dressing   What is the patient wearing?: Pull over shirt Orthosis activity level: Performed by helper  Upper body assist Assist Level: Maximal Assistance - Patient 25 - 49%    Lower Body Dressing/Undressing Lower body dressing      What is the patient wearing?: Pants, Incontinence brief     Lower body assist Assist for lower body dressing: Maximal Assistance - Patient 25 - 49%     Toileting Toileting    Toileting assist Assist for toileting: Total Assistance - Patient < 25%     Transfers Chair/bed transfer  Transfers assist     Chair/bed transfer assist level: Moderate Assistance - Patient 50 - 74%     Locomotion Ambulation   Ambulation assist      Assist level: Minimal Assistance - Patient > 75% Assistive device: Walker-rolling Max distance: 120'   Walk 10 feet activity   Assist     Assist level: Minimal Assistance - Patient > 75% Assistive device: Walker-rolling   Walk 50 feet activity   Assist    Assist level:  Minimal Assistance - Patient > 75% Assistive device: Walker-rolling    Walk 150 feet activity   Assist           Walk 10 feet on uneven surface  activity   Assist Walk 10 feet on uneven surfaces activity did not occur: Safety/medical concerns         Wheelchair     Assist   Type of Wheelchair: Manual    Wheelchair assist level: Supervision/Verbal cueing Max wheelchair distance: 150    Wheelchair 50 feet with 2 turns activity    Assist        Assist Level:  Supervision/Verbal cueing   Wheelchair 150 feet activity     Assist     Assist Level: Supervision/Verbal cueing      Medical Problem List and Plan: 1.  Decreased functional mobility lower extremity weakness secondary to MSSA lumbar spine discitis/osteomyelitis epidural abscess in the setting of bacteremia as well as right foot ulceration complicated by cardiac arrest.S/Pdecompressive lumbar laminectomy L-1-2 with hemi-facetectomy, posterior fixation T10-L3 07/11/2018 per Dr. Sherley Bounds. Back brace when out of bed applied in sitting position.  Cont CIR 2.  Antithrombotics: -DVT/anticoagulation:  SCDs.  Initial vascular study to 12/08/2018 negative             -antiplatelet therapy: none 3. Pain Management: MS Contin 15 mg every 12 hours, Robaxin and hydrocodone as needed.Monitor mental status secondary to narcotics  Controlled at present 2/29 4. Mood: Provide emotional support             -antipsychotic agents: none 5. Neuropsych: This patient is capable of making decisions on her own behalf. 6. Skin/Wound Care:  Foam dressing to right foot change every 3 days as needed..Routine skin checks 7. Fluids/Electrolytes/Nutrition:  Routine in and out's with follow-up 8. ID/MSSA bacteremia/epidural abscess.IV Ancef 2 g every 8 hours,rifampin 300 mg every 12 hours initiated 07/11/2018 8 weeks. Follow-up infectious disease 9. Acute on chronic anemia. Patient has been transfused 2 units pack red blood cells 07/12/2018. Continue iron supplement  Hemoglobin 9.6 on 2/27  Labs ordered for Monday.  Continue to monitor 10. Essential hypertension. Lopressor 25 mg twice a day,Lasix 40 mg daily, lisinopril 10 mg daily.   Relatively controlled on 2/28  Monitor with increased mobility 11. Diabetes mellitus with peripheral neuropathy. Hemoglobin A1c 8.7. Lantus insulin 20 units daily. Check blood sugars before meals and at bedtime.    Labile on 2/28, monitor for trend  Monitor with increased  mobility 12. Urinary retention. Urecholine 10 mg 3 times a day.   PVRs ordered, performed times 1, showing retention  Allergic to Sulfa  UA ordered, pending 13. B12 deficiency. Continue vitamin B-12 14.  Hyponatremia  Sodium 129 on 2/27  Labs ordered for Monday  Continue to monitor 15.  Hypoalbuminemia  Supplement initiated on 2/27 16.  Constipation  Bowel meds increased on 2/27  Liquid BM this am  LOS: 3 days A FACE TO [place] E Dahmir Epperly 07/19/2018, 10:44 AM

## 2018-07-19 NOTE — Progress Notes (Signed)
Occupational Therapy Session Note  Patient Details  Name: Kaitlin Vargas MRN: [id number] Date of Birth: 1950/07/05  Today's Date: 07/19/2018 OT Individual Time: 1100-1210 OT Individual Time Calculation (min): 70 min   Short Term Goals: Week 1:  OT Short Term Goal 1 (Week 1): Pt will tolerate standing for 2 mins with min A to increase endurance for BADL tasks OT Short Term Goal 2 (Week 1): Pt will complete 1 step of LB dressing task OT Short Term Goal 3 (Week 1): Pt will complete stand-piovt toilet transfer with min A OT Short Term Goal 4 (Week 1): Pt will complete sit<>stand with min A  Skilled Therapeutic Interventions/Progress Updates:    Pt declined initial OT session 2/2 back pain and discomfort from enema. OT returned at a later time and pt agreeable. Pt greeted semi-reclined in bed and agreeable to OT treatment session. Pt reports need to go to the bathroom. Pt came to sitting EOB with mod A and HOB elevated. Pt sat EOB and OT educated on how to don TLSO. Pt still needed assistance 2/2 body habitus and will need more practice with how the fasteners and velcro work with this brace. Sit<>stand with min A and RW. Pt then ambulated into bathroom with RW and min A. Pt sat on regular commode and voided bladder, unable to have a BM, but feels very constipated. Ptthen ambulated out of bathroom and OT put new brief on patient. Blocked practice for LB dressing strategies with pt able to achieve figure 4 position with R LE and min A from OT. Pt then could thread R LE into pants, but needed assistance for LLE. Sit<>stand at the sink with min A, and A to pull pants over hips in standing. Pt returned to sitting in wc, and OT reviewed again how to manage TLSO brace, mod A to remove brace 2/2 pt having difficulty with shoulder straps.  UB bathing completed with min A, set-up A to don shirt. B UE strengthening with hair washing task using shampoo cap. OT braided pt's hair to decrease tangles. Pt left seated in wc at  end of session with alarm belt on and needs met.    Therapy Documentation Precautions:  Precautions Precautions: Fall, Back Precaution Comments: reviewed with patient, cueing to adhere to during tasks  Required Braces or Orthoses: Spinal Brace Spinal Brace: Applied in sitting position Restrictions Weight Bearing Restrictions: No Other Position/Activity Restrictions: (RN reports she plans to follow up with orthotech ) Pain: Pain Assessment Pain Scale: 0-10 Pain Score: 5  Pain Type: Surgical pain Pain Location: Back Pain Orientation: Lower Pain Descriptors / Indicators: Aching Pain Onset: On-going Pain Intervention(s): Repositioned;Ambulation/increased activity   Therapy/Group: Individual Therapy  Valma Cava 07/19/2018, 11:23 AM

## 2018-07-20 LAB — GLUCOSE, CAPILLARY
GLUCOSE-CAPILLARY: 228 mg/dL — AB (ref 70–99)
Glucose-Capillary: 191 mg/dL — ABNORMAL HIGH (ref 70–99)
Glucose-Capillary: 194 mg/dL — ABNORMAL HIGH (ref 70–99)
Glucose-Capillary: 257 mg/dL — ABNORMAL HIGH (ref 70–99)

## 2018-07-20 NOTE — Progress Notes (Signed)
Upon digital examination of rectum. Hard stool noted. Fleets enema administered. Pt assisted to bedside commode. Pt passed small amount of stool. Moderate amount stool digitally disimpacted.Marland Kitchen

## 2018-07-20 NOTE — Progress Notes (Signed)
[place] PHYSICAL MEDICINE & REHABILITATION PROGRESS NOTE  Subjective/Complaints: Very large BM reported this am  ROS: Denies CP, shortness of breath, nausea, vomiting, diarrhea.  Objective: Vital Signs: Blood pressure (!) 142/77, pulse (!) 102, temperature 98.2 F (36.8 C), resp. rate 20, height 4\' 11"  (1.499 m), weight 73.2 kg, SpO2 100 %. No results found. No results for input(s): WBC, HGB, HCT, PLT in the last 72 hours. No results for input(s): NA, K, CL, CO2, GLUCOSE, BUN, CREATININE, CALCIUM in the last 72 hours.  Physical Exam: BP (!) 142/77 (BP Location: Left Arm)   Pulse (!) 102   Temp 98.2 F (36.8 C)   Resp 20   Ht 4\' 11"  (1.499 m)   Wt 73.2 kg   SpO2 100%   BMI 32.59 kg/m  Constitutional: She appears well-developed.  Obese  HENT:  Head: Normocephalic and atraumatic.  Eyes: EOM are normal. Right eye exhibits no discharge. Left eye exhibits no discharge.  Cardiovascular: RRR. No JVD.  Respiratory: Effort normal and breath sounds normal.  GI:. Bowel sounds are normal. Nondistended, no tenderness to palpation Musculoskeletal: No edema or tenderness in extremities  Neurological: She is alert.  Makes eye contact with examiner.  Motor: Grossly 4-5/5 throughout, unchanged HOH  Skin: Foam dressing in place to right foot  Psychiatric: Her affect is blunt. Her speech is delayed. She is slowed  Assessment/Plan: 1. Functional deficits secondary to lumbar discitis/osteomyelitis with epidural abscess complicated by cardiac arrest status post decompressive lumbar laminectomy and posterior fixation which require 3+ hours per day of interdisciplinary therapy in a comprehensive inpatient rehab setting.  Physiatrist is providing close team supervision and 24 hour management of active medical problems listed below.  Physiatrist and rehab team continue to assess barriers to discharge/monitor patient progress toward functional and medical goals  Care Tool:  Bathing     Body parts bathed by patient: Right arm, Left arm, Chest, Abdomen, Left upper leg, Right upper leg, Face   Body parts bathed by helper: Front perineal area, Buttocks, Right lower leg, Left lower leg     Bathing assist Assist Level: Moderate Assistance - Patient 50 - 74%     Upper Body Dressing/Undressing Upper body dressing   What is the patient wearing?: Orthosis, Pull over shirt Orthosis activity level: Performed by helper  Upper body assist Assist Level: Maximal Assistance - Patient 25 - 49%    Lower Body Dressing/Undressing Lower body dressing      What is the patient wearing?: Pants, Incontinence brief     Lower body assist Assist for lower body dressing: Moderate Assistance - Patient 50 - 74%     Toileting Toileting    Toileting assist Assist for toileting: Total Assistance - Patient < 25%     Transfers Chair/bed transfer  Transfers assist     Chair/bed transfer assist level: Minimal Assistance - Patient > 75%     Locomotion Ambulation   Ambulation assist      Assist level: Contact Guard/Touching assist Assistive device: Walker-rolling Max distance: 164ft   Walk 10 feet activity   Assist     Assist level: Contact Guard/Touching assist Assistive device: Walker-rolling   Walk 50 feet activity   Assist    Assist level: Contact Guard/Touching assist Assistive device: Walker-rolling    Walk 150 feet activity   Assist           Walk 10 feet on uneven surface  activity   Assist Walk 10 feet on uneven surfaces activity did  not occur: Safety/medical concerns         Wheelchair     Assist   Type of Wheelchair: Manual    Wheelchair assist level: Supervision/Verbal cueing Max wheelchair distance: 150    Wheelchair 50 feet with 2 turns activity    Assist        Assist Level: Supervision/Verbal cueing   Wheelchair 150 feet activity     Assist     Assist Level: Supervision/Verbal cueing      Medical  Problem List and Plan: 1.  Decreased functional mobility lower extremity weakness secondary to MSSA lumbar spine discitis/osteomyelitis epidural abscess in the setting of bacteremia as well as right foot ulceration complicated by cardiac arrest.S/Pdecompressive lumbar laminectomy L-1-2 with hemi-facetectomy, posterior fixation T10-L3 07/11/2018 per Dr. Sherley Bounds. Back brace when out of bed applied in sitting position.  Cont CIR 2.  Antithrombotics: -DVT/anticoagulation:  SCDs.  Initial vascular study to 12/08/2018 negative             -antiplatelet therapy: none 3. Pain Management: MS Contin 15 mg every 12 hours, Robaxin and hydrocodone as needed.Monitor mental status secondary to narcotics  Controlled at present 3/1 4. Mood: Provide emotional support             -antipsychotic agents: none 5. Neuropsych: This patient is capable of making decisions on her own behalf. 6. Skin/Wound Care:  Foam dressing to right foot change every 3 days as needed..Routine skin checks 7. Fluids/Electrolytes/Nutrition:  Routine in and out's with follow-up 8. ID/MSSA bacteremia/epidural abscess.IV Ancef 2 g every 8 hours,rifampin 300 mg every 12 hours initiated 07/11/2018 8 weeks. Follow-up infectious disease 9. Acute on chronic anemia. Patient has been transfused 2 units pack red blood cells 07/12/2018. Continue iron supplement  Hemoglobin 9.6 on 2/27  Labs ordered for Monday.  Continue to monitor 10. Essential hypertension. Lopressor 25 mg twice a day,Lasix 40 mg daily, lisinopril 10 mg daily.   Relatively controlled on 2/28  Monitor with increased mobility 11. Diabetes mellitus with peripheral neuropathy. Hemoglobin A1c 8.7. Lantus insulin 20 units daily. Check blood sugars before meals and at bedtime.    Labile on 2/28, monitor for trend  Monitor with increased mobility 12. Urinary retention. Urecholine 10 mg 3 times a day.   PVRs ordered, performed times 1, showing retention  Allergic to Sulfa  UA  ordered, pending 13. B12 deficiency. Continue vitamin B-12 14.  Hyponatremia  Sodium 129 on 2/27  Labs ordered for Monday  Continue to monitor 15.  Hypoalbuminemia  Supplement initiated on 2/27 16.  Constipation- opioids and immobility as risk factors  Bowel meds increased on 2/27  Large BM 3/1  LOS: 4 days A FACE TO FACE EVALUATION WAS PERFORMED  Charlett Blake 07/20/2018, 10:47 AM

## 2018-07-21 ENCOUNTER — Inpatient Hospital Stay (HOSPITAL_COMMUNITY): Payer: Medicare HMO | Admitting: Physical Therapy

## 2018-07-21 ENCOUNTER — Inpatient Hospital Stay (HOSPITAL_COMMUNITY): Payer: Medicare HMO | Admitting: Occupational Therapy

## 2018-07-21 DIAGNOSIS — M4646 Discitis, unspecified, lumbar region: Secondary | ICD-10-CM

## 2018-07-21 LAB — GLUCOSE, CAPILLARY
GLUCOSE-CAPILLARY: 253 mg/dL — AB (ref 70–99)
GLUCOSE-CAPILLARY: 137 mg/dL — AB (ref 70–99)
GLUCOSE-CAPILLARY: 116 mg/dL — AB (ref 70–99)
Glucose-Capillary: 233 mg/dL — ABNORMAL HIGH (ref 70–99)

## 2018-07-21 LAB — BASIC METABOLIC PANEL
Anion gap: 9 (ref 5–15)
BUN: 13 mg/dL (ref 8–23)
CO2: 29 mmol/L (ref 22–32)
Calcium: 8.5 mg/dL — ABNORMAL LOW (ref 8.9–10.3)
Chloride: 94 mmol/L — ABNORMAL LOW (ref 98–111)
Creatinine, Ser: 0.51 mg/dL (ref 0.44–1.00)
GFR calc Af Amer: >60 mL/min (ref >60)
GFR calc non Af Amer: >60 mL/min (ref >60)
Glucose, Bld: 154 mg/dL — ABNORMAL HIGH (ref 70–99)
Potassium: 3.7 mmol/L (ref 3.5–5.1)
Sodium: 132 mmol/L — ABNORMAL LOW (ref 135–145)

## 2018-07-21 LAB — CBC WITH DIFFERENTIAL/PLATELET
Abs Immature Granulocytes: 0.05 10*3/uL (ref 0.00–0.07)
Basophils Absolute: 0.1 10*3/uL (ref 0.0–0.1)
Basophils Relative: 1 %
Eosinophils Absolute: 0.1 10*3/uL (ref 0.0–0.5)
Eosinophils Relative: 1 %
HCT: 30.8 % — ABNORMAL LOW (ref 36.0–46.0)
HEMOGLOBIN: 9.9 g/dL — AB (ref 12.0–15.0)
Immature Granulocytes: 1 %
Lymphocytes Relative: 25 %
Lymphs Abs: 1.6 10*3/uL (ref 0.7–4.0)
MCH: 30.7 pg (ref 26.0–34.0)
MCHC: 32.1 g/dL (ref 30.0–36.0)
MCV: 95.7 fL (ref 80.0–100.0)
MONOS PCT: 11 %
Monocytes Absolute: 0.7 10*3/uL (ref 0.1–1.0)
Neutro Abs: 3.9 10*3/uL (ref 1.7–7.7)
Neutrophils Relative %: 61 %
PLATELETS: 326 10*3/uL (ref 150–400)
RBC: 3.22 MIL/uL — ABNORMAL LOW (ref 3.87–5.11)
RDW: 15.9 % — ABNORMAL HIGH (ref 11.5–15.5)
WBC: 6.4 10*3/uL (ref 4.0–10.5)
nRBC: 0 % (ref 0.0–0.2)

## 2018-07-21 MED ORDER — POLYETHYLENE GLYCOL 3350 17 G PO PACK
17.0000 g | PACK | Freq: Every day | ORAL | Status: DC
  Filled 2018-07-21 (×2): qty 1

## 2018-07-21 MED ORDER — INSULIN GLARGINE 100 UNIT/ML ~~LOC~~ SOLN
4.0000 [IU] | Freq: Once | SUBCUTANEOUS | Status: AC
  Administered 2018-07-21: 4 [IU] via SUBCUTANEOUS
  Filled 2018-07-21: qty 0.04

## 2018-07-21 MED ORDER — INSULIN GLARGINE 100 UNIT/ML ~~LOC~~ SOLN
24.0000 [IU] | Freq: Every day | SUBCUTANEOUS | Status: DC
  Administered 2018-07-22 – 2018-07-29 (×8): 24 [IU] via SUBCUTANEOUS
  Filled 2018-07-21 (×8): qty 0.24

## 2018-07-21 NOTE — Progress Notes (Signed)
[place] PHYSICAL MEDICINE & REHABILITATION PROGRESS NOTE  Subjective/Complaints: No new complaints. Feels better after large BM yesterday. Says dressing on back is sl uncomfortable  ROS: Patient denies fever, rash, sore throat, blurred vision, nausea, vomiting, diarrhea, cough, shortness of breath or chest pain,   headache, or mood change.   Objective: Vital Signs: Blood pressure 138/70, pulse 94, temperature 97.7 F (36.5 C), resp. rate 17, height 4\' 11"  (1.499 m), weight 73.2 kg, SpO2 98 %. No results found. No results for input(s): WBC, HGB, HCT, PLT in the last 72 hours. No results for input(s): NA, K, CL, CO2, GLUCOSE, BUN, CREATININE, CALCIUM in the last 72 hours.  Physical Exam: BP 138/70 (BP Location: Left Arm)   Pulse 94   Temp 97.7 F (36.5 C)   Resp 17   Ht 4\' 11"  (1.499 m)   Wt 73.2 kg   SpO2 98%   BMI 32.59 kg/m     Constitutional: No distress . Vital signs reviewed. HEENT: EOMI, oral membranes moist Neck: supple Cardiovascular: RRR without murmur. No JVD    Respiratory: CTA Bilaterally without wheezes or rales. Normal effort    GI: BS +, non-tender, non-distended   Musculoskeletal: No edema or tenderness in extremities  Neurological: She is alert.  Makes eye contact with examiner.  Motor: Grossly 4-5/5 throughout, stable HOH  Skin: Foam dressing in place to right foot, honeycomb dressing back in place. Wound CDI Psychiatric: pleasant, smiling today  Assessment/Plan: 1. Functional deficits secondary to lumbar discitis/osteomyelitis with epidural abscess complicated by cardiac arrest status post decompressive lumbar laminectomy and posterior fixation which require 3+ hours per day of interdisciplinary therapy in a comprehensive inpatient rehab setting.  Physiatrist is providing close team supervision and 24 hour management of active medical problems listed below.  Physiatrist and rehab team continue to assess barriers to discharge/monitor patient progress  toward functional and medical goals  Care Tool:  Bathing    Body parts bathed by patient: Right arm, Left arm, Chest, Abdomen, Left upper leg, Right upper leg, Face   Body parts bathed by helper: Front perineal area, Buttocks, Right lower leg, Left lower leg     Bathing assist Assist Level: Moderate Assistance - Patient 50 - 74%     Upper Body Dressing/Undressing Upper body dressing   What is the patient wearing?: Orthosis, Pull over shirt Orthosis activity level: Performed by helper  Upper body assist Assist Level: Maximal Assistance - Patient 25 - 49%    Lower Body Dressing/Undressing Lower body dressing      What is the patient wearing?: Pants, Incontinence brief     Lower body assist Assist for lower body dressing: Moderate Assistance - Patient 50 - 74%     Toileting Toileting    Toileting assist Assist for toileting: Total Assistance - Patient < 25%     Transfers Chair/bed transfer  Transfers assist     Chair/bed transfer assist level: Minimal Assistance - Patient > 75%     Locomotion Ambulation   Ambulation assist      Assist level: Contact Guard/Touching assist Assistive device: Walker-rolling Max distance: 139ft   Walk 10 feet activity   Assist     Assist level: Contact Guard/Touching assist Assistive device: Walker-rolling   Walk 50 feet activity   Assist    Assist level: Contact Guard/Touching assist Assistive device: Walker-rolling    Walk 150 feet activity   Assist           Walk 10 feet on uneven  surface  activity   Assist Walk 10 feet on uneven surfaces activity did not occur: Safety/medical concerns         Wheelchair     Assist   Type of Wheelchair: Manual    Wheelchair assist level: Supervision/Verbal cueing Max wheelchair distance: 150    Wheelchair 50 feet with 2 turns activity    Assist        Assist Level: Supervision/Verbal cueing   Wheelchair 150 feet activity     Assist      Assist Level: Supervision/Verbal cueing      Medical Problem List and Plan: 1.  Decreased functional mobility lower extremity weakness secondary to MSSA lumbar spine discitis/osteomyelitis epidural abscess in the setting of bacteremia as well as right foot ulceration complicated by cardiac arrest.S/Pdecompressive lumbar laminectomy L-1-2 with hemi-facetectomy, posterior fixation T10-L3 07/11/2018 per Dr. Sherley Bounds. Back brace when out of bed applied in sitting position.  Cont CIR PT, OT 2.  Antithrombotics: -DVT/anticoagulation:  SCDs.  Initial vascular study to 12/08/2018 negative             -antiplatelet therapy: none 3. Pain Management: MS Contin 15 mg every 12 hours, Robaxin and hydrocodone as needed.Monitor mental status secondary to narcotics  Controlled at present 3/2, improved in fact 4. Mood: Provide emotional support             -antipsychotic agents: none 5. Neuropsych: This patient is capable of making decisions on her own behalf. 6. Skin/Wound Care:  Foam dressing to right foot change every 3 days as needed..Routine skin checks 7. Fluids/Electrolytes/Nutrition:  Routine in and out's with follow-up 8. ID/MSSA bacteremia/epidural abscess.IV Ancef 2 g every 8 hours,rifampin 300 mg every 12 hours initiated 07/11/2018 8 weeks. Follow-up infectious disease 9. Acute on chronic anemia. Patient has been transfused 2 units pack red blood cells 07/12/2018. Continue iron supplement  Hemoglobin 9.6 on 2/27  Labs ordered for Monday are pending.  Continue to monitor 10. Essential hypertension. Lopressor 25 mg twice a day,Lasix 40 mg daily, lisinopril 10 mg daily.   Relatively controlled on 3/2  Monitor with increased mobility 11. Diabetes mellitus with peripheral neuropathy. Hemoglobin A1c 8.7. Lantus insulin 20 units daily. Check blood sugars before meals and at bedtime.    Labile on 2/28, monitor for trend  Titrate lantus to 24 units 12. Urinary retention. Urecholine 10 mg 3  times a day.   Seems to be emptying better now  Allergic to Sulfa   recheck pvr 13. B12 deficiency. Continue vitamin B-12 14.  Hyponatremia  Sodium 129 on 2/27  Labs ordered for Monday are pending  Continue to monitor 15.  Hypoalbuminemia  Supplement initiated on 2/27 16.  Constipation- opioids and immobility as risk factors  Bowel meds increased on 2/27  Large BM 3/1, another BM today  -will back of regimen slightly  LOS: 5 days A FACE TO FACE EVALUATION WAS PERFORMED  Meredith Staggers 07/21/2018, 8:50 AM

## 2018-07-21 NOTE — Progress Notes (Signed)
Physical Therapy Session Note  Patient Details  Name: Kaitlin Vargas MRN: [id number] Date of Birth: [birth year]  Today's Date: 07/21/2018 PT Individual Time: 1500-1557 PT Individual Time Calculation (min): 57 min   Short Term Goals: Week 1:  PT Short Term Goal 1 (Week 1): pt will perform functional transfers with min A PT Short Term Goal 2 (Week 1): pt will perform gait x 100' with min A in controlled environment PT Short Term Goal 3 (Week 1): pt will adhere to back precautions during mobility and locomotion with min cues, wiht TLSO  Skilled Therapeutic Interventions/Progress Updates:     Patient in w/c upon PT arrival. Patient alert and agreeable to PT session.  Therapeutic Activity: Bed Mobility: Patient performed supine to/from sit in a regular bed with min A for LE management and trunk support. Provided demonstration and verbal cues for maintaining spinal precautions and technique. Provided patient with a handout for measuring the height of her bed, sofa/chair, and car to determine need for a step stool for performing transfers to the bed and/or car for improved performance with these activities.  Transfers: Patient performed sit to/from stand x5 with RW. Provided verbal cues for maintaining upright posture to prevent breaking spinal precautions.  Gait Training:  Patient ambulated 150', 50', and 250' using RW with CGA for safety. Ambulated with decreased gait speed, decreased step length, downward head gaze, and forward trunk lean. Provided verbal cues for upright posture and looking ahead to maintain spinal precautions.  Therapeutic Exercise: Patient performed the following exercises with verbal and tactile cues for correct technique.  -B SAQ x10 with 2lb weight  -B standing hamstring curls x10 -L standing hip abduction. Required heavy cueing for upright posture and preventing increased toe out on L. Was unable to maintain balance on when performing exercise on the R with B UE support.  Ceased exercise after 3 failed attempts.   Patient in bed at end of session with breaks locked, bed alarm set, and all needs within reach.   Assessment: Patient continues to demonstrate decreased endurance, decreased strength, and decreased balance. Will benefit from continued high intensity skilled PT to address these deficits.   Plan: Continue with functional mobility training and transfers on household surfaces.  Therapy Documentation Precautions:  Precautions Precautions: Fall, Back Precaution Comments: reviewed with patient, cueing to adhere to during tasks  Required Braces or Orthoses: Spinal Brace Spinal Brace: Applied in sitting position Restrictions Weight Bearing Restrictions: No Other Position/Activity Restrictions: (RN reports she plans to follow up with [employer] ) Pain: Pain Assessment Pain Scale: 0-10 Pain Score: 4  Pain Type: Acute pain Pain Location: Back Pain Orientation: Lower Pain Descriptors / Indicators: Aching Pain Intervention(s): Repositioned;Ambulation/increased activity    Therapy/Group: Individual Therapy  Doreene Burke, PT, DPT 07/21/2018, 4:32 PM

## 2018-07-21 NOTE — Progress Notes (Signed)
Physical Therapy Session Note  Patient Details  Name: Kaitlin Vargas MRN: [id number] Date of Birth: [birth year]  Today's Date: 07/21/2018 PT Individual Time: 0945-1030 PT Individual Time Calculation (min): 45 min   Short Term Goals: Week 1:  PT Short Term Goal 1 (Week 1): pt will perform functional transfers with min A PT Short Term Goal 2 (Week 1): pt will perform gait x 100' with min A in controlled environment PT Short Term Goal 3 (Week 1): pt will adhere to back precautions during mobility and locomotion with min cues, wiht TLSO  Skilled Therapeutic Interventions/Progress Updates:    Pt received seated in w/c in room, agreeable to PT session. No complaints of pain. Manual w/c propulsion x 150 ft with use of BUE for global endurance. Sit to stand with min A to RW from w/c height. Ambulation 2 x 180 ft with RW and CGA, v/c for upright posture. Pt exhibits improved endurance for gait this date. Sit to stand 2 x 5 reps from mat table to RW with CGA; 2 x 5 reps from w/c to RW with min A. Focus on safety and hand placement during transfer. Toilet transfer with min A with use of grab bar and RW. Pt is max A for clothing management. Pt left seated in w/c in room with needs in reach and quick release belt in place at end of session.  Therapy Documentation Precautions:  Precautions Precautions: Fall, Back Precaution Comments: reviewed with patient, cueing to adhere to during tasks  Required Braces or Orthoses: Spinal Brace Spinal Brace: Applied in sitting position Restrictions Weight Bearing Restrictions: No Other Position/Activity Restrictions: (RN reports she plans to follow up with [employer] )    Therapy/Group: Individual Therapy  Excell Seltzer, PT, DPT  07/21/2018, 12:13 PM

## 2018-07-21 NOTE — Progress Notes (Signed)
Occupational Therapy Session Note  Patient Details  Name: Kaitlin Vargas MRN: [id number] Date of Birth: 1950/09/14  Today's Date: 07/21/2018  Session 1 OT Individual Time: 9437-0052 OT Individual Time Calculation (min): 45 min   Session 2 OT Individual Time: 1115-1200 OT Individual Time Calculation (min): 45 min    Short Term Goals: Week 1:  OT Short Term Goal 1 (Week 1): Pt will tolerate standing for 2 mins with min A to increase endurance for BADL tasks OT Short Term Goal 2 (Week 1): Pt will complete 1 step of LB dressing task OT Short Term Goal 3 (Week 1): Pt will complete stand-piovt toilet transfer with min A OT Short Term Goal 4 (Week 1): Pt will complete sit<>stand with min A  Skilled Therapeutic Interventions/Progress Updates:  Session 1   Pt greeted semi-reclined in bed and agreeable to OT treatment session. Pt completed log roll, to side-lying, to sitting EOB with HOB elevated and supervision. Min A to don TLSO seated EOB. CGA sit<>stand, then CGA to ambulate into bathroom. Pt able to manage clothing with CGA. Pt voided bladder with smear of BM. Pt able to complete peri-care with min A, then ambulated back out of bathroom. Worked on sit<>stand and standing endurance to stand while brushing teeth today. CGA for balance and tolerated 2 mins standing. LB dressing completed with min A to thread LLE into pants, but pt able to advance pants over hips today.  Pt with much improved pain management and motivated in therapy. Pt left seated in wc at end of session with alarm belt on and call bell in reach.   Session 2 Pt greeted seated in wc and agreeable to OT treatment session. Pt ambulated to the bathroom w. RW and CGA. Pt able to manage clothing and complete peri-care w/ min A-+void of bladder. B UE strengthening with wc propulsion to therapy gym. Worked on standing balance/endurance while participating in card matching activity. Pt had to reach in walker bag to place correct  cards-CGA/supervision for balance- 4 mins standing at each bout. Pt then ambulated 150 ft w/ RW and CGA. Pt returned to room and left seated in wc with alarm belt on and needs met.   Therapy Documentation Precautions:  Precautions Precautions: Fall, Back Precaution Comments: reviewed with patient, cueing to adhere to during tasks  Required Braces or Orthoses: Spinal Brace Spinal Brace: Applied in sitting position Restrictions Weight Bearing Restrictions: No Other Position/Activity Restrictions: (RN reports she plans to follow up with [employer] ) Pain: Pain Assessment Pain Scale: 0-10 Pain Score: 5  Pain Type: Acute pain Pain Location: Back Pain Orientation: Lower Pain Descriptors / Indicators: Aching Pain Onset: On-going Pain Intervention(s): Heat applied;Repositioned   Therapy/Group: Individual Therapy  Valma Cava 07/21/2018, 12:32 PM

## 2018-07-22 ENCOUNTER — Inpatient Hospital Stay (HOSPITAL_COMMUNITY): Payer: Medicare HMO

## 2018-07-22 ENCOUNTER — Inpatient Hospital Stay (HOSPITAL_COMMUNITY): Payer: Medicare HMO | Admitting: Physical Therapy

## 2018-07-22 ENCOUNTER — Inpatient Hospital Stay (HOSPITAL_COMMUNITY): Payer: Medicare HMO | Admitting: Occupational Therapy

## 2018-07-22 LAB — GLUCOSE, CAPILLARY
Glucose-Capillary: 171 mg/dL — ABNORMAL HIGH (ref 70–99)
Glucose-Capillary: 185 mg/dL — ABNORMAL HIGH (ref 70–99)
Glucose-Capillary: 189 mg/dL — ABNORMAL HIGH (ref 70–99)
Glucose-Capillary: 82 mg/dL (ref 70–99)

## 2018-07-22 NOTE — Patient Care Conference (Signed)
Inpatient RehabilitationTeam Conference and Plan of Care Update Date: 07/22/2018   Time: 2:50 PM    Patient Name: Kaitlin Vargas      Medical Record Number: [id number]  Date of Birth: 1951-03-15 Sex: Female         Room/Bed: 4W07C/4W07C-01 Payor Info: Payor: [employer] MEDICARE / Plan: [place] HMO / Product Type: *No Product type* /    Admitting Diagnosis: Back surgery  Admit Date/Time:  07/16/2018  5:29 PM Admission Comments: No comment available   Primary Diagnosis:  <principal problem not specified> Principal Problem: <principal problem not specified>  Patient Active Problem List   Diagnosis Date Noted  . Drug induced constipation   . Hypoalbuminemia due to protein-calorie malnutrition ([place])   . Diabetes mellitus type 2 in obese ([place])   . Vitamin B12 deficiency   . Urinary retention   . Diabetic peripheral neuropathy ([place])   . Postoperative pain   . 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 S/P lumbar fusion 07/11/2018  . Epidural abscess   . Sinus tachycardia   . Hyponatremia   . Labile blood glucose   . Acute on chronic anemia   . Bacteremia   . Lumbar discitis 06/26/2018  . Cardiac arrest, cause unspecified ([place])   . Acute encephalopathy   . MSSA bacteremia 06/17/2018  . 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 DKA (diabetic ketoacidosis) ([place]) 06/16/2018  . Diabetic foot ulcer ([place]) 06/16/2018  . Acute hypoxemic respiratory failure ([place]) 06/07/2018  . Essential hypertension 06/07/2018  . Type 2 diabetes mellitus with hyperlipidemia ([place]) 06/07/2018  . Anemia 06/07/2018  . Acute bronchitis 06/07/2018    Expected Discharge Date: Expected Discharge Date: 07/29/18  Team Members Present: Physician leading conference: Dr. Alger Simons Social Worker Present: Lennart Pall, LCSW Nurse Present: Other (comment)(Nate Gretta Cool, RN) PT Present: Roderic Ovens, PT OT Present: Cherylynn Ridges, OT SLP Present: Weston Anna, SLP PPS Coordinator present : Gunnar Fusi     Current  Status/Progress Goal Weekly Team Focus  Medical   pt s/p exploration and fusion of spine, pain seems to be improved.   maximize mobility, pain control  wound care, diabetes mgt, ID considerations   Bowel/Bladder   Continent of bowel and bladder. LBM 3/2  Continue to be continent of bowel and bladder.  timed toileting Q2hrs while awake   Swallow/Nutrition/ Hydration             ADL's   Min/mod A LB ADLs, min/CGA ambulation and tansfers  supervision  standing balance, activity tolerance, back precations, modified bathing/dressing   Mobility   min A sit <> stand, min A balance and gait  supervision overall  family ed, d/c planning, activity tolerance, strength   Communication             Safety/Cognition/ Behavioral Observations            Pain   C/o pain to lower back and BLE. Pain managed with PRN narco and Robaxin  pain less an 2  assess pain and management qshift and PRN.   Skin   Diabetic foot ulcer to right foot. foam dressing Q3days  Maintain skin integrity with mod assist   Assess skin qshift and PRN    Rehab Goals Patient on target to meet rehab goals: Yes *See Care Plan and progress notes for long and short-term goals.     Barriers to Discharge  Current Status/Progress Possible Resolutions Date Resolved   Physician    Medical stability  pain  improving pain control      Nursing                  PT                    OT                  SLP                SW                Discharge Planning/Teaching Needs:  Home with spouse and family who can provide 24/7 support.    Education to be completed still.   Team Discussion:  Much better overall on CIR return.  Pain still an issue.  Supervision to min assist with tfs and amb.  Need to keep working on stairs.  Mod assist ADLs and following back precautions.  Revisions to Treatment Plan:  NA    Continued Need for Acute Rehabilitation Level of Care: The patient requires daily medical management by a physician  with specialized training in physical medicine and rehabilitation for the following conditions: Daily direction of a multidisciplinary physical rehabilitation program to ensure safe treatment while eliciting the highest outcome that is of practical value to the patient.: Yes Daily medical management of patient stability for increased activity during participation in an intensive rehabilitation regime.: Yes Daily analysis of laboratory values and/or radiology reports with any subsequent need for medication adjustment of medical intervention for : Post surgical problems;Neurological problems;Urological problems   I attest that I was present, lead the team conference, and concur with the assessment and plan of the team.   Marnae Madani 07/22/2018, 4:28 PM

## 2018-07-22 NOTE — Progress Notes (Signed)
Occupational Therapy Session Note  Patient Details  Name: Kaitlin Vargas MRN: [id number] Date of Birth: [birth year]  Today's Date: 07/22/2018 OT Individual Time: 1515-1620 OT Individual Time Calculation (min): 65 min  10 min skilled OT missed d/t pt fatigue    Short Term Goals: Week 1:  OT Short Term Goal 1 (Week 1): Pt will tolerate standing for 2 mins with min A to increase endurance for BADL tasks OT Short Term Goal 2 (Week 1): Pt will complete 1 step of LB dressing task OT Short Term Goal 3 (Week 1): Pt will complete stand-piovt toilet transfer with min A OT Short Term Goal 4 (Week 1): Pt will complete sit<>stand with min A  Skilled Therapeutic Interventions/Progress Updates:    Pt completed tub transfer using TTB with demo provided. Min A to lift LLE over side of tub but good adherence to safety and to precautions throughout. Pt transferred out of tub with CGA. Several feet of functional mobility performed with CGA using RW. Pt then stood on non-compliant surface and completed unilateral table task with occasional bimanual use. Min A required for balance support when removing B UE from RW. Pt was returned to room d/t report of urgent BM. Pt completed SPT to toilet and required mod A for clothing management. Pt had incontinent BM in brief and finished remainder on commode. Pt required mod A for peri hygiene, requiring several seated rest breaks. Following this episode, pt reported fatigue and asked to end session 10 min early. Pt was returned supine and left with all needs met, bed alarm set.   Therapy Documentation Precautions:  Precautions Precautions: Fall, Back Precaution Comments: reviewed with patient, cueing to adhere to during tasks  Required Braces or Orthoses: Spinal Brace Spinal Brace: Applied in sitting position Restrictions Weight Bearing Restrictions: No Other Position/Activity Restrictions: (RN reports she plans to follow up with [employer] ) Pain: Pain Assessment Pain  Scale: 0-10 Pain Score: 0-No pain   Therapy/Group: Individual Therapy  Curtis Sites 07/22/2018, 3:41 PM

## 2018-07-22 NOTE — Progress Notes (Signed)
[place] PHYSICAL MEDICINE & REHABILITATION PROGRESS NOTE  Subjective/Complaints: Complains of back pain and lower extremity swelling. Bemoaning the work she did in therapy yesterday  ROS: Patient denies fever, rash, sore throat, blurred vision, nausea, vomiting, diarrhea, cough, shortness of breath or chest pain,  headache, or mood change.   Objective: Vital Signs: Blood pressure 138/79, pulse 95, temperature 98 F (36.7 C), temperature source Oral, resp. rate 18, height 4\' 11"  (1.499 m), weight 73.2 kg, SpO2 98 %. No results found. Recent Labs    07/21/18 1820  WBC 6.4  HGB 9.9*  HCT 30.8*  PLT 326   Recent Labs    07/21/18 1820  NA 132*  K 3.7  CL 94*  CO2 29  GLUCOSE 154*  BUN 13  CREATININE 0.51  CALCIUM 8.5*    Physical Exam: BP 138/79 (BP Location: Left Arm)   Pulse 95   Temp 98 F (36.7 C) (Oral)   Resp 18   Ht 4\' 11"  (1.499 m)   Wt 73.2 kg   SpO2 98%   BMI 32.59 kg/m     Constitutional: No distress . Vital signs reviewed. HEENT: EOMI, oral membranes moist Neck: supple Cardiovascular: RRR without murmur. No JVD    Respiratory: CTA Bilaterally without wheezes or rales. Normal effort    GI: BS +, non-tender, non-distended   Musculoskeletal: No edema or tenderness in extremities  Neurological: She is alert.  Makes eye contact with examiner.  Motor: Grossly 4-5/5 throughout, stable HOH  Skin: Foam dressing in place to right foot, honeycomb dressing back in place. Wounds CDI Psychiatric: pleasant, smiling today Ext: 1+ LE edema   Assessment/Plan: 1. Functional deficits secondary to lumbar discitis/osteomyelitis with epidural abscess complicated by cardiac arrest status post decompressive lumbar laminectomy and posterior fixation which require 3+ hours per day of interdisciplinary therapy in a comprehensive inpatient rehab setting.  Physiatrist is providing close team supervision and 24 hour management of active medical problems listed  below.  Physiatrist and rehab team continue to assess barriers to discharge/monitor patient progress toward functional and medical goals  Care Tool:  Bathing    Body parts bathed by patient: Right arm, Left arm, Chest, Abdomen, Left upper leg, Right upper leg, Face   Body parts bathed by helper: Front perineal area, Buttocks, Right lower leg, Left lower leg     Bathing assist Assist Level: Moderate Assistance - Patient 50 - 74%     Upper Body Dressing/Undressing Upper body dressing   What is the patient wearing?: Orthosis, Pull over shirt Orthosis activity level: Performed by helper  Upper body assist Assist Level: Maximal Assistance - Patient 25 - 49%    Lower Body Dressing/Undressing Lower body dressing      What is the patient wearing?: Pants, Incontinence brief     Lower body assist Assist for lower body dressing: Moderate Assistance - Patient 50 - 74%     Toileting Toileting    Toileting assist Assist for toileting: Total Assistance - Patient < 25%     Transfers Chair/bed transfer  Transfers assist     Chair/bed transfer assist level: Minimal Assistance - Patient > 75%     Locomotion Ambulation   Ambulation assist      Assist level: Contact Guard/Touching assist Assistive device: Walker-rolling Max distance: 250   Walk 10 feet activity   Assist     Assist level: Contact Guard/Touching assist Assistive device: Walker-rolling   Walk 50 feet activity   Assist    Assist  level: Contact Guard/Touching assist Assistive device: Walker-rolling    Walk 150 feet activity   Assist Walk 150 feet activity did not occur: Safety/medical concerns  Assist level: Contact Guard/Touching assist Assistive device: Walker-rolling    Walk 10 feet on uneven surface  activity   Assist Walk 10 feet on uneven surfaces activity did not occur: Safety/medical concerns         Wheelchair     Assist Will patient use wheelchair at discharge?:  No Type of Wheelchair: Manual    Wheelchair assist level: Supervision/Verbal cueing Max wheelchair distance: 150    Wheelchair 50 feet with 2 turns activity    Assist        Assist Level: Supervision/Verbal cueing   Wheelchair 150 feet activity     Assist     Assist Level: Supervision/Verbal cueing      Medical Problem List and Plan: 1.  Decreased functional mobility lower extremity weakness secondary to MSSA lumbar spine discitis/osteomyelitis epidural abscess in the setting of bacteremia as well as right foot ulceration complicated by cardiac arrest.S/Pdecompressive lumbar laminectomy L-1-2 with hemi-facetectomy, posterior fixation T10-L3 07/11/2018 per Dr. Sherley Bounds. Back brace when out of bed applied in sitting position.  Cont CIR PT, OT 2.  Antithrombotics: -DVT/anticoagulation:  SCDs.  Initial vascular study to 12/08/2018 negative             -antiplatelet therapy: none 3. Pain Management: MS Contin 15 mg every 12 hours, Robaxin and hydrocodone as needed.Monitor mental status secondary to narcotics  -improved pain control 4. Mood: Provide emotional support             -antipsychotic agents: none 5. Neuropsych: This patient is capable of making decisions on her own behalf. 6. Skin/Wound Care:  Foam dressing to right foot change every 3 days as needed..Routine skin checks 7. Fluids/Electrolytes/Nutrition:  Routine in and out's with follow-up 8. ID/MSSA bacteremia/epidural abscess.IV Ancef 2 g every 8 hours,rifampin 300 mg every 12 hours initiated 07/11/2018 8 weeks. Follow-up infectious disease 9. Acute on chronic anemia. Patient has been transfused 2 units pack red blood cells 07/12/2018. Continue iron supplement  Hemoglobin 9.6 on 2/27---->9.9 3/3     Continue to monitor 10. Essential hypertension. Lopressor 25 mg twice a day,Lasix 40 mg daily, lisinopril 10 mg daily.   Relatively controlled on 3/3  Monitor with increased mobility 11. Diabetes mellitus  with peripheral neuropathy. Hemoglobin A1c 8.7. Lantus insulin 20 units daily. Check blood sugars before meals and at bedtime.       Titrated lantus to 24 units on 3/2---observe today 12. Urinary retention. Urecholine 10 mg 3 times a day.   Seems to be emptying better now  Allergic to Sulfa   recheck pvr---none done 13. B12 deficiency. Continue vitamin B-12 14.  Hyponatremia  Sodium 129 on 2/27---> up to 132 3/2 15.  Hypoalbuminemia  Supplement initiated on 2/27 16.  Constipation- opioids and immobility as risk factors  Bowel meds increased on 2/27  Multiple bm's  -reduced regimen sl 17. LE edema: TEDS, elevation for now  -on lasix as above  LOS: 6 days A FACE TO FACE EVALUATION WAS PERFORMED  Meredith Staggers 07/22/2018, 8:29 AM

## 2018-07-22 NOTE — Progress Notes (Signed)
Occupational Therapy Session Note  Patient Details  Name: Kaitlin Vargas MRN: [id number] Date of Birth: [birth year]  Today's Date: 07/22/2018 OT Individual Time: 1300-1330 OT Individual Time Calculation (min): 30 min    Short Term Goals: Week 1:  OT Short Term Goal 1 (Week 1): Pt will tolerate standing for 2 mins with min A to increase endurance for BADL tasks OT Short Term Goal 2 (Week 1): Pt will complete 1 step of LB dressing task OT Short Term Goal 3 (Week 1): Pt will complete stand-piovt toilet transfer with min A OT Short Term Goal 4 (Week 1): Pt will complete sit<>stand with min A  Skilled Therapeutic Interventions/Progress Updates:    Pt resting in w/c upon arrival and agreeable to therapy.  OT intervention with focus on sit<>stand, functional amb with RW, and use of recaher to retrieve items from floor and transport safety to room.  Pt completed all tasks with CGA and did not exhibit any unsafe behaviors.  Pt remained in w/c with all needs within reach and belt alarm activated.   Therapy Documentation Precautions:  Precautions Precautions: Fall, Back Precaution Comments: reviewed with patient, cueing to adhere to during tasks  Required Braces or Orthoses: Spinal Brace Spinal Brace: Applied in sitting position Restrictions Weight Bearing Restrictions: No Other Position/Activity Restrictions: (RN reports she plans to follow up with [employer] ) Pain:  Pt denies pain   Therapy/Group: Individual Therapy  Leroy Libman 07/22/2018, 1:47 PM

## 2018-07-22 NOTE — Progress Notes (Signed)
Occupational Therapy Session Note  Patient Details  Name: Kaitlin Vargas MRN: [id number] Date of Birth: 12-24-1950  Today's Date: 07/22/2018 OT Individual Time: 0730-0830 OT Individual Time Calculation (min): 60 min    Short Term Goals: Week 1:  OT Short Term Goal 1 (Week 1): Pt will tolerate standing for 2 mins with min A to increase endurance for BADL tasks OT Short Term Goal 2 (Week 1): Pt will complete 1 step of LB dressing task OT Short Term Goal 3 (Week 1): Pt will complete stand-piovt toilet transfer with min A OT Short Term Goal 4 (Week 1): Pt will complete sit<>stand with min A  Skilled Therapeutic Interventions/Progress Updates:    Pt greeted sitting in wc finishing breakfast and taking morning meds. Pt reports she has already been to the bathroom this am. Pt completed bathing at the sink. Pt able to wash upper body with supervision. OT provided pt with LH sponge and educated on adapted technique to increase access to LB. Pt with LE swelling, so OT applied TED hose with friction reducing bag. LB dressing with pt able to thread R LE from figure 4 position and min A to thread LLE. Pt reported urgent need to pee when standing to pull pants up, so OT brought BSC behind pt. Pt able to void bladder with small BM. Worked on toileting strategies, with pt able to clean the front, but unable to reach buttocks without twisting. Pt needed assistance 2.2 back precautions. Sit<>stands throughout session with min A. Pt left seated in wc with needs met and TLSO off for comfort.   Therapy Documentation Precautions:  Precautions Precautions: Fall, Back Precaution Comments: reviewed with patient, cueing to adhere to during tasks  Required Braces or Orthoses: Spinal Brace Spinal Brace: Applied in sitting position Restrictions Weight Bearing Restrictions: No Other Position/Activity Restrictions: (RN reports she plans to follow up with orthotech ) Pain: Pain Assessment Pain Scale: 0-10 Pain Score: 8   Pain Type: Acute pain Pain Location: Back Pain Orientation: Right;Left Pain Descriptors / Indicators: Aching Pain Onset: On-going Patients Stated Pain Goal: 3 Pain Intervention(s): Heat applied;Repositioned;Ambulation/increased activity  Therapy/Group: Individual Therapy  Valma Cava 07/22/2018, 8:23 AM

## 2018-07-22 NOTE — Progress Notes (Signed)
Physical Therapy Session Note  Patient Details  Name: Kaitlin Vargas MRN: [id number] Date of Birth: [birth year]  Today's Date: 07/22/2018 PT Individual Time: 1030-1112 PT Individual Time Calculation (min): 42 min   Short Term Goals: Week 1:  PT Short Term Goal 1 (Week 1): pt will perform functional transfers with min A PT Short Term Goal 2 (Week 1): pt will perform gait x 100' with min A in controlled environment PT Short Term Goal 3 (Week 1): pt will adhere to back precautions during mobility and locomotion with min cues, wiht TLSO  Skilled Therapeutic Interventions/Progress Updates:    pt performs gait 150', 100' x 2 throughout session with RW and close supervision.  CGA for sit <> Stand throughout session.  Attempt stair negotiation to 8'' steps, pt unable despite max A and bilat handrails.  4'' step ups with RW with mod A 3 x 3 bilat.  Step ups to 2'' step 2 x 10 bilat with supervision and RW.  Standing calf stretch on wedge due to pt c/o "soreness" in calves after yesterdays session.  Pt performs stretch 3 x 1 minute. Pt left in w/c with needs at hand, alarm set.  Therapy Documentation Precautions:  Precautions Precautions: Fall, Back Precaution Comments: reviewed with patient, cueing to adhere to during tasks  Required Braces or Orthoses: Spinal Brace Spinal Brace: Applied in sitting position Restrictions Weight Bearing Restrictions: No Other Position/Activity Restrictions: (RN reports she plans to follow up with orth[employer] ) Pain: Pt c/o 5/10 pain in back, heat applied after session   Therapy/Group: Individual Therapy  Torry Istre 07/22/2018, 11:13 AM

## 2018-07-23 ENCOUNTER — Inpatient Hospital Stay (HOSPITAL_COMMUNITY): Payer: Medicare HMO | Admitting: Occupational Therapy

## 2018-07-23 ENCOUNTER — Inpatient Hospital Stay (HOSPITAL_COMMUNITY): Payer: Medicare HMO

## 2018-07-23 ENCOUNTER — Encounter (HOSPITAL_COMMUNITY): Payer: Medicare HMO | Admitting: Psychology

## 2018-07-23 ENCOUNTER — Inpatient Hospital Stay (HOSPITAL_COMMUNITY): Payer: Medicare HMO | Admitting: Physical Therapy

## 2018-07-23 LAB — COMPREHENSIVE METABOLIC PANEL
ALT: 6 U/L (ref 0–44)
AST: 14 U/L — ABNORMAL LOW (ref 15–41)
Albumin: 2.1 g/dL — ABNORMAL LOW (ref 3.5–5.0)
Alkaline Phosphatase: 93 U/L (ref 38–126)
Anion gap: 7 (ref 5–15)
BUN: 13 mg/dL (ref 8–23)
CO2: 31 mmol/L (ref 22–32)
CREATININE: 0.61 mg/dL (ref 0.44–1.00)
Calcium: 8.4 mg/dL — ABNORMAL LOW (ref 8.9–10.3)
Chloride: 97 mmol/L — ABNORMAL LOW (ref 98–111)
GFR calc non Af Amer: >60 mL/min (ref >60)
Glucose, Bld: 176 mg/dL — ABNORMAL HIGH (ref 70–99)
Potassium: 3.8 mmol/L (ref 3.5–5.1)
Sodium: 135 mmol/L (ref 135–145)
Total Bilirubin: 0.4 mg/dL (ref 0.3–1.2)
Total Protein: 5.4 g/dL — ABNORMAL LOW (ref 6.5–8.1)

## 2018-07-23 LAB — GLUCOSE, CAPILLARY
GLUCOSE-CAPILLARY: 200 mg/dL — AB (ref 70–99)
Glucose-Capillary: 161 mg/dL — ABNORMAL HIGH (ref 70–99)
Glucose-Capillary: 273 mg/dL — ABNORMAL HIGH (ref 70–99)
Glucose-Capillary: 151 mg/dL — ABNORMAL HIGH (ref 70–99)

## 2018-07-23 MED ORDER — MORPHINE SULFATE ER 15 MG PO TBCR
15.0000 mg | EXTENDED_RELEASE_TABLET | Freq: Every day | ORAL | Status: DC
  Administered 2018-07-24 – 2018-07-25 (×2): 15 mg via ORAL
  Filled 2018-07-23 (×2): qty 1

## 2018-07-23 NOTE — Consult Note (Signed)
Neuropsychological Consultation   Patient:   Kaitlin Vargas   DOB:   1950-11-23  MR Number:  [id number]  Location:  [place] [id number] [place] [address] Dept: [place]: [phone]           Date of Service:   07/21/2018  Start Time:   3 PM End Time:   4 PM  Provider/Observer:  Ilean Skill, Psy.D.       Clinical Neuropsychologist       Billing Code/Service: [phone]  Chief Complaint:    Clancy Leiner is a 68 year old female with history of hypertension, hyperlipidemia, B12 deficiency and diabetes.  Patient had with x4 week history of back pain with recent hospital course with dx adenovirus in mid-January and received antibiotic coverage with recovery and discharge home.  Patient continued back pain radiating to the lower extremities with urinary incontinence and stool incontinence.  Presented to [facility] on 06/16/2018 with increased back pain and lower extremity weakness.  CT scan suggested ostemyelitis and transferred to [facility] for IR completed lumbar puncture.  During procedure increase active bleeding and cardiac arrest requiring CPR x90 seconds.  Felt due to respiratory arrest following sedation.  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  Also prevertebral abscess.  ID and neurosurgy consulted with decision to explore abscess due to patient not responding to antibiotic.  Patient had decompressive lumbar laminectomy L1-2 with hemi-facetectomy and sublaminar decompression and discectomy.  Posterior fixation etc.  After steady progress patient admitted to CIR program.    Reason for Service:  Patient referred for neuropsychological consultation due to concerns about coping and adjustment issues.  Below is the full HPI for the current admission.    HPI: Kaitlin Vargas a 68 year old right-handed female with history of hypertension, hyperlipidemia, B12 deficiency  and diabetes mellitus. Patient with reported back pain 4 weeks. Per chart review and patient, patient lives with spouse. One level home with ramped entrance. Independent with assistive device prior to admission. She recently came to the hospital with cough diagnosed with adenovirus in mid-January received antibiotic coverage recovering nicely was discharged to home. She continued to have back pain radiating to the lower extremities as well as bouts of urinary incontinence and stool incontinence. She presented to [facility] on 06/16/2018 with increasing back pain with lower extremity weakness and a CT scan of the spine showed what appeared to be osteomyelitis. She was transferred to [facility] for interventional radiology completed lumbar puncture and during the procedure noted increase active bleeding as well as cardiac arrest requiring CPR 90 seconds. She did not require intubation. It was felt cardiac arrest likely due to respiratory arrest following sedation. 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 Patient also with right foot ulceration. MRI of the foot negative for acute bony or joint abnormality. Small volume of fluid in the subcutaneous tissue deep to the first TMT joint. Infectious disease consulted patient with GPCs growing from lumbar spine aspirate. Placed on antibiotic therapy. TEE negative for vegetation. She had been placed on Cefazolin per infectious disease through 08/13/2018. Subcutaneous heparin for DVT prophylaxis. Hospital course of pain management maintained on long-acting MS Contin. Acute on chronic anemia 7.6 transfused 2 units of packed red blood cells 07/12/2018 and monitored closely initial bouts of hyponatremia 130. Therapy evaluations completed and patient was admitted for a  comprehensive rehabilitation program to 06/26/2018. Patient was progressing nicely and developed  increasing back pain 07/09/2018. 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 Also showed prevertebral abscess. Infectious disease as well as neurosurgery consulted and plan was to discharge to acute care services for ongoing care 07/10/2018. After long discussion between neurosurgery and infectious disease it was decided to explore abscess as patient appeared not to be responding to antibiotics and underwent decompressive lumbar laminectomy lumbar 1-2 left with hemi-facetectomy and sublaminar decompression and discectomy. Disc was sent for biopsy and culture. Posterior fixation T10-L3 using cortical pedicle screws. Intertransverse arthrodesis T10-L3 with bone marrow aspirate obtained through a separate fascial incision over the left iliac crest 07/11/2018 per Dr. Sherley Bounds. Back brace when out of bed applied in sitting position.Pain management with the use of MS Contin monitoring mental status for confusion. Infectious disease follow-up current plan is for 8 weeks of IV cefazolin plus oral rifampin 300 mg twice a day from date of surgery 07/11/2018. TEE completed 07/15/2018 to rule out endocarditis showed a small oscillating density on the aortic side of aortic valve concerning for vegetation.  No further intervention and advised to continue antibiotic therapy as directed.follow-up cardiothoracic surgery in regards to aortic valve endocarditis no plan for surgical intervention. Patient with bouts of urinary retention currently maintained on Urecholine. Therapy evaluations resumed with steady progress. Patient is admitted to inpatient rehabilitation services to continue comprehensive therapies.  Current Status:  The patient reports that her mood has really improved with knowing that she is improving and discharge planned for Saturday.  The patient reports that she did have some fear and anxiety while she was going thru figuring what was going on  and surgeries.  Patient reports that she has lots of family support once discharge.    Behavioral Observation: Merl Guardino  presents as a 68 y.o.-year-old Right Caucasian Female who appeared her stated age. her dress was Appropriate and she was Well Groomed and her manners were Appropriate to the situation.  her participation was indicative of Appropriate and Attentive behaviors.  There were any physical disabilities noted.  she displayed an appropriate level of cooperation and motivation.     Interactions:    Active Appropriate and Attentive  Attention:   within normal limits and attention span and concentration were age appropriate  Memory:   within normal limits; recent and remote memory intact  Visuo-spatial:  not examined  Speech (Volume):  normal  Speech:   normal; normal  Thought Process:  Coherent and Relevant  Though Content:  WNL; not suicidal and not homicidal  Orientation:   person, place, time/date and situation  Judgment:   Good  Planning:   Fair  Affect:    Appropriate  Mood:    Euthymic  Insight:   Good  Intelligence:   normal  Medical History:   Past Medical History:  Diagnosis Date  . Anemia   . Diabetes mellitus without complication ([place])   . Hyperlipidemia   . Hypertension   . Peripheral neuropathy     Psychiatric History:  Patient denies past psychiatric history.  Family Med/Psych History:  Family History  Problem Relation Age of Onset  . Cancer Mother   . Stroke Mother   . Cancer Father   . Leukemia Father     Risk of Suicide/Violence: virtually non-existent Patient denies SI or HI  Impression/DX:  Brihanna Devenport is a 68 year old female with history of hypertension, hyperlipidemia,  B12 deficiency and diabetes.  Patient had with x4 week history of back pain with recent hospital course with dx adenovirus in mid-January and received antibiotic coverage with recovery and discharge home.  Patient continued back pain radiating to the lower extremities  with urinary incontinence and stool incontinence.  Presented to [facility] on 06/16/2018 with increased back pain and lower extremity weakness.  CT scan suggested ostemyelitis and transferred to [facility] for IR completed lumbar puncture.  During procedure increase active bleeding and cardiac arrest requiring CPR x90 seconds.  Felt due to respiratory arrest following sedation.  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  Also prevertebral abscess.  ID and neurosurgy consulted with decision to explore abscess due to patient not responding to antibiotic.  Patient had decompressive lumbar laminectomy L1-2 with hemi-facetectomy and sublaminar decompression and discectomy.  Posterior fixation etc.  After steady progress patient admitted to [facility] program.    The patient reports that her mood has really improved with knowing that she is improving and discharge planned for Saturday.  The patient reports that she did have some fear and anxiety while she was going thru figuring what was going on and surgeries.  Patient reports that she has lots of family support once discharge.             Electronically Signed   _______________________ Ilean Skill, Psy.D.

## 2018-07-23 NOTE — Progress Notes (Signed)
Occupational Therapy Session Note  Patient Details  Name: Kaitlin Vargas MRN: [id number] Date of Birth: 26-Feb-1951  Today's Date: 07/23/2018 OT Individual Time: 0700-0810 OT Individual Time Calculation (min): 70 min    Short Term Goals: Week 1:  OT Short Term Goal 1 (Week 1): Pt will tolerate standing for 2 mins with min A to increase endurance for BADL tasks OT Short Term Goal 2 (Week 1): Pt will complete 1 step of LB dressing task OT Short Term Goal 3 (Week 1): Pt will complete stand-piovt toilet transfer with min A OT Short Term Goal 4 (Week 1): Pt will complete sit<>stand with min A  Skilled Therapeutic Interventions/Progress Updates:    1:1. Pt reporting 3/10 back pain and declines medicine intervention. Pt completes donning TLSO with min VC. Pt completes ambualtory transfer with RW with CGA an A to manage IV pole to transfer. Pt requires A for hygiene after small BM and bladder void. Pt grooms at sink with set up. Pt washes/dresses UB with set up for shirt and VC for brace application. Pt able to assume seated figure 4 today(!) to wash and thread BLE into pants/don socks and shoes. Pt very pleased and surprised with ability to achieve this position. Educated pt on sitting in figure 4 to increase hip stretch and LB release for improved ease of assuming position and decreasing low back pain. Pt unable to advance pants past hips.  Therapy Documentation Precautions:  Precautions Precautions: Fall, Back Precaution Comments: reviewed with patient, cueing to adhere to during tasks  Required Braces or Orthoses: Spinal Brace Spinal Brace: Applied in sitting position Restrictions Weight Bearing Restrictions: No Other Position/Activity Restrictions: (RN reports she plans to follow up with orthotech )   Therapy/Group: Individual Therapy  Tonny Branch 07/23/2018, 7:09 AM

## 2018-07-23 NOTE — Progress Notes (Signed)
[place] PHYSICAL MEDICINE & REHABILITATION PROGRESS NOTE  Subjective/Complaints: In good spirits. At sink with OT. Pain is better. Trying to go without any short acting pain medicine this morning.   ROS: Patient denies fever, rash, sore throat, blurred vision, nausea, vomiting, diarrhea, cough, shortness of breath or chest pain, joint or back pain, headache, or mood change.    Objective: Vital Signs: Blood pressure (!) 124/53, pulse 97, temperature 98.2 F (36.8 C), temperature source Oral, resp. rate 18, height 4\' 11"  (1.499 m), weight 73.2 kg, SpO2 96 %. No results found. Recent Labs    07/21/18 1820  WBC 6.4  HGB 9.9*  HCT 30.8*  PLT 326   Recent Labs    07/21/18 1820 07/23/18 0451  NA 132* 135  K 3.7 3.8  CL 94* 97*  CO2 29 31  GLUCOSE 154* 176*  BUN 13 13  CREATININE 0.51 0.61  CALCIUM 8.5* 8.4*    Physical Exam: BP (!) 124/53 (BP Location: Left Arm)   Pulse 97   Temp 98.2 F (36.8 C) (Oral)   Resp 18   Ht 4\' 11"  (1.499 m)   Wt 73.2 kg   SpO2 96%   BMI 32.59 kg/m     Constitutional: No distress . Vital signs reviewed. HEENT: EOMI, oral membranes moist Neck: supple Cardiovascular: RRR without murmur. No JVD    Respiratory: CTA Bilaterally without wheezes or rales. Normal effort    GI: BS +, non-tender, non-distended   Musculoskeletal: No edema or tenderness in extremities  Neurological: She is alert. No CN findings    Motor: Grossly 4-5/5 throughout, stable HOH  Skin: Foam dressing in place to right foot, honeycomb dressing back in place. Wounds CDI Psychiatric:pleasant affect Ext: tr LE edema   Assessment/Plan: 1. Functional deficits secondary to lumbar discitis/osteomyelitis with epidural abscess complicated by cardiac arrest status post decompressive lumbar laminectomy and posterior fixation which require 3+ hours per day of interdisciplinary therapy in a comprehensive inpatient rehab setting.  Physiatrist is providing close team  supervision and 24 hour management of active medical problems listed below.  Physiatrist and rehab team continue to assess barriers to discharge/monitor patient progress toward functional and medical goals  Care Tool:  Bathing    Body parts bathed by patient: Right arm, Left arm, Chest, Abdomen, Left upper leg, Right upper leg, Face, Front perineal area, Right lower leg, Left lower leg   Body parts bathed by helper: Buttocks     Bathing assist Assist Level: Minimal Assistance - Patient > 75%     Upper Body Dressing/Undressing Upper body dressing   What is the patient wearing?: Orthosis, Pull over shirt Orthosis activity level: Performed by helper  Upper body assist Assist Level: Minimal Assistance - Patient > 75%    Lower Body Dressing/Undressing Lower body dressing      What is the patient wearing?: Pants, Incontinence brief     Lower body assist Assist for lower body dressing: Minimal Assistance - Patient > 75%     Toileting Toileting    Toileting assist Assist for toileting: Maximal Assistance - Patient 25 - 49%     Transfers Chair/bed transfer  Transfers assist     Chair/bed transfer assist level: Minimal Assistance - Patient > 75%     Locomotion Ambulation   Ambulation assist      Assist level: Contact Guard/Touching assist Assistive device: Walker-rolling Max distance: 250   Walk 10 feet activity   Assist     Assist level: Contact Guard/Touching  assist Assistive device: Walker-rolling   Walk 50 feet activity   Assist    Assist level: Contact Guard/Touching assist Assistive device: Walker-rolling    Walk 150 feet activity   Assist Walk 150 feet activity did not occur: Safety/medical concerns  Assist level: Contact Guard/Touching assist Assistive device: Walker-rolling    Walk 10 feet on uneven surface  activity   Assist Walk 10 feet on uneven surfaces activity did not occur: Safety/medical concerns          Wheelchair     Assist Will patient use wheelchair at discharge?: No Type of Wheelchair: Manual    Wheelchair assist level: Supervision/Verbal cueing Max wheelchair distance: 150    Wheelchair 50 feet with 2 turns activity    Assist        Assist Level: Supervision/Verbal cueing   Wheelchair 150 feet activity     Assist     Assist Level: Supervision/Verbal cueing      Medical Problem List and Plan: 1.  Decreased functional mobility lower extremity weakness secondary to MSSA lumbar spine discitis/osteomyelitis epidural abscess in the setting of bacteremia as well as right foot ulceration complicated by cardiac arrest.S/Pdecompressive lumbar laminectomy L-1-2 with hemi-facetectomy, posterior fixation T10-L3 07/11/2018 per Dr. Sherley Bounds. Back brace when out of bed applied in sitting position.  Cont CIR PT, OT  -making nice progress toward goals.  2.  Antithrombotics: -DVT/anticoagulation:  SCDs.  Initial vascular study to 12/08/2018 negative             -antiplatelet therapy: none 3. Pain Management: MS Contin 15 mg every 12 hours, Robaxin and hydrocodone as needed.Monitor mental status secondary to narcotics  -improved pain control, will reduce ms contin to daily---pt is in agreement 4. Mood: Provide emotional support             -antipsychotic agents: none 5. Neuropsych: This patient is capable of making decisions on her own behalf. 6. Skin/Wound Care:  Foam dressing to right foot change every 3 days as needed..Routine skin checks 7. Fluids/Electrolytes/Nutrition:  Routine in and out's with follow-up 8. ID/MSSA bacteremia/epidural abscess.IV Ancef 2 g every 8 hours,rifampin 300 mg every 12 hours initiated 07/11/2018 8 weeks. Follow-up infectious disease 9. Acute on chronic anemia. Patient has been transfused 2 units pack red blood cells 07/12/2018. Continue iron supplement  Hemoglobin 9.6 on 2/27---->9.9 3/3     Continue to monitor 10. Essential  hypertension. Lopressor 25 mg twice a day,Lasix 40 mg daily, lisinopril 10 mg daily.   Relatively controlled on 3/4  Monitor with increased mobility 11. Diabetes mellitus with peripheral neuropathy. Hemoglobin A1c 8.7. Lantus insulin 20 units daily. Check blood sugars before meals and at bedtime.       Titrated lantus to 24 units on 3/2---observe today 12. Urinary retention. Urecholine 10 mg 3 times a day.   Seems to be emptying better now  Allergic to Sulfa   recheck pvr---none done 13. B12 deficiency. Continue vitamin B-12 14.  Hyponatremia  Sodium 129 on 2/27---> up to 132 3/2 15.  Hypoalbuminemia  Supplement initiated on 2/27 16.  Constipation- opioids and immobility as risk factors   now having frequent bms  -reduced regimen again today 17. LE edema: TEDS, elevation for now  -on lasix as above  LOS: 7 days A FACE TO FACE EVALUATION WAS PERFORMED  Meredith Staggers 07/23/2018, 8:53 AM

## 2018-07-23 NOTE — Progress Notes (Signed)
Occupational Therapy Session Note  Patient Details  Name: Kaitlin Vargas MRN: [id number] Date of Birth: September 04, 1950  Today's Date: 07/23/2018 OT Individual Time: 1400-1500 OT Individual Time Calculation (min): 60 min    Short Term Goals: Week 1:  OT Short Term Goal 1 (Week 1): Pt will tolerate standing for 2 mins with min A to increase endurance for BADL tasks OT Short Term Goal 2 (Week 1): Pt will complete 1 step of LB dressing task OT Short Term Goal 3 (Week 1): Pt will complete stand-piovt toilet transfer with min A OT Short Term Goal 4 (Week 1): Pt will complete sit<>stand with min A  Skilled Therapeutic Interventions/Progress Updates:    Patient seated in w/c and ready for therapy session.  Ambulation in room with RW to toilet with CG A and assist to manage IV pole.  Toileting completed with mod A to change brief, mod A for hygiene, she is able to pull pants down and needs min A for pants up due to brace.  Standing to wash hands with RW CS/CG.   Completed standing tolerance activities with puzzle task - tolerated 2 x 5 minutes. Completed light UE exercises with 1# dowel with no difficulty.  Patient ambulated back to room from therapy gym with RW and CG A.  Patient remained seated in w/c with seat belt alarm set and call bell in reach   Therapy Documentation Precautions:  Precautions Precautions: Fall, Back Precaution Comments: reviewed with patient, cueing to adhere to during tasks  Required Braces or Orthoses: Spinal Brace Spinal Brace: Applied in sitting position Restrictions Weight Bearing Restrictions: No Other Position/Activity Restrictions: (RN reports she plans to follow up with orthotech ) General:   Vital Signs: Therapy Vitals Temp: 98.3 F (36.8 C) Temp Source: Oral Pulse Rate: (!) 102 Resp: 18 BP: 134/68 Patient Position (if appropriate): Sitting Oxygen Therapy SpO2: 100 % O2 Device: Room Air Pain: Pain Assessment Pain Scale: 0-10 Pain Score: 1    Other  Treatments:     Therapy/Group: Individual Therapy  Carlos Levering 07/23/2018, 3:45 PM

## 2018-07-23 NOTE — Progress Notes (Signed)
Physical Therapy Session Note  Patient Details  Name: Kaitlin Vargas MRN: [id number] Date of Birth: 1950-11-12  Today's Date: 07/23/2018 PT Individual Time: 0900-1000 PT Individual Time Calculation (min): 60 min   Short Term Goals: Week 1:  PT Short Term Goal 1 (Week 1): pt will perform functional transfers with min A PT Short Term Goal 2 (Week 1): pt will perform gait x 100' with min A in controlled environment PT Short Term Goal 3 (Week 1): pt will adhere to back precautions during mobility and locomotion with min cues, wiht TLSO  Skilled Therapeutic Interventions/Progress Updates:    Pt received seated in w/c in room, agreeable to PT session. No complaints of pain. Requesting to use the bathroom. Toilet transfer with min A, dependent for pericare due to TLSO and back precautions. Ambulation 1 x 350 ft, 1 x 200 ft with RW and close Supervision. Ascend/descend 8 4" stairs with 2 handrails and min A. Ascend one time leading with LLE/descending with RLE then reverse for second round of stairs. Education with patient that stairs are to be performed ascending with strong LE and descending with weaker LE but that we are focusing on BLE strengthening during therapy session. Pt relies heavily on BLE on handrails with stairs. Sit to stand 3 x 5 reps to RW with CGA, focus on eccentric control when sitting. Pt left seated in w/c in room with needs in reach, quick release belt and chair alarm in place at end of session.  Therapy Documentation Precautions:  Precautions Precautions: Fall, Back Precaution Comments: reviewed with patient, cueing to adhere to during tasks  Required Braces or Orthoses: Spinal Brace Spinal Brace: Applied in sitting position Restrictions Weight Bearing Restrictions: No Other Position/Activity Restrictions: (RN reports she plans to follow up with [employer] )    Therapy/Group: Individual Therapy   Excell Seltzer, PT, DPT  07/23/2018, 10:57 AM

## 2018-07-23 NOTE — Progress Notes (Signed)
CHG bath done at this time.

## 2018-07-24 ENCOUNTER — Inpatient Hospital Stay (HOSPITAL_COMMUNITY): Payer: Medicare HMO | Admitting: Physical Therapy

## 2018-07-24 ENCOUNTER — Inpatient Hospital Stay (HOSPITAL_COMMUNITY): Payer: Medicare HMO | Admitting: Occupational Therapy

## 2018-07-24 ENCOUNTER — Inpatient Hospital Stay (HOSPITAL_COMMUNITY): Payer: Medicare HMO

## 2018-07-24 DIAGNOSIS — R195 Other fecal abnormalities: Secondary | ICD-10-CM

## 2018-07-24 LAB — GLUCOSE, CAPILLARY
GLUCOSE-CAPILLARY: 160 mg/dL — AB (ref 70–99)
Glucose-Capillary: 316 mg/dL — ABNORMAL HIGH (ref 70–99)
Glucose-Capillary: 227 mg/dL — ABNORMAL HIGH (ref 70–99)
Glucose-Capillary: 60 mg/dL — ABNORMAL LOW (ref 70–99)
Glucose-Capillary: 118 mg/dL — ABNORMAL HIGH (ref 70–99)

## 2018-07-24 MED ORDER — SACCHAROMYCES BOULARDII 250 MG PO CAPS
250.0000 mg | ORAL_CAPSULE | Freq: Two times a day (BID) | ORAL | Status: DC
  Administered 2018-07-24 – 2018-07-29 (×11): 250 mg via ORAL
  Filled 2018-07-24 (×11): qty 1

## 2018-07-24 MED ORDER — LOPERAMIDE HCL 2 MG PO CAPS
2.0000 mg | ORAL_CAPSULE | ORAL | Status: DC | PRN
  Administered 2018-07-24 (×2): 2 mg via ORAL
  Filled 2018-07-24 (×2): qty 1

## 2018-07-24 NOTE — Progress Notes (Signed)
Occupational Therapy Weekly Progress Note  Patient Details  Name: Kaitlin Vargas MRN: [id number] Date of Birth: 07-29-1950  Beginning of progress report period: July 17, 2018 End of progress report period: July 24, 2018  Today's Date: 07/24/2018 Session 1 OT Individual Time: 9211-9417 OT Individual Time Calculation (min): 55 min   Session 2 OT Individual Time: 1345-1415 OT Individual Time Calculation (min): 30 min    Patient has met 4 of 4 short term goals.  Pt is making great progress towards OT goals. Pt is able to recall her back precautions and demonstrates improved understanding of them throughout BADL tasks. Pt is at an overall min/CGA level for sit<>stands and ambulation within functional tasks. LB ADLs are prorgessing with adapted strategies and ADL AE. Pt has also demonstrated improved understanding of TLSO brace and can now don with set-up A. Continue current POC.  Patient continues to demonstrate the following deficits: muscle weakness, decreased activity tolerance and decreased standing balance, decreased postural control, decreased balance strategies and difficulty maintaining precautions and therefore will continue to benefit from skilled OT intervention to enhance overall performance with BADL.  Patient progressing toward long term goals..  Continue plan of care.  OT Short Term Goals Week 1:  OT Short Term Goal 1 (Week 1): Pt will tolerate standing for 2 mins with min A to increase endurance for BADL tasks OT Short Term Goal 1 - Progress (Week 1): Met OT Short Term Goal 2 (Week 1): Pt will complete 1 step of LB dressing task OT Short Term Goal 2 - Progress (Week 1): Met OT Short Term Goal 3 (Week 1): Pt will complete stand-piovt toilet transfer with min A OT Short Term Goal 3 - Progress (Week 1): Met OT Short Term Goal 4 (Week 1): Pt will complete sit<>stand with min A OT Short Term Goal 4 - Progress (Week 1): Met Week 2:  OT Short Term Goal 1 (Week 2): LTG=STG 2/2  ELOS  Skilled Therapeutic Interventions/Progress Updates:    Session 1 Pt greeted sitting in wc and agreeable to OT treatment session. UB bathing/dressing and grooming tasks completed from wc at the sink. Pt demonstrated improved understanding of TLSO brace and was able to don/doff in order to wash upper body and change shirts with supervision. UB strengthening with hair drying task using 1 UE to hold hair dryer and 2nd to hold brush. Pt reported need for bathroom and ambulated into bathroom w/ RW and close supervision. Pt needed CGA when managing pants. Voided bladder successfully w/ supervision for peri-care. Min A to manage pants afterwards 2/2 pants fallen to feet. Pt then ambulated 100 feet x2 with 1 seated rest break w/ RW and supervision. Pt returned to room and left seated in wc with safety belt on and nurse tech present to take CBG.   Session 2 OT treatment session focused on pt/family education. Pt ambulating out of bathroom with nurse tech upon OT arrival. Pt stood at the sink to wash hands with close supervision and correct placement of RW. Brought pt to tub room in wc for time management. Practiced ambulating into bathroom and completed tub bench transfer with min A to lift R LE into tub. Pts spouse observed without questions. OT educated on safe functional ambulation w/ RW and spouse ambulated with pt back to room. OT then had pt demonstrate donning/doffing of TLSO brace to spouse. Pt left seated in wc with safety belt on and needs met.   Therapy Documentation Precautions:  Precautions Precautions: Fall, Back  Precaution Comments: reviewed with patient, cueing to adhere to during tasks  Required Braces or Orthoses: Spinal Brace Spinal Brace: Applied in sitting position Restrictions Weight Bearing Restrictions: No Other Position/Activity Restrictions: (RN reports she plans to follow up with [employer] ) General:   Pain: Pain Assessment Pain Scale: 0-10 Pain Score: 5  Pain Type:  Acute pain Pain Location: Back Pain Orientation: Mid;Lower Pain Descriptors / Indicators: Aching;Discomfort Pain Frequency: Intermittent Pain Onset: On-going Patients Stated Pain Goal: 3 Pain Intervention(s): Repositioned   Therapy/Group: Individual Therapy  Valma Cava 07/24/2018, 2:15 PM

## 2018-07-24 NOTE — Progress Notes (Addendum)
Physical Therapy Session Note  Patient Details  Name: Kaitlin Vargas MRN: [id number] Date of Birth: January 09, 1951  Today's Date: 07/24/2018 PT Individual Time: 1520-1605 PT Individual Time Calculation (min): 45 min   Short Term Goals:  Week 2:  PT Short Term Goal 1 (Week 2): = LTG  Skilled Therapeutic Interventions/Progress Updates:  Pt's husband [name] here.  He measured most items at home.  They have decided pt will d/c to home using dtr's sedan rather than their mini van. He plans to remeve current mattress and replace it with a thinner one which he already owns.  Home measurement sheet posted on pt's bullitin board in room.  Gait from room to gym with RW, supervision.   Stand pivot transfer with RW wiht supervision.  Seated self stretching bil heel cords with flexed knees, pressing heels into floor, x 1 minute.  Seated self stretch R/L hamstrings using foot stool, x 30 seconds x 3 each.  Hand out issued to pt; husband able to cue pt appropriately for hamstring stretch.  Sustained stretch and balance challenge, standing with forefeet on wedge, bul UE support fading to 1UE support x 3 minutes.  Education for pt and husband provided on the benefits of improving flexibility of LEs.   Husband wheeled pt back to room in w/c.  IV nurse entering room to change PIC line bandage; she needed pt in bed.  PT suggested she ask Nsg to transfer pt to bed.  Pt left in care of Rn and husband.   Therapy Documentation Precautions:  Precautions Precautions: Fall, Back Precaution Comments: reviewed with patient, cueing to adhere to during tasks  Required Braces or Orthoses: Spinal Brace Spinal Brace: Applied in sitting position Restrictions Weight Bearing Restrictions: No Other Position/Activity Restrictions: (RN reports she plans to follow up with [employer] )  Pain: 7/10 surgical, back, premedicated         Therapy/Group: Individual Therapy  [name] 07/24/2018, 4:33 PM

## 2018-07-24 NOTE — Progress Notes (Signed)
Social Work Patient ID: Kaitlin Vargas, female   DOB: 01-14-1951, 68 y.o.   MRN: [id number]   Have reviewed team conference with pt and spouse who are aware and agreeable with targeted d/c date of 3/11 and supervision goals overall.  Spouse attending therapy sessions this afternoon.  Alerted rep for the infusion company who will provide pt with home IV abx and she plans to f/u with pt and spouse today as well.  Continue to follow.  Nayelli Inglis, LCSW

## 2018-07-24 NOTE — Progress Notes (Signed)
Physical Therapy Weekly Progress Note  Patient Details  Name: Kaitlin Vargas MRN: [id number] Date of Birth: 27-Apr-1951  Beginning of progress report period: July 18, 2018 End of progress report period: July 25, 2018  Today's Date: 07/24/2018 PT Individual Time: 0830-0925 PT Individual Time Calculation (min): 55 min   Pt performs stand pivot transfer with RW and CGA to [facility]. Pt requires max A with clothing management for toileting.  Pt performs gait 150', 100' x 2 with RW and supervision during session.  Stair neogtiation x 4 stairs with bilat handrails with mod A, cues for sequencing.  Step ups to 4'' step with RW for LE strengthening 2 x 10 bilat.  Standing therex for hip abd, mini squat, HS curl, heel raises all with CGA for balance, all 2 x 10.  Pt left in room with needs at hand, heat applied, alarm set.  Patient has met 3 of 3 short term goals.  Pt improving gait and mobility and is currently supervision with gait and transfers, min A bed mobility and stairs.  Patient continues to demonstrate the following deficits muscle weakness, decreased cardiorespiratoy endurance and decreased standing balance, decreased postural control and decreased balance strategies and therefore will continue to benefit from skilled PT intervention to increase functional independence with mobility.  Patient progressing toward long term goals..  Continue plan of care.  PT Short Term Goals Week 1:  PT Short Term Goal 1 (Week 1): pt will perform functional transfers with min A PT Short Term Goal 1 - Progress (Week 1): Met PT Short Term Goal 2 (Week 1): pt will perform gait x 100' with min A in controlled environment PT Short Term Goal 2 - Progress (Week 1): Met PT Short Term Goal 3 (Week 1): pt will adhere to back precautions during mobility and locomotion with min cues, wiht TLSO PT Short Term Goal 3 - Progress (Week 1): Met Week 2:  PT Short Term Goal 1 (Week 2): = LTG  Skilled Therapeutic  Interventions/Progress Updates:  Ambulation/gait training;Community reintegration;DME/adaptive equipment instruction;Neuromuscular re-education;Stair training;UE/LE Strength taining/ROM;Wheelchair propulsion/positioning;Balance/vestibular training;Discharge planning;Pain management;Therapeutic Activities;UE/LE Coordination activities;Functional mobility training;Patient/family education;Splinting/orthotics;Therapeutic Exercise   Therapy Documentation Precautions:  Precautions Precautions: Fall, Back Precaution Comments: reviewed with patient, cueing to adhere to during tasks  Required Braces or Orthoses: Spinal Brace Spinal Brace: Applied in sitting position Restrictions Weight Bearing Restrictions: No Other Position/Activity Restrictions: (RN reports she plans to follow up with [employer] ) Pain: Pt c/o 7/10 back pain, meds given prior to session, heat applied after session  Therapy/Group: Individual Therapy  , 07/24/2018, 8:59 AM

## 2018-07-24 NOTE — Progress Notes (Addendum)
Patient was reported to have had multiple stools during the shift. 4 episodes were recorded since 2300 last night & were mushy in consistency. No reports of fever, or abdominal pain. Nurse in charge of patient stated that patient was asking for something to make her stop having bowel movements. PA present was consulted & informed of number of stools, consistency & use of ancef & rifampin. No new orders at this time. Patient to be evaluated this morning. Will continue to monitor for changes

## 2018-07-24 NOTE — Progress Notes (Signed)
[facility] PHYSICAL MEDICINE & REHABILITATION PROGRESS NOTE  Subjective/Complaints: Had multiple mushy stools over night and yesterday. Stomach is sore/crampy. Appetite poor  ROS: Patient denies fever, rash, sore throat, blurred vision,   cough, shortness of breath or chest pain, joint or back pain, headache, or mood change.    Objective: Vital Signs: Blood pressure (!) 144/77, pulse 93, temperature 97.7 F (36.5 C), temperature source Oral, resp. rate 17, height 4\' 11"  (1.499 m), weight 72.4 kg, SpO2 98 %. No results found. Recent Labs    07/21/18 1820  WBC 6.4  HGB 9.9*  HCT 30.8*  PLT 326   Recent Labs    07/21/18 1820 07/23/18 0451  NA 132* 135  K 3.7 3.8  CL 94* 97*  CO2 29 31  GLUCOSE 154* 176*  BUN 13 13  CREATININE 0.51 0.61  CALCIUM 8.5* 8.4*    Physical Exam: BP (!) 144/77 (BP Location: Left Arm)   Pulse 93   Temp 97.7 F (36.5 C) (Oral)   Resp 17   Ht 4\' 11"  (1.499 m)   Wt 72.4 kg   SpO2 98%   BMI 32.24 kg/m     Constitutional: No distress . Vital signs reviewed. HEENT: EOMI, oral membranes moist Neck: supple Cardiovascular: RRR without murmur. No JVD    Respiratory: CTA Bilaterally without wheezes or rales. Normal effort    GI: BS +, minimally-tender, non-distended   Musculoskeletal: No edema or tenderness in extremities  Neurological: She is alert. No CN findings    Motor: 4/5 throughout, stable HOH  Skin: Foam dressing in place to right foot remains in place, honeycomb dressing back in place. Wounds CDI Psychiatric:pleasant affect Ext: tr LE edema   Assessment/Plan: 1. Functional deficits secondary to lumbar discitis/osteomyelitis with epidural abscess complicated by cardiac arrest status post decompressive lumbar laminectomy and posterior fixation which require 3+ hours per day of interdisciplinary therapy in a comprehensive inpatient rehab setting.  Physiatrist is providing close team supervision and 24 hour management of active  medical problems listed below.  Physiatrist and rehab team continue to assess barriers to discharge/monitor patient progress toward functional and medical goals  Care Tool:  Bathing    Body parts bathed by patient: Right arm, Left arm, Chest, Abdomen, Left upper leg, Right upper leg, Face, Front perineal area, Right lower leg, Left lower leg   Body parts bathed by helper: Buttocks     Bathing assist Assist Level: Minimal Assistance - Patient > 75%     Upper Body Dressing/Undressing Upper body dressing   What is the patient wearing?: Orthosis, Pull over shirt Orthosis activity level: Performed by helper  Upper body assist Assist Level: Minimal Assistance - Patient > 75%    Lower Body Dressing/Undressing Lower body dressing      What is the patient wearing?: Pants, Incontinence brief     Lower body assist Assist for lower body dressing: Minimal Assistance - Patient > 75%     Toileting Toileting    Toileting assist Assist for toileting: Maximal Assistance - Patient 25 - 49%     Transfers Chair/bed transfer  Transfers assist     Chair/bed transfer assist level: Contact Guard/Touching assist     Locomotion Ambulation   Ambulation assist      Assist level: Supervision/Verbal cueing Assistive device: Walker-rolling Max distance: 350'   Walk 10 feet activity   Assist     Assist level: Supervision/Verbal cueing Assistive device: Walker-rolling   Walk 50 feet activity   Assist  Assist level: Supervision/Verbal cueing Assistive device: Walker-rolling    Walk 150 feet activity   Assist Walk 150 feet activity did not occur: Safety/medical concerns  Assist level: Supervision/Verbal cueing Assistive device: Walker-rolling    Walk 10 feet on uneven surface  activity   Assist Walk 10 feet on uneven surfaces activity did not occur: Safety/medical concerns         Wheelchair     Assist Will patient use wheelchair at discharge?:  No Type of Wheelchair: Manual    Wheelchair assist level: Supervision/Verbal cueing Max wheelchair distance: 150    Wheelchair 50 feet with 2 turns activity    Assist        Assist Level: Supervision/Verbal cueing   Wheelchair 150 feet activity     Assist     Assist Level: Supervision/Verbal cueing      Medical Problem List and Plan: 1.  Decreased functional mobility lower extremity weakness secondary to MSSA lumbar spine discitis/osteomyelitis epidural abscess in the setting of bacteremia as well as right foot ulceration complicated by cardiac arrest.S/Pdecompressive lumbar laminectomy L-1-2 with hemi-facetectomy, posterior fixation T10-L3 07/11/2018 per Dr. Sherley Bounds. Back brace when out of bed applied in sitting position.  Cont CIR PT, OT  -encouraged her to work through her discomfort as possible  2.  Antithrombotics: -DVT/anticoagulation:  SCDs.  Initial vascular study to 12/08/2018 negative             -antiplatelet therapy: none 3. Pain Management: MS Contin 15 mg every 12 hours, Robaxin and hydrocodone as needed.Monitor mental status secondary to narcotics  -improved pain control, will reduce ms contin to daily---pt is in agreement 4. Mood: Provide emotional support             -antipsychotic agents: none 5. Neuropsych: This patient is capable of making decisions on her own behalf. 6. Skin/Wound Care:  Foam dressing to right foot change every 3 days as needed..Routine skin checks 7. Fluids/Electrolytes/Nutrition:  Routine in and out's with follow-up 8. ID/MSSA bacteremia/epidural abscess.IV Ancef 2 g every 8 hours,rifampin 300 mg every 12 hours initiated 07/11/2018 8 weeks. Follow-up infectious disease 9. Acute on chronic anemia. Patient has been transfused 2 units pack red blood cells 07/12/2018. Continue iron supplement  Hemoglobin 9.6 on 2/27---->9.9 3/3     Continue to monitor 10. Essential hypertension. Lopressor 25 mg twice a day,Lasix 40 mg daily,  lisinopril 10 mg daily.   Relatively controlled on 3/4  Monitor with increased mobility 11. Diabetes mellitus with peripheral neuropathy. Hemoglobin A1c 8.7. Lantus insulin 20 units daily. Check blood sugars before meals and at bedtime.       Titrated lantus to 24 units on 3/2---may need further titration, wait until diarrhea and po intake more consistent 12. Urinary retention. Urecholine 10 mg 3 times a day--dc.   Seems to be emptying better now  Allergic to Sulfa    13. B12 deficiency. Continue vitamin B-12 14.  Hyponatremia  Sodium 129 on 2/27---> up to 132 3/2 15.  Hypoalbuminemia  Supplement initiated on 2/27 16.  Constipation-now having loose stools  -may be a combination of reducing narcs and addition of rifampin to abx regimen  -holding all soft/lax, stop urecholine  -probiotic, imodium  -enocuraged ongoing po intake    17. LE edema: TEDS, elevation for now  -on lasix as above  LOS: 8 days A FACE TO FACE EVALUATION WAS PERFORMED  Kaitlin Vargas 07/24/2018, 8:48 AM

## 2018-07-25 ENCOUNTER — Inpatient Hospital Stay (HOSPITAL_COMMUNITY): Payer: Medicare HMO | Admitting: Occupational Therapy

## 2018-07-25 ENCOUNTER — Inpatient Hospital Stay (HOSPITAL_COMMUNITY): Payer: Medicare HMO

## 2018-07-25 ENCOUNTER — Inpatient Hospital Stay (HOSPITAL_COMMUNITY): Payer: Medicare HMO | Admitting: Physical Therapy

## 2018-07-25 LAB — GLUCOSE, CAPILLARY
GLUCOSE-CAPILLARY: 252 mg/dL — AB (ref 70–99)
Glucose-Capillary: 192 mg/dL — ABNORMAL HIGH (ref 70–99)
Glucose-Capillary: 233 mg/dL — ABNORMAL HIGH (ref 70–99)
Glucose-Capillary: 144 mg/dL — ABNORMAL HIGH (ref 70–99)

## 2018-07-25 NOTE — Progress Notes (Signed)
Pt's pain controlled with Norco, Abx continue. Pt slept well throughout the night. Pt OOB eating breakfast call light within reach.

## 2018-07-25 NOTE — Progress Notes (Signed)
Occupational Therapy Session Note  Patient Details  Name: Kaitlin Vargas MRN: [id number] Date of Birth: [birth year]  Today's Date: 07/25/2018 OT Individual Time: 0850-1000 OT Individual Time Calculation (min): 70 min    Short Term Goals: Week 2:  OT Short Term Goal 1 (Week 2): LTG=STG 2/2 ELOS  Skilled Therapeutic Interventions/Progress Updates:    Pt received sitting up in w/c with c/o pain 5/10 in B knees described as soreness, no request for intervention. Pt completed UB bathing at sink, donning and doffing TLSO with set up assist. Pt completed functional mobility into bathroom with RW with CGA. Pt voided urine and with CGA completed peri hygiene anterior and posterior in standing with unilateral support on grab bars. Foam dressing changed on pt's sacrum, with no redness observed under dressing. Min A required to pull up depends in standing, as well as cueing for LE positioning to increase ease. Pt able to assume figure 4 position to thread pants with cueing only. CGA to don sweat pants. Total A to don ted hose. Pt able to correctly insert shoe funnel and don shoe with elastic shoe laces with (S). Pt propelled w/c 125 ft to therapy gym where she completed B stepping activity on non-compliant surface with min A. Pt returned to room and completed toileting with min A. Pt left sitting up in w/c with all needs met.   Therapy Documentation Precautions:  Precautions Precautions: Fall, Back Precaution Comments: reviewed with patient, cueing to adhere to during tasks  Required Braces or Orthoses: Spinal Brace Spinal Brace: Applied in sitting position Restrictions Weight Bearing Restrictions: No Other Position/Activity Restrictions: (RN reports she plans to follow up with [employer] ) Pain: Pain Assessment Pain Scale: 0-10 Pain Score: 5  Pain Type: Acute pain Pain Location: Knee Pain Orientation: Right;Left Pain Descriptors / Indicators: Aching;Sore Pain Onset: On-going Pain Intervention(s):  Rest   Therapy/Group: Individual Therapy  Curtis Sites 07/25/2018, 9:20 AM

## 2018-07-25 NOTE — Progress Notes (Signed)
Occupational Therapy Session Note  Patient Details  Name: Kaitlin Vargas MRN: [id number] Date of Birth: 06-14-1950  Today's Date: 07/25/2018 OT Individual Time: 1400-1454 OT Individual Time Calculation (min): 54 min    Short Term Goals: Week 2:  OT Short Term Goal 1 (Week 2): LTG=STG 2/2 ELOS  Skilled Therapeutic Interventions/Progress Updates:    Pt greeted sitting in wc and agreeable to OT treatment session. Pt ambulated into bathroom w/ RW and close supervision. Pt voided bladder and was able to complete peri-care with supervision. Practiced toileting strategies using toilet extension aid to protect back. Pt needed assistance to pull pants up 2/2 falling to the floor. Pt demonstrated understanding with practice technique and encouraged to try toilet aid after next BM. Pt ambulated to dayroom w/ RW and close supervision. Pt completed 5 mins x 3 on [employer] arm bike for UB strengthening with 3 minute rest breaks in between sets. Pt reported max fatigue and ambulated back to room w/ RW and close supervision. Pt left seated in wc with needs met.   Therapy Documentation Precautions:  Precautions Precautions: Fall, Back Precaution Comments: reviewed with patient, cueing to adhere to during tasks  Required Braces or Orthoses: Spinal Brace Spinal Brace: Applied in sitting position Restrictions Weight Bearing Restrictions: No Other Position/Activity Restrictions: (RN reports she plans to follow up with [employer] ) Pain: Pain Assessment Pain Scale: 0-10 Pain Score: 5  Pain Type: Acute pain Pain Location: Back Pain Orientation: Lower Pain Descriptors / Indicators: Aching Pain Onset: On-going Pain Intervention(s): Repositioned   Therapy/Group: Individual Therapy  Valma Cava 07/25/2018, 2:56 PM

## 2018-07-25 NOTE — Significant Event (Signed)
Hypoglycemic Event  CBG 60  Treatment: 4 oz juice/soda  Symptoms: None  Follow-up CBG: [id number] CBG Result:118  Possible Reasons for Event: Unknown  Comments/MD notified:no     Kaitlin Vargas

## 2018-07-25 NOTE — Progress Notes (Signed)
Physical Therapy Session Note  Patient Details  Name: Kaitlin Vargas MRN: [id number] Date of Birth: [birth year]  Today's Date: 07/25/2018 PT Individual Time: 1045-1150 PT Individual Time Calculation (min): 65 min   Short Term Goals: Week 2:  PT Short Term Goal 1 (Week 2): = LTG  Skilled Therapeutic Interventions/Progress Updates:    pt performs gait in household and controlled environments with supervision with RW including tile and carpeted surfaces.  Standing balance with bilateral UE task with supervision with pt able to stand 3 minutes before fatigue.  Attempt bed mobility to pt's measured home bed height, pt unable to perform at 30'', able to perform supine <> sit at 25'' at max, pt's husband made aware.  Furniture transfers to pt's height of favorite chair at home with pt able to perform with supervision.  Stair negotiation with bilat handrails 4 steps x 2 with mod A.  nustep x 8 minutes for UE/LE strength and endurance with pt requiring 2 rest breaks.  Pt left in room with needs at hand, nursing present.  Therapy Documentation Precautions:  Precautions Precautions: Fall, Back Precaution Comments: reviewed with patient, cueing to adhere to during tasks  Required Braces or Orthoses: Spinal Brace Spinal Brace: Applied in sitting position Restrictions Weight Bearing Restrictions: No Other Position/Activity Restrictions: (RN reports she plans to follow up with [facility] ) Pain: Pt with no c/o pain during session, states her back feels "ok"   Therapy/Group: Individual Therapy  Carston Riedl 07/25/2018, 12:54 PM

## 2018-07-25 NOTE — Progress Notes (Signed)
[facility] PHYSICAL MEDICINE & REHABILITATION PROGRESS NOTE  Subjective/Complaints: Stools have slowed down since yesterday. None since 0749 3/5. Does report frequent urination. Nursing reports that urine is clear  ROS: Patient denies fever, rash, sore throat, blurred vision, nausea, vomiting, diarrhea, cough, shortness of breath or chest pain,  headache, or mood change.    Objective: Vital Signs: Blood pressure 138/78, pulse 90, temperature 98 F (36.7 C), temperature source Oral, resp. rate 18, height 4\' 11"  (1.499 m), weight 72.4 kg, SpO2 99 %. No results found. No results for input(s): WBC, HGB, HCT, PLT in the last 72 hours. Recent Labs    07/23/18 0451  NA 135  K 3.8  CL 97*  CO2 31  GLUCOSE 176*  BUN 13  CREATININE 0.61  CALCIUM 8.4*    Physical Exam: BP 138/78 (BP Location: Left Arm)   Pulse 90   Temp 98 F (36.7 C) (Oral)   Resp 18   Ht 4\' 11"  (1.499 m)   Wt 72.4 kg   SpO2 99%   BMI 32.24 kg/m     Constitutional: No distress . Vital signs reviewed. HEENT: EOMI, oral membranes moist Neck: supple Cardiovascular: RRR without murmur. No JVD    Respiratory: CTA Bilaterally without wheezes or rales. Normal effort    GI: BS +, non-tender, non-distended   Musculoskeletal: No edema or tenderness in extremities  Neurological: She is alert. No CN findings    Motor: 4/5 throughout, stable HOH  Skin: Foam dressing in place to right foot remains in place, honeycomb dressing to back Psychiatric:pleasant affect Ext: tr LE edema bilaterally Uro: mild odor to urine  Assessment/Plan: 1. Functional deficits secondary to lumbar discitis/osteomyelitis with epidural abscess complicated by cardiac arrest status post decompressive lumbar laminectomy and posterior fixation which require 3+ hours per day of interdisciplinary therapy in a comprehensive inpatient rehab setting.  Physiatrist is providing close team supervision and 24 hour management of active medical problems  listed below.  Physiatrist and rehab team continue to assess barriers to discharge/monitor patient progress toward functional and medical goals  Care Tool:  Bathing    Body parts bathed by patient: Right arm, Left arm, Chest, Abdomen, Left upper leg, Right upper leg, Face, Front perineal area, Right lower leg, Left lower leg   Body parts bathed by helper: Buttocks     Bathing assist Assist Level: Minimal Assistance - Patient > 75%     Upper Body Dressing/Undressing Upper body dressing   What is the patient wearing?: Orthosis, Pull over shirt Orthosis activity level: Performed by helper  Upper body assist Assist Level: Minimal Assistance - Patient > 75%    Lower Body Dressing/Undressing Lower body dressing      What is the patient wearing?: Pants, Incontinence brief     Lower body assist Assist for lower body dressing: Minimal Assistance - Patient > 75%     Toileting Toileting    Toileting assist Assist for toileting: Maximal Assistance - Patient 25 - 49%     Transfers Chair/bed transfer  Transfers assist     Chair/bed transfer assist level: Contact Guard/Touching assist     Locomotion Ambulation   Ambulation assist      Assist level: Supervision/Verbal cueing Assistive device: Walker-rolling Max distance: 350'   Walk 10 feet activity   Assist     Assist level: Supervision/Verbal cueing Assistive device: Walker-rolling   Walk 50 feet activity   Assist    Assist level: Supervision/Verbal cueing Assistive device: Walker-rolling  Walk 150 feet activity   Assist Walk 150 feet activity did not occur: Safety/medical concerns  Assist level: Supervision/Verbal cueing Assistive device: Walker-rolling    Walk 10 feet on uneven surface  activity   Assist Walk 10 feet on uneven surfaces activity did not occur: Safety/medical concerns         Wheelchair     Assist Will patient use wheelchair at discharge?: No Type of  Wheelchair: Manual    Wheelchair assist level: Supervision/Verbal cueing Max wheelchair distance: 150    Wheelchair 50 feet with 2 turns activity    Assist        Assist Level: Supervision/Verbal cueing   Wheelchair 150 feet activity     Assist     Assist Level: Supervision/Verbal cueing      Medical Problem List and Plan: 1.  Decreased functional mobility lower extremity weakness secondary to MSSA lumbar spine discitis/osteomyelitis epidural abscess in the setting of bacteremia as well as right foot ulceration complicated by cardiac arrest.S/Pdecompressive lumbar laminectomy L-1-2 with hemi-facetectomy, posterior fixation T10-L3 07/11/2018 per Dr. Sherley Bounds. Back brace when out of bed applied in sitting position.  Cont CIR PT, OT  -making steady progress with mobility 2.  Antithrombotics: -DVT/anticoagulation:  SCDs.  Initial vascular study to 12/08/2018 negative             -antiplatelet therapy: none 3. Pain Management: MS Contin  , Robaxin and hydrocodone as needed.Monitor mental status secondary to narcotics  -improved pain control  -will d/c QD MS contin and see how she does 4. Mood: Provide emotional support             -antipsychotic agents: none 5. Neuropsych: This patient is capable of making decisions on her own behalf. 6. Skin/Wound Care:  Foam dressing to right foot change every 3 days as needed..Routine skin checks 7. Fluids/Electrolytes/Nutrition:  Routine in and out's with follow-up 8. ID/MSSA bacteremia/epidural abscess.IV Ancef 2 g every 8 hours,rifampin 300 mg every 12 hours initiated 07/11/2018 8 weeks. Follow-up infectious disease 9. Acute on chronic anemia. Patient has been transfused 2 units pack red blood cells 07/12/2018. Continue iron supplement  Hemoglobin 9.6 on 2/27---->9.9 3/3--recheck monday     Continue to monitor 10. Essential hypertension. Lopressor 25 mg twice a day,Lasix 40 mg daily, lisinopril 10 mg daily.   Relatively  controlled on 3/6  Monitor with increased mobility 11. Diabetes mellitus with peripheral neuropathy. Hemoglobin A1c 8.7. Lantus insulin 20 units daily. Check blood sugars before meals and at bedtime.       Titrated lantus to 24 units on 3/2---may need further titration, wait until diarrhea and po intake more consistent 12. Urinary retention. Off urecholine.   Increased urinary frequency with ? Odor  -check ua today    13. B12 deficiency. Continue vitamin B-12 14.  Hyponatremia  Sodium 129 on 2/27---> up to 132 3/2---follow up Monday  15.  Hypoalbuminemia  Supplement initiated on 2/27 16.  Constipation---> loose stools---> now improved  -likely due to a combination of reducing narcs and addition of rifampin to abx regimen  -holding all soft/lax, stopped urecholine  -probiotic, imodium  -enocuraged ongoing po intake    17. LE edema: TEDS, elevation for now  -on lasix as above  LOS: 9 days A FACE TO FACE EVALUATION WAS PERFORMED  Meredith Staggers 07/25/2018, 9:32 AM

## 2018-07-26 ENCOUNTER — Inpatient Hospital Stay (HOSPITAL_COMMUNITY): Payer: Medicare HMO | Admitting: Occupational Therapy

## 2018-07-26 LAB — GLUCOSE, CAPILLARY
Glucose-Capillary: 175 mg/dL — ABNORMAL HIGH (ref 70–99)
Glucose-Capillary: 251 mg/dL — ABNORMAL HIGH (ref 70–99)
Glucose-Capillary: 212 mg/dL — ABNORMAL HIGH (ref 70–99)
Glucose-Capillary: 150 mg/dL — ABNORMAL HIGH (ref 70–99)

## 2018-07-26 NOTE — Progress Notes (Signed)
Occupational Therapy Session Note  Patient Details  Name: Kaitlin Vargas MRN: [id number] Date of Birth: July 03, 1950  Today's Date: 07/26/2018  Session 1 OT Individual Time: 0263-7858 OT Individual Time Calculation (min): 72 min   Session 2 OT Individual Time: 8502-7741 OT Individual Time Calculation (min): 34 min    Short Term Goals: Week 2:  OT Short Term Goal 1 (Week 2): LTG=STG 2/2 ELOS  Skilled Therapeutic Interventions/Progress Updates:    Session 1 Pt greeted semi-reclined in bed finishing breakfast and agreeable to OT treatment session. Pt came to sitting EOB and donned TLSO with set-up A. Pt ambulated to bathroom w/ RW and CGA. Pt able to remove brief, voided bladder, and completed peri-care with supervision. Min A to get brief from ankles. Bathing and dressing completed wc at the sink. Pt able to achieve figure 4 position today to wash LEs without OT assist. Set-up A and close supervision for balance when standing to wash buttocks using LH sponge. Figure 4 position used again to thread pant legs today. Sit<>stand w/ supervision at the sink and CGA for balance when pulling pants up over hips. OT assisted with donning TED hose using friction reducing bag, then pt was able to use shoe funnel to don shoes without OT assist. Worked on standing balance/endurandce while OT braided pt's hair in standing-supervision. Pt took seated rest break, then ambulated 200 ft w/ RW and close supervision. Pt returned to room and left seated in wc with needs met.   Session 2 Pt greeted siting in wc and agreeable to OT treatment session. Pt reports need to go to the bathroom. Pt ambulated into bathroom w/ RW and supervision. Pt voided bladder and completed 3/3 toileting steps with min A. Pt then stood at the sink and washed hands w/ supervision. Pt ambulated to therapy gym with RW and supervision. Dynamic standing activity with card matching activity w/ cards placed in RW bag. Supervision for balance and tolerated  standing for 4 mins. Pt completed obstacle course of weaving in and out of cones for RW management. Supervision. Pt ambulated back to room w/ RW and supervision. Pt returned to bed with min A to elevate feet into bed. Pt left with bed alarm on and spouse present.   Therapy Documentation Precautions:  Precautions Precautions: Fall, Back Precaution Comments: reviewed with patient, cueing to adhere to during tasks  Required Braces or Orthoses: Spinal Brace Spinal Brace: Applied in sitting position Restrictions Weight Bearing Restrictions: No Other Position/Activity Restrictions: (RN reports she plans to follow up with [facility] ) Pain: Pain Assessment Pain Scale: 0-10 Pain Score: 4  Pain Location: Back Pain Orientation: Lower Pain Descriptors / Indicators: Aching Pain Onset: On-going   Therapy/Group: Individual Therapy  Valma Cava 07/26/2018, 2:25 PM

## 2018-07-26 NOTE — Progress Notes (Signed)
[facility] PHYSICAL MEDICINE & REHABILITATION PROGRESS NOTE  Subjective/Complaints: Patient seen laying in bed this morning.  She states she slept well overnight.  She states she is sore from therapies yesterday.  ROS: Denies CP, SOB, N/V/D   Objective: Vital Signs: Blood pressure 136/65, pulse 100, temperature 97.6 F (36.4 C), temperature source Oral, resp. rate 15, height 4\' 11"  (1.499 m), weight 72.4 kg, SpO2 96 %. No results found. No results for input(s): WBC, HGB, HCT, PLT in the last 72 hours. No results for input(s): NA, K, CL, CO2, GLUCOSE, BUN, CREATININE, CALCIUM in the last 72 hours.  Physical Exam: BP 136/65 (BP Location: Left Arm)   Pulse 100   Temp 97.6 F (36.4 C) (Oral)   Resp 15   Ht 4\' 11"  (1.499 m)   Wt 72.4 kg   SpO2 96%   BMI 32.24 kg/m     Constitutional: No distress . Vital signs reviewed. HENT: Normocephalic.  Atraumatic. Eyes: EOMI. No discharge. Cardiovascular: RRR. No JVD. Respiratory: CTA Bilaterally. Normal effort. GI: BS +. Non-distended. Musc: No edema or tenderness in extremities. Neurological: She is alert.  Motor: 4/5 throughout, improving  HOH  Skin: Dressing C/D/I Psychiatric:pleasant affect  Assessment/Plan: 1. Functional deficits secondary to lumbar discitis/osteomyelitis with epidural abscess complicated by cardiac arrest status post decompressive lumbar laminectomy and posterior fixation which require 3+ hours per day of interdisciplinary therapy in a comprehensive inpatient rehab setting.  Physiatrist is providing close team supervision and 24 hour management of active medical problems listed below.  Physiatrist and rehab team continue to assess barriers to discharge/monitor patient progress toward functional and medical goals  Care Tool:  Bathing    Body parts bathed by patient: Right arm, Left arm, Chest, Abdomen, Left upper leg, Right upper leg, Face, Front perineal area, Right lower leg, Left lower leg, Buttocks   Body parts bathed by helper: Buttocks     Bathing assist Assist Level: Contact Guard/Touching assist     Upper Body Dressing/Undressing Upper body dressing   What is the patient wearing?: Orthosis, Pull over shirt Orthosis activity level: Performed by helper  Upper body assist Assist Level: Contact Guard/Touching assist    Lower Body Dressing/Undressing Lower body dressing      What is the patient wearing?: Pants, Incontinence brief     Lower body assist Assist for lower body dressing: Minimal Assistance - Patient > 75%     Toileting Toileting    Toileting assist Assist for toileting: Minimal Assistance - Patient > 75%     Transfers Chair/bed transfer  Transfers assist     Chair/bed transfer assist level: Contact Guard/Touching assist     Locomotion Ambulation   Ambulation assist      Assist level: Supervision/Verbal cueing Assistive device: Walker-rolling Max distance: 350'   Walk 10 feet activity   Assist     Assist level: Supervision/Verbal cueing Assistive device: Walker-rolling   Walk 50 feet activity   Assist    Assist level: Supervision/Verbal cueing Assistive device: Walker-rolling    Walk 150 feet activity   Assist Walk 150 feet activity did not occur: Safety/medical concerns  Assist level: Supervision/Verbal cueing Assistive device: Walker-rolling    Walk 10 feet on uneven surface  activity   Assist Walk 10 feet on uneven surfaces activity did not occur: Safety/medical concerns         Wheelchair     Assist Will patient use wheelchair at discharge?: No Type of Wheelchair: Manual    Wheelchair assist level:  Supervision/Verbal cueing Max wheelchair distance: 150    Wheelchair 50 feet with 2 turns activity    Assist        Assist Level: Supervision/Verbal cueing   Wheelchair 150 feet activity     Assist     Assist Level: Supervision/Verbal cueing      Medical Problem List and Plan: 1.   Decreased functional mobility lower extremity weakness secondary to MSSA lumbar spine discitis/osteomyelitis epidural abscess in the setting of bacteremia as well as right foot ulceration complicated by cardiac arrest.S/Pdecompressive lumbar laminectomy L-1-2 with hemi-facetectomy, posterior fixation T10-L3 07/11/2018 per Dr. Sherley Bounds. Back brace when out of bed applied in sitting position.  Cont CIR   Notes reviewed 2.  Antithrombotics: -DVT/anticoagulation:  SCDs.  Initial vascular study to 12/08/2018 negative             -antiplatelet therapy: none 3. Pain Management: Robaxin and hydrocodone as needed.Monitor mental status secondary to narcotics  -improved pain control  -DC'd MS contin  4. Mood: Provide emotional support             -antipsychotic agents: none 5. Neuropsych: This patient is capable of making decisions on her own behalf. 6. Skin/Wound Care:  Foam dressing to right foot change every 3 days as needed. Routine skin checks 7. Fluids/Electrolytes/Nutrition:  Routine in and out's with follow-up 8. ID/MSSA bacteremia/epidural abscess.IV Ancef 2 g every 8 hours,rifampin 300 mg every 12 hours initiated 07/11/2018 8 weeks. Follow-up infectious disease 9. Acute on chronic anemia. Patient has been transfused 2 units pack red blood cells 07/12/2018. Continue iron supplement  Hemoglobin 9.9 on 3/2  Labs ordered for Monday    Continue to monitor 10. Essential hypertension. Lopressor 25 mg twice a day,Lasix 40 mg daily, lisinopril 10 mg daily.   Slightly labile on 3/7   Monitor with increased mobility 11. Diabetes mellitus with peripheral neuropathy. Hemoglobin A1c 8.7. Lantus insulin 20 units daily. Check blood sugars before meals and at bedtime.    Lantus 24 units  Labile,?  Improving on 3/7 12. Urinary retention. Off urecholine.  13. B12 deficiency. Continue vitamin B-12 14.  Hyponatremia  Sodium 135 on 3/4  Labs ordered for Monday 15.  Hypoalbuminemia  Supplement initiated  on 2/27 16.  Constipation---> loose stools---> now improved  -likely due to a combination of reducing narcs and addition of rifampin to abx regimen  -holding all soft/lax, stopped urecholine  -probiotic, imodium  -enocuraged ongoing po intake 17. LE edema: TEDS, elevation for now  -on lasix as above  LOS: 10 days A FACE TO FACE EVALUATION WAS PERFORMED  Ankit Lorie Phenix 07/26/2018, 7:52 AM

## 2018-07-26 NOTE — Progress Notes (Signed)
Pt currently resting hydrocodone for pain with relief. PICC line to RAC patent dressing clean dry intact with antimicrobial disc in place. Pt has been continent this evening.

## 2018-07-27 DIAGNOSIS — E1142 Type 2 diabetes mellitus with diabetic polyneuropathy: Secondary | ICD-10-CM

## 2018-07-27 DIAGNOSIS — E1165 Type 2 diabetes mellitus with hyperglycemia: Secondary | ICD-10-CM

## 2018-07-27 LAB — GLUCOSE, CAPILLARY
Glucose-Capillary: 273 mg/dL — ABNORMAL HIGH (ref 70–99)
Glucose-Capillary: 209 mg/dL — ABNORMAL HIGH (ref 70–99)
Glucose-Capillary: 390 mg/dL — ABNORMAL HIGH (ref 70–99)
Glucose-Capillary: 122 mg/dL — ABNORMAL HIGH (ref 70–99)

## 2018-07-27 MED ORDER — INSULIN ASPART 100 UNIT/ML ~~LOC~~ SOLN
4.0000 [IU] | Freq: Three times a day (TID) | SUBCUTANEOUS | Status: DC
  Administered 2018-07-27 – 2018-07-29 (×5): 4 [IU] via SUBCUTANEOUS

## 2018-07-27 NOTE — Progress Notes (Signed)
[facility] PHYSICAL MEDICINE & REHABILITATION PROGRESS NOTE  Subjective/Complaints: Patient seen laying in bed this morning.  She states she slept well overnight.  She denies complaints.  ROS: Denies CP, SOB, N/V/D   Objective: Vital Signs: Blood pressure 140/76, pulse (!) 117, temperature 98.5 F (36.9 C), temperature source Oral, resp. rate 16, height 4\' 11"  (1.499 m), weight 72.4 kg, SpO2 98 %. No results found. No results for input(s): WBC, HGB, HCT, PLT in the last 72 hours. No results for input(s): NA, K, CL, CO2, GLUCOSE, BUN, CREATININE, CALCIUM in the last 72 hours.  Physical Exam: BP 140/76 (BP Location: Left Arm)   Pulse (!) 117   Temp 98.5 F (36.9 C) (Oral)   Resp 16   Ht 4\' 11"  (1.499 m)   Wt 72.4 kg   SpO2 98%   BMI 32.24 kg/m     Constitutional: No distress . Vital signs reviewed. HENT: Normocephalic.  Atraumatic. Eyes: EOMI. No discharge. Cardiovascular: RRR.  No JVD. Respiratory: CTA bilaterally.  Normal effort. GI: BS +. Non-distended. Musc: No edema or tenderness in extremities. Neurological: She is alert.  Motor: 4/5 throughout, stable HOH  Skin: Dressing C/D/I Psychiatric:pleasant affect  Assessment/Plan: 1. Functional deficits secondary to lumbar discitis/osteomyelitis with epidural abscess complicated by cardiac arrest status post decompressive lumbar laminectomy and posterior fixation which require 3+ hours per day of interdisciplinary therapy in a comprehensive inpatient rehab setting.  Physiatrist is providing close team supervision and 24 hour management of active medical problems listed below.  Physiatrist and rehab team continue to assess barriers to discharge/monitor patient progress toward functional and medical goals  Care Tool:  Bathing    Body parts bathed by patient: Right arm, Left arm, Chest, Abdomen, Left upper leg, Right upper leg, Face, Front perineal area, Right lower leg, Left lower leg, Buttocks   Body parts bathed by  helper: Buttocks     Bathing assist Assist Level: Contact Guard/Touching assist Assistive Device Comment: long handled sponge   Upper Body Dressing/Undressing Upper body dressing   What is the patient wearing?: Pull over shirt, Orthosis Orthosis activity level: Performed by helper  Upper body assist Assist Level: Set up assist    Lower Body Dressing/Undressing Lower body dressing      What is the patient wearing?: Underwear/pull up, Pants     Lower body assist Assist for lower body dressing: Contact Guard/Touching assist     Toileting Toileting    Toileting assist Assist for toileting: Supervision/Verbal cueing     Transfers Chair/bed transfer  Transfers assist     Chair/bed transfer assist level: Contact Guard/Touching assist     Locomotion Ambulation   Ambulation assist      Assist level: Supervision/Verbal cueing Assistive device: Walker-rolling Max distance: 350'   Walk 10 feet activity   Assist     Assist level: Supervision/Verbal cueing Assistive device: Walker-rolling   Walk 50 feet activity   Assist    Assist level: Supervision/Verbal cueing Assistive device: Walker-rolling    Walk 150 feet activity   Assist Walk 150 feet activity did not occur: Safety/medical concerns  Assist level: Supervision/Verbal cueing Assistive device: Walker-rolling    Walk 10 feet on uneven surface  activity   Assist Walk 10 feet on uneven surfaces activity did not occur: Safety/medical concerns         Wheelchair     Assist Will patient use wheelchair at discharge?: No Type of Wheelchair: Manual    Wheelchair assist level: Supervision/Verbal cueing Max wheelchair  distance: 150    Wheelchair 50 feet with 2 turns activity    Assist        Assist Level: Supervision/Verbal cueing   Wheelchair 150 feet activity     Assist     Assist Level: Supervision/Verbal cueing      Medical Problem List and Plan: 1.   Decreased functional mobility lower extremity weakness secondary to MSSA lumbar spine discitis/osteomyelitis epidural abscess in the setting of bacteremia as well as right foot ulceration complicated by cardiac arrest.S/Pdecompressive lumbar laminectomy L-1-2 with hemi-facetectomy, posterior fixation T10-L3 07/11/2018 per Dr. Sherley Bounds. Back brace when out of bed applied in sitting position.  Cont CIR  2.  Antithrombotics: -DVT/anticoagulation:  SCDs.  Initial vascular study to 12/08/2018 negative             -antiplatelet therapy: none 3. Pain Management: Robaxin and hydrocodone as needed.Monitor mental status secondary to narcotics  -improved pain control  -DC'd MS contin  4. Mood: Provide emotional support             -antipsychotic agents: none 5. Neuropsych: This patient is capable of making decisions on her own behalf. 6. Skin/Wound Care:  Foam dressing to right foot change every 3 days as needed. Routine skin checks 7. Fluids/Electrolytes/Nutrition:  Routine in and out's with follow-up 8. ID/MSSA bacteremia/epidural abscess.IV Ancef 2 g every 8 hours,rifampin 300 mg every 12 hours initiated 07/11/2018 8 weeks. Follow-up infectious disease 9. Acute on chronic anemia. Patient has been transfused 2 units pack red blood cells 07/12/2018. Continue iron supplement  Hemoglobin 9.9 on 3/2  Labs ordered for tomorrow    Continue to monitor 10. Essential hypertension. Lopressor 25 mg twice a day,Lasix 40 mg daily, lisinopril 10 mg daily.   Relatively controlled on 3/8  Monitor with increased mobility 11. Diabetes mellitus with peripheral neuropathy. Hemoglobin A1c 8.7. Check blood sugars before meals and at bedtime.    Lantus 24 units  NovoLog 4 units 3 times daily started on 3/8  Labile,?  Improving on 3/7 12. Urinary retention. Off urecholine.  13. B12 deficiency. Continue vitamin B-12 14.  Hyponatremia  Sodium 135 on 3/4  Labs ordered for tomorrow 15.  Hypoalbuminemia  Supplement  initiated on 2/27 16.  Constipation  Improving  -likely due to a combination of reducing narcs and addition of rifampin to abx regimen  -holding all soft/lax, stopped urecholine  -probiotic, imodium  -enocuraged ongoing po intake 17. LE edema: TEDS, elevation for now  -on lasix as above  LOS: 11 days A FACE TO FACE EVALUATION WAS PERFORMED  Kaitlin Vargas Lorie Phenix 07/27/2018, 4:03 PM

## 2018-07-28 ENCOUNTER — Inpatient Hospital Stay (HOSPITAL_COMMUNITY): Payer: Medicare HMO | Admitting: Occupational Therapy

## 2018-07-28 ENCOUNTER — Inpatient Hospital Stay (HOSPITAL_COMMUNITY): Payer: Medicare HMO | Admitting: Physical Therapy

## 2018-07-28 LAB — GLUCOSE, CAPILLARY
GLUCOSE-CAPILLARY: 137 mg/dL — AB (ref 70–99)
GLUCOSE-CAPILLARY: 87 mg/dL (ref 70–99)
Glucose-Capillary: 138 mg/dL — ABNORMAL HIGH (ref 70–99)
Glucose-Capillary: 219 mg/dL — ABNORMAL HIGH (ref 70–99)

## 2018-07-28 NOTE — Progress Notes (Signed)
Occupational Therapy Discharge Summary  Patient Details  Name: Kaitlin Vargas MRN: [id number] Date of Birth: January 04, 1951  Today's Date: 07/28/2018 OT Individual Time: 1335-1435 OT Individual Time Calculation (min): 60 min    Patient has met 8 of 9 long term goals due to improved activity tolerance, improved balance and postural control.  Patient to discharge at overall Supervision level.  Patient's care partner is independent to provide the necessary physical assistance at discharge.    Reasons goals not met: shower goal now NA due to plans to sponge bathe until sutures are removed  Recommendation:  Patient will benefit from ongoing skilled OT services in home health setting to continue to advance functional skills in the area of BADL and Reduce care partner burden.  Equipment: tub bench  Reasons for discharge: treatment goals met  Patient/family agrees with progress made and goals achieved: Yes  OT Discharge Precautions/Restrictions  Precautions Precautions: Fall;Back Required Braces or Orthoses: Spinal Brace Spinal Brace: Applied in sitting position Restrictions Weight Bearing Restrictions: No General   Vital Signs Therapy Vitals Temp: 98.4 F (36.9 C) Temp Source: Oral Pulse Rate: (!) 102 Resp: 16 BP: 118/65 Patient Position (if appropriate): Lying Oxygen Therapy SpO2: 99 % O2 Device: Room Air Pain   ADL ADL Equipment Provided: Long-handled sponge, Other (comment)(shoe aide) Eating: Independent Where Assessed-Eating: Chair Grooming: Supervision/safety Where Assessed-Grooming: Standing at sink Upper Body Bathing: Supervision/safety Where Assessed-Upper Body Bathing: Sitting at sink Lower Body Bathing: Supervision/safety Where Assessed-Lower Body Bathing: Sitting at sink Upper Body Dressing: Setup Where Assessed-Upper Body Dressing: Wheelchair Lower Body Dressing: Supervision/safety Where Assessed-Lower Body Dressing: Wheelchair Toileting:  Supervision/safety Where Assessed-Toileting: Glass blower/designer: Close supervision Toilet Transfer Method: Counselling psychologist: Grab bars Vision Baseline Vision/History: Wears glasses Patient Visual Report: No change from baseline Perception  Perception: Within Functional Limits Praxis Praxis: Intact Cognition Overall Cognitive Status: Within Functional Limits for tasks assessed Arousal/Alertness: Awake/alert Orientation Level: Oriented X4 Memory: Appears intact Awareness: Appears intact Safety/Judgment: Appears intact Sensation Coordination Gross Motor Movements are Fluid and Coordinated: Yes Fine Motor Movements are Fluid and Coordinated: Yes Motor  Motor Motor - Discharge Observations: improving strength Mobility  Transfers Sit to Stand: Supervision/Verbal cueing Stand to Sit: Supervision/Verbal cueing  Trunk/Postural Assessment     Balance Static Sitting Balance Static Sitting - Level of Assistance: 6: Modified independent (Device/Increase time) Dynamic Sitting Balance Dynamic Sitting - Balance Support: During functional activity Dynamic Sitting - Level of Assistance: 6: Modified independent (Device/Increase time) Static Standing Balance Static Standing - Balance Support: During functional activity;Bilateral upper extremity supported Static Standing - Level of Assistance: 5: Stand by assistance Dynamic Standing Balance Dynamic Standing - Balance Support: Bilateral upper extremity supported Dynamic Standing - Level of Assistance: 5: Stand by assistance Extremity/Trunk Assessment RUE Assessment RUE Assessment: Within Functional Limits General Strength Comments: 4/5 LUE Assessment LUE Assessment: Within Functional Limits General Strength Comments: 4/5   Kaitlin Vargas A Kaitlin Vargas 07/28/2018, 3:48 PM

## 2018-07-28 NOTE — Discharge Summary (Signed)
Physician Discharge Summary  Patient ID: Kaitlin Vargas MRN: [id number] DOB/AGE: 70-Oct-1952 70 y.o.  Admit date: 07/16/2018 Discharge date: 07/29/2018  Discharge Diagnoses:  Active Problems:   MSSA bacteremia   S/P lumbar fusion   Drug induced constipation   Hypoalbuminemia due to protein-calorie malnutrition (HCC)   Diabetes mellitus type 2 in obese ([place])   Poorly controlled type 2 diabetes mellitus with peripheral neuropathy (HCC) Acute on chronic anemia Essential hypertension B12 deficiency   Discharged Condition: stable  Significant Diagnostic Studies: Dg Thoracolumabar Spine  Result Date: 07/11/2018 CLINICAL DATA:  Thoracolumbar fusion.  Spinal infection. EXAM: THORACOLUMBAR SPINE 1V COMPARISON:  Lumbar MRI 07/09/2018 FINDINGS: C-arm images were obtained of the thoracic and lumbar spine. Bilateral pedicle screw and posterior rod fusion extending from T10 through L3. Endplate erosion at H7-0 consistent with infection IMPRESSION: Pedicle screw and posterior rod fusion T10 through L3. Electronically Signed   By: Franchot Gallo M.D.   On: 07/11/2018 19:30   Ct Lumbar Spine Wo Contrast  Result Date: 07/09/2018 CLINICAL DATA:  Low back pain. Assess stenosis. History of discitis osteomyelitis. EXAM: CT LUMBAR SPINE WITHOUT CONTRAST TECHNIQUE: Multidetector CT imaging of the lumbar spine was performed without intravenous contrast administration. Multiplanar CT image reconstructions were also generated. COMPARISON:  MRI of the lumbar spine July 09, 2018 and CT abdomen and pelvis June 16, 2018. FINDINGS: SEGMENTATION: For the purposes of this report the last well-formed intervertebral disc space is reported as L5-S1. ALIGNMENT: Maintained lumbar lordosis. Minimal grade 1 L4-5 anterolisthesis. VERTEBRAE: Severe L1-2 disc height loss with subsidence, destructive bony changes L1 inferior and L2 superior endplates with mild L2 height loss, progressed from prior examination. Remaining lumbar  vertebral bodies intact. Remaining disc heights preserved. PARASPINAL AND OTHER SOFT TISSUES: Abnormal soft tissue contiguous with RIGHT crus of the diaphragm with punctate calcifications. Abnormal prevertebral soft tissue swelling with punctate calcifications. Through L2-3 with bilateral iliopsoas fat stranding. Mild calcific atherosclerosis aortoiliac vessels. DISC LEVELS: Soft tissue and punctate bony fragments at L1-2 result in severe canal stenosis with narrowed neural foramen which may affect the traversing L2 nerves. Neural foraminal stenosis better demonstrated on today's MRI. Progressive widening LEFT L4-5 facet at 3 mm. IMPRESSION: 1. 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 Severe L1-2 canal stenosis. 2. Prevertebral phlegmon/abscess better demonstrated on today's MRI; given presence of punctate calcifications, recommend exclusion of tuberculosis. 3. Increased widening of LEFT L4-5 facet may be infectious or inflammatory. Grade 1 L4-5 anterolisthesis without spondylolysis. 4. These results will be called to the ordering clinician or representative by the professional radiologist assistant, and communication documented in [facility] Dashboard. Electronically Signed   By: Elon Alas M.D.   On: 07/09/2018 19:23   Mr Jeri Cos YO Contrast  Result Date: 07/10/2018 CLINICAL DATA:  Encephalopathy.  Rule out infection EXAM: MRI HEAD WITHOUT AND WITH CONTRAST TECHNIQUE: Multiplanar, multiecho pulse sequences of the brain and surrounding structures were obtained without and with intravenous contrast. CONTRAST:  7.5 mL Gadovist IV COMPARISON:  None. FINDINGS: Brain: Generalized atrophy without hydrocephalus. Mild chronic changes in the white matter. No acute infarct. Negative for hemorrhage or mass. No edema. Normal enhancement postcontrast infusion. No enhancing mass or abscess. Leptomeningeal enhancement normal. Vascular: Normal vascular flow voids. Skull and upper  cervical spine: Negative Sinuses/Orbits: Mild mucosal edema paranasal sinuses. Bilateral mastoid effusion. Bilateral cataract surgery Other: None IMPRESSION: No acute intracranial abnormality Mild atrophy and mild chronic microvascular ischemic change in the white matter. Electronically Signed   By: Juanda Crumble  Carlis Abbott M.D.   On: 07/10/2018 12:31   Mr Lumbar Spine W Wo Contrast  Result Date: 07/09/2018 CLINICAL DATA:  Back pain.  Follow-up discitis osteomyelitis. EXAM: MRI LUMBAR SPINE WITHOUT AND WITH CONTRAST TECHNIQUE: Multiplanar and multiecho pulse sequences of the lumbar spine were obtained without and with intravenous contrast. CONTRAST:  7.7 cc Gadavist COMPARISON:  MRI of the lumbar spine June 16, 2018. FINDINGS: SEGMENTATION: For the purposes of this report, the last well-formed intervertebral disc is reported as L5-S1. ALIGNMENT: Maintained lumbar lordosis. Grade 1 L4-5 anterolisthesis without spondylolysis. VERTEBRAE: Slight interval expansion of L1-2 disc height loss with worsening rim enhancing fluid extending into the prevertebral and epidural space. Diffusely low T1 and bright T2 signal within the L1 and L2 vertebral bodies. Remaining lumbar vertebral bodies and disc spaces intact. Scattered focal fat versus hemangiomas. CONUS MEDULLARIS AND CAUDA EQUINA: Conus medullaris deformity and faint suspected cord edema at L1-2. No nerve root clumping or leptomeningeal enhancement. PARASPINAL AND OTHER SOFT TISSUES:Moderately motion degraded axial sequences. Epidural abscess from T12-L2 0.6 x 0.7 x 6.3 cm (transverse by AP by cc) ventral epidural abscess. Dorsal epidural enhancement consistent with infection without focal fluid. Rim enhancing abscess contiguous with RIGHT crus of the diaphragm measuring to 2.1 cm with prevertebral phlegmon from T11-12 through L2-3. Bilateral iliopsoas muscle myositis without focal fluid collection though axial sequences motion degraded. Subcentimeter layering gallstones.  DISC LEVELS: T11-12: Ventral epidural abscess measuring to 3 mm without canal stenosis or neural foraminal narrowing. T12-L1: Ventral epidural abscess measuring to 4 mm. Mild facet arthropathy and ligamentum flavum redundancy. Mild canal stenosis is slightly narrowed LEFT lateral recess which may affect the traversing LEFT L1 nerve. No neural foraminal narrowing. L1-2: Ventral epidural abscess. Enhancement within RIGHT facet. Moderate canal stenosis with narrowed lateral recesses which may affect the traversing L2 nerve. Soft tissue resulting in moderate to severe neural foraminal narrowing. L2-3: No disc bulge, canal stenosis nor neural foraminal narrowing. Mild facet arthropathy. L3-4: Annular bulging asymmetric to the RIGHT, mild facet arthropathy. No canal stenosis. Mild RIGHT neural foraminal narrowing. L4-5: Anterolisthesis. Annular bulging, moderate to severe facet arthropathy and ligamentum flavum redundancy with bilateral facet effusions, enhancing on the LEFT. No canal stenosis. Moderate RIGHT neural foraminal narrowing. L5-S1: No disc bulge, canal stenosis nor neural foraminal narrowing. Mild facet arthropathy. IMPRESSION: 1. 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 Prevertebral abscess contiguous with RIGHT crus of the diaphragm measuring to 2.1 cm. Iliopsoas myositis. 2. Worsening moderate canal stenosis L1-2 due to epidural collection. Moderate to severe L1-2 neural foraminal effacement. 3. Worsening LEFT L4-5 facet enhancement may be reactive or infectious. Electronically Signed   By: Elon Alas M.D.   On: 07/09/2018 15:05   Dg C-arm 1-60 Min  Result Date: 07/11/2018 CLINICAL DATA:  Thoracolumbar fusion.  Spinal infection. EXAM: THORACOLUMBAR SPINE 1V COMPARISON:  Lumbar MRI 07/09/2018 FINDINGS: C-arm images were obtained of the thoracic and lumbar spine. Bilateral pedicle screw and posterior rod fusion extending from T10 through L3. Endplate erosion at A2-6  consistent with infection IMPRESSION: Pedicle screw and posterior rod fusion T10 through L3. Electronically Signed   By: Franchot Gallo M.D.   On: 07/11/2018 19:30   [place] Ekg Site Rite  Result Date: 07/17/2018 If [facility] image not attached, placement could not be confirmed due to current cardiac rhythm.   Labs:  Basic Metabolic Panel: Recent Labs  Lab 07/21/18 1820 07/23/18 0451  NA 132* 135  K 3.7 3.8  CL 94* 97*  CO2 29 31  GLUCOSE 154* 176*  BUN 13 13  CREATININE 0.51 0.61  CALCIUM 8.5* 8.4*    CBC: Recent Labs  Lab 07/21/18 1820  WBC 6.4  NEUTROABS 3.9  HGB 9.9*  HCT 30.8*  MCV 95.7  PLT 326    CBG: Recent Labs  Lab 07/26/18 2128 07/27/18 0625 07/27/18 1215 07/27/18 1614 07/27/18 2118  GLUCAP 150* 273* 209* 390* 122*   Family history. Positive for diabetes and hypertension. Patient denies cancer in the family  Brief HPI:    Kaitlin Vargas is a 68 year old right-handed female with history of hypertension, hyperlipidemia, B12 deficiency and diabetes mellitus. Patient with reported back pain 4 weeks. Per chart review patient lives with spouse. One level home with ramped entrance. Independent with assistive device prior to admission. She recently came to the hospital with cough diagnosed with adenovirus in mid-January received antibiotic coverage recovering nicely and was discharged to home. She continued to have back pain radiating to lower extremities as well as bouts of urinary incontinence and stool incontinence. She presented to [facility] on 06/16/2018 with increasing back pain with lower extremity weakness and a CT scan of the spine showed what appeared to be osteomyelitis. She was transferred to [facility] for interventional radiology completed lumbar puncture and during the procedure noted increased bleeding as well as cardiac arrest requiring CPR 90 seconds. She did not require intubation. It was felt cardiac arrest likely related to  respiratory arrest following sedation. 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 Patient also with right foot ulceration. MRI of the foot negative for acute bony or joint abnormality. Small volume of fluid in the subcutaneous tissue in the first TMT joint. Infectious disease consulted patient with GPCs growing from lumbar spine aspirate. Placed on antibiotic therapy. TEE negative for vegetation. She had been placed on Cefazolin per infectious disease through 08/13/2018. Subcutaneous heparin for DVT prophylaxis. Hospital course pain management maintained on long acting MS Contin. Acute on chronic anemia 7.6 transfused 2 units of packed red blood cells 07/12/2018 and monitored as well as bouts of hyponatremia 130. Therapy evaluations completed and patient was admitted to inpatient rehabilitation services to 06/26/2018. Patient was progressing slowly developed increased back pain on 07/09/2018. MRI lumbar spine with and without contrast showed worsening L1-2 discitis osteomyelitis epidural abscess. Also showed prevertebral abscess. Infectious disease as well as neurosurgery consulted and plan was discharge to acute care services for ongoing care 07/10/2018. After long discussion between neurosurgery and infectious disease it was decided to explore abscesses patient appeared not to be responding to antibiotics and underwent decompressive lumbar laminectomy lumbar L1-2 left with hemi-facetectomy and sub-laminar decompression and discectomy. Disc was sent for biopsy and culture. Posterior fixation T10-L3 using pedicle screws. Intertransverse arthrodesis T10-L3 with bone marrow aspirate obtained through a separate fascial incision over the left iliac crest 07/11/2018 per Dr. Sherley Bounds. Back brace when out of bed applied in sitting position. Pain management ongoing with the use of MS Contin. Infectious disease follow-up currently  on 8 weeks of intravenous Ancef as well as rifampin 300 mg 3 times a day. TEE completed 07/15/2018 rule out endocarditis showing a small oscillating density on the aortic side of aortic valve concerning for vegetation. No further intervention advised advise follow-up TEE after antibiotic completed as reviewed by cardiothoracic surgery. Therapy evaluations were resumed and patient was again admitted for a comprehensive rehabilitation program.  Physical exam  blood pressure 127/62 pulse 99 temp 98 respirations 14 oxygen saturation is 97% Constitutional. Patient well-developed. Head normocephalic atraumatic. Eyes EOMs normal right eye exhibits no discharge left eye exhibits no discharge. Negative nystagmus. Neck is supple nontender no JVD no thyromegaly without bruit cardiac rate controlled no murmur rubs or gallops respiratory effort normal and breath sounds normal no rhonchi wheezes or rails. Abdomen soft nontender good bowel sounds no rebound neurological she is alert and makes good eye contact with examiner oriented to person place and time. Motor grossly graded 4 minus out of 5 throughout. Right foot with dressing in place no odor.   Hospital Course: Kaitlin Vargas was admitted to rehab 07/16/2018 for inpatient therapies to consist of PT, ST and OT at least three hours five days a week. Past admission physiatrist, therapy team and rehab RN have worked together to provide customized collaborative inpatient rehab. The following issues were addressed. Pertaining to Kaitlin Vargas MSSS lumbar discitis osteomyelitis abscess she had undergone decompression lumbar laminectomy arthrodesis 07/11/2018 per Dr. Sherley Bounds. Back brace when out of bed applied in sitting position. Plan per infectious disease was to continue intravenous Ancef 2 g every 8 hours as well as rifampin 300 mg every 12 hours through  09/05/2018 and follow-up TEE after antibiotic completed. SCDs for DVT prophylaxis. Latest venous Doppler studies negative.  Pain management with the use of Robaxin and hydrocodone her MS Contin had been discontinued as her pain had improved. Mood stabilization with emotional support provided. Acute on chronic anemia hemoglobin stable 9.9 she had been transfused during her acute care stay. Blood pressures overall controlled  With lisinopril 10 mg daily and Lopressor 25 mg twice a day. Close monitoring of blood sugars Lantus insulin 24 units daily.her hemoglobin A1c was 8.7. Diabetic teaching as noted. Initially with some difficulty voiding she had since been removed from Urecholine voiding without difficulty. B12 deficiency she continued on vitamin B12. Bouts of constipation likely induced by narcotics that improved with adjustments in medications. No nausea vomiting noted.   Rehab course: During patient's stay in rehab weekly team conferences were held to monitor patient's progress, set goals and discuss barriers to discharge. At admission, patient required moderate assist to ambulate 65 feet rolling walker. Moderate assist stand pivot transfers and sit to stand. She required minimal assist upper body total assist lower body ADLs.  She  has had improvement in activity tolerance, balance, postural control as well as ability to compensate for deficits. He/She has had improvement in functional use RUE/LUE  and RLE/LLE as well as improvement in awareness.Patient received weekly collaborative interdisciplinary team conferences to discuss estimated length of stay family teaching. Patient performed ambulation and household in controlled environments with supervision using rolling walker including Tylenol carpeted surfaces. Working with standing balance. Energy conservation greatly improved. Stair negotiation with bilateral hand rails and moderate assistance. Patient could sit edge of bed to don TLSO back brace with set up. She can ambulate to the bathroom rolling walker contact guard assist. She was able to remove briefs, voided bladder  and complete peri-care with supervision. Minimal assist to get brief from ankles. Bathing and dressing completed wheelchair at the same. Patient with excellent overall progress discharged to home after family teaching completed.       Disposition:  Discharged to home.   Diet: 1800-calorie ADA diet.  Special Instructions: Back brace when out of bed.  Home health nurse for intravenous Ancef 2 g every 8 hours and rifampin 300 mg by mouth 3 times  a day through 09/05/2018.  Foam dressing to right foot change every 3 days as needed soiling  Discharge medications. 1. Tylenol as needed 2. Aspirin 81 mg by mouth daily 3. Ferrous sulfate 325 mg by mouth daily 4. Lasix 40 mg by mouth daily 5. Hydrocodone 7. 5-325 milligrams 1 tab every 6 hours as needed pain 6. Anusol suppository 25 mg twice a day for hemorrhoids 7. Lantus insulin 24 units daily 8. Nizoral 2% topical twice daily applied to left toes and distal foot 9. Lisinopril 10 mg by mouth daily 10. Imodium 2 g as needed diarrhea or loose stools 11. Robaxin 500 mg by mouth every 6 hours as needed muscle spasms 12. Lopressor 25 mg by mouth twice a day 13. Multivitamin 1 tablet daily 14. Protonix 40 mg by mouth daily 15. Rifampin 300 mg by mouth 3 times a day 16. Florastor 250 mg by mouth twice a day 17. Vitamin B12 1000 g by mouth daily  Discharge Instructions    Ambulatory referral to Physical Medicine Rehab   Complete by:  As directed    Complexity follow-up 1-2 weeks MSSA discitis      Follow-up Information    Meredith Staggers, MD Follow up.   Specialty:  Physical Medicine and Rehabilitation Why:  office to call for appointment Contact information: [address] [place] [address] [phone]        Eustace Moore, MD Follow up.   Specialty:  Neurosurgery Why:  call for appointment Contact information: [address] [place] [address] [phone]        [place] Callas, NP Follow up.   Specialty:  Infectious Diseases Why:  call for appointment Contact information: [address] [place] [address] [phone]        Lelon Perla, MD Follow up.   Specialty:  Cardiology Why:  call for appointment to follow-up TEE after completion of antibiotic 09/05/2018 Contact information: [place] [address] [phone]           Signed: Lavon Paganini Wales 07/28/2018, 5:51 AM

## 2018-07-28 NOTE — Progress Notes (Signed)
Physical Therapy Discharge Summary  Patient Details  Name: Kaitlin Vargas MRN: [id number] Date of Birth: 07/26/1950  Today's Date: 07/28/2018 PT Individual Time: 7893-8101 PT Individual Time Calculation (min): 69 min   Pt performs gait throughout unit with RW and supervision. Gait in home environment and furniture transfers and bed mobility with supervision.  Ramp negotiation and car transfer to simulated sedan with RW and supervision. Stair negotiation with bilat handrails x 4 steps with min A, increased time due to fatigue.  [place] exercise program performed all exercises 2 x 10. Pt given handout of [place] exercises for HEP.  Standing balance on foam for horseshoe toss with CGA for balance.  nustep x 8 minutes level 4 for UE/LE strength and endurance. Pt states she is ready for d/c home tomorrow.  Patient has met 5 of 5 long term goals due to improved activity tolerance, improved balance, increased strength, decreased pain and ability to compensate for deficits.  Patient to discharge at an ambulatory level Supervision.   Patient's care partner is independent to provide the necessary physical assistance at discharge.  Reasons goals not met: n/a  Recommendation:  Patient will benefit from ongoing skilled PT services in home health setting to continue to advance safe functional mobility, address ongoing impairments in strength, balance, gait, and minimize fall risk.  Equipment: No equipment provided. Pt has RW at home  Reasons for discharge: treatment goals met and discharge from hospital  Patient/family agrees with progress made and goals achieved: Yes  PT Discharge Precautions/Restrictions Precautions Precautions: Fall;Back Required Braces or Orthoses: Spinal Brace Spinal Brace: Applied in sitting position Restrictions Weight Bearing Restrictions: No Pain Pain Assessment Pain Scale: 0-10 Pain Score: 4  Pain Type: Surgical pain Pain Location: Back Pain Orientation: Lower Pain  Descriptors / Indicators: Aching Pain Onset: On-going Pain Intervention(s): Repositioned   Cognition Overall Cognitive Status: Within Functional Limits for tasks assessed Arousal/Alertness: Awake/alert Orientation Level: Oriented X4 Sensation Sensation Light Touch: Impaired Detail Peripheral sensation comments: peripheral neuropathy bil feet; LT absent L toes, impaired R toes Light Touch Impaired Details: Impaired RLE;Impaired LLE Proprioception Impaired Details: Impaired RLE;Impaired LLE Coordination Gross Motor Movements are Fluid and Coordinated: Yes Fine Motor Movements are Fluid and Coordinated: Yes Motor  Motor Motor - Discharge Observations: improving strength  Mobility Bed Mobility Rolling Left: Supervision/Verbal cueing Left Sidelying to Sit: Supervision/Verbal cueing Transfers Sit to Stand: Supervision/Verbal cueing Stand to Sit: Supervision/Verbal cueing Transfer (Assistive device): Rolling walker Locomotion  Gait Ambulation: Yes Gait Assistance: Supervision/Verbal cueing Gait Distance (Feet): 350 Feet Assistive device: Rolling walker Stairs / Additional Locomotion Stairs: Yes Stairs Assistance: Minimal Assistance - Patient > 75% Stair Management Technique: Two rails Number of Stairs: 4 Ramp: Supervision/Verbal cueing Wheelchair Mobility Wheelchair Mobility: No  Trunk/Postural Assessment  Cervical Assessment Cervical Assessment: (fwd head) Thoracic Assessment Thoracic Assessment: (TLSO) Lumbar Assessment Lumbar Assessment: (TLSO) Postural Control Righting Reactions: delayed  Balance Static Sitting Balance Static Sitting - Level of Assistance: 6: Modified independent (Device/Increase time) Dynamic Sitting Balance Dynamic Sitting - Level of Assistance: 6: Modified independent (Device/Increase time) Static Standing Balance Static Standing - Level of Assistance: 5: Stand by assistance Dynamic Standing Balance Dynamic Standing - Level of Assistance: 5:  Stand by assistance Extremity Assessment      RLE Assessment General Strength Comments: grossly 3/5 LLE Assessment General Strength Comments: grossly 3/5    Sia Gabrielsen 07/28/2018, 11:35 AM

## 2018-07-28 NOTE — Progress Notes (Signed)
Occupational Therapy Session Note  Patient Details  Name: Kaitlin Vargas MRN: [id number] Date of Birth: 1950/09/23  Today's Date: 07/28/2018 OT Individual Time: 1335-1435 OT Individual Time Calculation (min): 60 min   Skilled Therapeutic Interventions/Progress Updates: Upon approach for therapy, patient voiced need to toilet.   Focus this session was on demonstrative adherence to BAT back precautions during functional tasks and transfer as she prepares for discharge home tomorrow.  Her participation was as follows: Toilet transfer via Rolling walker to regular toilet in room= distant S Toileting = distant Supervision as she held at times to walker to pericleanse, utilizing toilet aide to wrap toilet paper Tub/shower transfer in tub/shower room (nozzles on the left) via tub transfer bench and walker= distant S  At the end of the session, patient was left seated in wheelchair with alarm set and call bell within reach.     Therapy Documentation Precautions:  Precautions Precautions: Fall, Back Precaution Comments: reviewed with patient, cueing to adhere to during tasks  Required Braces or Orthoses: Spinal Brace Spinal Brace: Applied in sitting position Restrictions Weight Bearing Restrictions: No Other Position/Activity Restrictions: (RN reports she plans to follow up with orthotech )    Pain:denied  Therapy/Group: Individual Therapy  Alfredia Ferguson [facility] 07/28/2018, 2:33 PM

## 2018-07-28 NOTE — Progress Notes (Addendum)
Occupational Therapy Session Note  Patient Details  Name: Kaitlin Vargas MRN: [id number] Date of Birth: 02/21/1951  Today's Date: 07/28/2018 OT Individual Time: 1335-1435 OT Individual Time Calculation (min): 60 min    Short Term Goals: Week 1:  OT Short Term Goal 1 (Week 1): Pt will tolerate standing for 2 mins with min A to increase endurance for BADL tasks OT Short Term Goal 1 - Progress (Week 1): Met OT Short Term Goal 2 (Week 1): Pt will complete 1 step of LB dressing task OT Short Term Goal 2 - Progress (Week 1): Met OT Short Term Goal 3 (Week 1): Pt will complete stand-piovt toilet transfer with min A OT Short Term Goal 3 - Progress (Week 1): Met OT Short Term Goal 4 (Week 1): Pt will complete sit<>stand with min A OT Short Term Goal 4 - Progress (Week 1): Met  Skilled Therapeutic Interventions/Progress Updates: This session patient participated as follows:  Functional mobility/home skills.   She cooked eggs, accessed cabinets, etc demonstrating safely walker level.  She accessed the refrigerator stooping and moving without breaking back precautions, etc.  As well, she cleaned the dishes and demonstrated safe cabinet access.  She completd all of the aforementioned tasks/activities with distant S to modified indepent level.  Once back in her room, she was left seated in her w/c with safety belt engaged and call bell within reach.     Therapy Documentation Precautions:  Precautions Precautions: Fall, Back Precaution Comments: reviewed with patient, cueing to adhere to during tasks  Required Braces or Orthoses: Spinal Brace Spinal Brace: Applied in sitting position Restrictions Weight Bearing Restrictions: No Other Position/Activity Restrictions: (RN reports she plans to follow up with orthotech )    Pain:denied   Therapy/Group: Individual Therapy  ,  Ye[place] 07/28/2018, 2:41 PM  

## 2018-07-28 NOTE — Progress Notes (Signed)
Occupational Therapy Session Note  Patient Details  Name: Kaitlin Vargas MRN: [id number] Date of Birth: 02-04-1951  Today's Date: 07/28/2018 OT Individual Time: 7628-3151 OT Individual Time Calculation (min): 45 min    Short Term Goals: Week 2:  OT Short Term Goal 1 (Week 2): LTG=STG 2/2 ELOS  Skilled Therapeutic Interventions/Progress Updates:    Patient seated in w/c and ready for therapy session.  Reviewed plan for discharge and schedule for today.  Patient performed bathing seated in w/c at sink (plans to sponge bathe at home until incision is fully healed)  She required set up and use of long handled sponge with S for bathing.  Set up for UB dressing, mod A to don TLSO, CG for clothing management in stance, set up for lower body dressing.  Set up for grooming tasks, she is independent with eating.  Occ cues for safety with brace management  And back precautions during adl tasks otherwise good safety and recall of strategies noted.  Patient remained in w/c at close of session with seat alarm set and call bell in reach.    Therapy Documentation Precautions:  Precautions Precautions: Fall, Back Precaution Comments: reviewed with patient, cueing to adhere to during tasks  Required Braces or Orthoses: Spinal Brace Spinal Brace: Applied in sitting position Restrictions Weight Bearing Restrictions: No Other Position/Activity Restrictions: (RN reports she plans to follow up with orthotech ) General:   Vital Signs:   Pain: Pain Assessment Pain Scale: 0-10 Pain Score: 4  Pain Location: Back Pain Intervention(s): Repositioned   Other Treatments:     Therapy/Group: Individual Therapy  Carlos Levering 07/28/2018, 9:39 AM

## 2018-07-28 NOTE — Progress Notes (Signed)
[facility] PHYSICAL MEDICINE & REHABILITATION PROGRESS NOTE  Subjective/Complaints: Pt up in chair cleaning herself at sink. No new issues.   ROS: Patient denies fever, rash, sore throat, blurred vision, nausea, vomiting, diarrhea, cough, shortness of breath or chest pain,  headache, or mood change.    Objective: Vital Signs: Blood pressure 140/76, pulse 98, temperature 97.9 F (36.6 C), temperature source Oral, resp. rate 16, height 4\' 11"  (1.499 m), weight 72.4 kg, SpO2 98 %. No results found. No results for input(s): WBC, HGB, HCT, PLT in the last 72 hours. No results for input(s): NA, K, CL, CO2, GLUCOSE, BUN, CREATININE, CALCIUM in the last 72 hours.  Physical Exam: BP 140/76 (BP Location: Left Arm)   Pulse 98   Temp 97.9 F (36.6 C) (Oral)   Resp 16   Ht 4\' 11"  (1.499 m)   Wt 72.4 kg   SpO2 98%   BMI 32.24 kg/m     Constitutional: No distress . Vital signs reviewed. HEENT: EOMI, oral membranes moist Neck: supple Cardiovascular: RRR without murmur. No JVD    Respiratory: CTA Bilaterally without wheezes or rales. Normal effort    GI: BS +, non-tender, non-distended  Musc:  LB TTP. Neurological: She is alert.  Motor: 4/5 throughout, stable HOH  Skin: Dressing C/D/I Psychiatric:in good spirits  Assessment/Plan: 1. Functional deficits secondary to lumbar discitis/osteomyelitis with epidural abscess complicated by cardiac arrest status post decompressive lumbar laminectomy and posterior fixation which require 3+ hours per day of interdisciplinary therapy in a comprehensive inpatient rehab setting.  Physiatrist is providing close team supervision and 24 hour management of active medical problems listed below.  Physiatrist and rehab team continue to assess barriers to discharge/monitor patient progress toward functional and medical goals  Care Tool:  Bathing    Body parts bathed by patient: Right arm, Left arm, Chest, Abdomen, Left upper leg, Right upper leg, Face,  Front perineal area, Right lower leg, Left lower leg, Buttocks   Body parts bathed by helper: Buttocks     Bathing assist Assist Level: Supervision/Verbal cueing Assistive Device Comment: long handled sponge   Upper Body Dressing/Undressing Upper body dressing   What is the patient wearing?: Pull over shirt, Orthosis Orthosis activity level: Performed by helper  Upper body assist Assist Level: Set up assist    Lower Body Dressing/Undressing Lower body dressing      What is the patient wearing?: Underwear/pull up, Pants     Lower body assist Assist for lower body dressing: Contact Guard/Touching assist     Toileting Toileting    Toileting assist Assist for toileting: Supervision/Verbal cueing     Transfers Chair/bed transfer  Transfers assist     Chair/bed transfer assist level: Supervision/Verbal cueing     Locomotion Ambulation   Ambulation assist      Assist level: Supervision/Verbal cueing Assistive device: Walker-rolling Max distance: 350'   Walk 10 feet activity   Assist     Assist level: Supervision/Verbal cueing Assistive device: Walker-rolling   Walk 50 feet activity   Assist    Assist level: Supervision/Verbal cueing Assistive device: Walker-rolling    Walk 150 feet activity   Assist Walk 150 feet activity did not occur: Safety/medical concerns  Assist level: Supervision/Verbal cueing Assistive device: Walker-rolling    Walk 10 feet on uneven surface  activity   Assist Walk 10 feet on uneven surfaces activity did not occur: Safety/medical concerns   Assist level: Supervision/Verbal cueing Assistive device: Chemical engineer  Assist Will patient use wheelchair at discharge?: No Type of Wheelchair: Manual    Wheelchair assist level: Supervision/Verbal cueing Max wheelchair distance: 150    Wheelchair 50 feet with 2 turns activity    Assist        Assist Level: Supervision/Verbal cueing    Wheelchair 150 feet activity     Assist     Assist Level: Supervision/Verbal cueing      Medical Problem List and Plan: 1.  Decreased functional mobility lower extremity weakness secondary to MSSA lumbar spine discitis/osteomyelitis epidural abscess in the setting of bacteremia as well as right foot ulceration complicated by cardiac arrest.S/Pdecompressive lumbar laminectomy L-1-2 with hemi-facetectomy, posterior fixation T10-L3 07/11/2018 per Dr. Sherley Bounds. Back brace when out of bed applied in sitting position.  Cont CIR. Prep for dc tomorrow  -Patient to see Rehab MD/provider in the office for transitional care encounter in 1-2 weeks.  2.  Antithrombotics: -DVT/anticoagulation:  SCDs.  Initial vascular study to 12/08/2018 negative             -antiplatelet therapy: none 3. Pain Management: Robaxin and hydrocodone as needed.Monitor mental status secondary to narcotics  -improved pain control  -off MS contin  4. Mood: Provide emotional support             -antipsychotic agents: none 5. Neuropsych: This patient is capable of making decisions on her own behalf. 6. Skin/Wound Care:  Foam dressing to right foot change every 3 days as needed. Routine skin checks 7. Fluids/Electrolytes/Nutrition:  Routine in and out's with follow-up 8. ID/MSSA bacteremia/epidural abscess.IV Ancef 2 g every 8 hours,rifampin 300 mg every 12 hours initiated 07/11/2018 8 weeks. Follow-up infectious disease 9. Acute on chronic anemia. Patient has been transfused 2 units pack red blood cells 07/12/2018. Continue iron supplement  Hemoglobin 9.9 on 3/2  Labs ordered for tomorrow  Continue to monitor 10. Essential hypertension. Lopressor 25 mg twice a day,Lasix 40 mg daily, lisinopril 10 mg daily.   Relatively controlled on 3/9  Monitor with increased mobility 11. Diabetes mellitus with peripheral neuropathy. Hemoglobin A1c 8.7. Check blood sugars before meals and at bedtime.    Lantus 24 units  NovoLog  4 units 3 times daily started on 3/8   Improving control over weekend. Will need further titration once home 12. Urinary retention. Off urecholine.  13. B12 deficiency. Continue vitamin B-12 14.  Hyponatremia  Sodium 135 on 3/4  Labs ordered for tomorrow 3/10 15.  Hypoalbuminemia  Supplement initiated on 2/27 16.  Constipation  Improved, stools formed  -likely due to a combination of reducing narcs and addition of rifampin to abx regimen  -holding all soft/lax, stopped urecholine  -probiotic, imodium  -enocuraged ongoing po intake 17. LE edema: TEDS, elevation for now, improved  -on lasix as above  LOS: 12 days A FACE TO [place] 07/28/2018, 12:40 PM

## 2018-07-29 ENCOUNTER — Inpatient Hospital Stay (HOSPITAL_COMMUNITY): Payer: Medicare HMO | Admitting: Physical Therapy

## 2018-07-29 ENCOUNTER — Inpatient Hospital Stay (HOSPITAL_COMMUNITY): Payer: Medicare HMO | Admitting: Occupational Therapy

## 2018-07-29 LAB — BASIC METABOLIC PANEL
Anion gap: 9 (ref 5–15)
BUN: 15 mg/dL (ref 8–23)
CO2: 26 mmol/L (ref 22–32)
Calcium: 8.5 mg/dL — ABNORMAL LOW (ref 8.9–10.3)
Chloride: 95 mmol/L — ABNORMAL LOW (ref 98–111)
Creatinine, Ser: 0.45 mg/dL (ref 0.44–1.00)
GFR calc Af Amer: >60 mL/min (ref >60)
GFR calc non Af Amer: >60 mL/min (ref >60)
Glucose, Bld: 115 mg/dL — ABNORMAL HIGH (ref 70–99)
Potassium: 3.5 mmol/L (ref 3.5–5.1)
Sodium: 130 mmol/L — ABNORMAL LOW (ref 135–145)

## 2018-07-29 LAB — CBC
HCT: 29.4 % — ABNORMAL LOW (ref 36.0–46.0)
Hemoglobin: 9.7 g/dL — ABNORMAL LOW (ref 12.0–15.0)
MCH: 32.2 pg (ref 26.0–34.0)
MCHC: 33 g/dL (ref 30.0–36.0)
MCV: 97.7 fL (ref 80.0–100.0)
Platelets: 270 10*3/uL (ref 150–400)
RBC: 3.01 MIL/uL — ABNORMAL LOW (ref 3.87–5.11)
RDW: 16.6 % — ABNORMAL HIGH (ref 11.5–15.5)
WBC: 6.1 10*3/uL (ref 4.0–10.5)
nRBC: 0 % (ref 0.0–0.2)

## 2018-07-29 LAB — GLUCOSE, CAPILLARY: Glucose-Capillary: 142 mg/dL — ABNORMAL HIGH (ref 70–99)

## 2018-07-29 MED ORDER — METHOCARBAMOL 500 MG PO TABS: 500.0000 mg | ORAL_TABLET | Freq: Four times a day (QID) | ORAL | 0 refills | Status: AC | PRN

## 2018-07-29 MED ORDER — PANTOPRAZOLE SODIUM 40 MG PO TBEC: 40.0000 mg | DELAYED_RELEASE_TABLET | Freq: Every day | ORAL | 1 refills | Status: AC

## 2018-07-29 MED ORDER — HYDROCODONE-ACETAMINOPHEN 7.5-325 MG PO TABS: 1.0000 | ORAL_TABLET | Freq: Four times a day (QID) | ORAL | 0 refills | Status: DC | PRN

## 2018-07-29 MED ORDER — RIFAMPIN 300 MG PO CAPS: 300.0000 mg | ORAL_CAPSULE | Freq: Three times a day (TID) | ORAL | 0 refills | Status: AC

## 2018-07-29 MED ORDER — FERROUS SULFATE 325 (65 FE) MG PO TABS: 325.0000 mg | ORAL_TABLET | Freq: Every day | ORAL | 3 refills | Status: AC

## 2018-07-29 MED ORDER — METOPROLOL TARTRATE 25 MG PO TABS: 25.0000 mg | ORAL_TABLET | Freq: Two times a day (BID) | ORAL | 0 refills | Status: AC

## 2018-07-29 MED ORDER — INSULIN GLARGINE 100 UNITS/ML SOLOSTAR PEN: 24.0000 [IU] | PEN_INJECTOR | Freq: Every day | SUBCUTANEOUS | 11 refills | Status: AC

## 2018-07-29 MED ORDER — FUROSEMIDE 20 MG PO TABS: 40.0000 mg | ORAL_TABLET | Freq: Every day | ORAL | 1 refills | Status: DC

## 2018-07-29 MED ORDER — KETOCONAZOLE 2 % EX CREA: TOPICAL_CREAM | Freq: Two times a day (BID) | CUTANEOUS | 0 refills | Status: AC

## 2018-07-29 MED ORDER — LISINOPRIL 20 MG PO TABS: 10.0000 mg | ORAL_TABLET | Freq: Every day | ORAL | 0 refills | Status: DC

## 2018-07-29 MED ORDER — ACETAMINOPHEN 325 MG PO TABS: 650.0000 mg | ORAL_TABLET | ORAL | Status: DC | PRN

## 2018-07-29 MED ORDER — SACCHAROMYCES BOULARDII 250 MG PO CAPS: 250.0000 mg | ORAL_CAPSULE | Freq: Two times a day (BID) | ORAL | 0 refills | Status: AC

## 2018-07-29 NOTE — Plan of Care (Signed)
  Problem: Consults Goal: RH GENERAL PATIENT EDUCATION Description See Patient Education module for education specifics. Outcome: Completed/Met Goal: Skin Care Protocol Initiated - if Braden Score 18 or less Description If consults are not indicated, leave blank or document N/A Outcome: Completed/Met Goal: Diabetes Guidelines if Diabetic/Glucose > 140 Description If diabetic or lab glucose is > 140 mg/dl - Initiate Diabetes/Hyperglycemia Guidelines & Document Interventions  Outcome: Completed/Met Goal: RH GENERAL PATIENT EDUCATION Description See Patient Education module for education specifics. Outcome: Completed/Met   Problem: RH BOWEL ELIMINATION Goal: RH STG MANAGE BOWEL WITH ASSISTANCE Description STG Manage Bowel with mod I Assistance.  Outcome: Completed/Met Goal: RH STG MANAGE BOWEL W/MEDICATION W/ASSISTANCE Description STG Manage Bowel with Medication with mod I Assistance.  Outcome: Completed/Met   Problem: RH SKIN INTEGRITY Goal: RH STG SKIN FREE OF INFECTION/BREAKDOWN Description Incision site to remain free from infection, bottom to remain free from skin breakdown with mod I assistance.  Outcome: Completed/Met Goal: RH STG MAINTAIN SKIN INTEGRITY WITH ASSISTANCE Description STG Maintain Skin Integrity With mod I Assistance.  Outcome: Completed/Met Goal: RH STG ABLE TO PERFORM INCISION/WOUND CARE W/ASSISTANCE Description STG Able To Perform Incision and Wound Care With mod I Assistance.  Outcome: Completed/Met   Problem: RH SAFETY Goal: RH STG ADHERE TO SAFETY PRECAUTIONS W/ASSISTANCE/DEVICE Description STG Adhere to Safety Precautions With TLSO brace donned at the bedside and min Assistance and ambulating with the appropriate assistive Device.  Outcome: Completed/Met   Problem: RH PAIN MANAGEMENT Goal: RH STG PAIN MANAGED AT OR BELOW PT'S PAIN GOAL Description <3 on a 0-10 pain scale.  Outcome: Completed/Met   Problem: RH KNOWLEDGE DEFICIT  GENERAL Goal: RH STG INCREASE KNOWLEDGE OF SELF CARE AFTER HOSPITALIZATION Description Patient will demonstrate how to manage medications, how to don TLSO brace, how to ambulate safely with the rolling walker, and follow-up care with the MD post discharge with min assist from rehab staff.  Outcome: Completed/Met

## 2018-07-29 NOTE — Progress Notes (Signed)
Pt resting Norco with relief for pain. Pt's PICC line intact with antimicrobial disc in place. Pt continent of B/B. Resting

## 2018-07-29 NOTE — Discharge Instructions (Signed)
Inpatient Rehab Discharge Instructions  [facility] Discharge date and time: No discharge date for patient encounter.   Activities/Precautions/ Functional Status: Activity: back brace as directed Diet: diabetic diet Wound Care: keep wound clean and dry Functional status:  ___ No restrictions     ___ Walk up steps independently ___ 24/7 supervision/assistance   ___ Walk up steps with assistance ___ Intermittent supervision/assistance  ___ Bathe/dress independently ___ Walk with walker     _x__ Bathe/dress with assistance ___ Walk Independently    ___ Shower independently ___ Walk with assistance    ___ Shower with assistance ___ No alcohol     ___ Return to work/school ________  Special Instructions: Foam dressing to right foot every 3 days changes needed  Intravenous Ancef 2 g every 8 hours, rifampin 300 mg every 12 hours until 09/05/2018 and stop. Follow-up TEE after completion of antibiotic   COMMUNITY REFERRALS UPON DISCHARGE:    Home Health:   PT, OT, [place]   Date of last service:07/29/2018  Medical Equipment/Items Finger  Agency/Supplier:[facility] [phone]    My questions have been answered and I understand these instructions. I will adhere to these goals and the provided educational materials after my discharge from the hospital.  Patient/Caregiver Signature _______________________________ Date __________  Clinician Signature _______________________________________ Date __________  Please bring this form and your medication list with you to all your follow-up doctor's appointments.

## 2018-07-29 NOTE — Progress Notes (Signed)
Social Work  Discharge Note  The overall goal for the admission was met for:   Discharge location: Yes - home with spouse who can provide 24/7 assistance  Length of Stay: Yes - 13 days  Discharge activity level: Yes - supervision  Home/community participation: Yes  Services provided included: MD, RD, PT, OT, RN, Pharmacy, [place]: [employer]  Follow-up services arranged: Home Health: RN, PT, OT via [employer], DME: 16x16 lightweight/ hemi w/c, cushion, tub bench via [place] and Patient/Family has no preference for HH/DME agencies  Comments (or additional information):  Patient/Family verbalized understanding of follow-up arrangements: Yes  Individual responsible for coordination of the follow-up plan: pt  Confirmed correct DME delivered: Marcellene Shivley 07/29/2018    [place]

## 2018-07-29 NOTE — Progress Notes (Signed)
Discharge instructions given to patient  And husband by Linna Hoff A. Physician Assistant. No questions noted at time. Patient waiting for equipment to discharge. Adria Devon, LPN

## 2018-07-29 NOTE — Progress Notes (Signed)
Responded to consult for PICC line flush and cap. On arrival, PICC line already saline locked. Cleared consult.

## 2018-07-29 NOTE — Progress Notes (Addendum)
[place] PHYSICAL MEDICINE & REHABILITATION PROGRESS NOTE  Subjective/Complaints: Patient sitting up in chair eating breakfast.  Excited about going home today.  No new complaints.  ROS: Patient denies fever, rash, sore throat, blurred vision, nausea, vomiting, diarrhea, cough, shortness of breath or chest pain,   headache, or mood change.   Objective: Vital Signs: Blood pressure 112/80, pulse (!) 107, temperature 98 F (36.7 C), temperature source Oral, resp. rate 18, height 4\' 11"  (1.499 m), weight 72.4 kg, SpO2 100 %. No results found. Recent Labs    07/29/18 0337  WBC 6.1  HGB 9.7*  HCT 29.4*  PLT 270   Recent Labs    07/29/18 0337  NA 130*  K 3.5  CL 95*  CO2 26  GLUCOSE 115*  BUN 15  CREATININE 0.45  CALCIUM 8.5*    Physical Exam: BP 112/80 (BP Location: Left Arm)   Pulse (!) 107   Temp 98 F (36.7 C) (Oral)   Resp 18   Ht 4\' 11"  (1.499 m)   Wt 72.4 kg   SpO2 100%   BMI 32.24 kg/m     Constitutional: No distress . Vital signs reviewed. HEENT: EOMI, oral membranes moist Neck: supple Cardiovascular: RRR without murmur. No JVD    Respiratory: CTA Bilaterally without wheezes or rales. Normal effort    GI: BS +, non-tender, non-distended  Musc:  LB TTP. Neurological: She is alert.  Motor: 4/5 throughout, stable HOH  Skin: Dressing remains C/D/I [facility]  Assessment/Plan: 1. Functional deficits secondary to lumbar discitis/osteomyelitis with epidural abscess complicated by cardiac arrest status post decompressive lumbar laminectomy and posterior fixation which require 3+ hours per day of interdisciplinary therapy in a comprehensive inpatient rehab setting.  Physiatrist is providing close team supervision and 24 hour management of active medical problems listed below.  Physiatrist and rehab team continue to assess barriers to discharge/monitor patient progress toward functional and medical goals  Care Tool:  Bathing    Body parts  bathed by patient: Right arm, Left arm, Chest, Abdomen, Left upper leg, Right upper leg, Face, Front perineal area, Right lower leg, Left lower leg, Buttocks   Body parts bathed by helper: Buttocks     Bathing assist Assist Level: Supervision/Verbal cueing Assistive Device Comment: long handled sponge   Upper Body Dressing/Undressing Upper body dressing   What is the patient wearing?: Pull over shirt, Orthosis Orthosis activity level: Performed by helper  Upper body assist Assist Level: Set up assist    Lower Body Dressing/Undressing Lower body dressing      What is the patient wearing?: Underwear/pull up, Pants     Lower body assist Assist for lower body dressing: Contact Guard/Touching assist     Toileting Toileting    Toileting assist Assist for toileting: Supervision/Verbal cueing     Transfers Chair/bed transfer  Transfers assist     Chair/bed transfer assist level: Supervision/Verbal cueing     Locomotion Ambulation   Ambulation assist      Assist level: Supervision/Verbal cueing Assistive device: Walker-rolling Max distance: 350'   Walk 10 feet activity   Assist     Assist level: Supervision/Verbal cueing Assistive device: Walker-rolling   Walk 50 feet activity   Assist    Assist level: Supervision/Verbal cueing Assistive device: Walker-rolling    Walk 150 feet activity   Assist Walk 150 feet activity did not occur: Safety/medical concerns  Assist level: Supervision/Verbal cueing Assistive device: Walker-rolling    Walk 10 feet on uneven surface  activity  Assist Walk 10 feet on uneven surfaces activity did not occur: Safety/medical concerns   Assist level: Supervision/Verbal cueing Assistive device: Aeronautical engineer Will patient use wheelchair at discharge?: No Type of Wheelchair: Manual    Wheelchair assist level: Supervision/Verbal cueing Max wheelchair distance: 150    Wheelchair 50  feet with 2 turns activity    Assist        Assist Level: Supervision/Verbal cueing   Wheelchair 150 feet activity     Assist     Assist Level: Supervision/Verbal cueing      Medical Problem List and Plan: 1.  Decreased functional mobility lower extremity weakness secondary to MSSA lumbar spine discitis/osteomyelitis epidural abscess in the setting of bacteremia as well as right foot ulceration complicated by cardiac arrest.S/Pdecompressive lumbar laminectomy L-1-2 with hemi-facetectomy, posterior fixation T10-L3 07/11/2018 per Dr. Sherley Bounds. Back brace when out of bed applied in sitting position.  Discharge home today  -Patient to see Rehab MD/provider in the office for transitional care encounter in 1-2 weeks.  2.  Antithrombotics: -DVT/anticoagulation:  SCDs.  Initial vascular study to 12/08/2018 negative             -antiplatelet therapy: none 3. Pain Management: Robaxin and hydrocodone as needed.Monitor mental status secondary to narcotics  -improved pain control   4. Mood: Provide emotional support             -antipsychotic agents: none 5. Neuropsych: This patient is capable of making decisions on her own behalf. 6. Skin/Wound Care:  Foam dressing to right foot change every 3 days as needed. Routine skin checks 7. Fluids/Electrolytes/Nutrition:   Labs reviewed.  Sodium slightly low but not far from baseline which is ranged from 139-135  -Follow-up as outpatient 8. ID/MSSA bacteremia/epidural abscess.IV Ancef 2 g every 8 hours,rifampin 300 mg every 12 hours initiated 07/11/2018 8 weeks. Follow-up infectious disease 9. Acute on chronic anemia. Patient has been transfused 2 units pack red blood cells 07/12/2018. Continue iron supplement  Hemoglobin holding at 9.7 today  Follow-up as outpatient 10. Essential hypertension. Lopressor 25 mg twice a day,Lasix 40 mg daily, lisinopril 10 mg daily.   Relatively controlled on 3/10  Monitor with increased mobility 11.  Diabetes mellitus with peripheral neuropathy. Hemoglobin A1c 8.7. Check blood sugars before meals and at bedtime.    Lantus 24 units  NovoLog 4 units 3 times daily started on 3/8   Improving control over weekend. Will need further titration once home 12. Urinary retention. Off urecholine.  13. B12 deficiency. Continue vitamin B-12 14.  Hyponatremia  See above 15.  Hypoalbuminemia  Supplement initiated on 2/27 16.  Constipation  Improved, stools formed  -likely due to a combination of reducing narcs and addition of rifampin to abx regimen  -holding all soft/lax, stopped urecholine  -probiotic, imodium  -enocuraged ongoing po intake, discussed potential home regimen with patient 17. LE edema: TEDS, elevation for now, improved  -on lasix as above  LOS: 13 days A FACE TO [place] 07/29/2018, 8:43 AM

## 2018-07-30 ENCOUNTER — Inpatient Hospital Stay (HOSPITAL_COMMUNITY): Payer: Medicare HMO | Admitting: Physical Therapy

## 2018-07-30 ENCOUNTER — Telehealth: Payer: Self-pay | Admitting: *Deleted

## 2018-07-30 NOTE — Telephone Encounter (Signed)
Cheryl RN [facility] called to verify that Dr Naaman Plummer will be signing orders.  Kaitlin Vargas is to receive IV abx q 8. Please advise if you will be signing these orders.

## 2018-07-31 ENCOUNTER — Telehealth: Payer: Self-pay | Admitting: Registered Nurse

## 2018-07-31 NOTE — Telephone Encounter (Signed)
Transitional Care call Transitional Care Call Completed  Patient name: Kaitlin Vargas  DOB: [birth year] 1. Are you/is patient experiencing any problems since coming home? No a. Are there any questions regarding any aspect of care? No 2. Are there any questions regarding medications administration/dosing? No a. Are meds being taken as prescribed? Yes b. "Patient should review meds with caller to confirm" Medication List Reviewed 3. Have there been any falls? No 4. Has Home Health been to the house and/or have they contacted you? Yes, [facility] a. If not, have you tried to contact them? NA b. Can we help you contact them? NA 5. Are bowels and bladder emptying properly? Yes a. Are there any unexpected incontinence issues? No b. If applicable, is patient following bowel/bladder programs? NA 6. Any fevers, problems with breathing, unexpected pain? No 7. Are there any skin problems or new areas of breakdown? No 8. Has the patient/family member arranged specialty MD follow up (ie cardiology/neurology/renal/surgical/etc.)?  [name] instructed to call Dr. Ronnald Ramp and Dr. Stanford Breed, she verbalizes understanding.  a. Can we help arrange? NA 9. Does the patient need any other services or support that we can help arrange? No 10. Are caregivers following through as expected in assisting the patient? Yes 11. Has the patient quit smoking, drinking alcohol, or using drugs as recommended? [name] states she doesn't smoke, drink alcohol or use illicit drugs.   Appointment date/time 08/12/2018  arrival time 10:20 for 10:40 appointment with Dr. Naaman Plummer. At [place]

## 2018-07-31 NOTE — Telephone Encounter (Signed)
I'll sign them

## 2018-07-31 NOTE — Telephone Encounter (Signed)
Kaitlin Vargas has been notified. 

## 2018-08-11 ENCOUNTER — Encounter: Payer: Self-pay | Admitting: Physical Medicine & Rehabilitation

## 2018-08-12 ENCOUNTER — Encounter: Payer: Medicare HMO | Admitting: Physical Medicine & Rehabilitation

## 2018-08-13 DIAGNOSIS — Z794 Long term (current) use of insulin: Secondary | ICD-10-CM

## 2018-08-13 DIAGNOSIS — E1142 Type 2 diabetes mellitus with diabetic polyneuropathy: Secondary | ICD-10-CM

## 2018-08-13 DIAGNOSIS — B9561 Methicillin susceptible Staphylococcus aureus infection as the cause of diseases classified elsewhere: Secondary | ICD-10-CM | POA: Diagnosis not present

## 2018-08-13 DIAGNOSIS — G061 Intraspinal abscess and granuloma: Secondary | ICD-10-CM

## 2018-08-13 DIAGNOSIS — Z5181 Encounter for therapeutic drug level monitoring: Secondary | ICD-10-CM

## 2018-08-13 DIAGNOSIS — M4626 Osteomyelitis of vertebra, lumbar region: Secondary | ICD-10-CM

## 2018-08-13 DIAGNOSIS — Z792 Long term (current) use of antibiotics: Secondary | ICD-10-CM

## 2018-08-13 DIAGNOSIS — Z4789 Encounter for other orthopedic aftercare: Secondary | ICD-10-CM

## 2018-08-13 DIAGNOSIS — Z452 Encounter for adjustment and management of vascular access device: Secondary | ICD-10-CM

## 2018-08-28 ENCOUNTER — Telehealth: Payer: Self-pay

## 2018-08-28 ENCOUNTER — Other Ambulatory Visit: Payer: Self-pay

## 2018-08-28 ENCOUNTER — Ambulatory Visit (INDEPENDENT_AMBULATORY_CARE_PROVIDER_SITE_OTHER): Payer: Medicare HMO | Admitting: Family

## 2018-08-28 ENCOUNTER — Encounter: Payer: Self-pay | Admitting: Family

## 2018-08-28 DIAGNOSIS — I358 Other nonrheumatic aortic valve disorders: Secondary | ICD-10-CM | POA: Diagnosis not present

## 2018-08-28 DIAGNOSIS — M4646 Discitis, unspecified, lumbar region: Secondary | ICD-10-CM

## 2018-08-28 DIAGNOSIS — Z981 Arthrodesis status: Secondary | ICD-10-CM | POA: Diagnosis not present

## 2018-08-28 DIAGNOSIS — R7881 Bacteremia: Secondary | ICD-10-CM

## 2018-08-28 MED ORDER — CEPHALEXIN 500 MG PO CAPS: 500.0000 mg | ORAL_CAPSULE | Freq: Three times a day (TID) | ORAL | 0 refills | Status: DC

## 2018-08-28 NOTE — Assessment & Plan Note (Signed)
Kaitlin Vargas has MSSA lumbar discitis and epidural abscess s/p laminectomy and fusion and approximately 6 weeks into treatment with Cefazolin and rifampin. She is doing well with good tolerance and adherence to the regimen. Back pain is significantly improved since leaving the hospital and she currently requires rare pain medication and able to complete her activities of daily living without complication. She reports no problems with her PICC line or signs of infection. Inflammatory markers are within normal ranges. Will plan to continue current dose of cefazolin until 09/05/18 and transition to cephalexin for 1 month in combination with rifampin. Follow up office visit in 1 month following completion of antibiotic therapy or sooner if needed.

## 2018-08-28 NOTE — Patient Instructions (Signed)
Nice to speak with you today.  Please continue to take the Cefazolin and Rifampin through April 17th.   At that time please start taking the cephalexin and continue to take the Rifampin. We will tentatively plan to treat you for 1 month at the completion of your IV therapy.  We will get you scheduled for follow up testing at your next office visit.  Please let us know if you have any questions.  Have a great day!

## 2018-08-28 NOTE — Telephone Encounter (Signed)
Called [facility], spoke to [place] with new order per Terri Piedra, Stop IV antibiotics on 09/05/2018 and to have the PICC lline removed after last dose.   Debbie expressed understanding with feedback.  Patient is aware of new plan of care.   Lenore Cordia, [place]

## 2018-08-28 NOTE — Assessment & Plan Note (Signed)
Blood cultures previously cleared and currently asymptomatic with 2 weeks of treatment remaining for endocarditis and discitis.

## 2018-08-28 NOTE — Progress Notes (Signed)
Subjective:    Patient ID: Kaitlin Vargas, female    DOB: Aug 22, 1950, 68 y.o.   MRN: [id number]  Chief Complaint  Patient presents with   evisit    doing well, IV/oral antibiotics going well     Virtual Visit via Telephone Note   I connected with Kaitlin Vargas at 9:05 am on 08/28/2018  by telephone and verified that I am speaking with the correct person using two identifiers.   I discussed the limitations, risks, security and privacy concerns of performing an evaluation and management service by telephone and the availability of in person appointments. I also discussed with the patient that there may be a patient responsible charge related to this service. The patient expressed understanding and agreed to proceed.   HPI:  Kaitlin Vargas is a 68 y.o. female with previous medical history of Type 2 diabetes, hypertension and anemia who initially presented to [facility] ER on 1/27 with 4 week history of increasing back pain and weakness. Work up revealed MRI with L1-L2 discitis-osteomyelitis with extensive phelgmon and abscess in the right-sided paravertebral sofift tissues. Blood cultures were positive in 3/4 bottles for MSSA bacteremia. She underwent IR guided aspiration with gram stain showing gram positive cocci and cultures positive for MSSA. There were reports of brief cardiac arrest with induction of sedation requiring 90 seconds of CPR. TTE without evidence of vegetation or structural/valve abnormality with the plan for 8 weeks of Cefazolin. She was discharged to rehabilitation, however this was complicated worsening back pain on 07/09/18 at approximately 3 weeks into the course of therapy. Neurosurgery performed laminectomy and fusion on 2/21 with the addition of hardware. Intra-operative cultures with MSSA. New TEE with evidence of AV endocarditis which was a new finding. New plan for Cefazolin and rifampin for 8 weeks with end date of 09/05/2018. Home Health blood work reviewed with inflammatory  markers showing:  08/04/18 - CRP 8; ESR 80 08/18/18 - CRP 2; ESR 40  All hospital labs, records, and imaging were reviewed in detail.  Kaitlin Vargas has been receiving her Cefazolin and rifampin as prescribed with no adverse side effects or missed doses. Back is getting progressively better and has not had to take any pain medication recently. No fevers, chills, or sweats. No problems with the PICC line and denies any redness or drainage. She is able to complete her activities of daily living without problems.     Allergies  Allergen Reactions   Sulfa Antibiotics Rash      Outpatient Medications Prior to Visit  Medication Sig Dispense Refill   acetaminophen (TYLENOL) 325 MG tablet Take 2 tablets (650 mg total) by mouth every 4 (four) hours as needed for mild pain ((score 1 to 3) or temp > 100.5).     aspirin 81 MG chewable tablet Chew 81 mg by mouth every morning.      ceFAZolin (ANCEF) IVPB Inject 2 g into the vein every 8 (eight) hours. Indication: MSSA L-spine discitis Last Day of Therapy:  09/05/2018 Labs - Once weekly:  CBC/D and BMP, Labs - Every other week:  ESR and CRP 153 Units 0   ferrous sulfate 325 (65 FE) MG tablet Take 1 tablet (325 mg total) by mouth daily with breakfast. 30 tablet 3   furosemide (LASIX) 20 MG tablet Take 2 tablets (40 mg total) by mouth daily. 30 tablet 1   insulin glargine (LANTUS) 100 unit/mL SOPN Inject 0.24 mLs (24 Units total) into the skin daily. 15 mL 11  ketoconazole (NIZORAL) 2 % cream Apply topically 2 (two) times daily. Apply to affected area 15 g 0   lisinopril (PRINIVIL,ZESTRIL) 20 MG tablet Take 0.5 tablets (10 mg total) by mouth daily. 30 tablet 0   metoprolol tartrate (LOPRESSOR) 25 MG tablet Take 1 tablet (25 mg total) by mouth 2 (two) times daily. 60 tablet 0   Multiple Vitamin (THERA) TABS Take 1 tablet by mouth daily.      pantoprazole (PROTONIX) 40 MG tablet Take 1 tablet (40 mg total) by mouth daily. 30 tablet 1    pioglitazone (ACTOS) 30 MG tablet TAKE ONE TABLET (30 MG TOTAL) BY MOUTH DAILY.     rifampin (RIFADIN) 300 MG capsule Take 1 capsule (300 mg total) by mouth 3 (three) times daily. 153 capsule 0   saccharomyces boulardii (FLORASTOR) 250 MG capsule Take 1 capsule (250 mg total) by mouth 2 (two) times daily. 60 capsule 0   vitamin B-12 (CYANOCOBALAMIN) 1000 MCG tablet Take by mouth.     HYDROcodone-acetaminophen (NORCO) 7.5-325 MG tablet Take 1 tablet by mouth every 6 (six) hours as needed for moderate pain. (Patient not taking: Reported on 08/28/2018) 30 tablet 0   insulin glargine (LANTUS) 100 unit/mL SOPN Inject 0.24 mLs (24 Units total) into the skin daily. 15 mL 11   methocarbamol (ROBAXIN) 500 MG tablet Take 1 tablet (500 mg total) by mouth every 6 (six) hours as needed for muscle spasms. (Patient not taking: Reported on 08/28/2018) 60 tablet 0   senna-docusate (SENOKOT-S) 8.6-50 MG tablet Take 2 tablets by mouth at bedtime. (Patient not taking: Reported on 08/28/2018)     No facility-administered medications prior to visit.      Past Medical History:  Diagnosis Date   Anemia    Diabetes mellitus without complication ([place])    Hyperlipidemia    Hypertension    Peripheral neuropathy      Past Surgical History:  Procedure Laterality Date   ABDOMINAL HYSTERECTOMY     IR LUMBAR DISC ASPIRATION W/IMG GUIDE  06/18/2018   POSTERIOR LUMBAR FUSION 4 LEVEL N/A 07/11/2018   Procedure: Thoracic ten-Lumbar three Fusion with instrumentation-Lumbar one-two Decompressive Laminectomy ;  Surgeon: Eustace Moore, MD;  Location: [place];  Service: Neurosurgery;  Laterality: N/A;   TEE WITHOUT CARDIOVERSION N/A 07/15/2018   Procedure: TRANSESOPHAGEAL ECHOCARDIOGRAM (TEE);  Surgeon: Lelon Perla, MD;  Location: [facility] ENDOSCOPY;  Service: Cardiovascular;  Laterality: N/A;       Review of Systems  Constitutional: Negative for chills, fatigue and fever.  Respiratory: Negative for cough, chest  tightness, shortness of breath and wheezing.   Gastrointestinal: Negative for constipation, diarrhea, nausea and vomiting.  Musculoskeletal: Negative for back pain, neck pain and neck stiffness.  Skin: Negative for rash.  Neurological: Negative for weakness, numbness and headaches.      Objective:    There were no vitals taken for this visit. Nursing note and vital signs reviewed.  Physical Exam    Phone visit.  Ms. Botelho is pleasant and sounds to be doing well with no current pain and able to complete her activities of daily living without assistance. She is not requiring pain medication at this time.  Assessment & Plan:   Problem List Items Addressed This Visit      Cardiovascular and Mediastinum   Aortic valve endocarditis    Mobile vegetation noted on aortic valve on TEE performed on 2/25 with recommendation for follow up TEE at the completion of treatment. Currently asymptomatic. Will plan to  recheck outpatient TEE when able due to restrictions associated with coronavirus.         Musculoskeletal and Integument   Lumbar discitis - Primary    Ms. Arpin has MSSA lumbar discitis and epidural abscess s/p laminectomy and fusion and approximately 6 weeks into treatment with Cefazolin and rifampin. She is doing well with good tolerance and adherence to the regimen. Back pain is significantly improved since leaving the hospital and she currently requires rare pain medication and able to complete her activities of daily living without complication. She reports no problems with her PICC line or signs of infection. Inflammatory markers are within normal ranges. Will plan to continue current dose of cefazolin until 09/05/18 and transition to cephalexin for 1 month in combination with rifampin. Follow up office visit in 1 month following completion of antibiotic therapy or sooner if needed.         Other   MSSA bacteremia    Blood cultures previously cleared and currently asymptomatic with 2  weeks of treatment remaining for endocarditis and discitis.       S/P lumbar fusion       I am having [facility] start on cephALEXin. I am also having her maintain her aspirin, Thera, senna-docusate, ceFAZolin, acetaminophen, ferrous sulfate, furosemide, HYDROcodone-acetaminophen, ketoconazole, methocarbamol, metoprolol tartrate, pantoprazole, rifampin, saccharomyces boulardii, insulin glargine, insulin glargine, lisinopril, vitamin B-12, and pioglitazone.   Meds ordered this encounter  Medications   cephALEXin (KEFLEX) 500 MG capsule    Sig: Take 1 capsule (500 mg total) by mouth 3 (three) times daily. Start on 09/06/2018.    Dispense:  90 capsule    Refill:  0    To start on 09/06/2018 at the completion of Cefazolin    Order Specific Question:   Supervising Provider    Answer:   Carlyle Basques [phone]     I discussed the assessment and treatment plan with the patient. The patient was provided an opportunity to ask questions and all were answered. The patient agreed with the plan and demonstrated an understanding of the instructions.   The patient was advised to call back or seek an in-person evaluation if the symptoms worsen or if the condition fails to improve as anticipated.   I provided 18  minutes of non-face-to-face time during this encounter.  Follow-up: Return in about 1 month (around 09/27/2018), or if symptoms worsen or fail to improve.   Terri Piedra, MSN, FNP-C Nurse Practitioner [facility] [place] number: [phone]

## 2018-08-28 NOTE — Assessment & Plan Note (Signed)
Mobile vegetation noted on aortic valve on TEE performed on 2/25 with recommendation for follow up TEE at the completion of treatment. Currently asymptomatic. Will plan to recheck outpatient TEE when able due to restrictions associated with coronavirus.

## 2018-09-10 ENCOUNTER — Encounter: Payer: Self-pay | Admitting: Physical Medicine & Rehabilitation

## 2018-09-10 ENCOUNTER — Encounter: Payer: Medicare HMO | Attending: Physical Medicine & Rehabilitation | Admitting: Physical Medicine & Rehabilitation

## 2018-09-10 ENCOUNTER — Other Ambulatory Visit: Payer: Self-pay

## 2018-09-10 VITALS — BP 138/76 | Ht 59.0 in | Wt 160.0 lb

## 2018-09-10 DIAGNOSIS — I10 Essential (primary) hypertension: Secondary | ICD-10-CM | POA: Diagnosis not present

## 2018-09-10 MED ORDER — LISINOPRIL 20 MG PO TABS: 10.0000 mg | ORAL_TABLET | Freq: Every day | ORAL | 2 refills | Status: AC

## 2018-09-10 MED ORDER — FUROSEMIDE 20 MG PO TABS: 20.0000 mg | ORAL_TABLET | Freq: Every day | ORAL | 2 refills | Status: AC

## 2018-09-10 NOTE — Progress Notes (Signed)
Subjective:    Patient ID: Kaitlin Vargas, female    DOB: 01-Apr-1951, 68 y.o.   MRN: [id number]  HPI  Due to national recommendations of social distancing because of COVID 2, an audio/video tele-health visit is felt to be the most appropriate encounter for this patient at this time. See MyChart message from today for the patient's consent to a tele-health encounter with [facility]. This is a follow up telephone visit for the patient who is at home. MD is at office.    I am meeting with the patient today regarding MSSA bacteremia/discitis. HH therapies has completed. She is walking with a walker or cane. She uses the walker typically when she feels tired or if her feet are swollen. she feels pretty steady on her feet for the most part although her neuropathy can affect balance. She has been seen by Dr. Ronnald Ramp who has been pleased with her progress  Her pain is minimal. She really is no longer taking medicatoin for pain. She is emptying her bladder and bowels without issues. Skin is clean and dry..   She had her blood checked at home by [facility] and was told she was anemic. No new orders were written.   Pain Inventory Average Pain 0 Pain Right Now 0 My pain is na  In the last 24 hours, has pain interfered with the following? General activity 0 Relation with others 0 Enjoyment of life 0 What TIME of day is your pain at its worst? na Sleep (in general) Good  Pain is worse with: na Pain improves with: na Relief from Meds: na  Mobility walk with assistance use a walker ability to climb steps?  yes do you drive?  no  Function disabled: date disabled na  Neuro/Psych trouble walking  Prior Studies Any changes since last visit?  yes  Physicians involved in your care Any changes since last visit?  yes   Family History  Problem Relation Age of Onset  . Cancer Mother   . Stroke Mother   . Cancer Father   . Leukemia Father    Social History    Socioeconomic History  . Marital status: Married    Spouse name: Not on file  . Number of children: 2  . Years of education: Not on file  . Highest education level: Not on file  Occupational History  . Occupation: Retired Surveyor, minerals  Social Needs  . Financial resource strain: Somewhat hard  . Food insecurity:    Worry: Never true    Inability: Never true  . Transportation needs:    Medical: Patient refused    Non-medical: Patient refused  Tobacco Use  . Smoking status: Never Smoker  . Smokeless tobacco: Never Used  Substance and Sexual Activity  . Alcohol use: Never    Frequency: Never  . Drug use: Never  . Sexual activity: Not Currently  Lifestyle  . Physical activity:    Days per week: Patient refused    Minutes per session: Patient refused  . Stress: To some extent  Relationships  . Social connections:    Talks on phone: Patient refused    Gets together: Patient refused    Attends religious service: Patient refused    Active member of club or organization: Patient refused    Attends meetings of clubs or organizations: Patient refused    Relationship status: Patient refused  Other Topics Concern  . Not on file  Social History Narrative  . Not on  file   Past Surgical History:  Procedure Laterality Date  . ABDOMINAL HYSTERECTOMY    . IR LUMBAR DISC ASPIRATION W/IMG GUIDE  06/18/2018  . POSTERIOR LUMBAR FUSION 4 LEVEL N/A 07/11/2018   Procedure: Thoracic ten-Lumbar three Fusion with instrumentation-Lumbar one-two Decompressive Laminectomy ;  Surgeon: Eustace Moore, MD;  Location: [place];  Service: Neurosurgery;  Laterality: N/A;  . TEE WITHOUT CARDIOVERSION N/A 07/15/2018   Procedure: TRANSESOPHAGEAL ECHOCARDIOGRAM (TEE);  Surgeon: Lelon Perla, MD;  Location: [facility] ENDOSCOPY;  Service: Cardiovascular;  Laterality: N/A;   Past Medical History:  Diagnosis Date  . Anemia   . Diabetes mellitus without complication ([place])   . Hyperlipidemia   . Hypertension   .  Peripheral neuropathy    BP 138/76 Comment: pt reported virtual visit  Ht 4\' 11"  (1.499 m) Comment: pt reported virtual visit  Wt 160 lb (72.6 kg) Comment: pt reported virtual visit  BMI 32.32 kg/m   Opioid Risk Score:   Fall Risk Score:  `1  Depression screen PHQ 2/9  Depression screen [facility] 2/9 09/10/2018 08/28/2018  Decreased Interest 0 0  Down, Depressed, Hopeless 0 0  PHQ - 2 Score 0 0    Review of Systems  Constitutional: Negative.   HENT: Positive for sneezing.   Eyes: Negative.   Respiratory: Negative.   Cardiovascular: Negative.   Gastrointestinal: Negative.   Endocrine: Negative.   Genitourinary: Negative.   Musculoskeletal: Negative.   Skin: Negative.   Allergic/Immunologic: Negative.   Neurological: Negative.   Hematological: Negative.   Psychiatric/Behavioral: Negative.   All other systems reviewed and are negative.       Assessment & Plan:  1.  MSSA lumbar spine discitis/osteomyelitis epidural abscess in the setting of bacteremia as well as right foot ulceration complicated by cardiac arrest.S/Pdecompressive lumbar laminectomy L-1-2 with hemi-facetectomy, posterior fixation T10-L3 07/11/2018 per Dr. Sherley Bounds. Back brace when out of bed applied in sitting position.              -continue HEP  -she is doing well enough that we won't need to pursue outpt therapies 2.  Pain: tylenol prn. Doing very well! 3. Anemia: fe supp. Follow up with primary re: repeat CBC   -acute blood loss and chronic disease related  -discussed diet as well 4. Mood: in good spirits 5. LE Edema: continue lasix but reduce to 20mg  daily  -she will discuss with primary regarding stopping completely when she sees him in June 6. Skin/Wound Care: local care   11 minutes of tele-visit time was spent with this patient today. Follow up as needed

## 2018-10-02 ENCOUNTER — Ambulatory Visit (INDEPENDENT_AMBULATORY_CARE_PROVIDER_SITE_OTHER): Payer: Medicare HMO | Admitting: Family

## 2018-10-02 ENCOUNTER — Other Ambulatory Visit: Payer: Self-pay

## 2018-10-02 ENCOUNTER — Encounter: Payer: Self-pay | Admitting: Family

## 2018-10-02 DIAGNOSIS — R7881 Bacteremia: Secondary | ICD-10-CM | POA: Diagnosis not present

## 2018-10-02 DIAGNOSIS — M4646 Discitis, unspecified, lumbar region: Secondary | ICD-10-CM

## 2018-10-02 DIAGNOSIS — I358 Other nonrheumatic aortic valve disorders: Secondary | ICD-10-CM

## 2018-10-02 NOTE — Assessment & Plan Note (Signed)
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 Will recheck TTE. No need for additional antibiotic therapy at this time.

## 2018-10-02 NOTE — Progress Notes (Signed)
Subjective:    Patient ID: Kaitlin Vargas, female    DOB: Jul 27, 1950, 68 y.o.   MRN: [id number]  Chief Complaint  Patient presents with  . evisit    when are oral antibiotics complete?     Virtual Visit via Telephone Note   I connected with Kaitlin Vargas on 10/02/2018 at 10:!5 AM  by telephone and verified that I am speaking with the correct person using two identifiers.   I discussed the limitations, risks, security and privacy concerns of performing an evaluation and management service by telephone and the availability of in person appointments. I also discussed with the patient that there may be a patient responsible charge related to this service. The patient expressed understanding and agreed to proceed.   HPI:  Kaitlin Vargas is a 68 y.o. female with disseminated MSSA infection including bacteremia, lumbar discitis s/p fusion, and aortic valve endocarditis who was last seen in the office on 08/28/2018 for follow up completing her 6 weeks of IV therapy with Cefazolin and rifampin. Inflammatory markers with within normal ranges and was transitioned to cephalexin and rifampin.   Kaitlin Vargas has been taking her antibiotics as prescribed without adverse side effects or missed doses. She has a couple of days remaining. Currently feeling well with no back pain or other symptoms. She ambulates with a walker and also a cane. No fevers, chills, sweats, or radiculopathy.    Allergies  Allergen Reactions  . Sulfa Antibiotics Rash      Outpatient Medications Prior to Visit  Medication Sig Dispense Refill  . ACCU-CHEK GUIDE test strip USE TO MONITOR BLOOD GLUCOSE TWO TIMES DAILY    . aspirin 81 MG chewable tablet Chew 81 mg by mouth every morning.     . ferrous sulfate 325 (65 FE) MG tablet Take 1 tablet (325 mg total) by mouth daily with breakfast. 30 tablet 3  . furosemide (LASIX) 20 MG tablet Take 1 tablet (20 mg total) by mouth daily. 30 tablet 2  . insulin glargine (LANTUS) 100 unit/mL SOPN  Inject 0.24 mLs (24 Units total) into the skin daily. 15 mL 11  . insulin glargine (LANTUS) 100 unit/mL SOPN Inject 0.24 mLs (24 Units total) into the skin daily. 15 mL 11  . ketoconazole (NIZORAL) 2 % cream Apply topically 2 (two) times daily. Apply to affected area 15 g 0  . lisinopril (ZESTRIL) 20 MG tablet Take 0.5 tablets (10 mg total) by mouth daily. 30 tablet 2  . metoprolol tartrate (LOPRESSOR) 25 MG tablet Take 1 tablet (25 mg total) by mouth 2 (two) times daily. 60 tablet 0  . Multiple Vitamin (THERA) TABS Take 1 tablet by mouth daily.     . pantoprazole (PROTONIX) 40 MG tablet Take 1 tablet (40 mg total) by mouth daily. 30 tablet 1  . pioglitazone (ACTOS) 30 MG tablet TAKE ONE TABLET (30 MG TOTAL) BY MOUTH DAILY.    Marland Kitchen saccharomyces boulardii (FLORASTOR) 250 MG capsule Take 1 capsule (250 mg total) by mouth 2 (two) times daily. 60 capsule 0  . senna-docusate (SENOKOT-S) 8.6-50 MG tablet Take 2 tablets by mouth at bedtime.    . vitamin B-12 (CYANOCOBALAMIN) 1000 MCG tablet Take by mouth.    Marland Kitchen ceFAZolin (ANCEF) 10 g injection     . cephALEXin (KEFLEX) 500 MG capsule Take 1 capsule (500 mg total) by mouth 3 (three) times daily. Start on 09/06/2018. 90 capsule 0  . methocarbamol (ROBAXIN) 500 MG tablet Take 1 tablet (500 mg total)  by mouth every 6 (six) hours as needed for muscle spasms. (Patient not taking: Reported on 08/28/2018) 60 tablet 0  . acetaminophen (TYLENOL) 325 MG tablet Take 2 tablets (650 mg total) by mouth every 4 (four) hours as needed for mild pain ((score 1 to 3) or temp > 100.5).    Marland Kitchen HYDROcodone-acetaminophen (NORCO) 7.5-325 MG tablet Take 1 tablet by mouth every 6 (six) hours as needed for moderate pain. (Patient not taking: Reported on 08/28/2018) 30 tablet 0   No facility-administered medications prior to visit.      Past Medical History:  Diagnosis Date  . Anemia   . Diabetes mellitus without complication ([place])   . Hyperlipidemia   . Hypertension   . Peripheral  neuropathy      Past Surgical History:  Procedure Laterality Date  . ABDOMINAL HYSTERECTOMY    . IR LUMBAR DISC ASPIRATION W/IMG GUIDE  06/18/2018  . POSTERIOR LUMBAR FUSION 4 LEVEL N/A 07/11/2018   Procedure: Thoracic ten-Lumbar three Fusion with instrumentation-Lumbar one-two Decompressive Laminectomy ;  Surgeon: Eustace Moore, MD;  Location: [place];  Service: Neurosurgery;  Laterality: N/A;  . TEE WITHOUT CARDIOVERSION N/A 07/15/2018   Procedure: TRANSESOPHAGEAL ECHOCARDIOGRAM (TEE);  Surgeon: Lelon Perla, MD;  Location: [facility] ENDOSCOPY;  Service: Cardiovascular;  Laterality: N/A;     Review of Systems  Constitutional: Negative for chills, fatigue and fever.  Eyes:       Negative for changes in vision  Respiratory: Negative for cough, chest tightness and wheezing.   Cardiovascular: Negative for chest pain, palpitations and leg swelling.  Musculoskeletal: Negative for back pain, joint swelling and myalgias.  Skin: Negative for rash.  Neurological: Negative for dizziness, weakness, light-headedness and headaches.      Objective:    Nursing note and vital signs reviewed.    Kaitlin Vargas is pleasant to speak with and sounds to be doing well. She is eager to stop antibiotics when safe.  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 Will recheck TTE. No need for additional antibiotic therapy at this time.       Relevant Orders   ECHOCARDIOGRAM COMPLETE BUBBLE STUDY     Musculoskeletal and Integument   Lumbar discitis    Kaitlin Vargas has lumbar discitis s/p fusion complicated with MSSA abscess, discitis, MSSA bacteremia and aortic valve endocarditis. She has completed 6 weeks of IV therapy on 4/17 and is set to complete 1 month of Keflex. She has no pain  currently and is functioning back to baseline level. Advised to monitor for worsening pain in the future. Complete course of Keflex with no further antibiotics recommended at present time.         Other   MSSA bacteremia - Primary    Kaitlin Vargas had MSSA bacteremia likely as a complication of lumbar discitis. She has completed therapy for discitis and endocarditis. No further treatment needed at present.       Relevant Orders   ECHOCARDIOGRAM COMPLETE BUBBLE STUDY       I have discontinued Kaitlin Vargas's acetaminophen, HYDROcodone-acetaminophen, cephALEXin, and ceFAZolin. I am also having her maintain her aspirin, Thera, senna-docusate, ferrous sulfate, ketoconazole, methocarbamol, metoprolol tartrate, pantoprazole, saccharomyces boulardii, insulin glargine, insulin glargine, vitamin B-12,  pioglitazone, furosemide, lisinopril, and Accu-Chek Guide.   I discussed the assessment and treatment plan with the patient. The patient was provided an opportunity to ask questions and all were answered. The patient agreed with the plan and demonstrated an understanding of the instructions.   The patient was advised to call back or seek an in-person evaluation if the symptoms worsen or if the condition fails to improve as anticipated.   I provided 13  minutes of non-face-to-face time during this encounter.  Follow-up: Return if symptoms worsen or fail to improve.   Terri Piedra, MSN, FNP-C Nurse Practitioner [facility] for Infectious Disease [place] number: [phone]

## 2018-10-02 NOTE — Assessment & Plan Note (Signed)
Ms. Epping had MSSA bacteremia likely as a complication of lumbar discitis. She has completed therapy for discitis and endocarditis. No further treatment needed at present.

## 2018-10-02 NOTE — Assessment & Plan Note (Signed)
Kaitlin Vargas has lumbar discitis s/p fusion complicated with MSSA abscess, discitis, MSSA bacteremia and aortic valve endocarditis. She has completed 6 weeks of IV therapy on 4/17 and is set to complete 1 month of Keflex. She has no pain currently and is functioning back to baseline level. Advised to monitor for worsening pain in the future. Complete course of Keflex with no further antibiotics recommended at present time.

## 2018-10-02 NOTE — Patient Instructions (Signed)
Nice to speak with you.  Please complete your antibiotics as prescribed until completed.   No further antibiotics are needed once completed.  We will schedule you for an echocardiogram and discuss the results once receive them.  Follow up with ID as needed.   Have a great day and stay safe!

## 2018-10-10 ENCOUNTER — Other Ambulatory Visit: Payer: Self-pay

## 2018-10-10 ENCOUNTER — Other Ambulatory Visit: Payer: Self-pay | Admitting: Family

## 2018-10-10 ENCOUNTER — Ambulatory Visit (HOSPITAL_COMMUNITY)
Admission: RE | Admit: 2018-10-10 | Discharge: 2018-10-10 | Disposition: A | Discharge status: Home / Self Care | Payer: Medicare HMO | Source: Ambulatory Visit | Attending: Family | Admitting: Family

## 2018-10-10 DIAGNOSIS — E1110 Type 2 diabetes mellitus with ketoacidosis without coma: Secondary | ICD-10-CM | POA: Diagnosis not present

## 2018-10-10 DIAGNOSIS — B9561 Methicillin susceptible Staphylococcus aureus infection as the cause of diseases classified elsewhere: Secondary | ICD-10-CM

## 2018-10-10 DIAGNOSIS — I10 Essential (primary) hypertension: Secondary | ICD-10-CM | POA: Insufficient documentation

## 2018-10-10 DIAGNOSIS — I358 Other nonrheumatic aortic valve disorders: Secondary | ICD-10-CM | POA: Diagnosis not present

## 2018-10-10 DIAGNOSIS — R000 Tachycardia, unspecified: Secondary | ICD-10-CM | POA: Insufficient documentation

## 2018-10-10 DIAGNOSIS — J40 Bronchitis, not specified as acute or chronic: Secondary | ICD-10-CM | POA: Diagnosis not present

## 2018-10-10 DIAGNOSIS — D649 Anemia, unspecified: Secondary | ICD-10-CM | POA: Diagnosis not present

## 2018-10-10 DIAGNOSIS — R7881 Bacteremia: Secondary | ICD-10-CM

## 2018-10-10 DIAGNOSIS — E118 Type 2 diabetes mellitus with unspecified complications: Secondary | ICD-10-CM | POA: Insufficient documentation

## 2019-06-20 IMAGING — MR MR HEAD WO/W CM
10 of 12 series · 35 of 48 positions shown · IV contrast (agent unspecified)
Comparison: None.

CLINICAL DATA: Encephalopathy.  Rule out infection

EXAM:
MRI HEAD WITHOUT AND WITH CONTRAST
TECHNIQUE: Multiplanar, multiecho pulse sequences of the brain and surrounding
structures were obtained without and with intravenous contrast.
CONTRAST:  7.5 mL Gadovist IV

[Series 4: DWI · axial · 3.0mm · 1.09mm/px · z∈[-38,+106]mm · 9 of 98 slices shown (1 of 4)]
[im 1/98]
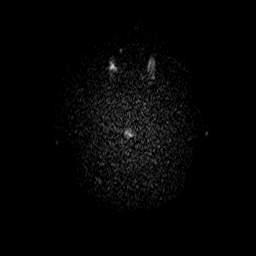
[im 13/98]
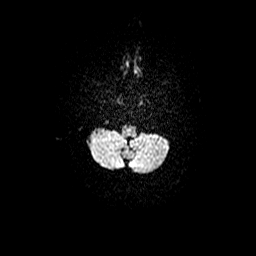
[im 25/98]
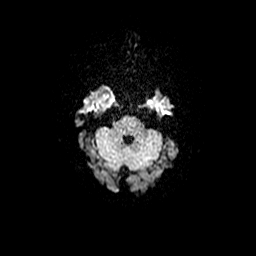
[im 37/98]
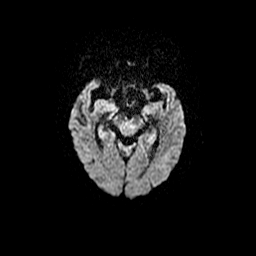
[im 49/98]
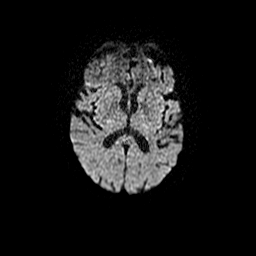
[im 61/98]
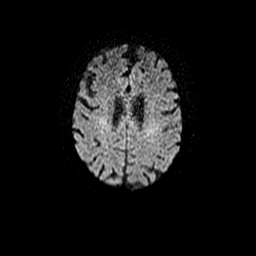
[im 73/98]
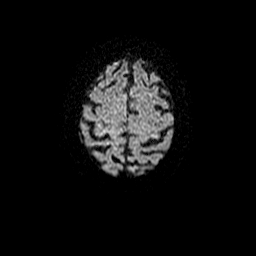
[im 85/98]
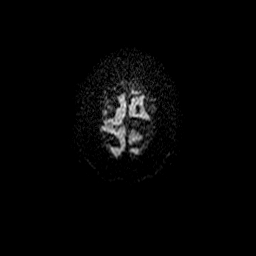
[im 98/98]
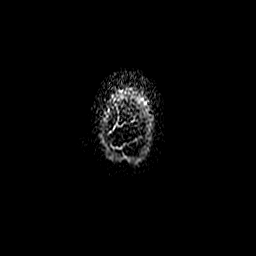

[Series 5: DWI · coronal · 5.0mm · 1.09mm/px · 7 of 72 slices shown (2 of 4)]
[im 1/72]
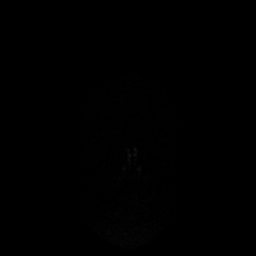
[im 12/72]
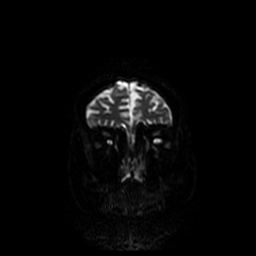
[im 24/72]
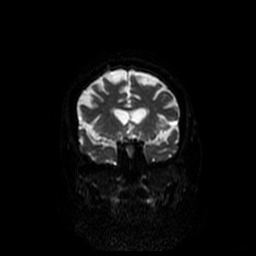
[im 36/72]
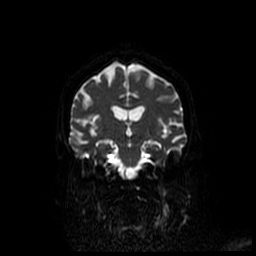
[im 48/72]
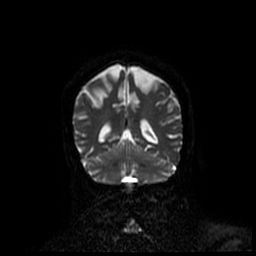
[im 60/72]
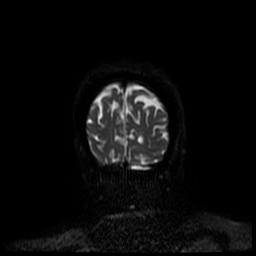
[im 72/72]
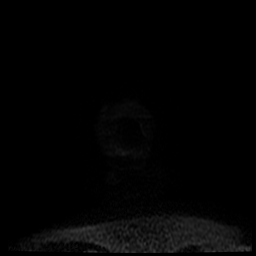

[Series 6: T1 · sagittal · 5.0mm · 0.47mm/px · 2 of 26 slices shown]
[im 1/26]
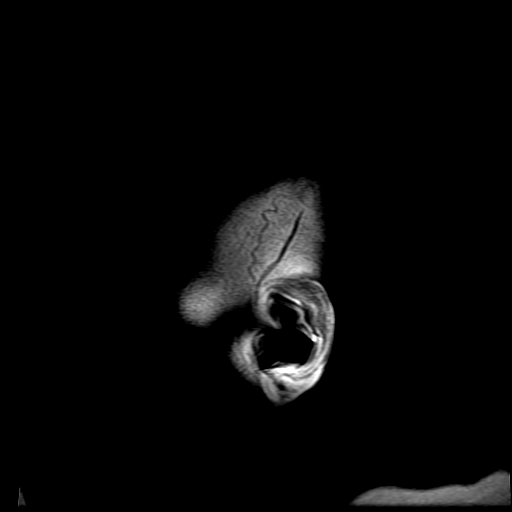
[im 26/26]
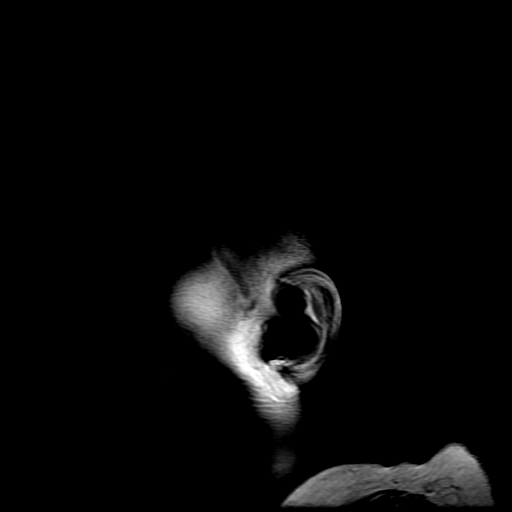

[Series 7: T2 · axial · 5.0mm · 0.43mm/px · z∈[-43,+107]mm · 2 of 26 slices shown]
[im 1/26]
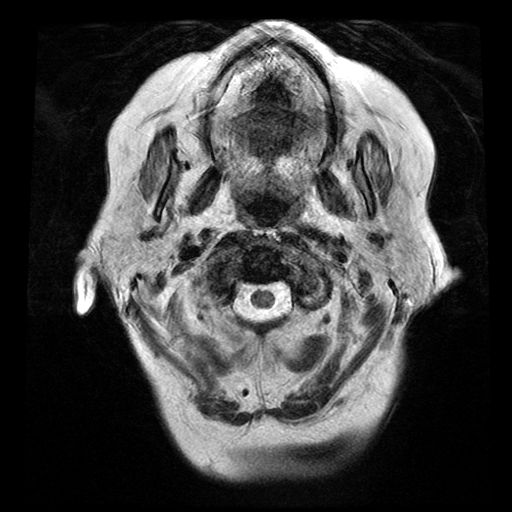
[im 26/26]
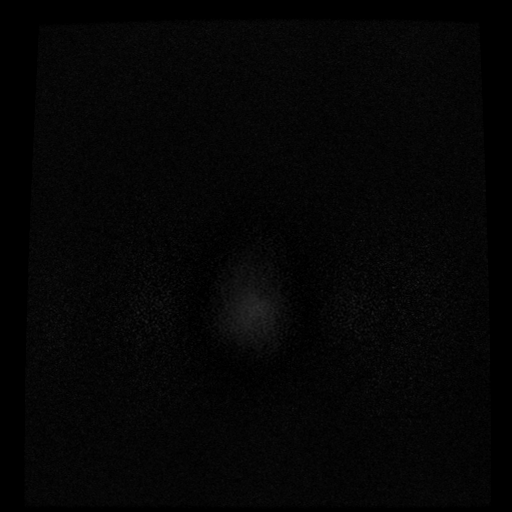

[Series 8: FLAIR · axial · 3.0mm · 0.43mm/px · z∈[-39,+111]mm · 2 of 26 slices shown]
[im 1/26]
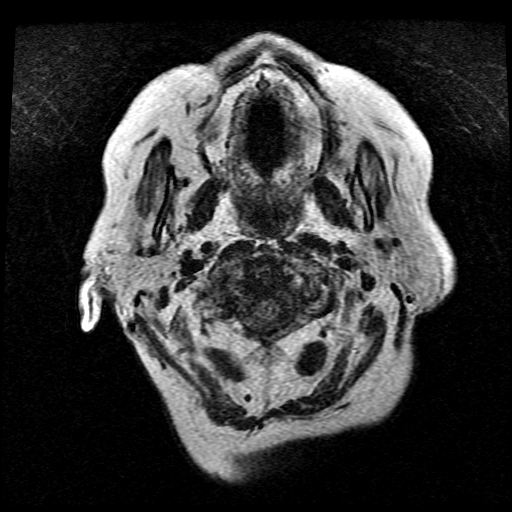
[im 26/26]
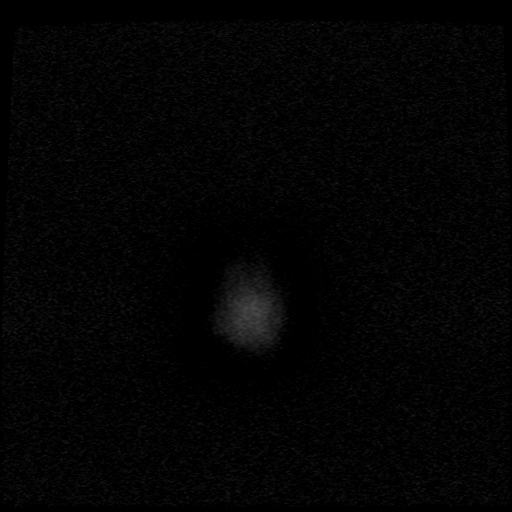

[Series 9: ax mpgr · axial · 5.0mm · 0.43mm/px · z∈[-28,+112]mm · 2 of 21 slices shown]
[im 1/21]
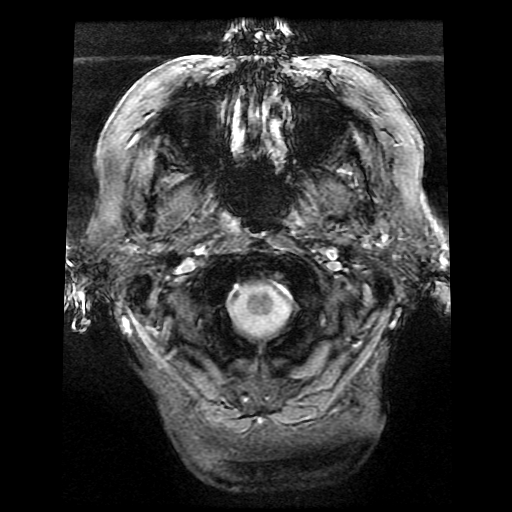
[im 21/21]
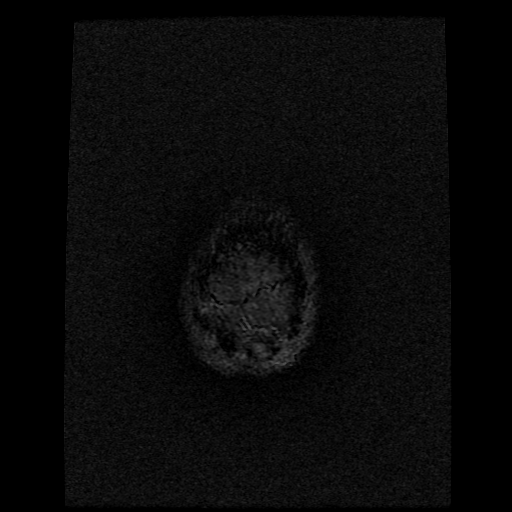

[Series 11: T2 post-contrast · coronal · 5.0mm · 0.39mm/px · 2 of 28 slices shown]
[im 1/28]
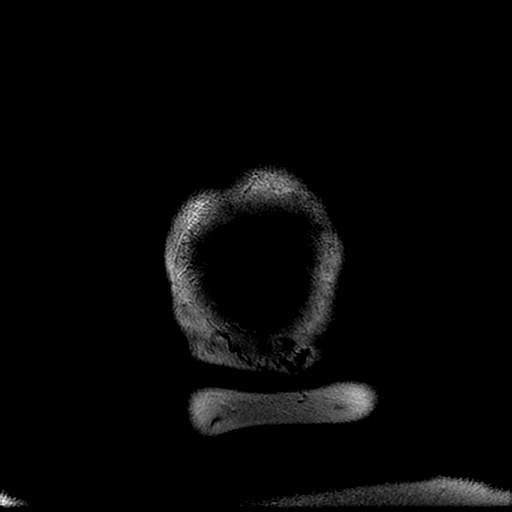
[im 28/28]
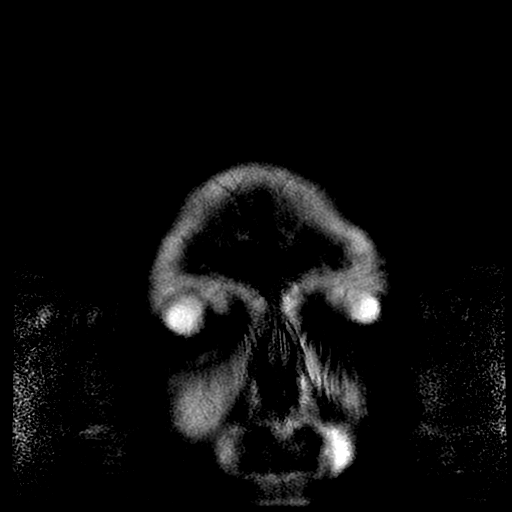

[Series 13: T1 post-contrast · coronal · 5.0mm · 0.39mm/px · 2 of 28 slices shown]
[im 1/28]
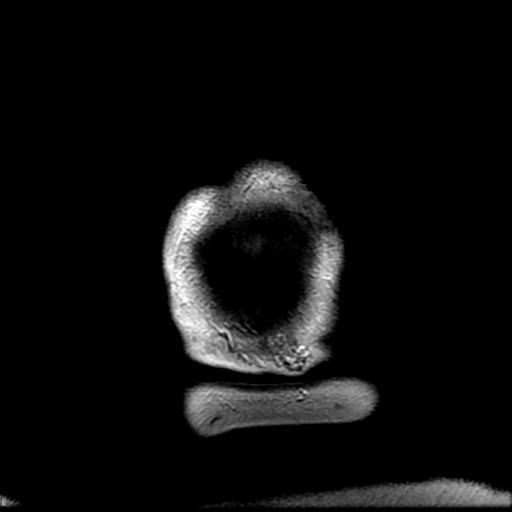
[im 28/28]
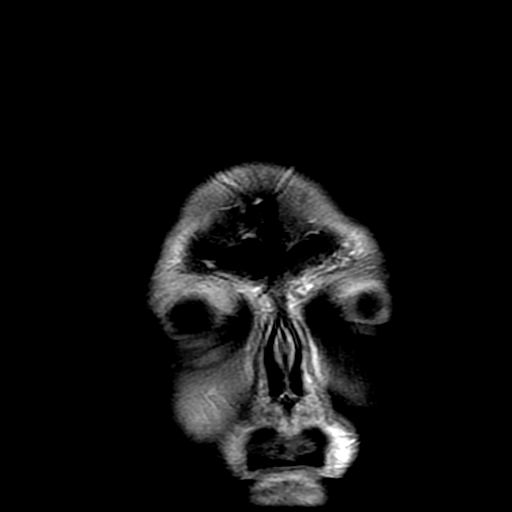

[Series 400: DWI · axial · 3.0mm · 1.09mm/px · z∈[-38,+106]mm · 4 of 49 slices shown (3 of 4)]
[im 1/49]
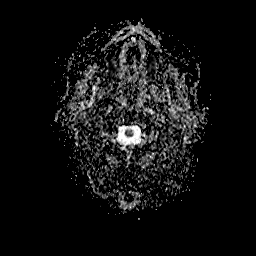
[im 17/49]
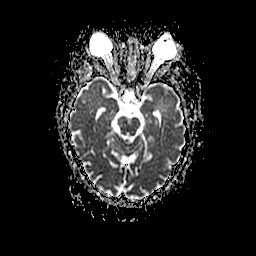
[im 33/49]
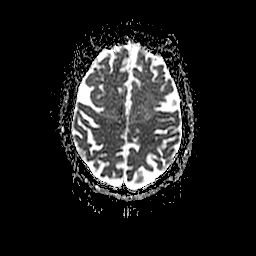
[im 49/49]
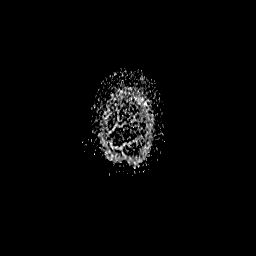

[Series 500: DWI · coronal · 5.0mm · 1.09mm/px · 3 of 36 slices shown (4 of 4)]
[im 1/36]
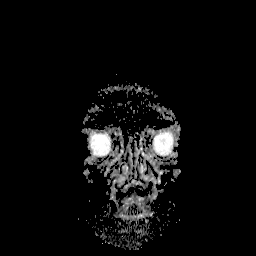
[im 18/36]
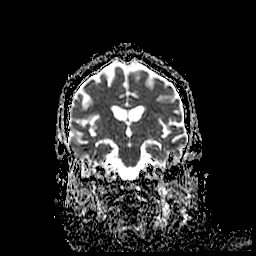
[im 36/36]
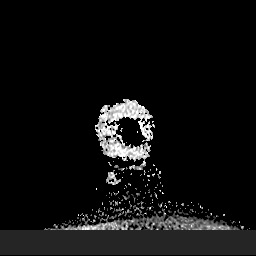

[35 of 48 positions shown; findings below may reference images not displayed]

FINDINGS: Brain: Generalized atrophy without hydrocephalus. Mild chronic
changes in the white matter. No acute infarct. Negative for
hemorrhage or mass. No edema.

Normal enhancement postcontrast infusion. No enhancing mass or
abscess. Leptomeningeal enhancement normal.

Vascular: Normal vascular flow voids.

Skull and upper cervical spine: Negative

Sinuses/Orbits: Mild mucosal edema paranasal sinuses. Bilateral
mastoid effusion. Bilateral cataract surgery

Other: None
IMPRESSION: No acute intracranial abnormality

Mild atrophy and mild chronic microvascular ischemic change in the
white matter.

## 2019-06-21 IMAGING — RF DG C-ARM 61-120 MIN
1 series · 7 of 7 positions shown · non-contrast
Comparison: Lumbar MRI 07/09/2018

CLINICAL DATA: Thoracolumbar fusion.  Spinal infection.

EXAM:
THORACOLUMBAR SPINE 1V

[Series 1: run · 7 of 7 slices shown]
[im 1/7]
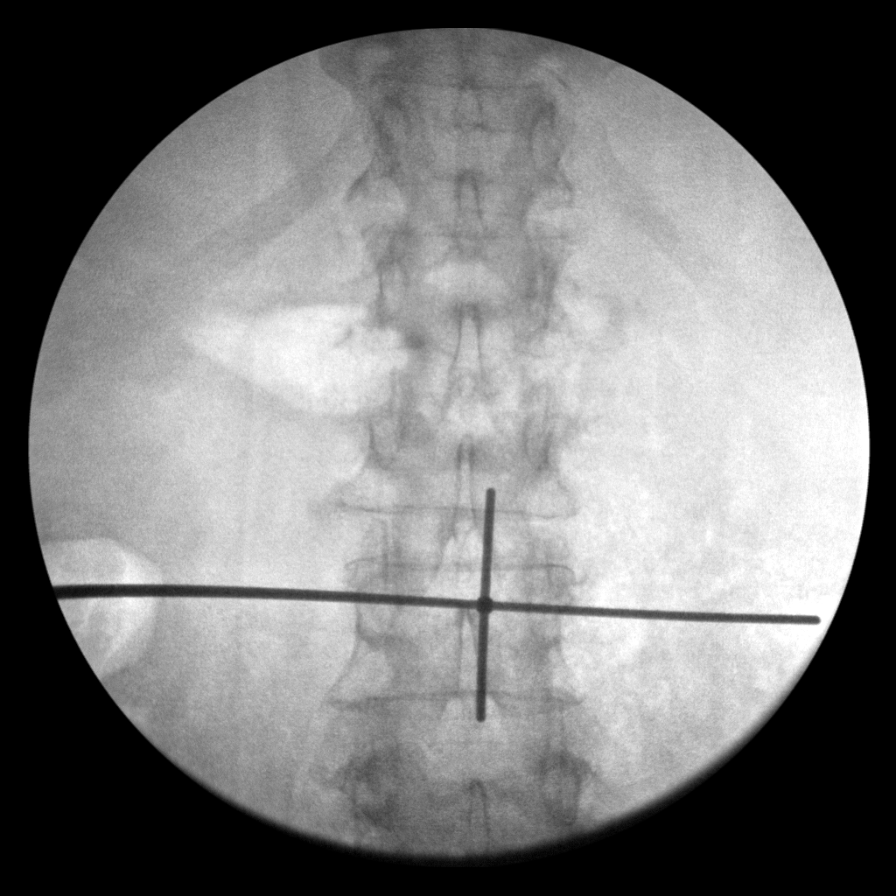
[im 2/7]
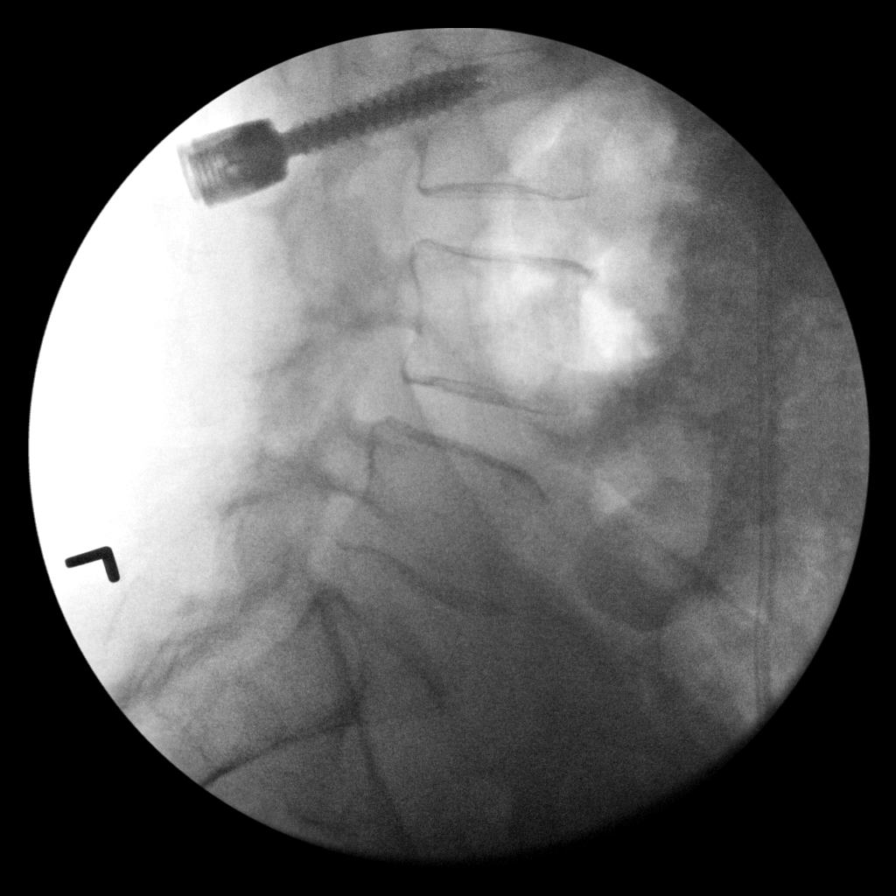
[im 3/7]
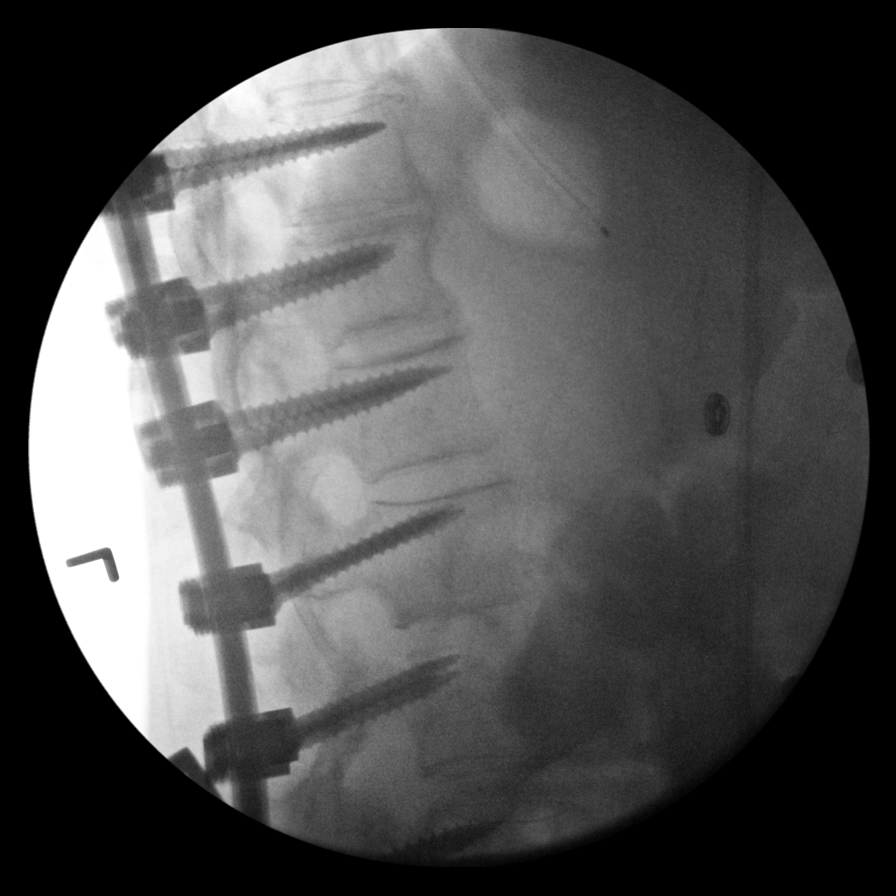
[im 4/7]
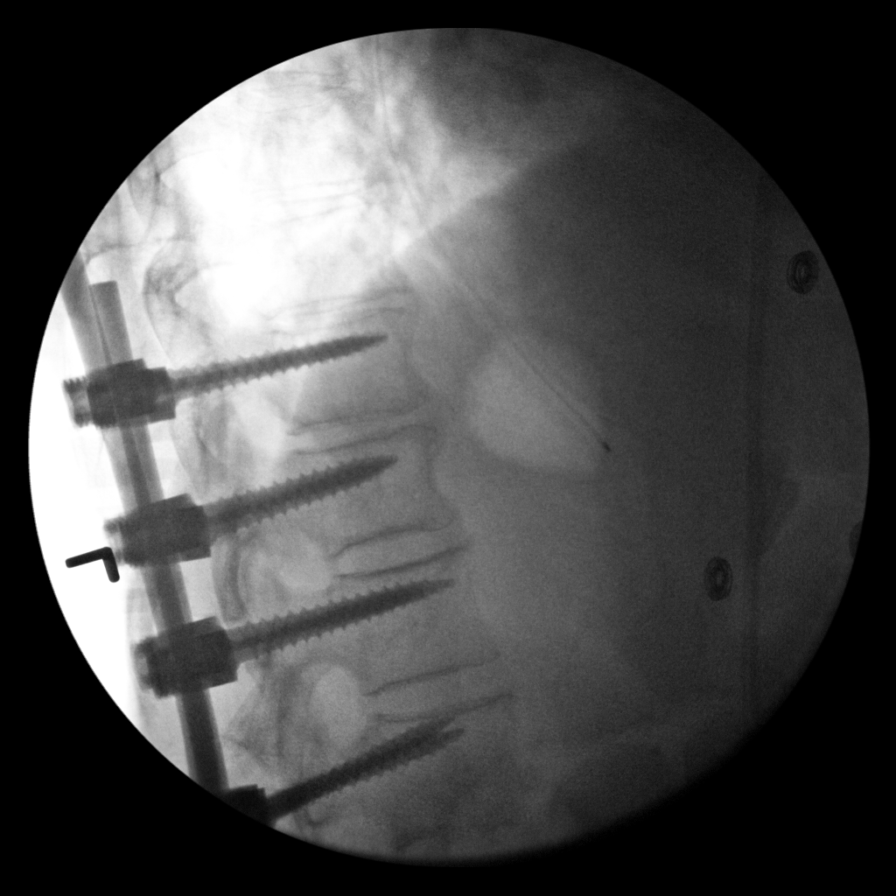
[im 5/7]
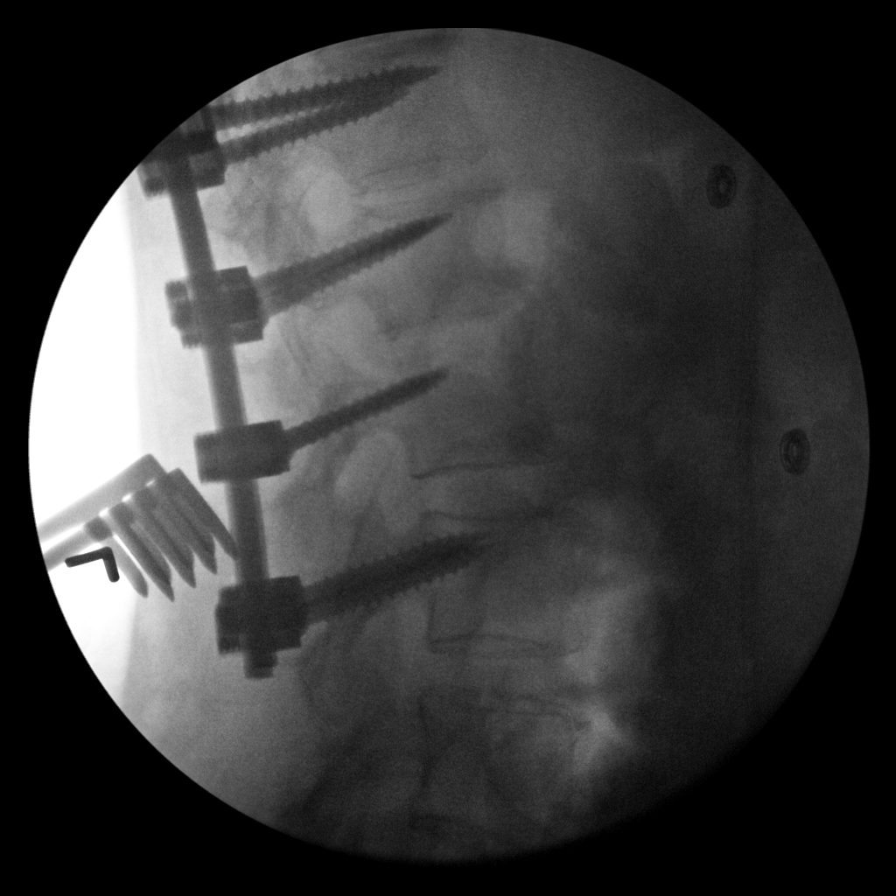
[im 6/7]
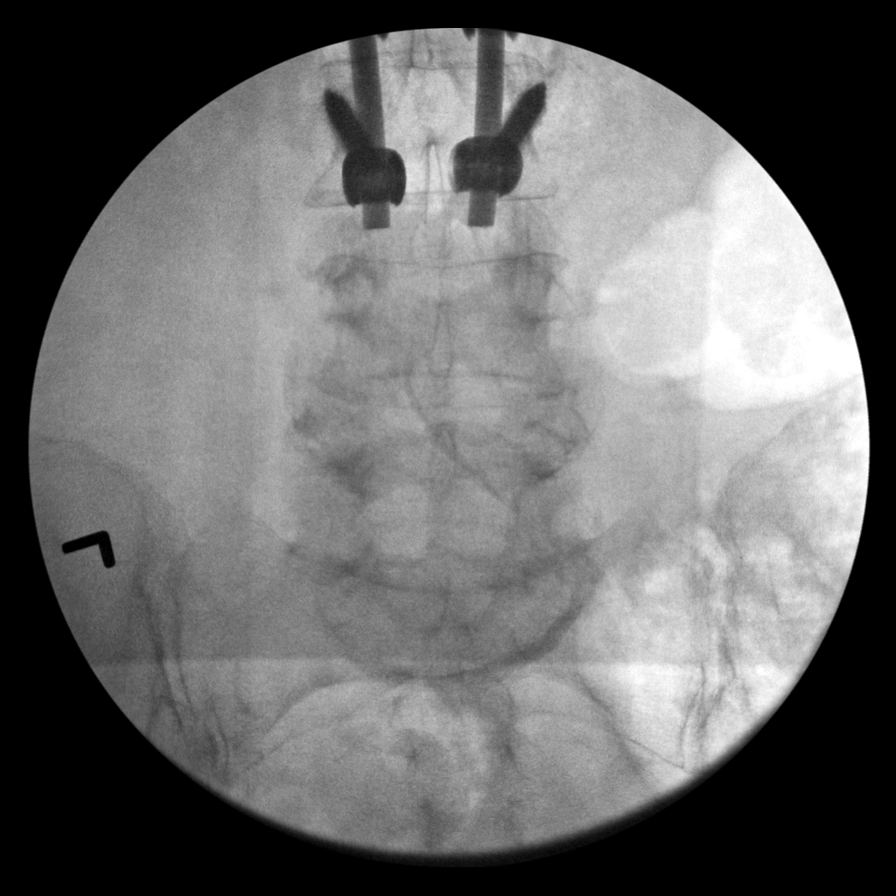
[im 7/7]
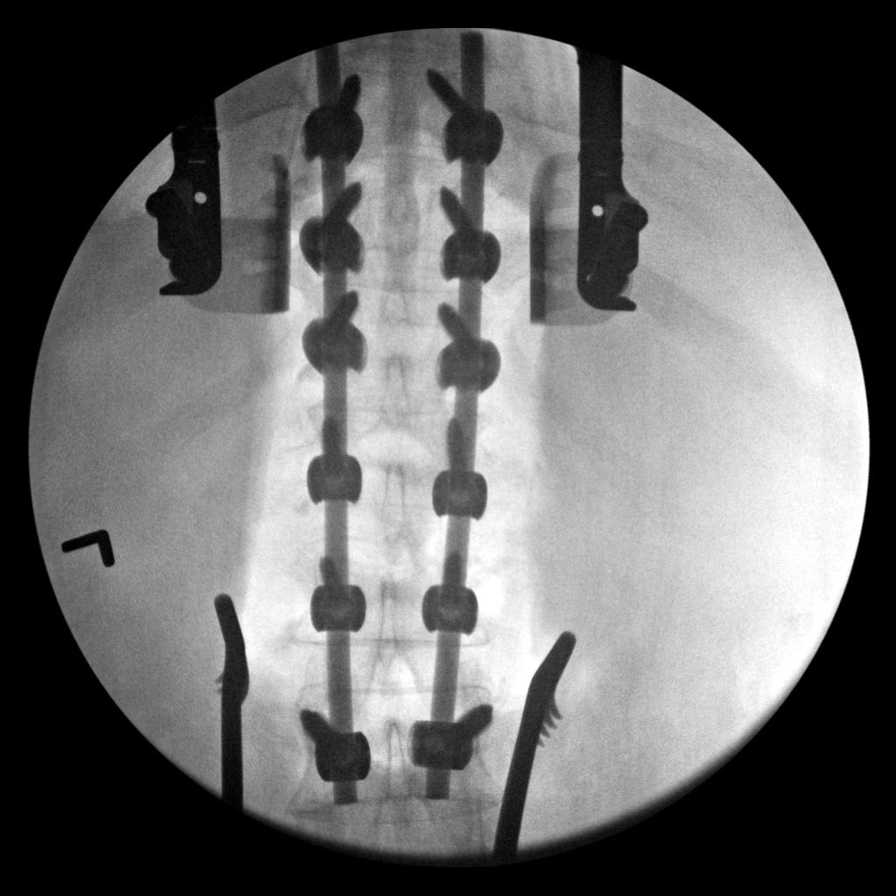

[7 of 7 positions shown; findings below may reference images not displayed]

FINDINGS: C-arm images were obtained of the thoracic and lumbar spine.
Bilateral pedicle screw and posterior rod fusion extending from T10
through L3.

Endplate erosion at L1-2 consistent with infection
IMPRESSION: Pedicle screw and posterior rod fusion T10 through L3.

## 2021-06-14 ENCOUNTER — Other Ambulatory Visit: Payer: Self-pay | Admitting: Internal Medicine

## 2021-06-14 DIAGNOSIS — R928 Other abnormal and inconclusive findings on diagnostic imaging of breast: Secondary | ICD-10-CM

## 2021-06-28 ENCOUNTER — Ambulatory Visit
Admission: RE | Admit: 2021-06-28 | Discharge: 2021-06-28 | Disposition: A | Discharge status: Home / Self Care | Payer: Medicare HMO | Source: Ambulatory Visit | Attending: Internal Medicine | Admitting: Internal Medicine

## 2021-06-28 DIAGNOSIS — R928 Other abnormal and inconclusive findings on diagnostic imaging of breast: Secondary | ICD-10-CM
